# Patient Record
Sex: Female | Born: 1940
Health system: Southern US, Community
[De-identification: ages and names within clinical notes are randomized; demographics above are authoritative.]

## PROBLEM LIST (undated history)

## (undated) DIAGNOSIS — G459 Transient cerebral ischemic attack, unspecified: Secondary | ICD-10-CM

## (undated) DIAGNOSIS — Z7902 Long term (current) use of antithrombotics/antiplatelets: Secondary | ICD-10-CM

## (undated) DIAGNOSIS — Z972 Presence of dental prosthetic device (complete) (partial): Secondary | ICD-10-CM

## (undated) DIAGNOSIS — I5189 Other ill-defined heart diseases: Secondary | ICD-10-CM

## (undated) DIAGNOSIS — I6523 Occlusion and stenosis of bilateral carotid arteries: Secondary | ICD-10-CM

## (undated) DIAGNOSIS — M5136 Other intervertebral disc degeneration, lumbar region: Secondary | ICD-10-CM

## (undated) DIAGNOSIS — R32 Unspecified urinary incontinence: Secondary | ICD-10-CM

## (undated) DIAGNOSIS — L910 Hypertrophic scar: Secondary | ICD-10-CM

## (undated) DIAGNOSIS — T7840XA Allergy, unspecified, initial encounter: Secondary | ICD-10-CM

## (undated) DIAGNOSIS — R7303 Prediabetes: Secondary | ICD-10-CM

## (undated) DIAGNOSIS — I679 Cerebrovascular disease, unspecified: Secondary | ICD-10-CM

## (undated) DIAGNOSIS — E119 Type 2 diabetes mellitus without complications: Secondary | ICD-10-CM

## (undated) DIAGNOSIS — I10 Essential (primary) hypertension: Secondary | ICD-10-CM

## (undated) DIAGNOSIS — G3184 Mild cognitive impairment, so stated: Secondary | ICD-10-CM

## (undated) DIAGNOSIS — F329 Major depressive disorder, single episode, unspecified: Secondary | ICD-10-CM

## (undated) DIAGNOSIS — M5430 Sciatica, unspecified side: Secondary | ICD-10-CM

## (undated) DIAGNOSIS — M51369 Other intervertebral disc degeneration, lumbar region without mention of lumbar back pain or lower extremity pain: Secondary | ICD-10-CM

## (undated) DIAGNOSIS — R001 Bradycardia, unspecified: Secondary | ICD-10-CM

## (undated) DIAGNOSIS — I779 Disorder of arteries and arterioles, unspecified: Secondary | ICD-10-CM

## (undated) DIAGNOSIS — I2510 Atherosclerotic heart disease of native coronary artery without angina pectoris: Secondary | ICD-10-CM

## (undated) DIAGNOSIS — D179 Benign lipomatous neoplasm, unspecified: Secondary | ICD-10-CM

## (undated) DIAGNOSIS — F32A Depression, unspecified: Secondary | ICD-10-CM

## (undated) DIAGNOSIS — I700 Atherosclerosis of aorta: Secondary | ICD-10-CM

## (undated) DIAGNOSIS — I639 Cerebral infarction, unspecified: Secondary | ICD-10-CM

## (undated) DIAGNOSIS — K08109 Complete loss of teeth, unspecified cause, unspecified class: Secondary | ICD-10-CM

## (undated) DIAGNOSIS — E785 Hyperlipidemia, unspecified: Secondary | ICD-10-CM

## (undated) DIAGNOSIS — H25019 Cortical age-related cataract, unspecified eye: Secondary | ICD-10-CM

## (undated) HISTORY — DX: Cerebral infarction, unspecified: I63.9

## (undated) HISTORY — DX: Sciatica, unspecified side: M54.30

## (undated) HISTORY — PX: RECTAL POLYPECTOMY: SHX2309

## (undated) HISTORY — DX: Type 2 diabetes mellitus without complications: E11.9

## (undated) HISTORY — DX: Essential (primary) hypertension: I10

## (undated) HISTORY — DX: Hyperlipidemia, unspecified: E78.5

## (undated) HISTORY — DX: Allergy, unspecified, initial encounter: T78.40XA

## (undated) HISTORY — PX: OTHER SURGICAL HISTORY: SHX169

## (undated) HISTORY — DX: Cerebrovascular disease, unspecified: I67.9

## (undated) HISTORY — DX: Depression, unspecified: F32.A

## (undated) HISTORY — PX: COLONOSCOPY: SHX174

## (undated) HISTORY — DX: Major depressive disorder, single episode, unspecified: F32.9

---

## 1995-10-24 DIAGNOSIS — I639 Cerebral infarction, unspecified: Secondary | ICD-10-CM

## 1995-10-24 HISTORY — DX: Cerebral infarction, unspecified: I63.9

## 2004-12-02 ENCOUNTER — Ambulatory Visit: Payer: Self-pay | Admitting: General Surgery

## 2006-01-04 ENCOUNTER — Ambulatory Visit: Payer: Self-pay | Admitting: General Surgery

## 2007-01-03 ENCOUNTER — Ambulatory Visit: Payer: Self-pay | Admitting: General Surgery

## 2007-04-09 ENCOUNTER — Ambulatory Visit: Payer: Self-pay | Admitting: Gastroenterology

## 2007-08-22 ENCOUNTER — Ambulatory Visit: Payer: Self-pay | Admitting: Family Medicine

## 2007-10-24 HISTORY — PX: GALLBLADDER SURGERY: SHX652

## 2007-10-24 HISTORY — PX: CHOLECYSTECTOMY: SHX55

## 2008-01-09 ENCOUNTER — Ambulatory Visit: Payer: Self-pay | Admitting: General Surgery

## 2008-04-05 ENCOUNTER — Other Ambulatory Visit: Payer: Self-pay

## 2008-04-05 ENCOUNTER — Inpatient Hospital Stay: Payer: Self-pay | Admitting: Vascular Surgery

## 2009-03-17 ENCOUNTER — Ambulatory Visit: Payer: Self-pay | Admitting: Obstetrics and Gynecology

## 2009-03-29 ENCOUNTER — Ambulatory Visit: Payer: Self-pay | Admitting: Obstetrics and Gynecology

## 2010-03-31 ENCOUNTER — Ambulatory Visit: Payer: Self-pay | Admitting: Obstetrics and Gynecology

## 2011-06-24 LAB — HM PAP SMEAR: HM Pap smear: NORMAL

## 2011-06-24 LAB — HM DEXA SCAN: HM DEXA SCAN: NORMAL

## 2011-07-10 ENCOUNTER — Ambulatory Visit: Payer: Self-pay | Admitting: Obstetrics and Gynecology

## 2011-08-01 ENCOUNTER — Ambulatory Visit: Payer: Self-pay | Admitting: Podiatry

## 2011-08-04 ENCOUNTER — Ambulatory Visit: Payer: Self-pay | Admitting: Podiatry

## 2012-07-10 ENCOUNTER — Ambulatory Visit: Payer: Self-pay | Admitting: Obstetrics and Gynecology

## 2013-07-15 ENCOUNTER — Ambulatory Visit: Payer: Self-pay | Admitting: Obstetrics and Gynecology

## 2014-07-07 DIAGNOSIS — H2513 Age-related nuclear cataract, bilateral: Secondary | ICD-10-CM | POA: Insufficient documentation

## 2014-07-22 LAB — LIPID PANEL
Cholesterol: 168 mg/dL (ref 0–200)
HDL: 54 mg/dL (ref 35–70)
LDL Cholesterol: 99 mg/dL
Triglycerides: 74 mg/dL (ref 40–160)

## 2014-07-22 LAB — BASIC METABOLIC PANEL WITH GFR
Creatinine: 1.2 mg/dL — AB (ref <=1.1)
Glucose: 120 mg/dL

## 2014-07-29 ENCOUNTER — Ambulatory Visit: Payer: Self-pay | Admitting: Family Medicine

## 2014-07-29 LAB — HM MAMMOGRAPHY: HM Mammogram: NORMAL

## 2014-09-04 ENCOUNTER — Ambulatory Visit: Payer: Self-pay | Admitting: Gastroenterology

## 2014-10-21 DIAGNOSIS — F32A Depression, unspecified: Secondary | ICD-10-CM | POA: Insufficient documentation

## 2014-10-21 DIAGNOSIS — D128 Benign neoplasm of rectum: Secondary | ICD-10-CM | POA: Insufficient documentation

## 2014-10-21 DIAGNOSIS — F329 Major depressive disorder, single episode, unspecified: Secondary | ICD-10-CM | POA: Insufficient documentation

## 2014-11-03 ENCOUNTER — Emergency Department: Payer: Self-pay | Admitting: Internal Medicine

## 2014-11-03 DIAGNOSIS — N6002 Solitary cyst of left breast: Secondary | ICD-10-CM | POA: Diagnosis not present

## 2014-11-03 DIAGNOSIS — Z880 Allergy status to penicillin: Secondary | ICD-10-CM | POA: Diagnosis not present

## 2014-11-03 DIAGNOSIS — B359 Dermatophytosis, unspecified: Secondary | ICD-10-CM | POA: Diagnosis not present

## 2014-11-03 DIAGNOSIS — N63 Unspecified lump in breast: Secondary | ICD-10-CM | POA: Diagnosis not present

## 2014-11-03 DIAGNOSIS — I10 Essential (primary) hypertension: Secondary | ICD-10-CM | POA: Diagnosis not present

## 2014-11-03 DIAGNOSIS — Z79899 Other long term (current) drug therapy: Secondary | ICD-10-CM | POA: Diagnosis not present

## 2014-11-03 DIAGNOSIS — Z7902 Long term (current) use of antithrombotics/antiplatelets: Secondary | ICD-10-CM | POA: Diagnosis not present

## 2014-11-03 DIAGNOSIS — N644 Mastodynia: Secondary | ICD-10-CM | POA: Diagnosis not present

## 2014-11-03 LAB — CBC WITH DIFFERENTIAL/PLATELET
BASOS PCT: 0.7 %
Basophil #: 0.1 10*3/uL (ref 0.0–0.1)
EOS PCT: 1.1 %
Eosinophil #: 0.1 10*3/uL (ref 0.0–0.7)
HCT: 43.8 % (ref 35.0–47.0)
HGB: 14.3 g/dL (ref 12.0–16.0)
LYMPHS ABS: 2.3 10*3/uL (ref 1.0–3.6)
Lymphocyte %: 19 %
MCH: 31.4 pg (ref 26.0–34.0)
MCHC: 32.6 g/dL (ref 32.0–36.0)
MCV: 96 fL (ref 80–100)
MONO ABS: 0.6 x10 3/mm (ref 0.2–0.9)
Monocyte %: 5.3 %
Neutrophil #: 8.9 10*3/uL — ABNORMAL HIGH (ref 1.4–6.5)
Neutrophil %: 73.9 %
PLATELETS: 258 10*3/uL (ref 150–440)
RBC: 4.55 10*6/uL (ref 3.80–5.20)
RDW: 13.7 % (ref 11.5–14.5)
WBC: 12 10*3/uL — ABNORMAL HIGH (ref 3.6–11.0)

## 2014-11-05 DIAGNOSIS — I517 Cardiomegaly: Secondary | ICD-10-CM | POA: Diagnosis not present

## 2014-11-05 DIAGNOSIS — D369 Benign neoplasm, unspecified site: Secondary | ICD-10-CM | POA: Diagnosis not present

## 2014-11-12 DIAGNOSIS — R198 Other specified symptoms and signs involving the digestive system and abdomen: Secondary | ICD-10-CM | POA: Diagnosis not present

## 2014-11-12 DIAGNOSIS — D128 Benign neoplasm of rectum: Secondary | ICD-10-CM | POA: Diagnosis not present

## 2014-11-12 DIAGNOSIS — D369 Benign neoplasm, unspecified site: Secondary | ICD-10-CM | POA: Diagnosis not present

## 2014-11-14 DIAGNOSIS — I160 Hypertensive urgency: Secondary | ICD-10-CM | POA: Insufficient documentation

## 2014-11-15 DIAGNOSIS — R000 Tachycardia, unspecified: Secondary | ICD-10-CM | POA: Diagnosis not present

## 2014-11-15 DIAGNOSIS — R001 Bradycardia, unspecified: Secondary | ICD-10-CM | POA: Diagnosis not present

## 2014-11-16 DIAGNOSIS — I499 Cardiac arrhythmia, unspecified: Secondary | ICD-10-CM | POA: Diagnosis not present

## 2014-11-17 DIAGNOSIS — N179 Acute kidney failure, unspecified: Secondary | ICD-10-CM | POA: Diagnosis not present

## 2014-11-19 DIAGNOSIS — I10 Essential (primary) hypertension: Secondary | ICD-10-CM | POA: Diagnosis not present

## 2014-11-29 DIAGNOSIS — Z8673 Personal history of transient ischemic attack (TIA), and cerebral infarction without residual deficits: Secondary | ICD-10-CM | POA: Diagnosis not present

## 2014-11-29 DIAGNOSIS — F329 Major depressive disorder, single episode, unspecified: Secondary | ICD-10-CM | POA: Diagnosis not present

## 2014-11-29 DIAGNOSIS — Z9049 Acquired absence of other specified parts of digestive tract: Secondary | ICD-10-CM | POA: Diagnosis not present

## 2014-11-29 DIAGNOSIS — I10 Essential (primary) hypertension: Secondary | ICD-10-CM | POA: Diagnosis not present

## 2014-11-29 DIAGNOSIS — I4940 Unspecified premature depolarization: Secondary | ICD-10-CM | POA: Diagnosis not present

## 2014-11-29 DIAGNOSIS — K91840 Postprocedural hemorrhage and hematoma of a digestive system organ or structure following a digestive system procedure: Secondary | ICD-10-CM | POA: Diagnosis not present

## 2014-11-29 DIAGNOSIS — Z87891 Personal history of nicotine dependence: Secondary | ICD-10-CM | POA: Diagnosis not present

## 2014-11-29 DIAGNOSIS — E669 Obesity, unspecified: Secondary | ICD-10-CM | POA: Diagnosis not present

## 2014-11-29 DIAGNOSIS — K625 Hemorrhage of anus and rectum: Secondary | ICD-10-CM | POA: Diagnosis not present

## 2014-11-29 DIAGNOSIS — T45525A Adverse effect of antithrombotic drugs, initial encounter: Secondary | ICD-10-CM | POA: Diagnosis not present

## 2014-11-29 DIAGNOSIS — K922 Gastrointestinal hemorrhage, unspecified: Secondary | ICD-10-CM | POA: Diagnosis not present

## 2014-12-08 DIAGNOSIS — D128 Benign neoplasm of rectum: Secondary | ICD-10-CM | POA: Diagnosis not present

## 2014-12-09 DIAGNOSIS — I10 Essential (primary) hypertension: Secondary | ICD-10-CM | POA: Diagnosis not present

## 2014-12-15 ENCOUNTER — Ambulatory Visit: Payer: Self-pay | Admitting: Family Medicine

## 2014-12-15 DIAGNOSIS — N63 Unspecified lump in breast: Secondary | ICD-10-CM | POA: Diagnosis not present

## 2014-12-15 DIAGNOSIS — N644 Mastodynia: Secondary | ICD-10-CM | POA: Diagnosis not present

## 2014-12-15 DIAGNOSIS — N6002 Solitary cyst of left breast: Secondary | ICD-10-CM | POA: Diagnosis not present

## 2014-12-16 DIAGNOSIS — I10 Essential (primary) hypertension: Secondary | ICD-10-CM | POA: Diagnosis not present

## 2014-12-18 DIAGNOSIS — Z23 Encounter for immunization: Secondary | ICD-10-CM | POA: Diagnosis not present

## 2014-12-22 DIAGNOSIS — D128 Benign neoplasm of rectum: Secondary | ICD-10-CM | POA: Diagnosis not present

## 2015-02-16 DIAGNOSIS — F339 Major depressive disorder, recurrent, unspecified: Secondary | ICD-10-CM | POA: Insufficient documentation

## 2015-02-16 DIAGNOSIS — Z8679 Personal history of other diseases of the circulatory system: Secondary | ICD-10-CM | POA: Insufficient documentation

## 2015-02-16 DIAGNOSIS — F410 Panic disorder [episodic paroxysmal anxiety] without agoraphobia: Secondary | ICD-10-CM | POA: Insufficient documentation

## 2015-02-16 DIAGNOSIS — E7849 Other hyperlipidemia: Secondary | ICD-10-CM | POA: Insufficient documentation

## 2015-02-16 DIAGNOSIS — I10 Essential (primary) hypertension: Secondary | ICD-10-CM | POA: Insufficient documentation

## 2015-03-17 DIAGNOSIS — I10 Essential (primary) hypertension: Secondary | ICD-10-CM | POA: Diagnosis not present

## 2015-03-17 DIAGNOSIS — F339 Major depressive disorder, recurrent, unspecified: Secondary | ICD-10-CM | POA: Diagnosis not present

## 2015-03-17 DIAGNOSIS — I672 Cerebral atherosclerosis: Secondary | ICD-10-CM | POA: Diagnosis not present

## 2015-03-17 DIAGNOSIS — E784 Other hyperlipidemia: Secondary | ICD-10-CM | POA: Diagnosis not present

## 2015-07-23 ENCOUNTER — Ambulatory Visit (INDEPENDENT_AMBULATORY_CARE_PROVIDER_SITE_OTHER): Payer: Commercial Managed Care - HMO | Admitting: Family Medicine

## 2015-07-23 ENCOUNTER — Encounter: Payer: Self-pay | Admitting: Family Medicine

## 2015-07-23 VITALS — BP 120/70 | HR 64 | Ht 67.0 in | Wt 205.0 lb

## 2015-07-23 DIAGNOSIS — E663 Overweight: Secondary | ICD-10-CM

## 2015-07-23 DIAGNOSIS — I679 Cerebrovascular disease, unspecified: Secondary | ICD-10-CM | POA: Diagnosis not present

## 2015-07-23 DIAGNOSIS — I10 Essential (primary) hypertension: Secondary | ICD-10-CM | POA: Diagnosis not present

## 2015-07-23 DIAGNOSIS — Z23 Encounter for immunization: Secondary | ICD-10-CM | POA: Diagnosis not present

## 2015-07-23 DIAGNOSIS — E7849 Other hyperlipidemia: Secondary | ICD-10-CM

## 2015-07-23 DIAGNOSIS — F339 Major depressive disorder, recurrent, unspecified: Secondary | ICD-10-CM

## 2015-07-23 DIAGNOSIS — I6789 Other cerebrovascular disease: Secondary | ICD-10-CM | POA: Insufficient documentation

## 2015-07-23 DIAGNOSIS — E784 Other hyperlipidemia: Secondary | ICD-10-CM

## 2015-07-23 DIAGNOSIS — Z8679 Personal history of other diseases of the circulatory system: Secondary | ICD-10-CM | POA: Diagnosis not present

## 2015-07-23 MED ORDER — CLOPIDOGREL BISULFATE 75 MG PO TABS: 75.0000 mg | ORAL_TABLET | Freq: Every day | ORAL | Status: DC

## 2015-07-23 MED ORDER — FEXOFENADINE-PSEUDOEPHED ER 180-240 MG PO TB24: 1.0000 | ORAL_TABLET | Freq: Every day | ORAL | Status: DC

## 2015-07-23 MED ORDER — LOSARTAN POTASSIUM-HCTZ 100-25 MG PO TABS: 1.0000 | ORAL_TABLET | Freq: Every day | ORAL | Status: DC

## 2015-07-23 MED ORDER — METOPROLOL SUCCINATE ER 100 MG PO TB24: 100.0000 mg | ORAL_TABLET | Freq: Every day | ORAL | Status: DC

## 2015-07-23 MED ORDER — LOVASTATIN 20 MG PO TABS: 40.0000 mg | ORAL_TABLET | Freq: Every day | ORAL | Status: DC

## 2015-07-23 MED ORDER — VENLAFAXINE HCL ER 75 MG PO CP24: 75.0000 mg | ORAL_CAPSULE | Freq: Every day | ORAL | Status: DC

## 2015-07-23 NOTE — Patient Instructions (Signed)

## 2015-07-23 NOTE — Progress Notes (Signed)
Name: Karina Leonard   MRN: [id number]    DOB: 12/12/1940   Date:07/23/2015       Progress Note  Subjective  Chief Complaint  Chief Complaint  Patient presents with  . Hyperlipidemia  . Hypertension  . Depression  . Cerebrovascular Accident    takes Plavix for this    Hyperlipidemia This is a chronic problem. The current episode started more than 1 year ago. She has no history of chronic renal disease, diabetes, hypothyroidism, liver disease or obesity. Factors aggravating her hyperlipidemia include thiazides. Pertinent negatives include no chest pain, focal sensory loss, focal weakness, leg pain, myalgias or shortness of breath. Current antihyperlipidemic treatment includes statins and diet change. There are no compliance problems.  Risk factors for coronary artery disease include hypertension, post-menopausal and obesity.  Hypertension This is a chronic problem. The current episode started more than 1 year ago. The problem has been gradually worsening since onset. The problem is controlled. Associated symptoms include anxiety. Pertinent negatives include no blurred vision, chest pain, headaches, malaise/fatigue, neck pain, palpitations or shortness of breath. Risk factors for coronary artery disease include dyslipidemia. Past treatments include angiotensin blockers, beta blockers and diuretics. The current treatment provides moderate improvement. There are no compliance problems.  Hypertensive end-organ damage includes CVA. There is no history of angina, kidney disease, CAD/MI, heart failure, left ventricular hypertrophy, PVD or renovascular disease. There is no history of chronic renal disease.  Depression        This is a chronic problem.  The current episode started more than 1 year ago.   The onset quality is gradual.   The problem has been gradually improving since onset.  Associated symptoms include no decreased concentration, does not have insomnia, no decreased interest, no myalgias, no  headaches, not sad and no suicidal ideas.     The symptoms are aggravated by nothing.  Past treatments include SNRIs - Serotonin and norepinephrine reuptake inhibitors.  Compliance with treatment is good.  Past medical history includes anxiety.     Pertinent negatives include no chronic pain, no fibromyalgia, no hypothyroidism and no dementia. Cerebrovascular Accident This is a chronic problem. The current episode started more than 1 year ago. The problem has been gradually improving. Pertinent negatives include no abdominal pain, chest pain, chills, coughing, fever, headaches, myalgias, nausea, neck pain, numbness, rash or sore throat. The treatment provided mild relief.    No problem-specific assessment & plan notes found for this encounter.   Past Medical History  Diagnosis Date  . Depression   . Hyperlipidemia   . Hypertension   . Stroke   . Allergy     Past Surgical History  Procedure Laterality Date  . Gallbladder surgery  10/24/2007  . Polyp removed      rectum- benign    Family History  Problem Relation Age of Onset  . Cancer Mother   . Cancer Father     Social History   Social History  . Marital Status: Married    Spouse Name: N/A  . Number of Children: N/A  . Years of Education: N/A   Occupational History  . Not on file.   Social History Main Topics  . Smoking status: Former Research scientist (life sciences)  . Smokeless tobacco: Not on file  . Alcohol Use: 0.0 oz/week    0 Standard drinks or equivalent per week  . Drug Use: No  . Sexual Activity: No   Other Topics Concern  . Not on file   Social History Narrative  Allergies  Allergen Reactions  . Amoxicillin Hives  . Penicillins      Review of Systems  Constitutional: Negative for fever, chills, weight loss and malaise/fatigue.  HENT: Negative for ear discharge, ear pain and sore throat.   Eyes: Negative for blurred vision.  Respiratory: Negative for cough, sputum production, shortness of breath and wheezing.    Cardiovascular: Negative for chest pain, palpitations and leg swelling.  Gastrointestinal: Negative for heartburn, nausea, abdominal pain, diarrhea, constipation, blood in stool and melena.  Genitourinary: Negative for dysuria, urgency, frequency and hematuria.  Musculoskeletal: Negative for myalgias, back pain, joint pain and neck pain.  Skin: Negative for rash.  Neurological: Negative for dizziness, tingling, sensory change, focal weakness, numbness and headaches.  Endo/Heme/Allergies: Negative for environmental allergies and polydipsia. Does not bruise/bleed easily.  Psychiatric/Behavioral: Positive for depression. Negative for suicidal ideas and decreased concentration. The patient is not nervous/anxious and does not have insomnia.      Objective  Filed Vitals:   07/23/15 0946  BP: 120/70  Pulse: 64  Height: 5\' 7"  (1.702 m)  Weight: 205 lb (92.987 kg)    Physical Exam  Constitutional: She is well-developed, well-nourished, and in no distress. No distress.  HENT:  Head: Normocephalic and atraumatic.  Right Ear: External ear normal.  Left Ear: External ear normal.  Nose: Nose normal.  Mouth/Throat: Oropharynx is clear and moist.  Eyes: Conjunctivae and EOM are normal. Pupils are equal, round, and reactive to light. Right eye exhibits no discharge. Left eye exhibits no discharge.  Neck: Normal range of motion. Neck supple. No JVD present. No thyromegaly present.  Cardiovascular: Normal rate, regular rhythm, normal heart sounds and intact distal pulses.  Exam reveals no gallop and no friction rub.   No murmur heard. Pulmonary/Chest: Effort normal and breath sounds normal.  Abdominal: Soft. Bowel sounds are normal. She exhibits no mass. There is no tenderness. There is no guarding.  Musculoskeletal: Normal range of motion. She exhibits no edema.  Lymphadenopathy:    She has no cervical adenopathy.  Neurological: She is alert.  Skin: Skin is warm and dry. She is not  diaphoretic.  Psychiatric: Mood and affect normal.      Assessment & Plan  Problem List Items Addressed This Visit      Cardiovascular and Mediastinum   Essential (primary) hypertension - Primary   Relevant Medications   losartan-hydrochlorothiazide (HYZAAR) 100-25 MG tablet   lovastatin (MEVACOR) 20 MG tablet   metoprolol succinate (TOPROL-XL) 100 MG 24 hr tablet   Other Relevant Orders   Renal Function Panel   Cerebral microvascular disease   Relevant Medications   clopidogrel (PLAVIX) 75 MG tablet   losartan-hydrochlorothiazide (HYZAAR) 100-25 MG tablet   lovastatin (MEVACOR) 20 MG tablet   metoprolol succinate (TOPROL-XL) 100 MG 24 hr tablet     Other   Familial multiple lipoprotein-type hyperlipidemia   Relevant Medications   losartan-hydrochlorothiazide (HYZAAR) 100-25 MG tablet   lovastatin (MEVACOR) 20 MG tablet   metoprolol succinate (TOPROL-XL) 100 MG 24 hr tablet   Other Relevant Orders   Lipid Profile   Recurrent major depressive episodes   Relevant Medications   venlafaxine XR (EFFEXOR-XR) 75 MG 24 hr capsule   H/O: HTN (hypertension)   Relevant Medications   losartan-hydrochlorothiazide (HYZAAR) 100-25 MG tablet   metoprolol succinate (TOPROL-XL) 100 MG 24 hr tablet    Other Visit Diagnoses    Overweight        Need for influenza vaccination  Relevant Orders    Flu Vaccine QUAD 36+ mos PF IM (Fluarix & Fluzone Quad PF) (Completed)    Need for Tdap vaccination        Relevant Orders    Tdap vaccine greater than or equal to 7yo IM (Completed)         Dr. [facility]  07/23/2015

## 2015-07-24 LAB — LIPID PANEL
CHOL/HDL RATIO: 3.1 ratio (ref 0.0–4.4)
Cholesterol, Total: 167 mg/dL (ref 100–199)
HDL: 54 mg/dL (ref >39)
LDL CALC: 93 mg/dL (ref 0–99)
Triglycerides: 102 mg/dL (ref 0–149)
VLDL CHOLESTEROL CAL: 20 mg/dL (ref 5–40)

## 2015-07-24 LAB — RENAL FUNCTION PANEL
ALBUMIN: 3.7 g/dL (ref 3.5–4.8)
BUN / CREAT RATIO: 15 (ref 11–26)
BUN: 19 mg/dL (ref 8–27)
CO2: 26 mmol/L (ref 18–29)
Calcium: 8.4 mg/dL — ABNORMAL LOW (ref 8.7–10.3)
Chloride: 101 mmol/L (ref 97–108)
Creatinine, Ser: 1.24 mg/dL — ABNORMAL HIGH (ref 0.57–1.00)
GFR, EST AFRICAN AMERICAN: 50 mL/min/{1.73_m2} — AB (ref >59)
GFR, EST NON AFRICAN AMERICAN: 43 mL/min/{1.73_m2} — AB (ref >59)
Glucose: 79 mg/dL (ref 65–99)
Phosphorus: 3.3 mg/dL (ref 2.5–4.5)
Potassium: 4.3 mmol/L (ref 3.5–5.2)
Sodium: 143 mmol/L (ref 134–144)

## 2015-07-26 ENCOUNTER — Encounter: Payer: Self-pay | Admitting: Family Medicine

## 2015-07-26 DIAGNOSIS — E1121 Type 2 diabetes mellitus with diabetic nephropathy: Secondary | ICD-10-CM | POA: Insufficient documentation

## 2015-12-22 ENCOUNTER — Other Ambulatory Visit: Payer: Self-pay | Admitting: Family Medicine

## 2016-01-19 ENCOUNTER — Encounter: Payer: Self-pay | Admitting: Family Medicine

## 2016-01-19 ENCOUNTER — Ambulatory Visit (INDEPENDENT_AMBULATORY_CARE_PROVIDER_SITE_OTHER): Payer: Commercial Managed Care - HMO | Admitting: Family Medicine

## 2016-01-19 VITALS — BP 138/80 | HR 76 | Ht 67.0 in | Wt 214.0 lb

## 2016-01-19 DIAGNOSIS — E784 Other hyperlipidemia: Secondary | ICD-10-CM

## 2016-01-19 DIAGNOSIS — I679 Cerebrovascular disease, unspecified: Secondary | ICD-10-CM | POA: Diagnosis not present

## 2016-01-19 DIAGNOSIS — F339 Major depressive disorder, recurrent, unspecified: Secondary | ICD-10-CM | POA: Diagnosis not present

## 2016-01-19 DIAGNOSIS — I10 Essential (primary) hypertension: Secondary | ICD-10-CM

## 2016-01-19 DIAGNOSIS — E7849 Other hyperlipidemia: Secondary | ICD-10-CM

## 2016-01-19 DIAGNOSIS — I6789 Other cerebrovascular disease: Secondary | ICD-10-CM

## 2016-01-19 DIAGNOSIS — Z8679 Personal history of other diseases of the circulatory system: Secondary | ICD-10-CM

## 2016-01-19 MED ORDER — METOPROLOL SUCCINATE ER 100 MG PO TB24: 100.0000 mg | ORAL_TABLET | Freq: Every day | ORAL | Status: DC

## 2016-01-19 MED ORDER — VENLAFAXINE HCL ER 75 MG PO CP24: 75.0000 mg | ORAL_CAPSULE | Freq: Every day | ORAL | Status: DC

## 2016-01-19 MED ORDER — CLOPIDOGREL BISULFATE 75 MG PO TABS: 75.0000 mg | ORAL_TABLET | Freq: Every day | ORAL | Status: DC

## 2016-01-19 MED ORDER — LOVASTATIN 20 MG PO TABS: 40.0000 mg | ORAL_TABLET | Freq: Every day | ORAL | Status: DC

## 2016-01-19 MED ORDER — LOSARTAN POTASSIUM-HCTZ 100-25 MG PO TABS: 1.0000 | ORAL_TABLET | Freq: Every day | ORAL | Status: DC

## 2016-01-19 NOTE — Progress Notes (Signed)
Patient: Karina Leonard, Female    DOB: 1941/09/05, 75 y.o.   MRN: [id number] Visit Date: 01/19/2016  Today's Provider: Otilio Miu, MD   Chief Complaint  Patient presents with  . medicare annual wellness  . Depression  . Hyperlipidemia  . Hypertension   Subjective:   Initial preventative physical exam Karina Leonard is a 75 y.o. female who presents today for her Initial Preventative Physical Exam. She feels well. She reports exercising .Marland Kitchen She reports she is sleeping well.  Depression        The patient presents with no depression.  This is a chronic problem.  The current episode started more than 1 year ago.   The problem occurs intermittently.  The problem has been gradually improving since onset.  Associated symptoms include no decreased concentration, no fatigue, no helplessness, no hopelessness, does not have insomnia, not irritable, no restlessness, no decreased interest, no appetite change, no body aches, no myalgias, no headaches, no indigestion, not sad and no suicidal ideas.     The symptoms are aggravated by nothing.  Compliance with treatment is good.  Previous treatment provided mild relief.   Pertinent negatives include no hypothyroidism, no anxiety and no depression.  (hx of cva) Hyperlipidemia This is a chronic problem. The current episode started more than 1 year ago. The problem is controlled. Recent lipid tests were reviewed and are normal. She has no history of chronic renal disease, diabetes, hypothyroidism, liver disease, obesity or nephrotic syndrome. Factors aggravating her hyperlipidemia include thiazides. Pertinent negatives include no myalgias or shortness of breath. Current antihyperlipidemic treatment includes statins. The current treatment provides mild improvement of lipids. There are no compliance problems.  There are no known risk factors for coronary artery disease.  Hypertension This is a chronic problem. The current episode started more than 1 year ago. The  problem is unchanged. Pertinent negatives include no anxiety, blurred vision, headaches, palpitations or shortness of breath. Past treatments include diuretics, beta blockers and angiotensin blockers. Hypertensive end-organ damage includes CVA. There is no history of angina, kidney disease, CAD/MI, heart failure, left ventricular hypertrophy, PVD, renovascular disease or retinopathy. There is no history of chronic renal disease or a hypertension causing med.    Review of Systems  Constitutional: Negative.  Negative for fever, chills, appetite change, fatigue and unexpected weight change.  HENT: Negative for congestion, ear discharge, ear pain, rhinorrhea, sinus pressure, sneezing and sore throat.   Eyes: Negative for blurred vision, photophobia, pain, discharge, redness and itching.  Respiratory: Negative for cough, shortness of breath, wheezing and stridor.   Cardiovascular: Negative for palpitations.  Gastrointestinal: Negative for nausea, vomiting, abdominal pain, diarrhea, constipation and blood in stool.  Endocrine: Negative for cold intolerance, heat intolerance, polydipsia, polyphagia and polyuria.  Genitourinary: Negative for dysuria, urgency, frequency, hematuria, flank pain, vaginal bleeding, vaginal discharge, menstrual problem and pelvic pain.  Musculoskeletal: Negative for myalgias, back pain and arthralgias.  Skin: Negative for rash.  Allergic/Immunologic: Negative for environmental allergies and food allergies.  Neurological: Negative for dizziness, weakness, light-headedness, numbness and headaches.  Hematological: Negative for adenopathy. Does not bruise/bleed easily.  Psychiatric/Behavioral: Positive for depression. Negative for suicidal ideas, dysphoric mood and decreased concentration. The patient is not nervous/anxious and does not have insomnia.     Social History   Social History  . Marital Status: Married    Spouse Name: N/A  . Number of Children: N/A  . Years of  Education: N/A   Occupational History  . Not on file.  Social History Main Topics  . Smoking status: Former Research scientist (life sciences)  . Smokeless tobacco: Not on file  . Alcohol Use: 0.0 oz/week    0 Standard drinks or equivalent per week  . Drug Use: No  . Sexual Activity: No   Other Topics Concern  . Not on file   Social History Narrative    Patient Active Problem List   Diagnosis Date Noted  . Renal disease due to diabetes mellitus ([place]) 07/26/2015  . Cerebral microvascular disease 07/23/2015  . Familial multiple lipoprotein-type hyperlipidemia 02/16/2015  . Anxiety attack 02/16/2015  . Recurrent major depressive episodes ([place]) 02/16/2015  . Essential (primary) hypertension 02/16/2015  . H/O: HTN (hypertension) 02/16/2015    Past Surgical History  Procedure Laterality Date  . Gallbladder surgery  10/24/2007  . Polyp removed      rectum- benign    Her family history includes Cancer in her father and mother.    Previous Medications   FEXOFENADINE-PSEUDOEPHEDRINE (ALLEGRA-D 24) 180-240 MG 24 HR TABLET    Take 1 tablet by mouth daily. otc    Patient Care Team: Juline Patch, MD as PCP - General (Family Medicine)     Objective:   Vitals: BP 138/80 mmHg  Pulse 76  Ht 5\' 7"  (1.702 m)  Wt 214 lb (97.07 kg)  BMI 33.51 kg/m2  Physical Exam  Constitutional: She is oriented to person, place, and time. She appears well-developed and well-nourished. She is not irritable.  HENT:  Head: Normocephalic.  Right Ear: External ear normal.  Left Ear: External ear normal.  Mouth/Throat: Oropharynx is clear and moist.  Eyes: Conjunctivae and EOM are normal. Pupils are equal, round, and reactive to light. Lids are everted and swept, no foreign bodies found. Left eye exhibits no hordeolum. No foreign body present in the left eye. Right conjunctiva is not injected. Left conjunctiva is not injected. No scleral icterus.  Neck: Normal range of motion. Neck supple. No JVD present. No tracheal  deviation present. No thyromegaly present.  Cardiovascular: Normal rate, regular rhythm, normal heart sounds and intact distal pulses.  Exam reveals no gallop and no friction rub.   No murmur heard. Pulmonary/Chest: Effort normal and breath sounds normal. No respiratory distress. She has no wheezes. She has no rales.  Abdominal: Soft. Bowel sounds are normal. She exhibits no mass. There is no hepatosplenomegaly. There is no tenderness. There is no rebound and no guarding.  Musculoskeletal: Normal range of motion. She exhibits no edema or tenderness.  Lymphadenopathy:    She has no cervical adenopathy.  Neurological: She is alert and oriented to person, place, and time. She has normal strength. She displays normal reflexes. No cranial nerve deficit.  Skin: Skin is warm. No rash noted.  Psychiatric: She has a normal mood and affect. Her mood appears not anxious. She does not exhibit a depressed mood.  Nursing note and vitals reviewed.    No exam data present  Activities of Daily Living In your present state of health, do you have any difficulty performing the following activities: 01/19/2016 07/23/2015  Hearing? N N  Vision? N N  Difficulty concentrating or making decisions? N N  Walking or climbing stairs? N N  Dressing or bathing? N N  Doing errands, shopping? N N    Fall Risk Assessment Fall Risk  01/19/2016 07/23/2015  Falls in the past year? No No     Patient reports there are not safety devices in place in shower at home.   Depression Screen  PHQ 2/9 Scores 01/19/2016 07/23/2015  PHQ - 2 Score 0 0    Cognitive Testing - 6-CIT   Correct? Score   What year is it? yes 0 Yes = 0    No = 4  What month is it? yes 0 Yes = 0    No = 3  Remember:     [address]ia [address], [place] [place] [address], [place]     What time is it? yes 0 Yes = 0    No = 3  Count backwards from 20 to 1 yes 0 Correct = 0    1 error = 2   More than 1 error = 4  Say the months of the year in reverse. yes 0 Correct = 0    1  error = 2   More than 1 error = 4  What address did I ask you to remember? yes 0 Correct = 0  1 error = 2    2 error = 4    3 error = 6    4 error = 8    All wrong = 10       TOTAL SCORE  0/28   Interpretation:  Normal  Normal (0-7) Abnormal (8-28)     Assessment & Plan:     Initial Preventative Physical Exam  Reviewed patient's Family Medical History Reviewed and updated list of patient's medical providers Assessment of cognitive impairment was done Assessed patient's functional ability Established a written schedule for health screening [place] Completed and Reviewed  Exercise Activities and Dietary recommendations Goals    None      Immunization History  Administered Date(s) Administered  . Influenza,inj,Quad PF,36+ Mos 07/23/2015  . Influenza-Unspecified 07/21/2014  . Pneumococcal Conjugate-13 12/18/2014  . Pneumococcal Polysaccharide-23 01/20/2013  . Tdap 07/23/2015  . Zoster 10/23/2013    Health Maintenance  Topic Date Due  . HEMOGLOBIN A1C  02-12-1941  . FOOT EXAM  02/03/1953  . OPHTHALMOLOGY EXAM  02/03/1953  . COLONOSCOPY  02/03/1993  . INFLUENZA VACCINE  05/23/2016  . MAMMOGRAM  07/29/2016  . TETANUS/TDAP  07/22/2025  . DEXA SCAN  Completed  . ZOSTAVAX  Completed  . PNA vac Low Risk Adult  Completed      Discussed health benefits of physical activity, and encouraged her to engage in regular exercise appropriate for her age and condition.    ------------------------------------------------------------------------------------------------------------   Problem List Items Addressed This Visit      Cardiovascular and Mediastinum   Essential (primary) hypertension - Primary   Relevant Medications   losartan-hydrochlorothiazide (HYZAAR) 100-25 MG tablet   lovastatin (MEVACOR) 20 MG tablet   metoprolol succinate (TOPROL-XL) 100 MG 24 hr tablet   Other Relevant Orders   Renal Function Panel   Cerebral microvascular disease    Relevant Medications   clopidogrel (PLAVIX) 75 MG tablet   losartan-hydrochlorothiazide (HYZAAR) 100-25 MG tablet   lovastatin (MEVACOR) 20 MG tablet   metoprolol succinate (TOPROL-XL) 100 MG 24 hr tablet     Other   Familial multiple lipoprotein-type hyperlipidemia   Relevant Medications   losartan-hydrochlorothiazide (HYZAAR) 100-25 MG tablet   lovastatin (MEVACOR) 20 MG tablet   metoprolol succinate (TOPROL-XL) 100 MG 24 hr tablet   Other Relevant Orders   Lipid Profile   Recurrent major depressive episodes (HCC)   Relevant Medications   venlafaxine XR (EFFEXOR-XR) 75 MG 24 hr capsule   H/O: HTN (hypertension)   Relevant Medications   losartan-hydrochlorothiazide (HYZAAR) 100-25 MG tablet  metoprolol succinate (TOPROL-XL) 100 MG 24 hr tablet       Otilio Miu, MD [facility]  01/19/2016

## 2016-01-20 LAB — LIPID PANEL
CHOL/HDL RATIO: 2.8 ratio (ref 0.0–4.4)
Cholesterol, Total: 163 mg/dL (ref 100–199)
HDL: 59 mg/dL (ref >39)
LDL Calculated: 90 mg/dL (ref 0–99)
TRIGLYCERIDES: 72 mg/dL (ref 0–149)
VLDL Cholesterol Cal: 14 mg/dL (ref 5–40)

## 2016-01-20 LAB — RENAL FUNCTION PANEL
Albumin: 4 g/dL (ref 3.5–4.8)
BUN/Creatinine Ratio: 17 (ref 11–26)
BUN: 18 mg/dL (ref 8–27)
CALCIUM: 9 mg/dL (ref 8.7–10.3)
CHLORIDE: 100 mmol/L (ref 96–106)
CO2: 27 mmol/L (ref 18–29)
Creatinine, Ser: 1.05 mg/dL — ABNORMAL HIGH (ref 0.57–1.00)
GFR calc Af Amer: 61 mL/min/{1.73_m2} (ref >59)
GFR, EST NON AFRICAN AMERICAN: 53 mL/min/{1.73_m2} — AB (ref >59)
GLUCOSE: 102 mg/dL — AB (ref 65–99)
PHOSPHORUS: 3.1 mg/dL (ref 2.5–4.5)
POTASSIUM: 4.4 mmol/L (ref 3.5–5.2)
SODIUM: 143 mmol/L (ref 134–144)

## 2016-01-26 IMAGING — US ULTRASOUND LEFT BREAST
2 series · 13 of 25 positions shown · non-contrast
Comparison: 07/15/2013, 07/10/2012, additional prior studies dating
back to 12/01/2002

CLINICAL DATA: 73-year-old female with new area of discoloration on
her lateral left breast at 3-4 o'clock for approximately 5 days. The
patient reports that she takes Plavix and has had similar areas of
bruising in the past after known trauma which have since resolved.
She denies any known trauma to this area. She is referred from the
ER to evaluate for abscess.

EXAM:
ULTRASOUND OF THE LEFT BREAST

[Series 1: ultrasound left breast · 0.10mm/px · 8 of 25 slices shown (1 of 2)]
[im 1/25]
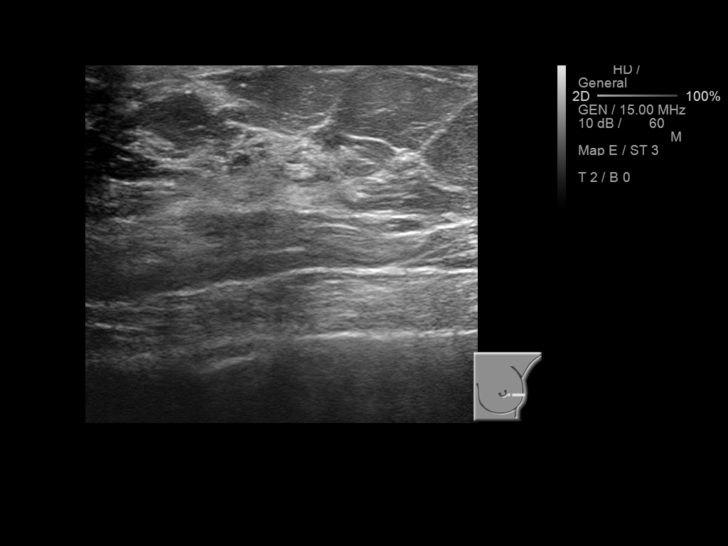
[im 4/25]
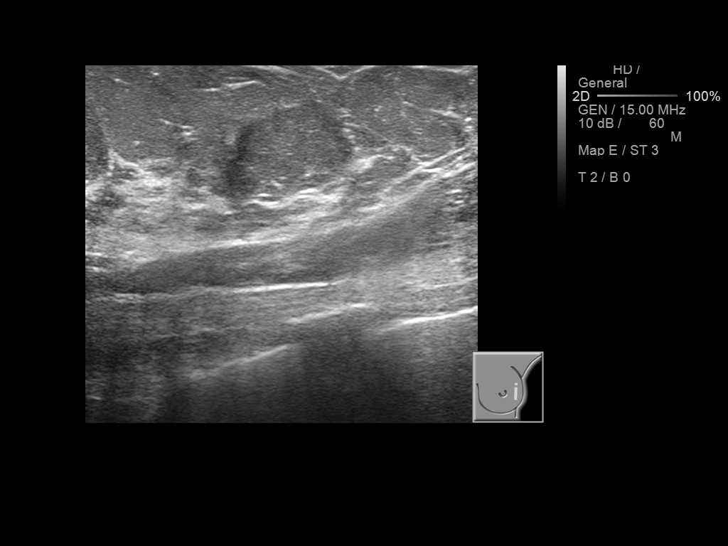
[im 7/25]
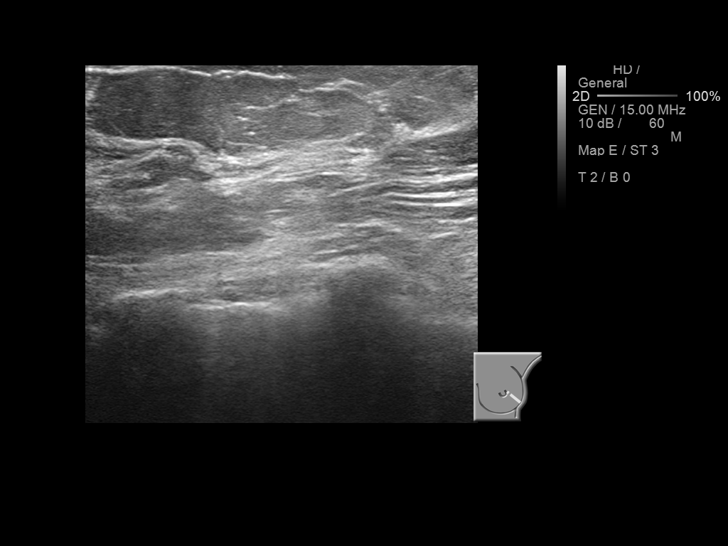
[im 11/25]
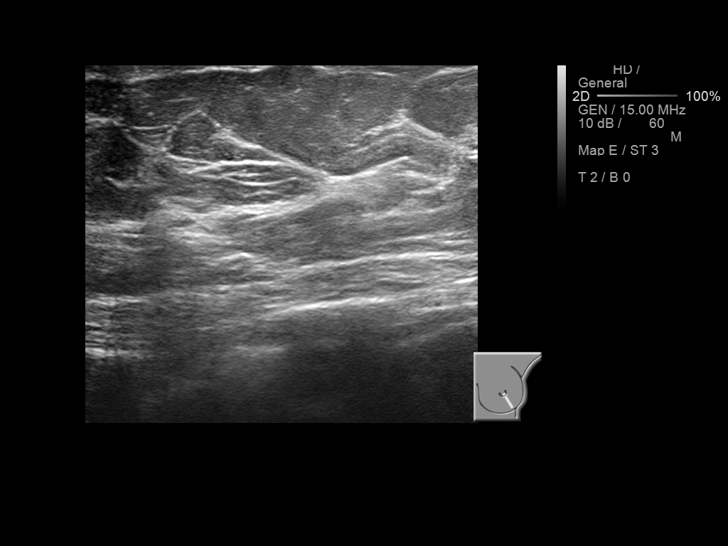
[im 14/25]
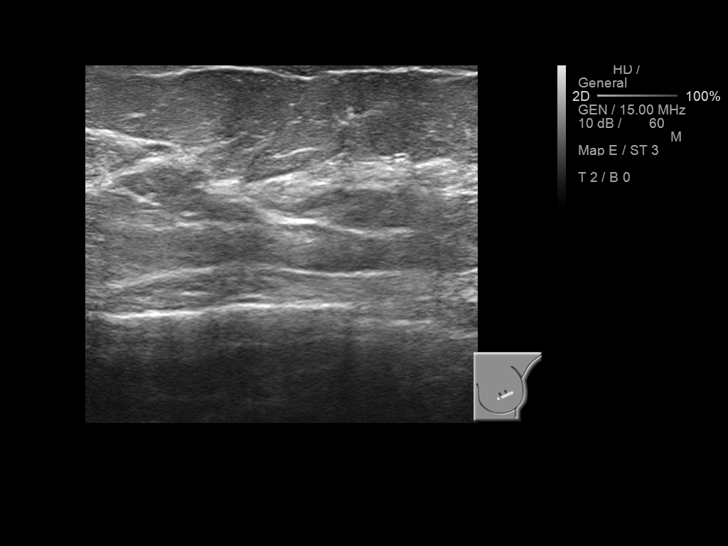
[im 18/25]
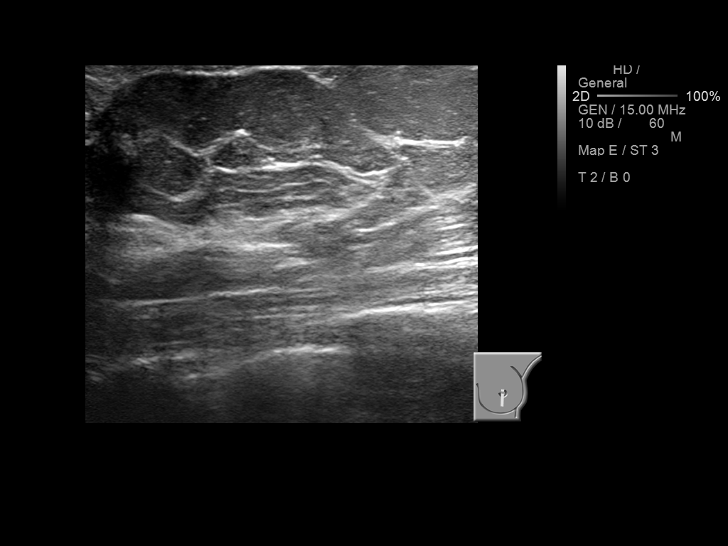
[im 21/25]
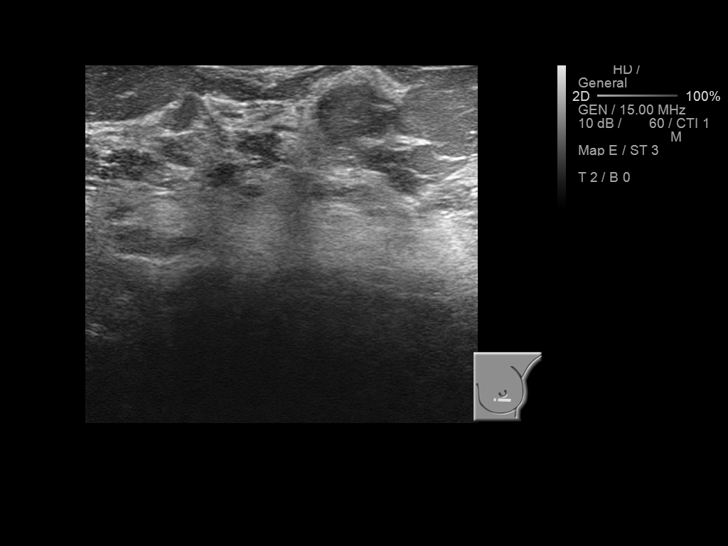
[im 25/25]
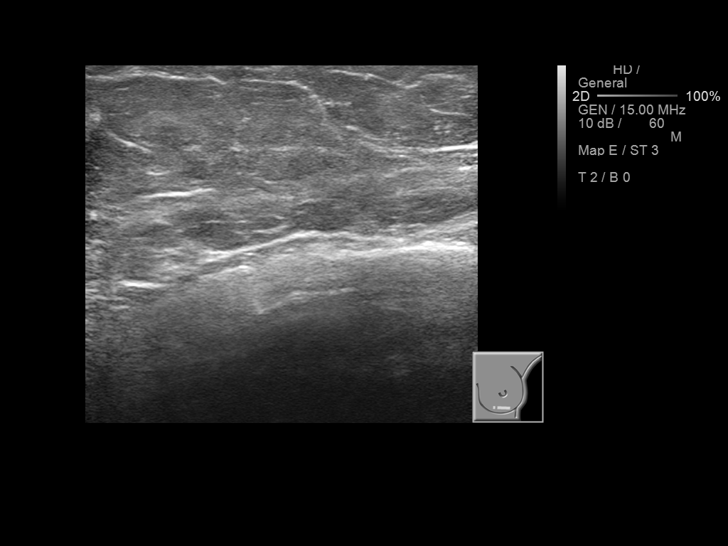

[Series 2: ultrasound left breast · 0.08mm/px · 5 of 18 slices shown (2 of 2)]
[im 2/18]
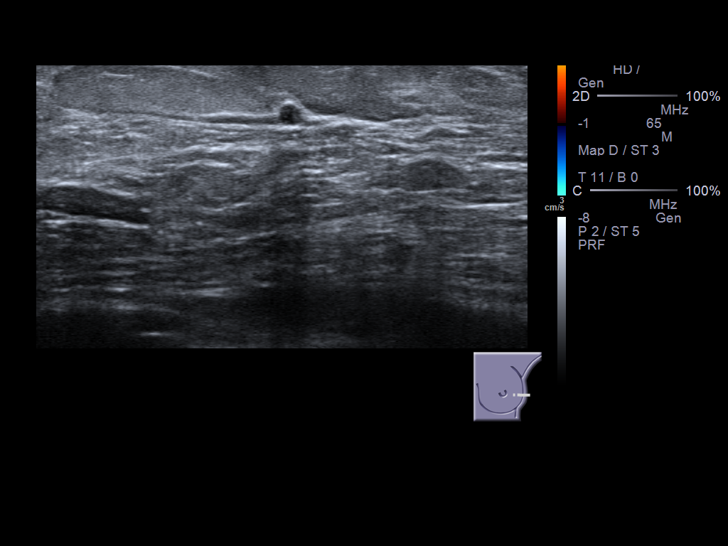
[im 6/18]
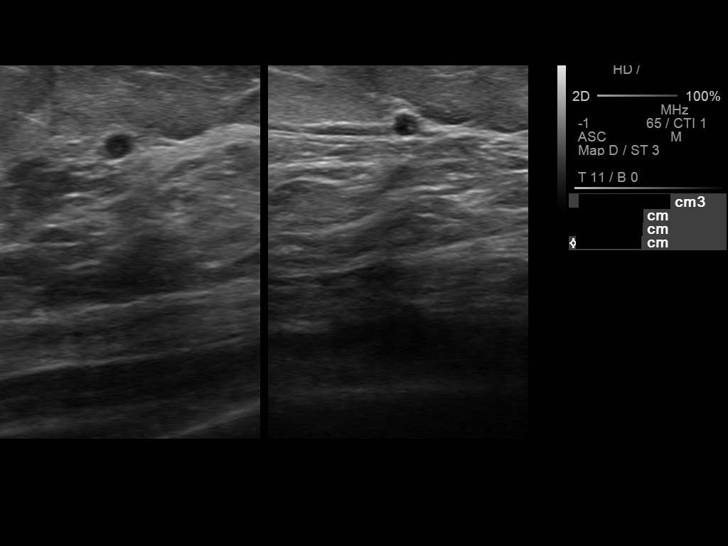
[im 10/18]
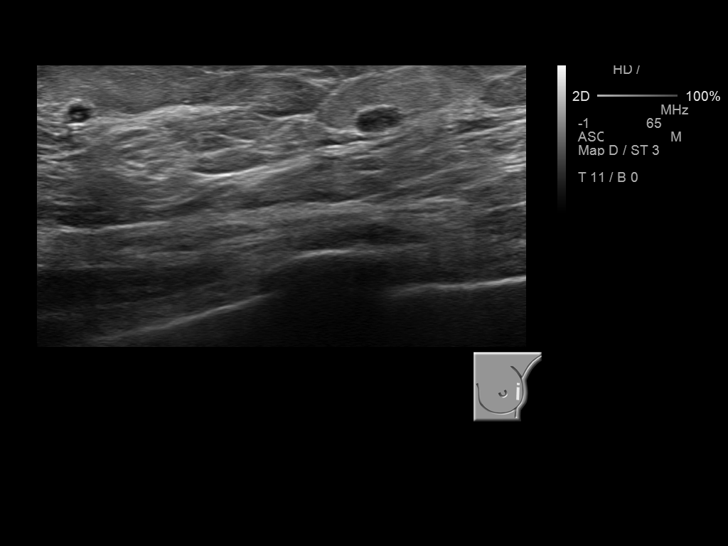
[im 14/18]
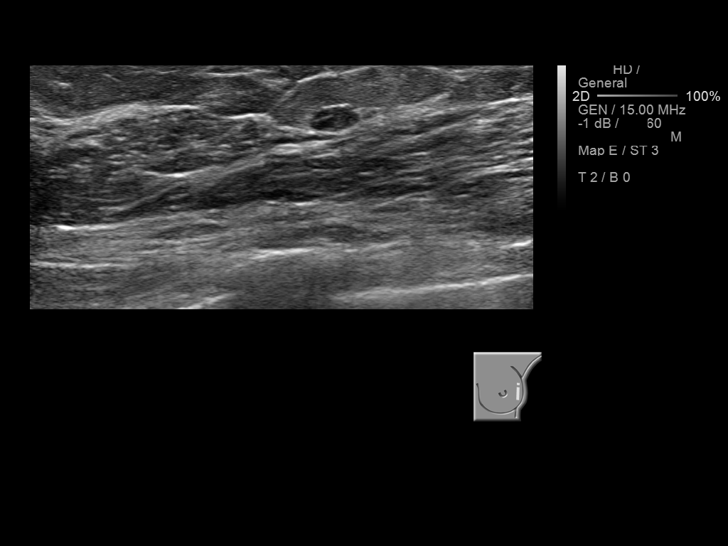
[im 18/18]
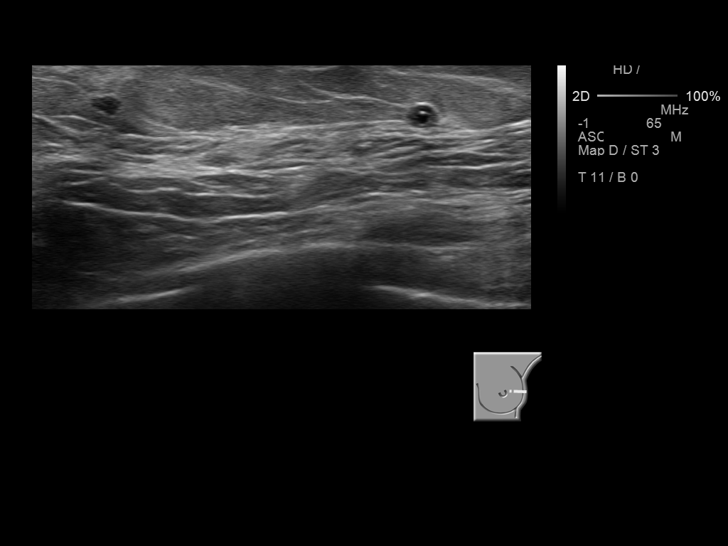

[13 of 25 positions shown; findings below may reference images not displayed]

FINDINGS: On physical exam, there is an area of discoloration on the lateral
breast from 3-4 o'clock. No discrete mass is felt in this area.

Targeted ultrasound of the area of discoloration and tenderness of
the left breast from 3-4 o'clock demonstrates 2 similar-appearing
circumscribed, hypoechoic, oval masses at approximately 3 o'clock, 7
cm from the nipple. The smaller mass measures 3 mm and the larger
mass 6 x 4 x 3 mm. No internal vascularity is identified within
either area. These areas may represent areas of fat necrosis versus
complicated cysts. No abscess or hematoma is seen.
IMPRESSION: Probable bruising of the lateral left breast related to the
patient's Plavix. A mammogram could not be performed today due to
the patient's discomfort.

RECOMMENDATION:
The patient was instructed to return in 6 weeks for left diagnostic
mammogram and follow-up ultrasound.

Findings and recommendations were discussed with Dr. Leabetswe in the
emergency room at the time of dictation.

I have discussed the findings and recommendations with the patient.
Results were also provided in writing at the conclusion of the
visit. If applicable, a reminder letter will be sent to the patient
regarding the next appointment.

BI-RADS CATEGORY  3: Probably benign finding(s) - short interval
follow-up suggested.

## 2016-02-18 ENCOUNTER — Ambulatory Visit (INDEPENDENT_AMBULATORY_CARE_PROVIDER_SITE_OTHER): Payer: Commercial Managed Care - HMO | Admitting: Family Medicine

## 2016-02-18 ENCOUNTER — Encounter: Payer: Self-pay | Admitting: Family Medicine

## 2016-02-18 VITALS — BP 138/80 | HR 64 | Ht 67.0 in | Wt 212.0 lb

## 2016-02-18 DIAGNOSIS — J40 Bronchitis, not specified as acute or chronic: Secondary | ICD-10-CM

## 2016-02-18 DIAGNOSIS — E785 Hyperlipidemia, unspecified: Secondary | ICD-10-CM | POA: Diagnosis not present

## 2016-02-18 DIAGNOSIS — J0100 Acute maxillary sinusitis, unspecified: Secondary | ICD-10-CM

## 2016-02-18 MED ORDER — LOVASTATIN 20 MG PO TABS: 40.0000 mg | ORAL_TABLET | Freq: Every day | ORAL | Status: DC

## 2016-02-18 MED ORDER — GUAIFENESIN-CODEINE 100-10 MG/5ML PO SYRP: 5.0000 mL | ORAL_SOLUTION | Freq: Three times a day (TID) | ORAL | Status: DC | PRN

## 2016-02-18 MED ORDER — AZITHROMYCIN 250 MG PO TABS: ORAL_TABLET | ORAL | Status: DC

## 2016-02-18 NOTE — Progress Notes (Signed)
Name: Karina Leonard   MRN: [id number]    DOB: 02/20/1941   Date:02/18/2016       Progress Note  Subjective  Chief Complaint  Chief Complaint  Patient presents with  . Sinusitis    wants [employer] sent in for cough and cong    Sinusitis This is a chronic problem. The current episode started 1 to 4 weeks ago. The problem is unchanged. There has been no fever. The pain is moderate. Associated symptoms include chills, congestion, coughing, diaphoresis, sinus pressure and sneezing. Pertinent negatives include no ear pain, headaches, hoarse voice, neck pain, shortness of breath, sore throat or swollen glands. Past treatments include acetaminophen. The treatment provided no relief.    No problem-specific assessment & plan notes found for this encounter.   Past Medical History  Diagnosis Date  . Depression   . Hyperlipidemia   . Hypertension   . Stroke ([place])   . Allergy     Past Surgical History  Procedure Laterality Date  . Gallbladder surgery  10/24/2007  . Polyp removed      rectum- benign    Family History  Problem Relation Age of Onset  . Cancer Mother   . Cancer Father     Social History   Social History  . Marital Status: Married    Spouse Name: N/A  . Number of Children: N/A  . Years of Education: N/A   Occupational History  . Not on file.   Social History Main Topics  . Smoking status: Former Research scientist (life sciences)  . Smokeless tobacco: Not on file  . Alcohol Use: 0.0 oz/week    0 Standard drinks or equivalent per week  . Drug Use: No  . Sexual Activity: No   Other Topics Concern  . Not on file   Social History Narrative    Allergies  Allergen Reactions  . Amoxicillin Hives  . Penicillins      Review of Systems  Constitutional: Positive for chills and diaphoresis. Negative for fever, weight loss and malaise/fatigue.  HENT: Positive for congestion, sinus pressure and sneezing. Negative for ear discharge, ear pain, hoarse voice and sore throat.   Eyes: Negative  for blurred vision.  Respiratory: Positive for cough. Negative for sputum production, shortness of breath and wheezing.   Cardiovascular: Negative for chest pain, palpitations and leg swelling.  Gastrointestinal: Negative for heartburn, nausea, abdominal pain, diarrhea, constipation, blood in stool and melena.  Genitourinary: Negative for dysuria, urgency, frequency and hematuria.  Musculoskeletal: Negative for myalgias, back pain, joint pain and neck pain.  Skin: Negative for rash.  Neurological: Negative for dizziness, tingling, sensory change, focal weakness and headaches.  Endo/Heme/Allergies: Negative for environmental allergies and polydipsia. Does not bruise/bleed easily.  Psychiatric/Behavioral: Negative for depression and suicidal ideas. The patient is not nervous/anxious and does not have insomnia.      Objective  Filed Vitals:   02/18/16 1343  BP: 138/80  Pulse: 64  Height: 5\' 7"  (1.702 m)  Weight: 212 lb (96.163 kg)    Physical Exam  Constitutional: She is well-developed, well-nourished, and in no distress. No distress.  HENT:  Head: Normocephalic and atraumatic.  Right Ear: External ear normal.  Left Ear: External ear normal.  Nose: Nose normal.  Mouth/Throat: Oropharynx is clear and moist.  Eyes: Conjunctivae and EOM are normal. Pupils are equal, round, and reactive to light. Right eye exhibits no discharge. Left eye exhibits no discharge.  Neck: Normal range of motion. Neck supple. No JVD present. No thyromegaly present.  Cardiovascular: Normal rate, regular rhythm, normal heart sounds and intact distal pulses.  Exam reveals no gallop and no friction rub.   No murmur heard. Pulmonary/Chest: Effort normal and breath sounds normal.  Abdominal: Soft. Bowel sounds are normal. She exhibits no mass. There is no tenderness. There is no guarding.  Musculoskeletal: Normal range of motion. She exhibits no edema.  Lymphadenopathy:    She has no cervical adenopathy.   Neurological: She is alert. She has normal reflexes.  Skin: Skin is warm and dry. She is not diaphoretic.  Psychiatric: Mood and affect normal.  Nursing note and vitals reviewed.     Assessment & Plan  Problem List Items Addressed This Visit    None    Visit Diagnoses    Acute maxillary sinusitis, recurrence not specified    -  Primary    Relevant Medications    azithromycin (ZITHROMAX) 250 MG tablet    guaiFENesin-codeine (ROBITUSSIN AC) 100-10 MG/5ML syrup    Bronchitis        Relevant Medications    azithromycin (ZITHROMAX) 250 MG tablet    guaiFENesin-codeine (ROBITUSSIN AC) 100-10 MG/5ML syrup    Hyperlipidemia        Relevant Medications    lovastatin (MEVACOR) 20 MG tablet         Dr. Estil Vallee [facility]  02/18/2016

## 2016-05-25 ENCOUNTER — Other Ambulatory Visit: Payer: Self-pay | Admitting: Family Medicine

## 2016-07-20 ENCOUNTER — Ambulatory Visit (INDEPENDENT_AMBULATORY_CARE_PROVIDER_SITE_OTHER): Payer: Commercial Managed Care - HMO | Admitting: Family Medicine

## 2016-07-20 ENCOUNTER — Encounter: Payer: Self-pay | Admitting: Family Medicine

## 2016-07-20 VITALS — BP 122/76 | HR 58 | Ht 67.0 in | Wt 211.0 lb

## 2016-07-20 DIAGNOSIS — I10 Essential (primary) hypertension: Secondary | ICD-10-CM

## 2016-07-20 DIAGNOSIS — I679 Cerebrovascular disease, unspecified: Secondary | ICD-10-CM

## 2016-07-20 DIAGNOSIS — F339 Major depressive disorder, recurrent, unspecified: Secondary | ICD-10-CM

## 2016-07-20 DIAGNOSIS — Z23 Encounter for immunization: Secondary | ICD-10-CM

## 2016-07-20 DIAGNOSIS — E784 Other hyperlipidemia: Secondary | ICD-10-CM

## 2016-07-20 DIAGNOSIS — Z8679 Personal history of other diseases of the circulatory system: Secondary | ICD-10-CM | POA: Diagnosis not present

## 2016-07-20 DIAGNOSIS — M25511 Pain in right shoulder: Secondary | ICD-10-CM

## 2016-07-20 DIAGNOSIS — I6789 Other cerebrovascular disease: Secondary | ICD-10-CM

## 2016-07-20 DIAGNOSIS — E7849 Other hyperlipidemia: Secondary | ICD-10-CM

## 2016-07-20 MED ORDER — CLOPIDOGREL BISULFATE 75 MG PO TABS
75.0000 mg | ORAL_TABLET | Freq: Every day | ORAL | 1 refills | Status: DC
Start: 2016-07-20 — End: 2016-07-29

## 2016-07-20 MED ORDER — LOVASTATIN 20 MG PO TABS: 40.0000 mg | ORAL_TABLET | Freq: Every day | ORAL | 1 refills | Status: DC

## 2016-07-20 MED ORDER — LOSARTAN POTASSIUM-HCTZ 100-25 MG PO TABS: 1.0000 | ORAL_TABLET | Freq: Every day | ORAL | 1 refills | Status: DC

## 2016-07-20 MED ORDER — VENLAFAXINE HCL ER 75 MG PO CP24: 75.0000 mg | ORAL_CAPSULE | Freq: Every day | ORAL | 1 refills | Status: DC

## 2016-07-20 MED ORDER — METOPROLOL SUCCINATE ER 100 MG PO TB24: 100.0000 mg | ORAL_TABLET | Freq: Every day | ORAL | 1 refills | Status: DC

## 2016-07-20 NOTE — Progress Notes (Signed)
Name: Karina Leonard   MRN: [id number]    DOB: [birth year]   Date:07/20/2016       Progress Note  Subjective  Chief Complaint  Chief Complaint  Patient presents with  . Hypertension  . Hyperlipidemia  . Depression  . Cerebrovascular Accident    taking Plavix    Hypertension  This is a chronic problem. The current episode started more than 1 year ago. The problem has been gradually improving since onset. The problem is controlled. Pertinent negatives include no anxiety, blurred vision, chest pain, headaches, malaise/fatigue, neck pain, orthopnea, palpitations, peripheral edema, PND, shortness of breath or sweats. There are no associated agents to hypertension. There are no known risk factors for coronary artery disease. Past treatments include angiotensin blockers, beta blockers and diuretics. The current treatment provides moderate improvement. There are no compliance problems.  There is no history of angina, kidney disease, CAD/MI, CVA, heart failure, left ventricular hypertrophy, PVD, renovascular disease or retinopathy. There is no history of chronic renal disease or a hypertension causing med.  Hyperlipidemia  This is a chronic problem. The current episode started more than 1 year ago. The problem is controlled. Recent lipid tests were reviewed and are normal. She has no history of chronic renal disease. Factors aggravating her hyperlipidemia include thiazides. Pertinent negatives include no chest pain, focal weakness, myalgias or shortness of breath. Current antihyperlipidemic treatment includes statins. The current treatment provides moderate improvement of lipids. There are no compliance problems.  Risk factors for coronary artery disease include obesity and hypertension.  Depression         This is a chronic problem.  The current episode started more than 1 year ago.   The onset quality is gradual.   The problem has been gradually improving since onset.  Associated symptoms include no  decreased concentration, no fatigue, no helplessness, no hopelessness, does not have insomnia, no restlessness, no decreased interest, no appetite change, no body aches, no myalgias, no headaches, no indigestion, not sad and no suicidal ideas.     Exacerbated by: stress.  Past treatments include SNRIs - Serotonin and norepinephrine reuptake inhibitors.  Compliance with treatment is good.  Previous treatment provided mild relief.   Pertinent negatives include no anxiety. Cerebrovascular Accident  This is a chronic (cerebral vascular accident) problem. The current episode started more than 1 year ago. Pertinent negatives include no abdominal pain, chest pain, chills, coughing, fatigue, fever, headaches, myalgias, nausea, neck pain, rash or sore throat. She has tried nothing for the symptoms.    No problem-specific Assessment & Plan notes found for this encounter.   Past Medical History:  Diagnosis Date  . Allergy   . Depression   . Hyperlipidemia   . Hypertension   . Sciatica   . Stroke [facility])     Past Surgical History:  Procedure Laterality Date  . GALLBLADDER SURGERY  10/24/2007  . polyp removed     rectum- benign    Family History  Problem Relation Age of Onset  . Cancer Mother   . Cancer Father     Social History   Social History  . Marital status: Married    Spouse name: N/A  . Number of children: N/A  . Years of education: N/A   Occupational History  . Not on file.   Social History Main Topics  . Smoking status: Former Research scientist (life sciences)  . Smokeless tobacco: Not on file  . Alcohol use 0.0 oz/week  . Drug use: No  . Sexual activity:  No   Other Topics Concern  . Not on file   Social History Narrative  . No narrative on file    Allergies  Allergen Reactions  . Amoxicillin Hives  . Penicillins      Review of Systems  Constitutional: Negative for appetite change, chills, fatigue, fever, malaise/fatigue and weight loss.  HENT: Negative for ear discharge, ear pain and  sore throat.   Eyes: Negative for blurred vision.  Respiratory: Negative for cough, sputum production, shortness of breath and wheezing.   Cardiovascular: Negative for chest pain, palpitations, orthopnea, leg swelling and PND.  Gastrointestinal: Negative for abdominal pain, blood in stool, constipation, diarrhea, heartburn, melena and nausea.  Genitourinary: Negative for dysuria, frequency, hematuria and urgency.  Musculoskeletal: Negative for back pain, joint pain, myalgias and neck pain.  Skin: Negative for rash.  Neurological: Negative for dizziness, tingling, sensory change, focal weakness and headaches.  Endo/Heme/Allergies: Negative for environmental allergies and polydipsia. Does not bruise/bleed easily.  Psychiatric/Behavioral: Positive for depression. Negative for decreased concentration and suicidal ideas. The patient is not nervous/anxious and does not have insomnia.      Objective  Vitals:   07/20/16 1034  BP: 122/76  Pulse: (!) 58  Weight: 211 lb (95.7 kg)  Height: 5\' 7"  (1.702 m)    Physical Exam  Constitutional: She is well-developed, well-nourished, and in no distress. No distress.  HENT:  Head: Normocephalic and atraumatic.  Right Ear: External ear normal.  Left Ear: External ear normal.  Nose: Nose normal.  Mouth/Throat: Oropharynx is clear and moist.  Eyes: Conjunctivae and EOM are normal. Pupils are equal, round, and reactive to light. Right eye exhibits no discharge. Left eye exhibits no discharge.  Neck: Normal range of motion. Neck supple. No JVD present. No thyromegaly present.  Cardiovascular: Normal rate, regular rhythm, normal heart sounds and intact distal pulses.  Exam reveals no gallop and no friction rub.   No murmur heard. Pulmonary/Chest: Effort normal and breath sounds normal. She has no wheezes. She has no rales.  Abdominal: Soft. Bowel sounds are normal. She exhibits no mass. There is no tenderness. There is no guarding.  Musculoskeletal:  Normal range of motion. She exhibits no edema.  Lymphadenopathy:    She has no cervical adenopathy.  Neurological: She is alert. She has normal reflexes.  Skin: Skin is warm and dry. She is not diaphoretic.  Psychiatric: Mood and affect normal.  Nursing note and vitals reviewed.     Assessment & Plan  Problem List Items Addressed This Visit      Cardiovascular and Mediastinum   Essential (primary) hypertension - Primary   Relevant Medications   losartan-hydrochlorothiazide (HYZAAR) 100-25 MG tablet   metoprolol succinate (TOPROL-XL) 100 MG 24 hr tablet   lovastatin (MEVACOR) 20 MG tablet   Other Relevant Orders   Renal Function Panel   Cerebral microvascular disease   Relevant Medications   clopidogrel (PLAVIX) 75 MG tablet   losartan-hydrochlorothiazide (HYZAAR) 100-25 MG tablet   metoprolol succinate (TOPROL-XL) 100 MG 24 hr tablet   lovastatin (MEVACOR) 20 MG tablet     Other   Familial multiple lipoprotein-type hyperlipidemia   Relevant Medications   losartan-hydrochlorothiazide (HYZAAR) 100-25 MG tablet   metoprolol succinate (TOPROL-XL) 100 MG 24 hr tablet   lovastatin (MEVACOR) 20 MG tablet   Other Relevant Orders   Lipid Profile   Recurrent major depressive episodes (HCC)   Relevant Medications   venlafaxine XR (EFFEXOR-XR) 75 MG 24 hr capsule   H/O: HTN (hypertension)  Relevant Medications   losartan-hydrochlorothiazide (HYZAAR) 100-25 MG tablet   metoprolol succinate (TOPROL-XL) 100 MG 24 hr tablet    Other Visit Diagnoses    Immunization due       Relevant Orders   Flu Vaccine QUAD 36+ mos PF IM (Fluarix & Fluzone Quad PF) (Completed)   Shoulder pain, acute, right       Relevant Orders   Ambulatory referral to Orthopedic Surgery     I spent 25 minutes with this patient, More than 50% of that time was spent in face to face education, counseling and care coordination.   Dr. [facility]  07/20/16

## 2016-07-21 LAB — LIPID PANEL
CHOL/HDL RATIO: 2.7 ratio (ref 0.0–4.4)
Cholesterol, Total: 154 mg/dL (ref 100–199)
HDL: 58 mg/dL (ref >39)
LDL CALC: 83 mg/dL (ref 0–99)
TRIGLYCERIDES: 66 mg/dL (ref 0–149)
VLDL Cholesterol Cal: 13 mg/dL (ref 5–40)

## 2016-07-21 LAB — RENAL FUNCTION PANEL
Albumin: 3.7 g/dL (ref 3.5–4.8)
BUN/Creatinine Ratio: 17 (ref 12–28)
BUN: 16 mg/dL (ref 8–27)
CO2: 26 mmol/L (ref 18–29)
CREATININE: 0.96 mg/dL (ref 0.57–1.00)
Calcium: 8.9 mg/dL (ref 8.7–10.3)
Chloride: 103 mmol/L (ref 96–106)
GFR, EST AFRICAN AMERICAN: 68 mL/min/{1.73_m2} (ref >59)
GFR, EST NON AFRICAN AMERICAN: 59 mL/min/{1.73_m2} — AB (ref >59)
GLUCOSE: 102 mg/dL — AB (ref 65–99)
PHOSPHORUS: 3 mg/dL (ref 2.5–4.5)
POTASSIUM: 4.2 mmol/L (ref 3.5–5.2)
SODIUM: 144 mmol/L (ref 134–144)

## 2016-07-29 ENCOUNTER — Other Ambulatory Visit: Payer: Self-pay | Admitting: Family Medicine

## 2016-07-29 DIAGNOSIS — I6789 Other cerebrovascular disease: Secondary | ICD-10-CM

## 2016-07-29 DIAGNOSIS — I679 Cerebrovascular disease, unspecified: Principal | ICD-10-CM

## 2016-08-15 DIAGNOSIS — D1721 Benign lipomatous neoplasm of skin and subcutaneous tissue of right arm: Secondary | ICD-10-CM | POA: Diagnosis not present

## 2016-08-15 DIAGNOSIS — R200 Anesthesia of skin: Secondary | ICD-10-CM | POA: Diagnosis not present

## 2016-08-15 DIAGNOSIS — R202 Paresthesia of skin: Secondary | ICD-10-CM | POA: Diagnosis not present

## 2017-01-18 ENCOUNTER — Ambulatory Visit (INDEPENDENT_AMBULATORY_CARE_PROVIDER_SITE_OTHER): Payer: Medicare HMO | Admitting: Family Medicine

## 2017-01-18 DIAGNOSIS — Z8679 Personal history of other diseases of the circulatory system: Secondary | ICD-10-CM

## 2017-01-18 DIAGNOSIS — I10 Essential (primary) hypertension: Secondary | ICD-10-CM

## 2017-01-18 DIAGNOSIS — E7849 Other hyperlipidemia: Secondary | ICD-10-CM

## 2017-01-18 DIAGNOSIS — E784 Other hyperlipidemia: Secondary | ICD-10-CM

## 2017-01-18 DIAGNOSIS — F339 Major depressive disorder, recurrent, unspecified: Secondary | ICD-10-CM

## 2017-01-18 DIAGNOSIS — I679 Cerebrovascular disease, unspecified: Secondary | ICD-10-CM | POA: Diagnosis not present

## 2017-01-18 DIAGNOSIS — I6789 Other cerebrovascular disease: Secondary | ICD-10-CM

## 2017-01-18 MED ORDER — CLOPIDOGREL BISULFATE 75 MG PO TABS: 75.0000 mg | ORAL_TABLET | Freq: Every day | ORAL | 1 refills | Status: DC

## 2017-01-18 MED ORDER — LOVASTATIN 20 MG PO TABS: 40.0000 mg | ORAL_TABLET | Freq: Every day | ORAL | 1 refills | Status: DC

## 2017-01-18 MED ORDER — METOPROLOL SUCCINATE ER 100 MG PO TB24: 100.0000 mg | ORAL_TABLET | Freq: Every day | ORAL | 1 refills | Status: DC

## 2017-01-18 MED ORDER — VENLAFAXINE HCL ER 75 MG PO CP24: 75.0000 mg | ORAL_CAPSULE | Freq: Every day | ORAL | 1 refills | Status: DC

## 2017-01-18 MED ORDER — LOSARTAN POTASSIUM-HCTZ 100-25 MG PO TABS: 1.0000 | ORAL_TABLET | Freq: Every day | ORAL | 1 refills | Status: DC

## 2017-01-18 NOTE — Progress Notes (Signed)
Name: Karina Leonard   MRN: [id number]    DOB: 10/01/1941   Date:01/18/2017       Progress Note  Subjective  Chief Complaint  Chief Complaint  Patient presents with  . Hypertension  . Hyperlipidemia  . Depression  . Cerebrovascular Accident    takes plavix for    Hypertension  This is a chronic problem. The current episode started more than 1 year ago. The problem has been gradually improving since onset. The problem is controlled. Pertinent negatives include no anxiety, blurred vision, chest pain, headaches, malaise/fatigue, neck pain, orthopnea, palpitations, peripheral edema, PND, shortness of breath or sweats. There are no associated agents to hypertension. There are no known risk factors for coronary artery disease. Past treatments include ACE inhibitors, beta blockers and diuretics. The current treatment provides mild improvement. There are no compliance problems.  There is no history of angina, kidney disease, CAD/MI, CVA, heart failure, left ventricular hypertrophy, PVD or retinopathy. There is no history of chronic renal disease or a hypertension causing med.  Hyperlipidemia  This is a chronic problem. The current episode started more than 1 year ago. The problem is controlled. Recent lipid tests were reviewed and are normal. She has no history of chronic renal disease, hypothyroidism or nephrotic syndrome. Factors aggravating her hyperlipidemia include beta blockers and thiazides. Pertinent negatives include no chest pain, focal weakness, leg pain, myalgias or shortness of breath. Current antihyperlipidemic treatment includes statins. The current treatment provides mild improvement of lipids. There are no compliance problems.  Risk factors for coronary artery disease include dyslipidemia and hypertension.  Depression         The patient presents with no depression.  This is a chronic problem.  The onset quality is gradual.   The problem has been gradually improving since onset.   Associated symptoms include no decreased concentration, no fatigue, no helplessness, no hopelessness, does not have insomnia, not irritable, no restlessness, no decreased interest, no myalgias, no headaches, no indigestion, not sad and no suicidal ideas.     The symptoms are aggravated by nothing.  Past treatments include SNRIs - Serotonin and norepinephrine reuptake inhibitors.  Compliance with treatment is good.  Previous treatment provided mild relief.   Pertinent negatives include no chronic fatigue syndrome, no chronic pain, no hypothyroidism, no anxiety, no eating disorder and no depression. Cerebrovascular Accident  This is a chronic problem. The problem occurs intermittently. The problem has been waxing and waning. Pertinent negatives include no abdominal pain, arthralgias, chest pain, chills, congestion, coughing, fatigue, fever, headaches, joint swelling, myalgias, nausea, neck pain, rash, sore throat or vertigo. Nothing aggravates the symptoms. The treatment provided mild relief.    No problem-specific Assessment & Plan notes found for this encounter.   Past Medical History:  Diagnosis Date  . Allergy   . Depression   . Hyperlipidemia   . Hypertension   . Sciatica   . Stroke [facility])     Past Surgical History:  Procedure Laterality Date  . GALLBLADDER SURGERY  10/24/2007  . polyp removed     rectum- benign    Family History  Problem Relation Age of Onset  . Cancer Mother   . Cancer Father     Social History   Social History  . Marital status: Married    Spouse name: N/A  . Number of children: N/A  . Years of education: N/A   Occupational History  . Not on file.   Social History Main Topics  . Smoking status:  Former Smoker  . Smokeless tobacco: Not on file  . Alcohol use 0.0 oz/week  . Drug use: No  . Sexual activity: No   Other Topics Concern  . Not on file   Social History Narrative  . No narrative on file    Allergies  Allergen Reactions  .  Amoxicillin Hives  . Penicillins     Outpatient Medications Prior to Visit  Medication Sig Dispense Refill  . fexofenadine-pseudoephedrine (ALLEGRA-D 24) 180-240 MG 24 hr tablet Take 1 tablet by mouth daily. otc 30 tablet 11  . clopidogrel (PLAVIX) 75 MG tablet TAKE 1 TABLET EVERY DAY 90 tablet 1  . losartan-hydrochlorothiazide (HYZAAR) 100-25 MG tablet Take 1 tablet by mouth daily. 90 tablet 1  . lovastatin (MEVACOR) 20 MG tablet Take 2 tablets (40 mg total) by mouth daily. 180 tablet 1  . metoprolol succinate (TOPROL-XL) 100 MG 24 hr tablet Take 1 tablet (100 mg total) by mouth daily. 90 tablet 1  . venlafaxine XR (EFFEXOR-XR) 75 MG 24 hr capsule Take 1 capsule (75 mg total) by mouth daily. 90 capsule 1  . azithromycin (ZITHROMAX) 250 MG tablet 2 today then 1 a day for 4 days (Patient not taking: Reported on 07/20/2016) 6 tablet 0  . guaiFENesin-codeine (ROBITUSSIN AC) 100-10 MG/5ML syrup Take 5 mLs by mouth 3 (three) times daily as needed for cough. (Patient not taking: Reported on 07/20/2016) 150 mL 0   No facility-administered medications prior to visit.     Review of Systems  Constitutional: Negative for chills, fatigue, fever, malaise/fatigue and weight loss.  HENT: Negative for congestion, ear discharge, ear pain and sore throat.   Eyes: Negative for blurred vision.  Respiratory: Negative for cough, sputum production, shortness of breath and wheezing.   Cardiovascular: Negative for chest pain, palpitations, orthopnea, leg swelling and PND.  Gastrointestinal: Negative for abdominal pain, blood in stool, constipation, diarrhea, heartburn, melena and nausea.  Genitourinary: Negative for dysuria, frequency, hematuria and urgency.  Musculoskeletal: Negative for arthralgias, back pain, joint pain, joint swelling, myalgias and neck pain.  Skin: Negative for rash.  Neurological: Negative for dizziness, vertigo, tingling, sensory change, focal weakness and headaches.  Endo/Heme/Allergies:  Negative for environmental allergies and polydipsia. Does not bruise/bleed easily.  Psychiatric/Behavioral: Positive for depression. Negative for decreased concentration and suicidal ideas. The patient is not nervous/anxious and does not have insomnia.      Objective  Vitals:   01/18/17 0910  BP: 120/70  Pulse: 76  Weight: 218 lb (98.9 kg)  Height: 5\' 7"  (1.702 m)    Physical Exam  Constitutional: She is well-developed, well-nourished, and in no distress. She is not irritable. No distress.  HENT:  Head: Normocephalic and atraumatic.  Right Ear: External ear normal.  Left Ear: External ear normal.  Nose: Nose normal.  Mouth/Throat: Oropharynx is clear and moist.  Eyes: Conjunctivae and EOM are normal. Pupils are equal, round, and reactive to light. Right eye exhibits no discharge. Left eye exhibits no discharge.  Neck: Normal range of motion. Neck supple. No JVD present. No thyromegaly present.  Cardiovascular: Normal rate, regular rhythm, normal heart sounds and intact distal pulses.  Exam reveals no gallop and no friction rub.   No murmur heard. Pulmonary/Chest: Effort normal and breath sounds normal. She has no wheezes. She has no rales.  Abdominal: Soft. Bowel sounds are normal. She exhibits no mass. There is no tenderness. There is no guarding.  Musculoskeletal: Normal range of motion. She exhibits no edema.  Lymphadenopathy:  She has no cervical adenopathy.  Neurological: She is alert. She has normal reflexes.  Skin: Skin is warm and dry. She is not diaphoretic.  Psychiatric: Mood and affect normal.  Nursing note and vitals reviewed.     Assessment & Plan  Problem List Items Addressed This Visit      Cardiovascular and Mediastinum   Essential (primary) hypertension   Relevant Medications   lovastatin (MEVACOR) 20 MG tablet   losartan-hydrochlorothiazide (HYZAAR) 100-25 MG tablet   metoprolol succinate (TOPROL-XL) 100 MG 24 hr tablet   Other Relevant Orders    Renal Function Panel   Cerebral microvascular disease   Relevant Medications   lovastatin (MEVACOR) 20 MG tablet   losartan-hydrochlorothiazide (HYZAAR) 100-25 MG tablet   clopidogrel (PLAVIX) 75 MG tablet   metoprolol succinate (TOPROL-XL) 100 MG 24 hr tablet     Other   Familial multiple lipoprotein-type hyperlipidemia   Relevant Medications   lovastatin (MEVACOR) 20 MG tablet   losartan-hydrochlorothiazide (HYZAAR) 100-25 MG tablet   metoprolol succinate (TOPROL-XL) 100 MG 24 hr tablet   Other Relevant Orders   Lipid Profile   Recurrent major depressive episodes (HCC)   Relevant Medications   venlafaxine XR (EFFEXOR-XR) 75 MG 24 hr capsule   H/O: HTN (hypertension)   Relevant Medications   losartan-hydrochlorothiazide (HYZAAR) 100-25 MG tablet   metoprolol succinate (TOPROL-XL) 100 MG 24 hr tablet   Other Relevant Orders   Renal Function Panel      Meds ordered this encounter  Medications  . venlafaxine XR (EFFEXOR-XR) 75 MG 24 hr capsule    Sig: Take 1 capsule (75 mg total) by mouth daily.    Dispense:  90 capsule    Refill:  1  . lovastatin (MEVACOR) 20 MG tablet    Sig: Take 2 tablets (40 mg total) by mouth daily.    Dispense:  180 tablet    Refill:  1    Please put note on bottle- needs to be seen in March  . losartan-hydrochlorothiazide (HYZAAR) 100-25 MG tablet    Sig: Take 1 tablet by mouth daily.    Dispense:  90 tablet    Refill:  1  . clopidogrel (PLAVIX) 75 MG tablet    Sig: Take 1 tablet (75 mg total) by mouth daily.    Dispense:  90 tablet    Refill:  1  . metoprolol succinate (TOPROL-XL) 100 MG 24 hr tablet    Sig: Take 1 tablet (100 mg total) by mouth daily.    Dispense:  90 tablet    Refill:  1      Dr. Otilio Miu [facility]  01/18/17

## 2017-01-19 LAB — RENAL FUNCTION PANEL
ALBUMIN: 4 g/dL (ref 3.5–4.8)
BUN/Creatinine Ratio: 12 (ref 12–28)
BUN: 12 mg/dL (ref 8–27)
CHLORIDE: 103 mmol/L (ref 96–106)
CO2: 27 mmol/L (ref 18–29)
CREATININE: 1 mg/dL (ref 0.57–1.00)
Calcium: 8.8 mg/dL (ref 8.7–10.3)
GFR calc Af Amer: 65 mL/min/{1.73_m2} (ref >59)
GFR calc non Af Amer: 56 mL/min/{1.73_m2} — ABNORMAL LOW (ref >59)
Glucose: 126 mg/dL — ABNORMAL HIGH (ref 65–99)
Phosphorus: 2.8 mg/dL (ref 2.5–4.5)
Potassium: 4.4 mmol/L (ref 3.5–5.2)
Sodium: 144 mmol/L (ref 134–144)

## 2017-01-19 LAB — LIPID PANEL
Chol/HDL Ratio: 3 ratio units (ref 0.0–4.4)
Cholesterol, Total: 166 mg/dL (ref 100–199)
HDL: 55 mg/dL (ref >39)
LDL CALC: 97 mg/dL (ref 0–99)
Triglycerides: 72 mg/dL (ref 0–149)
VLDL CHOLESTEROL CAL: 14 mg/dL (ref 5–40)

## 2017-07-18 ENCOUNTER — Encounter: Payer: Self-pay | Admitting: Family Medicine

## 2017-07-18 ENCOUNTER — Ambulatory Visit (INDEPENDENT_AMBULATORY_CARE_PROVIDER_SITE_OTHER): Payer: Medicare HMO | Admitting: Family Medicine

## 2017-07-18 VITALS — BP 130/80 | HR 64 | Ht 67.0 in | Wt 218.0 lb

## 2017-07-18 DIAGNOSIS — R69 Illness, unspecified: Secondary | ICD-10-CM

## 2017-07-18 DIAGNOSIS — E7849 Other hyperlipidemia: Secondary | ICD-10-CM

## 2017-07-18 DIAGNOSIS — E784 Other hyperlipidemia: Secondary | ICD-10-CM

## 2017-07-18 DIAGNOSIS — Z8679 Personal history of other diseases of the circulatory system: Secondary | ICD-10-CM

## 2017-07-18 DIAGNOSIS — F339 Major depressive disorder, recurrent, unspecified: Secondary | ICD-10-CM | POA: Diagnosis not present

## 2017-07-18 DIAGNOSIS — I6789 Other cerebrovascular disease: Secondary | ICD-10-CM

## 2017-07-18 DIAGNOSIS — I679 Cerebrovascular disease, unspecified: Secondary | ICD-10-CM

## 2017-07-18 DIAGNOSIS — Z23 Encounter for immunization: Secondary | ICD-10-CM | POA: Diagnosis not present

## 2017-07-18 DIAGNOSIS — I10 Essential (primary) hypertension: Secondary | ICD-10-CM | POA: Diagnosis not present

## 2017-07-18 MED ORDER — CLOPIDOGREL BISULFATE 75 MG PO TABS: 75.0000 mg | ORAL_TABLET | Freq: Every day | ORAL | 1 refills | Status: DC

## 2017-07-18 MED ORDER — METOPROLOL SUCCINATE ER 100 MG PO TB24: 100.0000 mg | ORAL_TABLET | Freq: Every day | ORAL | 1 refills | Status: DC

## 2017-07-18 MED ORDER — VENLAFAXINE HCL ER 75 MG PO CP24: 75.0000 mg | ORAL_CAPSULE | Freq: Every day | ORAL | 1 refills | Status: DC

## 2017-07-18 MED ORDER — LOSARTAN POTASSIUM-HCTZ 100-25 MG PO TABS: 1.0000 | ORAL_TABLET | Freq: Every day | ORAL | 1 refills | Status: DC

## 2017-07-18 MED ORDER — LOVASTATIN 20 MG PO TABS: 40.0000 mg | ORAL_TABLET | Freq: Every day | ORAL | 1 refills | Status: DC

## 2017-07-18 NOTE — Progress Notes (Signed)
Name: Karina Leonard   MRN: [id number]    DOB: [birth year]   Date:07/18/2017       Progress Note  Subjective  Chief Complaint  Chief Complaint  Patient presents with  . Hypertension  . Depression  . Hyperlipidemia  . Cerebrovascular Accident    takes plavix    Hypertension  This is a chronic problem. The current episode started more than 1 year ago. The problem has been gradually improving since onset. The problem is controlled. Pertinent negatives include no anxiety, blurred vision, chest pain, headaches, malaise/fatigue, neck pain, orthopnea, palpitations, peripheral edema, PND, shortness of breath or sweats. There are no associated agents to hypertension. Risk factors for coronary artery disease include diabetes mellitus. Past treatments include ACE inhibitors, beta blockers and diuretics. The current treatment provides moderate improvement. There are no compliance problems.  There is no history of angina, kidney disease, CAD/MI, CVA, heart failure, left ventricular hypertrophy, PVD or retinopathy. There is no history of chronic renal disease, a hypertension causing med or renovascular disease.  Depression         The patient presents with no depression.  This is a chronic problem.  The current episode started more than 1 year ago.   The onset quality is gradual.   The problem occurs intermittently.The problem is unchanged.  Associated symptoms include no decreased concentration, no fatigue, no helplessness, no hopelessness, does not have insomnia, not irritable, no restlessness, no decreased interest, no appetite change, no body aches, no myalgias, no headaches, no indigestion, not sad and no suicidal ideas.     The symptoms are aggravated by nothing.  Past treatments include SSRIs - Selective serotonin reuptake inhibitors.  Compliance with treatment is good.  Previous treatment provided moderate relief.   Pertinent negatives include no anxiety and no depression. Hyperlipidemia  This is a  chronic problem. The current episode started more than 1 year ago. The problem is controlled. Recent lipid tests were reviewed and are normal. She has no history of chronic renal disease. There are no known factors aggravating her hyperlipidemia. Pertinent negatives include no chest pain, focal sensory loss, focal weakness, leg pain, myalgias or shortness of breath. Current antihyperlipidemic treatment includes statins. The current treatment provides moderate improvement of lipids. There are no compliance problems.  Risk factors for coronary artery disease include hypertension.  Cerebrovascular Accident  This is a chronic problem. The current episode started more than 1 year ago. The problem occurs rarely. The problem has been waxing and waning. Pertinent negatives include no abdominal pain, anorexia, arthralgias, change in bowel habit, chest pain, chills, congestion, coughing, diaphoresis, fatigue, fever, headaches, joint swelling, myalgias, nausea, neck pain, numbness, rash, sore throat, swollen glands, urinary symptoms, vertigo, visual change, vomiting or weakness. Nothing aggravates the symptoms. The treatment provided mild relief.    No problem-specific Assessment & Plan notes found for this encounter.   Past Medical History:  Diagnosis Date  . Allergy   . Depression   . Hyperlipidemia   . Hypertension   . Sciatica   . Stroke [facility])     Past Surgical History:  Procedure Laterality Date  . GALLBLADDER SURGERY  10/24/2007  . polyp removed     rectum- benign    Family History  Problem Relation Age of Onset  . Cancer Mother   . Cancer Father     Social History   Social History  . Marital status: Married    Spouse name: N/A  . Number of children: N/A  .  Years of education: N/A   Occupational History  . Not on file.   Social History Main Topics  . Smoking status: Former Research scientist (life sciences)  . Smokeless tobacco: Never Used  . Alcohol use 0.0 oz/week  . Drug use: No  . Sexual activity: No    Other Topics Concern  . Not on file   Social History Narrative  . No narrative on file    Allergies  Allergen Reactions  . Amoxicillin Hives  . Penicillins     Outpatient Medications Prior to Visit  Medication Sig Dispense Refill  . fexofenadine-pseudoephedrine (ALLEGRA-D 24) 180-240 MG 24 hr tablet Take 1 tablet by mouth daily. otc 30 tablet 11  . clopidogrel (PLAVIX) 75 MG tablet Take 1 tablet (75 mg total) by mouth daily. 90 tablet 1  . losartan-hydrochlorothiazide (HYZAAR) 100-25 MG tablet Take 1 tablet by mouth daily. 90 tablet 1  . lovastatin (MEVACOR) 20 MG tablet Take 2 tablets (40 mg total) by mouth daily. 180 tablet 1  . metoprolol succinate (TOPROL-XL) 100 MG 24 hr tablet Take 1 tablet (100 mg total) by mouth daily. 90 tablet 1  . venlafaxine XR (EFFEXOR-XR) 75 MG 24 hr capsule Take 1 capsule (75 mg total) by mouth daily. 90 capsule 1   No facility-administered medications prior to visit.     Review of Systems  Constitutional: Negative for appetite change, chills, diaphoresis, fatigue, fever, malaise/fatigue and weight loss.  HENT: Negative for congestion, ear discharge, ear pain and sore throat.   Eyes: Negative for blurred vision.  Respiratory: Negative for cough, sputum production, shortness of breath and wheezing.   Cardiovascular: Negative for chest pain, palpitations, orthopnea, leg swelling and PND.  Gastrointestinal: Negative for abdominal pain, anorexia, blood in stool, change in bowel habit, constipation, diarrhea, heartburn, melena, nausea and vomiting.  Genitourinary: Negative for dysuria, frequency, hematuria and urgency.  Musculoskeletal: Negative for arthralgias, back pain, joint pain, joint swelling, myalgias and neck pain.  Skin: Negative for rash.  Neurological: Negative for dizziness, vertigo, tingling, sensory change, focal weakness, weakness, numbness and headaches.  Endo/Heme/Allergies: Negative for environmental allergies and polydipsia. Does  not bruise/bleed easily.  Psychiatric/Behavioral: Positive for depression. Negative for decreased concentration and suicidal ideas. The patient is not nervous/anxious and does not have insomnia.      Objective  Vitals:   07/18/17 1050  BP: 130/80  Pulse: 64  Weight: 218 lb (98.9 kg)  Height: 5\' 7"  (1.702 m)    Physical Exam  Constitutional: She is well-developed, well-nourished, and in no distress. She is not irritable. No distress.  HENT:  Head: Normocephalic and atraumatic.  Right Ear: External ear normal.  Left Ear: External ear normal.  Nose: Nose normal.  Mouth/Throat: Oropharynx is clear and moist.  Eyes: Pupils are equal, round, and reactive to light. Conjunctivae and EOM are normal. Right eye exhibits no discharge. Left eye exhibits no discharge.  Neck: Normal range of motion. Neck supple. No JVD present. No thyromegaly present.  Cardiovascular: Normal rate, regular rhythm, normal heart sounds and intact distal pulses.  Exam reveals no gallop and no friction rub.   No murmur heard. Pulmonary/Chest: Effort normal and breath sounds normal. She has no wheezes. She has no rales.  Abdominal: Soft. Bowel sounds are normal. She exhibits no distension and no mass. There is no tenderness. There is no rebound and no guarding.  Musculoskeletal: Normal range of motion. She exhibits no edema.  Lymphadenopathy:    She has no cervical adenopathy.  Neurological: She is alert.  Skin: Skin is warm and dry. No rash noted. She is not diaphoretic.  Psychiatric: Mood and affect normal.  Nursing note and vitals reviewed.     Assessment & Plan  Problem List Items Addressed This Visit      Cardiovascular and Mediastinum   Essential (primary) hypertension - Primary   Relevant Medications   losartan-hydrochlorothiazide (HYZAAR) 100-25 MG tablet   lovastatin (MEVACOR) 20 MG tablet   metoprolol succinate (TOPROL-XL) 100 MG 24 hr tablet   Other Relevant Orders   Renal Function Panel    Cerebral microvascular disease   Relevant Medications   clopidogrel (PLAVIX) 75 MG tablet   losartan-hydrochlorothiazide (HYZAAR) 100-25 MG tablet   lovastatin (MEVACOR) 20 MG tablet   metoprolol succinate (TOPROL-XL) 100 MG 24 hr tablet     Other   Familial multiple lipoprotein-type hyperlipidemia   Relevant Medications   losartan-hydrochlorothiazide (HYZAAR) 100-25 MG tablet   lovastatin (MEVACOR) 20 MG tablet   metoprolol succinate (TOPROL-XL) 100 MG 24 hr tablet   Other Relevant Orders   Lipid Profile   Recurrent major depressive episodes (HCC)   Relevant Medications   venlafaxine XR (EFFEXOR-XR) 75 MG 24 hr capsule   H/O: HTN (hypertension)   Relevant Medications   losartan-hydrochlorothiazide (HYZAAR) 100-25 MG tablet   metoprolol succinate (TOPROL-XL) 100 MG 24 hr tablet    Other Visit Diagnoses    Taking medication for chronic disease       Relevant Orders   CBC w/Diff/Platelet   Hepatic function panel   Influenza vaccine needed       Relevant Orders   Flu vaccine HIGH DOSE PF (Fluzone High dose) (Completed)      Meds ordered this encounter  Medications  . clopidogrel (PLAVIX) 75 MG tablet    Sig: Take 1 tablet (75 mg total) by mouth daily.    Dispense:  90 tablet    Refill:  1  . losartan-hydrochlorothiazide (HYZAAR) 100-25 MG tablet    Sig: Take 1 tablet by mouth daily.    Dispense:  90 tablet    Refill:  1  . lovastatin (MEVACOR) 20 MG tablet    Sig: Take 2 tablets (40 mg total) by mouth daily.    Dispense:  180 tablet    Refill:  1    Please put note on bottle- needs to be seen in March  . metoprolol succinate (TOPROL-XL) 100 MG 24 hr tablet    Sig: Take 1 tablet (100 mg total) by mouth daily.    Dispense:  90 tablet    Refill:  1  . venlafaxine XR (EFFEXOR-XR) 75 MG 24 hr capsule    Sig: Take 1 capsule (75 mg total) by mouth daily.    Dispense:  90 capsule    Refill:  1      Dr. Otilio Miu [facility] [place]  07/18/17

## 2017-07-19 ENCOUNTER — Ambulatory Visit: Payer: Medicare HMO | Admitting: Family Medicine

## 2017-07-19 LAB — LIPID PANEL
CHOL/HDL RATIO: 3.4 ratio (ref 0.0–4.4)
Cholesterol, Total: 171 mg/dL (ref 100–199)
HDL: 50 mg/dL (ref >39)
LDL Calculated: 104 mg/dL — ABNORMAL HIGH (ref 0–99)
Triglycerides: 84 mg/dL (ref 0–149)
VLDL Cholesterol Cal: 17 mg/dL (ref 5–40)

## 2017-07-19 LAB — RENAL FUNCTION PANEL
Albumin: 4 g/dL (ref 3.5–4.8)
BUN / CREAT RATIO: 17 (ref 12–28)
BUN: 18 mg/dL (ref 8–27)
CO2: 26 mmol/L (ref 20–29)
Calcium: 9 mg/dL (ref 8.7–10.3)
Chloride: 102 mmol/L (ref 96–106)
Creatinine, Ser: 1.08 mg/dL — ABNORMAL HIGH (ref 0.57–1.00)
GFR calc Af Amer: 58 mL/min/{1.73_m2} — ABNORMAL LOW (ref >59)
GFR, EST NON AFRICAN AMERICAN: 51 mL/min/{1.73_m2} — AB (ref >59)
GLUCOSE: 110 mg/dL — AB (ref 65–99)
PHOSPHORUS: 3.5 mg/dL (ref 2.5–4.5)
POTASSIUM: 4.6 mmol/L (ref 3.5–5.2)
SODIUM: 142 mmol/L (ref 134–144)

## 2017-07-19 LAB — HEPATIC FUNCTION PANEL
ALT: 8 IU/L (ref 0–32)
AST: 21 IU/L (ref 0–40)
Alkaline Phosphatase: 108 IU/L (ref 39–117)
Bilirubin Total: 0.5 mg/dL (ref 0.0–1.2)
Bilirubin, Direct: 0.15 mg/dL (ref 0.00–0.40)
TOTAL PROTEIN: 6.7 g/dL (ref 6.0–8.5)

## 2017-07-19 LAB — CBC WITH DIFFERENTIAL/PLATELET
BASOS: 0 %
Basophils Absolute: 0 10*3/uL (ref 0.0–0.2)
EOS (ABSOLUTE): 0.2 10*3/uL (ref 0.0–0.4)
EOS: 2 %
HEMATOCRIT: 42.5 % (ref 34.0–46.6)
Hemoglobin: 13.7 g/dL (ref 11.1–15.9)
IMMATURE GRANULOCYTES: 0 %
Immature Grans (Abs): 0 10*3/uL (ref 0.0–0.1)
LYMPHS ABS: 2.3 10*3/uL (ref 0.7–3.1)
Lymphs: 21 %
MCH: 31.4 pg (ref 26.6–33.0)
MCHC: 32.2 g/dL (ref 31.5–35.7)
MCV: 97 fL (ref 79–97)
MONOS ABS: 0.5 10*3/uL (ref 0.1–0.9)
Monocytes: 5 %
NEUTROS ABS: 8.2 10*3/uL — AB (ref 1.4–7.0)
NEUTROS PCT: 72 %
PLATELETS: 288 10*3/uL (ref 150–379)
RBC: 4.37 x10E6/uL (ref 3.77–5.28)
RDW: 13.6 % (ref 12.3–15.4)
WBC: 11.3 10*3/uL — ABNORMAL HIGH (ref 3.4–10.8)

## 2017-08-06 DIAGNOSIS — M10071 Idiopathic gout, right ankle and foot: Secondary | ICD-10-CM | POA: Diagnosis not present

## 2017-10-25 ENCOUNTER — Other Ambulatory Visit: Payer: Self-pay | Admitting: Family Medicine

## 2017-10-25 DIAGNOSIS — Z8679 Personal history of other diseases of the circulatory system: Secondary | ICD-10-CM

## 2017-10-25 DIAGNOSIS — F339 Major depressive disorder, recurrent, unspecified: Secondary | ICD-10-CM

## 2017-10-25 DIAGNOSIS — I6789 Other cerebrovascular disease: Secondary | ICD-10-CM

## 2017-10-25 DIAGNOSIS — E7849 Other hyperlipidemia: Secondary | ICD-10-CM

## 2017-10-25 DIAGNOSIS — I10 Essential (primary) hypertension: Secondary | ICD-10-CM

## 2017-10-25 DIAGNOSIS — I679 Cerebrovascular disease, unspecified: Secondary | ICD-10-CM

## 2017-12-12 ENCOUNTER — Telehealth: Payer: Self-pay

## 2017-12-12 NOTE — Telephone Encounter (Signed)
Called pt to sched AWV w/ [facility]. LVM requesting returned call.  

## 2017-12-13 ENCOUNTER — Telehealth: Payer: Self-pay

## 2017-12-13 NOTE — Telephone Encounter (Signed)
Called pt to sched AWV w/ [facility]. LVM requesting returned call.  

## 2017-12-26 ENCOUNTER — Telehealth: Payer: Self-pay | Admitting: Family Medicine

## 2017-12-26 NOTE — Telephone Encounter (Signed)
Called to schedule AWV with Nurse Health Advisor. °Kathryn Brown °[phone]  °Skype [email or web address]  ° °

## 2018-01-24 ENCOUNTER — Ambulatory Visit (INDEPENDENT_AMBULATORY_CARE_PROVIDER_SITE_OTHER): Payer: Medicare HMO

## 2018-01-24 VITALS — BP 132/72 | HR 78 | Temp 97.7°F | Ht 67.0 in | Wt 219.2 lb

## 2018-01-24 DIAGNOSIS — Z0000 Encounter for general adult medical examination without abnormal findings: Secondary | ICD-10-CM

## 2018-01-24 NOTE — Progress Notes (Signed)
Subjective:   Karina Leonard is a 77 y.o. female who presents for Medicare Annual (Subsequent) preventive examination.  Review of Systems:  N/A Cardiac Risk Factors include: advanced age (>68men, >1 women);diabetes mellitus;dyslipidemia;hypertension;obesity (BMI >30kg/m2)     Objective:     Vitals: BP 132/72 (BP Location: Left Arm, Patient Position: Sitting, Cuff Size: Large)   Pulse 78   Temp 97.7 F (36.5 C) (Oral)   Ht 5\' 7"  (1.702 m)   Wt 219 lb 3.2 oz (99.4 kg)   SpO2 97%   BMI 34.33 kg/m   Body mass index is 34.33 kg/m.  Advanced Directives 01/24/2018 07/23/2015  Does Patient Have a Medical Advance Directive? No No  Would patient like information on creating a medical advance directive? Yes (MAU/Ambulatory/Procedural Areas - Information given) No - patient declined information    Tobacco Social History   Tobacco Use  Smoking Status Former Smoker  . Packs/day: 1.00  . Years: 20.00  . Pack years: 20.00  . Types: Cigarettes  . Last attempt to quit: 1967  . Years since quitting: 52.2  Smokeless Tobacco Never Used     Counseling given: No   Clinical Intake:  Pre-visit preparation completed: Yes  Pain : No/denies pain   BMI - recorded: 34.33 Nutritional Status: BMI > 30  Obese Nutrition Risk Assessment: Has the patient had any N/V/D within the last 2 months?  No Does the patient have any non-healing wounds?  No Has the patient had any unintentional weight loss or weight gain?  No  Is the patient diabetic?  Yes If diabetic, was a CBG obtained today?  No Did the patient bring in their glucometer from home?  No Comments: Pt does not monitor CBG's at home. Denies it is due to financial strains. Education provided re: the importance of blood glucose monitoring.  Diabetic Exams: Diabetic Eye Exam: Overdue for diabetic eye exam. Pt has been advised about the importance in completing this exam. Advised to schedule an appt with Dr. Rolanda Jay to complete this exam.  Once completed, reminded pt to provide the office with a copy of his/her diabetic eye exam. Verbalized acceptance and understanding.  Diabetic Foot Exam: Overdue for diabetic foot exam. Pt has been advised about the importance in completing this exam. Advised to schedule an appt with Dr. Ronnald Ramp to complete this exam. Once completed, reminded pt to provide the office with a copy of his/her diabetic foot exam. Verbalized acceptance and understanding.  How often do you need to have someone help you when you read instructions, pamphlets, or other written materials from your doctor or pharmacy?: 1 - Never  Interpreter Needed?: No  Information entered by :: Idell Pickles, LPN  Past Medical History:  Diagnosis Date  . Allergy   . Cerebrovascular disease   . Depression   . Diabetes mellitus without complication ([place])   . Hyperlipidemia   . Hypertension   . Sciatica   . Stroke [facility])    Past Surgical History:  Procedure Laterality Date  . GALLBLADDER SURGERY  10/24/2007  . polyp removed     rectum- benign   Family History  Problem Relation Age of Onset  . Cancer Mother        uterine or ovarian. Unable to remember  . Cancer Father        lung  . Heart attack Sister    Social History   Socioeconomic History  . Marital status: Married    Spouse name: Not on file  . Number  of children: 3  . Years of education: some college  . Highest education level: 12th grade  Occupational History  . Occupation: Retired  Scientific laboratory technician  . Financial resource strain: Not hard at all  . Food insecurity:    Worry: Never true    Inability: Never true  . Transportation needs:    Medical: No    Non-medical: No  Tobacco Use  . Smoking status: Former Smoker    Packs/day: 1.00    Years: 20.00    Pack years: 20.00    Types: Cigarettes    Last attempt to quit: 1967    Years since quitting: 52.2  . Smokeless tobacco: Never Used  Substance and Sexual Activity  . Alcohol use: Yes    Alcohol/week: 0.0 oz      Comment: special occasions  . Drug use: No  . Sexual activity: Never  Lifestyle  . Physical activity:    Days per week: 3 days    Minutes per session: 60 min  . Stress: Not at all  Relationships  . Social connections:    Talks on phone: Patient refused    Gets together: Patient refused    Attends religious service: Patient refused    Active member of club or organization: Patient refused    Attends meetings of clubs or organizations: Patient refused    Relationship status: Married  Other Topics Concern  . Not on file  Social History Narrative  . Not on file    Outpatient Encounter Medications as of 01/24/2018  Medication Sig  . clopidogrel (PLAVIX) 75 MG tablet TAKE 1 TABLET (75 MG TOTAL) BY MOUTH DAILY.  . fexofenadine-pseudoephedrine (ALLEGRA-D 24) 180-240 MG 24 hr tablet Take 1 tablet by mouth daily. otc  . losartan-hydrochlorothiazide (HYZAAR) 100-25 MG tablet Take 1 tablet by mouth daily.  Marland Kitchen lovastatin (MEVACOR) 20 MG tablet TAKE 2 TABLETS (40 MG TOTAL) BY MOUTH DAILY. NEED TO BE SEEN IN MARCH  . metoprolol succinate (TOPROL-XL) 100 MG 24 hr tablet TAKE 1 TABLET (100 MG TOTAL) BY MOUTH DAILY.  Marland Kitchen venlafaxine XR (EFFEXOR-XR) 75 MG 24 hr capsule TAKE 1 CAPSULE (75 MG TOTAL) BY MOUTH DAILY.   No facility-administered encounter medications on file as of 01/24/2018.     Activities of Daily Living In your present state of health, do you have any difficulty performing the following activities: 01/24/2018  Hearing? N  Comment denies hearing aids  Vision? Y  Comment cataracts; wears eyeglasses  Difficulty concentrating or making decisions? N  Walking or climbing stairs? Y  Comment back pain  Dressing or bathing? N  Doing errands, shopping? N  Preparing Food and eating ? N  Comment full set upper and lower dentures  Using the Toilet? N  In the past six months, have you accidently leaked urine? Y  Comment stress incontinence  Do you have problems with loss of bowel control? N   Managing your Medications? N  Managing your Finances? N  Housekeeping or managing your Housekeeping? N  Some recent data might be hidden    Patient Care Team: Juline Patch, MD as PCP - General (Family Medicine)    Assessment:   This is a routine wellness examination for Eylin.  Exercise Activities and Dietary recommendations Current Exercise Habits: Home exercise routine, Type of exercise: treadmill, Time (Minutes): 60, Frequency (Times/Week): 3, Weekly Exercise (Minutes/Week): 180, Intensity: Mild, Exercise limited by: None identified  Goals    . DIET - INCREASE WATER INTAKE  Recommend to drink at least 6-8 8oz glasses of water per day.       Fall Risk Fall Risk  01/24/2018 07/18/2017 01/19/2016 07/23/2015  Falls in the past year? Yes Yes No No  Comment states she slipped going down dtrs stairs - - -  Number falls in past yr: 1 1 - -  Injury with Fall? No No - -  Risk for fall due to : Impaired vision - - -  Follow up Falls evaluation completed;Education provided;Falls prevention discussed Falls evaluation completed - -   Is the home free of loose throw rugs in walkways, pet beds, electrical cords, etc? Yes Adequate lighting to reduce risk of falls?  Yes In addition, does the patient have any of the following: Stairs in or around the home WITH handrails? No Grab bars in the bathroom? Yes  Shower chair or a place to sit while bathing? Yes Use of a cane, walker or w/c? No Use of an elevated toilet seat or a handicapped toilet? Yes  Timed Get Up and Go Performed: Yes. Pt ambulated 10 feet within 15 sec. Gait slow, steady and without the use of an assistive device. No intervention required at this time. Fall risk prevention has been discussed.  Pt declined my offer to send Community Resource Referral to Care Guide for a shower chair or an elevated toilet seat.  Depression Screen PHQ 2/9 Scores 01/24/2018 01/24/2018 07/18/2017 07/18/2017  PHQ - 2 Score 0 0 0 0  PHQ- 9 Score  0 - 0 -     Cognitive Function     6CIT Screen 01/24/2018  What Year? 0 points  What month? 0 points  What time? 0 points  Count back from 20 0 points  Months in reverse 2 points  Repeat phrase 2 points  Total Score 4    Immunization History  Administered Date(s) Administered  . Influenza Split 08/25/2010  . Influenza, High Dose Seasonal PF 07/18/2017  . Influenza,inj,Quad PF,6+ Mos 07/04/2013, 07/21/2014, 07/23/2015, 07/20/2016  . Influenza-Unspecified 07/21/2014  . Pneumococcal Conjugate-13 12/18/2014  . Pneumococcal Polysaccharide-23 01/20/2013  . Tdap 07/23/2015  . Zoster 10/23/2013    Qualifies for Shingles Vaccine? Yes. Zostavax completed 10/23/13. Due for Shingrix vaccine. Education has been provided regarding the importance of this vaccine. Pt has been advised to call her insurance company to determine her out of pocket expense. Advised she may also receive this vaccine at her local pharmacy or Health Dept. Verbalized acceptance and understanding.  Screening Tests Health Maintenance  Topic Date Due  . HEMOGLOBIN A1C  August 05, 1941  . FOOT EXAM  02/03/1953  . OPHTHALMOLOGY EXAM  02/03/1953  . INFLUENZA VACCINE  05/23/2018  . COLONOSCOPY  09/04/2024  . TETANUS/TDAP  07/22/2025  . DEXA SCAN  Completed  . PNA vac Low Risk Adult  Completed  . MAMMOGRAM  Discontinued    Cancer Screenings: Lung: Low Dose CT Chest recommended if Age 65-80 years, 30 pack-year currently smoking OR have quit w/in 15years. Patient does not qualify. Breast:  Up to date on Mammogram? No. Completed 07/29/14. Repeat every year.Pt states she no loner wishes to undergo breast screenings. Education has been provided regarding the importance of this screening but still declined.  Up to date of Bone Density/Dexa? Yes. Completed 06/24/11. Osteoporotic screening no longer required. Colorectal: Completed colonoscopy 09/04/14. Repeat every 10 years.  Additional Screenings: Hepatitis C Screening: Does not  qualify    Plan:  I have personally reviewed and addressed the Medicare Annual Wellness questionnaire  and have noted the following in the patient's chart:  A. Medical and social history B. Use of alcohol, tobacco or illicit drugs  C. Current medications and supplements D. Functional ability and status E.  Nutritional status F.  Physical activity G. Advance directives H. List of other physicians I.  Hospitalizations, surgeries, and ER visits in previous 12 months J.  [place] such as hearing and vision if needed, cognitive and depression L. Referrals and appointments  In addition, I have reviewed and discussed with patient certain preventive protocols, quality metrics, and best practice recommendations. A written personalized care plan for preventive services as well as general preventive health recommendations were provided to patient.  Signed,  Aleatha Borer, LPN Nurse Health Advisor  MD Recommendations: Zostavax completed 10/23/13. Due for Shingrix vaccine. Education has been provided regarding the importance of this vaccine. Pt has been advised to call her insurance company to determine her out of pocket expense. Advised she may also receive this vaccine at her local pharmacy or Health Dept. Verbalized acceptance and understanding.  Diabetic Eye Exam: Overdue for diabetic eye exam. Pt has been advised about the importance in completing this exam. Advised to schedule an appt with Dr. Rolanda Jay to complete this exam. Once completed, reminded pt to provide the office with a copy of his/her diabetic eye exam. Verbalized acceptance and understanding.  Diabetic Foot Exam: Overdue for diabetic foot exam. Pt has been advised about the importance in completing this exam. Advised to schedule an appt with Dr. Ronnald Ramp to complete this exam. Once completed, reminded pt to provide the office with a copy of his/her diabetic foot exam. Verbalized acceptance and understanding.  Breast Screening:  Completed 07/29/14. Repeat every year.Pt states she no loner wishes to undergo breast screenings. Education has been provided regarding the importance of this screening but still declined.

## 2018-01-24 NOTE — Patient Instructions (Signed)
[name] , Thank you for taking time to come for your Medicare Wellness Visit. I appreciate your ongoing commitment to your health goals. Please review the following plan we discussed and let me know if I can assist you in the future.   Screening recommendations/referrals: Colorectal Screening: Completed colonoscopy 09/04/14. Repeat every 10 years Mammogram: Completed 07/29/14. Repeat every year.You have requested not to continue breast screenings in the future   Bone Density: Completed 06/24/11. Osteoporotic screening no longer required.  Vision and Dental Exams: Recommended annual ophthalmology exams for early detection of glaucoma and other disorders of the eye Recommended annual dental exams for proper oral hygiene  Diabetic Exams: Recommended annual diabetic eye exams for early detection of retinopathy Recommended annual diabetic foot exams for early detection of peripheral neuropathy.  Diabetic Eye Exam: Please schedule an appointment with your ophthalmologist for completion Diabetic Foot Exam: Please schedule an appointment with Dr. Ronnald Ramp for completion  Vaccinations: Influenza vaccine: Up to date Pneumococcal vaccine: Completed series Tdap vaccine: Up to date Shingles vaccine: Please call your insurance company to determine your out of pocket expense for the Shingrix vaccine. You may also receive this vaccine at your local pharmacy or Health Dept.  Advanced directives: Advance directive discussed with you today. I have provided a copy for you to complete at home and have notarized. Once this is complete please bring a copy in to our office so we can scan it into your chart.  Conditions/risks identified: Recommend to drink at least 6-8 8oz glasses of water per day.  Next appointment: Please schedule your Annual Wellness Visit with your Nurse Health Advisor in one year.  Preventive Care 85 Years and Older, Female Preventive care refers to lifestyle choices and visits with your  health care provider that can promote health and wellness. What does preventive care include?  A yearly physical exam. This is also called an annual well check.  Dental exams once or twice a year.  Routine eye exams. Ask your health care provider how often you should have your eyes checked.  Personal lifestyle choices, including:  Daily care of your teeth and gums.  Regular physical activity.  Eating a healthy diet.  Avoiding tobacco and drug use.  Limiting alcohol use.  Practicing safe sex.  Taking low-dose aspirin every day.  Taking vitamin and mineral supplements as recommended by your health care provider. What happens during an annual well check? The services and screenings done by your health care provider during your annual well check will depend on your age, overall health, lifestyle risk factors, and family history of disease. Counseling  Your health care provider may ask you questions about your:  Alcohol use.  Tobacco use.  Drug use.  Emotional well-being.  Home and relationship well-being.  Sexual activity.  Eating habits.  History of falls.  Memory and ability to understand (cognition).  Work and work Statistician.  Reproductive health. Screening  You may have the following tests or measurements:  Height, weight, and BMI.  Blood pressure.  Lipid and cholesterol levels. These may be checked every 5 years, or more frequently if you are over 70 years old.  Skin check.  Lung cancer screening. You may have this screening every year starting at age 50 if you have a 30-pack-year history of smoking and currently smoke or have quit within the past 15 years.  Fecal occult blood test (FOBT) of the stool. You may have this test every year starting at age 26.  Flexible sigmoidoscopy or  colonoscopy. You may have a sigmoidoscopy every 5 years or a colonoscopy every 10 years starting at age 45.  Hepatitis C blood test.  Hepatitis B blood  test.  Sexually transmitted disease (STD) testing.  Diabetes screening. This is done by checking your blood sugar (glucose) after you have not eaten for a while (fasting). You may have this done every 1-3 years.  Bone density scan. This is done to screen for osteoporosis. You may have this done starting at age 77.  Mammogram. This may be done every 1-2 years. Talk to your health care provider about how often you should have regular mammograms. Talk with your health care provider about your test results, treatment options, and if necessary, the need for more tests. Vaccines  Your health care provider may recommend certain vaccines, such as:  Influenza vaccine. This is recommended every year.  Tetanus, diphtheria, and acellular pertussis (Tdap, Td) vaccine. You may need a Td booster every 10 years.  Zoster vaccine. You may need this after age 74.  Pneumococcal 13-valent conjugate (PCV13) vaccine. One dose is recommended after age 29.  Pneumococcal polysaccharide (PPSV23) vaccine. One dose is recommended after age 43. Talk to your health care provider about which screenings and vaccines you need and how often you need them. This information is not intended to replace advice given to you by your health care provider. Make sure you discuss any questions you have with your health care provider. Document Released: 11/05/2015 Document Revised: 06/28/2016 Document Reviewed: 08/10/2015 Elsevier Interactive Patient Education  2017 [place] Prevention in the Home Falls can cause injuries. They can happen to people of all ages. There are many things you can do to make your home safe and to help prevent falls. What can I do on the outside of my home?  Regularly fix the edges of walkways and driveways and fix any cracks.  Remove anything that might make you trip as you walk through a door, such as a raised step or threshold.  Trim any bushes or trees on the path to your home.  Use  bright outdoor lighting.  Clear any walking paths of anything that might make someone trip, such as rocks or tools.  Regularly check to see if handrails are loose or broken. Make sure that both sides of any steps have handrails.  Any raised decks and porches should have guardrails on the edges.  Have any leaves, snow, or ice cleared regularly.  Use sand or salt on walking paths during winter.  Clean up any spills in your garage right away. This includes oil or grease spills. What can I do in the bathroom?  Use night lights.  Install grab bars by the toilet and in the tub and shower. Do not use towel bars as grab bars.  Use non-skid mats or decals in the tub or shower.  If you need to sit down in the shower, use a plastic, non-slip stool.  Keep the floor dry. Clean up any water that spills on the floor as soon as it happens.  Remove soap buildup in the tub or shower regularly.  Attach bath mats securely with double-sided non-slip rug tape.  Do not have throw rugs and other things on the floor that can make you trip. What can I do in the bedroom?  Use night lights.  Make sure that you have a light by your bed that is easy to reach.  Do not use any sheets or blankets that are too  big for your bed. They should not hang down onto the floor.  Have a firm chair that has side arms. You can use this for support while you get dressed.  Do not have throw rugs and other things on the floor that can make you trip. What can I do in the kitchen?  Clean up any spills right away.  Avoid walking on wet floors.  Keep items that you use a lot in easy-to-reach places.  If you need to reach something above you, use a strong step stool that has a grab bar.  Keep electrical cords out of the way.  Do not use floor polish or wax that makes floors slippery. If you must use wax, use non-skid floor wax.  Do not have throw rugs and other things on the floor that can make you trip. What can  I do with my stairs?  Do not leave any items on the stairs.  Make sure that there are handrails on both sides of the stairs and use them. Fix handrails that are broken or loose. Make sure that handrails are as long as the stairways.  Check any carpeting to make sure that it is firmly attached to the stairs. Fix any carpet that is loose or worn.  Avoid having throw rugs at the top or bottom of the stairs. If you do have throw rugs, attach them to the floor with carpet tape.  Make sure that you have a light switch at the top of the stairs and the bottom of the stairs. If you do not have them, ask someone to add them for you. What else can I do to help prevent falls?  Wear shoes that:  Do not have high heels.  Have rubber bottoms.  Are comfortable and fit you well.  Are closed at the toe. Do not wear sandals.  If you use a stepladder:  Make sure that it is fully opened. Do not climb a closed stepladder.  Make sure that both sides of the stepladder are locked into place.  Ask someone to hold it for you, if possible.  Clearly mark and make sure that you can see:  Any grab bars or handrails.  First and last steps.  Where the edge of each step is.  Use tools that help you move around (mobility aids) if they are needed. These include:  Canes.  Walkers.  Scooters.  Crutches.  Turn on the lights when you go into a dark area. Replace any light bulbs as soon as they burn out.  Set up your furniture so you have a clear path. Avoid moving your furniture around.  If any of your floors are uneven, fix them.  If there are any pets around you, be aware of where they are.  Review your medicines with your doctor. Some medicines can make you feel dizzy. This can increase your chance of falling. Ask your doctor what other things that you can do to help prevent falls. This information is not intended to replace advice given to you by your health care provider. Make sure you  discuss any questions you have with your health care provider. Document Released: 08/05/2009 Document Revised: 03/16/2016 Document Reviewed: 11/13/2014 Elsevier Interactive Patient Education  2017 [employer].

## 2018-01-29 ENCOUNTER — Ambulatory Visit: Payer: Medicare HMO | Admitting: Family Medicine

## 2018-01-30 ENCOUNTER — Encounter: Payer: Self-pay | Admitting: Family Medicine

## 2018-01-30 ENCOUNTER — Ambulatory Visit (INDEPENDENT_AMBULATORY_CARE_PROVIDER_SITE_OTHER): Payer: Medicare HMO | Admitting: Family Medicine

## 2018-01-30 VITALS — BP 130/80 | HR 76 | Ht 67.0 in | Wt 221.0 lb

## 2018-01-30 DIAGNOSIS — R69 Illness, unspecified: Secondary | ICD-10-CM

## 2018-01-30 DIAGNOSIS — I679 Cerebrovascular disease, unspecified: Secondary | ICD-10-CM | POA: Diagnosis not present

## 2018-01-30 DIAGNOSIS — Z8679 Personal history of other diseases of the circulatory system: Secondary | ICD-10-CM | POA: Diagnosis not present

## 2018-01-30 DIAGNOSIS — I6789 Other cerebrovascular disease: Secondary | ICD-10-CM

## 2018-01-30 DIAGNOSIS — F339 Major depressive disorder, recurrent, unspecified: Secondary | ICD-10-CM

## 2018-01-30 DIAGNOSIS — E7849 Other hyperlipidemia: Secondary | ICD-10-CM | POA: Diagnosis not present

## 2018-01-30 DIAGNOSIS — I10 Essential (primary) hypertension: Secondary | ICD-10-CM | POA: Diagnosis not present

## 2018-01-30 MED ORDER — METOPROLOL SUCCINATE ER 100 MG PO TB24: 100.0000 mg | ORAL_TABLET | Freq: Every day | ORAL | 1 refills | Status: DC

## 2018-01-30 MED ORDER — LOVASTATIN 20 MG PO TABS: ORAL_TABLET | ORAL | 1 refills | Status: DC

## 2018-01-30 MED ORDER — VENLAFAXINE HCL ER 75 MG PO CP24: 75.0000 mg | ORAL_CAPSULE | Freq: Every day | ORAL | 1 refills | Status: DC

## 2018-01-30 MED ORDER — CLOPIDOGREL BISULFATE 75 MG PO TABS: 75.0000 mg | ORAL_TABLET | Freq: Every day | ORAL | 1 refills | Status: DC

## 2018-01-30 MED ORDER — LOSARTAN POTASSIUM-HCTZ 100-25 MG PO TABS: 1.0000 | ORAL_TABLET | Freq: Every day | ORAL | 1 refills | Status: DC

## 2018-01-30 NOTE — Progress Notes (Signed)
Name: Karina Leonard   MRN: [id number]    DOB: [birth year]   Date:01/30/2018       Progress Note  Subjective  Chief Complaint  Chief Complaint  Patient presents with  . Hypertension  . Hyperlipidemia  . Depression  . Coronary Artery Disease    takes plavix    Hypertension  This is a chronic problem. The current episode started more than 1 year ago. The problem is unchanged. The problem is controlled. Pertinent negatives include no anxiety, blurred vision, headaches, malaise/fatigue, neck pain, orthopnea, peripheral edema, PND or sweats. There are no associated agents to hypertension. Risk factors for coronary artery disease include dyslipidemia, diabetes mellitus, obesity and post-menopausal state. Past treatments include angiotensin blockers, diuretics and beta blockers. The current treatment provides moderate improvement. There are no compliance problems.  There is no history of angina, kidney disease, CAD/MI, CVA, heart failure, left ventricular hypertrophy, PVD or retinopathy. There is no history of chronic renal disease, a hypertension causing med or renovascular disease.  Hyperlipidemia  This is a chronic problem. The current episode started more than 1 year ago. The problem is controlled. Recent lipid tests were reviewed and are normal. She has no history of chronic renal disease, diabetes, hypothyroidism, liver disease or nephrotic syndrome. There are no known factors aggravating her hyperlipidemia. Pertinent negatives include no focal sensory loss, focal weakness, leg pain or myalgias. Current antihyperlipidemic treatment includes statins. Risk factors for coronary artery disease include diabetes mellitus, dyslipidemia and hypertension.  Depression         The patient presents with no depression.  This is a chronic problem.  The current episode started more than 1 year ago.   The problem has been waxing and waning since onset.  Associated symptoms include no decreased concentration, no  fatigue, no helplessness, no hopelessness, does not have insomnia, not irritable, no restlessness, no decreased interest, no appetite change, no body aches, no myalgias, no headaches, no indigestion, not sad and no suicidal ideas.     The symptoms are aggravated by nothing.  Past treatments include SNRIs - Serotonin and norepinephrine reuptake inhibitors.  Compliance with treatment is poor.  Previous treatment provided moderate relief.   Pertinent negatives include no hypothyroidism, no anxiety, no eating disorder, no depression and no head trauma. Neurologic Problem  The patient's pertinent negatives include no altered mental status, clumsiness, focal sensory loss, focal weakness, loss of balance, memory loss, near-syncope, slurred speech, syncope, visual change or weakness. This is a chronic problem. The current episode started more than 1 year ago. The neurological problem developed insidiously. The problem has been gradually improving since onset. There was no focality noted. Pertinent negatives include no fatigue, headaches or neck pain. Treatments tried: plavix. The treatment provided mild relief. There is no history of a bleeding disorder, a clotting disorder, a CVA, dementia, head trauma, liver disease, mood changes or seizures.    No problem-specific Assessment & Plan notes found for this encounter.   Past Medical History:  Diagnosis Date  . Allergy   . Cerebrovascular disease   . Depression   . Diabetes mellitus without complication ([place])   . Hyperlipidemia   . Hypertension   . Sciatica   . Stroke [facility])     Past Surgical History:  Procedure Laterality Date  . GALLBLADDER SURGERY  10/24/2007  . polyp removed     rectum- benign    Family History  Problem Relation Age of Onset  . Cancer Mother  uterine or ovarian. Unable to remember  . Cancer Father        lung  . Heart attack Sister     Social History   Socioeconomic History  . Marital status: Married    Spouse  name: Not on file  . Number of children: 3  . Years of education: some college  . Highest education level: 12th grade  Occupational History  . Occupation: Retired  Scientific laboratory technician  . Financial resource strain: Not hard at all  . Food insecurity:    Worry: Never true    Inability: Never true  . Transportation needs:    Medical: No    Non-medical: No  Tobacco Use  . Smoking status: Former Smoker    Packs/day: 1.00    Years: 20.00    Pack years: 20.00    Types: Cigarettes    Last attempt to quit: 1967    Years since quitting: 52.3  . Smokeless tobacco: Never Used  Substance and Sexual Activity  . Alcohol use: Yes    Alcohol/week: 0.0 oz    Comment: special occasions  . Drug use: No  . Sexual activity: Never  Lifestyle  . Physical activity:    Days per week: 3 days    Minutes per session: 60 min  . Stress: Not at all  Relationships  . Social connections:    Talks on phone: Patient refused    Gets together: Patient refused    Attends religious service: Patient refused    Active member of club or organization: Patient refused    Attends meetings of clubs or organizations: Patient refused    Relationship status: Married  . Intimate partner violence:    Fear of current or ex partner: No    Emotionally abused: No    Physically abused: No    Forced sexual activity: No  Other Topics Concern  . Not on file  Social History Narrative  . Not on file    Allergies  Allergen Reactions  . Amoxicillin Hives  . Penicillins     Outpatient Medications Prior to Visit  Medication Sig Dispense Refill  . fexofenadine (ALLEGRA) 180 MG tablet Take 180 mg by mouth daily. otc    . clopidogrel (PLAVIX) 75 MG tablet TAKE 1 TABLET (75 MG TOTAL) BY MOUTH DAILY. 90 tablet 0  . losartan-hydrochlorothiazide (HYZAAR) 100-25 MG tablet Take 1 tablet by mouth daily. 90 tablet 1  . lovastatin (MEVACOR) 20 MG tablet TAKE 2 TABLETS (40 MG TOTAL) BY MOUTH DAILY. NEED TO BE SEEN IN MARCH 180 tablet 0   . metoprolol succinate (TOPROL-XL) 100 MG 24 hr tablet TAKE 1 TABLET (100 MG TOTAL) BY MOUTH DAILY. 90 tablet 0  . venlafaxine XR (EFFEXOR-XR) 75 MG 24 hr capsule TAKE 1 CAPSULE (75 MG TOTAL) BY MOUTH DAILY. 90 capsule 0  . fexofenadine-pseudoephedrine (ALLEGRA-D 24) 180-240 MG 24 hr tablet Take 1 tablet by mouth daily. otc 30 tablet 11   No facility-administered medications prior to visit.     Review of Systems  Constitutional: Negative for appetite change, fatigue and malaise/fatigue.  Eyes: Negative for blurred vision.  Cardiovascular: Negative for orthopnea, PND and near-syncope.  Musculoskeletal: Negative for myalgias and neck pain.  Neurological: Negative for focal weakness, syncope, weakness, headaches and loss of balance.  Psychiatric/Behavioral: Positive for depression. Negative for decreased concentration, memory loss and suicidal ideas. The patient does not have insomnia.      Objective  Vitals:   01/30/18 1118  BP: 130/80  Pulse: 76  Weight: 221 lb (100.2 kg)  Height: 5\' 7"  (1.702 m)    Physical Exam  Constitutional: She is not irritable. No distress.  HENT:  Head: Normocephalic and atraumatic.  Right Ear: External ear normal.  Left Ear: External ear normal.  Nose: Nose normal.  Mouth/Throat: Oropharynx is clear and moist.  Eyes: Pupils are equal, round, and reactive to light. Conjunctivae and EOM are normal. Right eye exhibits no discharge. Left eye exhibits no discharge.  Neck: Normal range of motion. Neck supple. No JVD present. No thyromegaly present.  Cardiovascular: Normal rate, regular rhythm, normal heart sounds and intact distal pulses. Exam reveals no gallop and no friction rub.  No murmur heard. Pulmonary/Chest: Effort normal and breath sounds normal.  Abdominal: Soft. Bowel sounds are normal. She exhibits no mass. There is no tenderness. There is no guarding.  Musculoskeletal: Normal range of motion. She exhibits no edema.  Lymphadenopathy:    She  has no cervical adenopathy.  Neurological: She is alert. She has normal reflexes.  Skin: Skin is warm and dry. She is not diaphoretic.      Assessment & Plan  Problem List Items Addressed This Visit      Cardiovascular and Mediastinum   Essential (primary) hypertension - Primary   Relevant Medications   losartan-hydrochlorothiazide (HYZAAR) 100-25 MG tablet   lovastatin (MEVACOR) 20 MG tablet   metoprolol succinate (TOPROL-XL) 100 MG 24 hr tablet   Other Relevant Orders   Renal Function Panel   Cerebral microvascular disease   Relevant Medications   clopidogrel (PLAVIX) 75 MG tablet   losartan-hydrochlorothiazide (HYZAAR) 100-25 MG tablet   lovastatin (MEVACOR) 20 MG tablet   metoprolol succinate (TOPROL-XL) 100 MG 24 hr tablet     Other   Familial multiple lipoprotein-type hyperlipidemia   Relevant Medications   losartan-hydrochlorothiazide (HYZAAR) 100-25 MG tablet   lovastatin (MEVACOR) 20 MG tablet   metoprolol succinate (TOPROL-XL) 100 MG 24 hr tablet   Other Relevant Orders   Lipid Panel With LDL/HDL Ratio   Recurrent major depressive episodes (HCC)   Relevant Medications   venlafaxine XR (EFFEXOR-XR) 75 MG 24 hr capsule   H/O: HTN (hypertension)   Relevant Medications   losartan-hydrochlorothiazide (HYZAAR) 100-25 MG tablet   metoprolol succinate (TOPROL-XL) 100 MG 24 hr tablet   Other Relevant Orders   Renal Function Panel    Other Visit Diagnoses    Taking medication for chronic disease       Relevant Orders   Hepatic function panel      Meds ordered this encounter  Medications  . clopidogrel (PLAVIX) 75 MG tablet    Sig: Take 1 tablet (75 mg total) by mouth daily.    Dispense:  90 tablet    Refill:  1  . losartan-hydrochlorothiazide (HYZAAR) 100-25 MG tablet    Sig: Take 1 tablet by mouth daily.    Dispense:  90 tablet    Refill:  1  . lovastatin (MEVACOR) 20 MG tablet    Sig: TAKE 2 TABLETS (40 MG TOTAL) BY MOUTH DAILY.    Dispense:  180  tablet    Refill:  1  . metoprolol succinate (TOPROL-XL) 100 MG 24 hr tablet    Sig: Take 1 tablet (100 mg total) by mouth daily.    Dispense:  90 tablet    Refill:  1  . venlafaxine XR (EFFEXOR-XR) 75 MG 24 hr capsule    Sig: Take 1 capsule (75 mg total) by mouth daily.  Dispense:  90 capsule    Refill:  1      Dr. Otilio Miu [facility] [place] [facility]  01/30/18

## 2018-01-31 LAB — LIPID PANEL WITH LDL/HDL RATIO
Cholesterol, Total: 162 mg/dL (ref 100–199)
HDL: 56 mg/dL (ref >39)
LDL Calculated: 93 mg/dL (ref 0–99)
LDl/HDL Ratio: 1.7 ratio (ref 0.0–3.2)
TRIGLYCERIDES: 64 mg/dL (ref 0–149)
VLDL CHOLESTEROL CAL: 13 mg/dL (ref 5–40)

## 2018-01-31 LAB — RENAL FUNCTION PANEL
ALBUMIN: 4 g/dL (ref 3.5–4.8)
BUN/Creatinine Ratio: 14 (ref 12–28)
BUN: 16 mg/dL (ref 8–27)
CHLORIDE: 101 mmol/L (ref 96–106)
CO2: 27 mmol/L (ref 20–29)
Calcium: 9 mg/dL (ref 8.7–10.3)
Creatinine, Ser: 1.18 mg/dL — ABNORMAL HIGH (ref 0.57–1.00)
GFR calc non Af Amer: 46 mL/min/{1.73_m2} — ABNORMAL LOW (ref >59)
GFR, EST AFRICAN AMERICAN: 53 mL/min/{1.73_m2} — AB (ref >59)
Glucose: 100 mg/dL — ABNORMAL HIGH (ref 65–99)
PHOSPHORUS: 3.1 mg/dL (ref 2.5–4.5)
Potassium: 4.2 mmol/L (ref 3.5–5.2)
Sodium: 142 mmol/L (ref 134–144)

## 2018-01-31 LAB — HEPATIC FUNCTION PANEL
ALK PHOS: 105 IU/L (ref 39–117)
ALT: 7 IU/L (ref 0–32)
AST: 16 IU/L (ref 0–40)
Bilirubin Total: 0.4 mg/dL (ref 0.0–1.2)
Bilirubin, Direct: 0.11 mg/dL (ref 0.00–0.40)
TOTAL PROTEIN: 6.5 g/dL (ref 6.0–8.5)

## 2018-07-22 ENCOUNTER — Encounter: Payer: Self-pay | Admitting: Family Medicine

## 2018-07-22 ENCOUNTER — Ambulatory Visit (INDEPENDENT_AMBULATORY_CARE_PROVIDER_SITE_OTHER): Payer: Medicare HMO | Admitting: Family Medicine

## 2018-07-22 VITALS — BP 138/70 | HR 64 | Ht 67.0 in | Wt 221.0 lb

## 2018-07-22 DIAGNOSIS — I679 Cerebrovascular disease, unspecified: Secondary | ICD-10-CM

## 2018-07-22 DIAGNOSIS — Z8679 Personal history of other diseases of the circulatory system: Secondary | ICD-10-CM

## 2018-07-22 DIAGNOSIS — I10 Essential (primary) hypertension: Secondary | ICD-10-CM | POA: Diagnosis not present

## 2018-07-22 DIAGNOSIS — J301 Allergic rhinitis due to pollen: Secondary | ICD-10-CM | POA: Diagnosis not present

## 2018-07-22 DIAGNOSIS — Z23 Encounter for immunization: Secondary | ICD-10-CM | POA: Diagnosis not present

## 2018-07-22 DIAGNOSIS — I6789 Other cerebrovascular disease: Secondary | ICD-10-CM

## 2018-07-22 DIAGNOSIS — F339 Major depressive disorder, recurrent, unspecified: Secondary | ICD-10-CM | POA: Diagnosis not present

## 2018-07-22 DIAGNOSIS — E7849 Other hyperlipidemia: Secondary | ICD-10-CM | POA: Diagnosis not present

## 2018-07-22 MED ORDER — METOPROLOL SUCCINATE ER 100 MG PO TB24: 100.0000 mg | ORAL_TABLET | Freq: Every day | ORAL | 1 refills | Status: DC

## 2018-07-22 MED ORDER — FEXOFENADINE HCL 180 MG PO TABS: 180.0000 mg | ORAL_TABLET | Freq: Every day | ORAL | 1 refills | Status: AC

## 2018-07-22 MED ORDER — CLOPIDOGREL BISULFATE 75 MG PO TABS: 75.0000 mg | ORAL_TABLET | Freq: Every day | ORAL | 1 refills | Status: DC

## 2018-07-22 MED ORDER — VENLAFAXINE HCL ER 75 MG PO CP24: 75.0000 mg | ORAL_CAPSULE | Freq: Every day | ORAL | 1 refills | Status: DC

## 2018-07-22 MED ORDER — LOSARTAN POTASSIUM-HCTZ 100-25 MG PO TABS: 1.0000 | ORAL_TABLET | Freq: Every day | ORAL | 1 refills | Status: DC

## 2018-07-22 MED ORDER — LOVASTATIN 20 MG PO TABS: ORAL_TABLET | ORAL | 1 refills | Status: DC

## 2018-07-22 NOTE — Progress Notes (Signed)
Date:  07/22/2018   Name:  Karina Leonard   DOB:  Mar 07, 1941   MRN:  [id number]   Chief Complaint: Hypertension; Hyperlipidemia; Depression (PHQ-0); Cerebrovascular Accident (takes plavix); and Flu Vaccine Hypertension  This is a chronic problem. The current episode started more than 1 year ago. The problem has been gradually improving since onset. The problem is controlled. Pertinent negatives include no anxiety, blurred vision, chest pain, headaches, malaise/fatigue, neck pain, orthopnea, palpitations, peripheral edema, PND, shortness of breath or sweats. There are no associated agents to hypertension. Risk factors for coronary artery disease include dyslipidemia, diabetes mellitus, female gender and obesity. Past treatments include beta blockers, angiotensin blockers and diuretics. The current treatment provides moderate improvement. There are no compliance problems.  There is no history of angina, kidney disease, CAD/MI, CVA, heart failure, left ventricular hypertrophy, PVD or retinopathy. There is no history of chronic renal disease, a hypertension causing med or renovascular disease.  Hyperlipidemia  This is a chronic problem. The current episode started more than 1 year ago. The problem is controlled. Recent lipid tests were reviewed and are normal. She has no history of chronic renal disease, diabetes, hypothyroidism, liver disease, obesity or nephrotic syndrome. Factors aggravating her hyperlipidemia include thiazides. Pertinent negatives include no chest pain, focal sensory loss, focal weakness, leg pain, myalgias or shortness of breath. Current antihyperlipidemic treatment includes statins. The current treatment provides moderate improvement of lipids. There are no compliance problems.  Risk factors for coronary artery disease include diabetes mellitus, dyslipidemia, hypertension and obesity.  Depression       The patient presents with depression.  This is a chronic problem.  The current  episode started more than 1 year ago.   The onset quality is gradual.   The problem occurs intermittently.  The problem has been waxing and waning since onset.  Associated symptoms include no decreased concentration, no fatigue, no helplessness, no hopelessness, does not have insomnia, not irritable, no restlessness, no decreased interest, no appetite change, no body aches, no myalgias, no headaches, no indigestion, not sad and no suicidal ideas.     The symptoms are aggravated by nothing.  Past treatments include SSRIs - Selective serotonin reuptake inhibitors.  Compliance with treatment is good.  Previous treatment provided mild relief.  Past medical history includes depression.     Pertinent negatives include no chronic pain, no hypothyroidism and no anxiety. Cerebrovascular Accident  This is a chronic problem. The current episode started more than 1 year ago. The problem occurs daily. The problem has been gradually improving. Pertinent negatives include no abdominal pain, anorexia, arthralgias, chest pain, chills, congestion, coughing, fatigue, fever, headaches, myalgias, nausea, neck pain, numbness, rash, sore throat, vertigo, vomiting or weakness. Treatments tried: asa. The treatment provided moderate relief.     Review of Systems  Constitutional: Negative.  Negative for appetite change, chills, fatigue, fever, malaise/fatigue and unexpected weight change.  HENT: Negative for congestion, ear discharge, ear pain, rhinorrhea, sinus pressure, sneezing and sore throat.   Eyes: Negative for blurred vision, photophobia, pain, discharge, redness and itching.  Respiratory: Negative for cough, shortness of breath, wheezing and stridor.   Cardiovascular: Negative for chest pain, palpitations, orthopnea and PND.  Gastrointestinal: Negative for abdominal pain, anorexia, blood in stool, constipation, diarrhea, nausea and vomiting.  Endocrine: Negative for cold intolerance, heat intolerance, polydipsia,  polyphagia and polyuria.  Genitourinary: Negative for dysuria, flank pain, frequency, hematuria, menstrual problem, pelvic pain, urgency, vaginal bleeding and vaginal discharge.  Musculoskeletal: Negative for  arthralgias, back pain, myalgias and neck pain.  Skin: Negative for rash.  Allergic/Immunologic: Negative for environmental allergies and food allergies.  Neurological: Negative for dizziness, vertigo, focal weakness, weakness, light-headedness, numbness and headaches.  Hematological: Negative for adenopathy. Does not bruise/bleed easily.  Psychiatric/Behavioral: Positive for depression. Negative for decreased concentration, dysphoric mood and suicidal ideas. The patient is not nervous/anxious and does not have insomnia.     Patient Active Problem List   Diagnosis Date Noted  . Renal disease due to diabetes mellitus ([place]) 07/26/2015  . Cerebral microvascular disease 07/23/2015  . Familial multiple lipoprotein-type hyperlipidemia 02/16/2015  . Anxiety attack 02/16/2015  . Recurrent major depressive episodes ([place]) 02/16/2015  . Essential (primary) hypertension 02/16/2015  . H/O: HTN (hypertension) 02/16/2015    Allergies  Allergen Reactions  . Amoxicillin Hives  . Penicillins     Past Surgical History:  Procedure Laterality Date  . GALLBLADDER SURGERY  10/24/2007  . polyp removed     rectum- benign    Social History   Tobacco Use  . Smoking status: Former Smoker    Packs/day: 1.00    Years: 20.00    Pack years: 20.00    Types: Cigarettes    Last attempt to quit: 1967    Years since quitting: 52.7  . Smokeless tobacco: Never Used  Substance Use Topics  . Alcohol use: Yes    Alcohol/week: 0.0 standard drinks    Comment: special occasions  . Drug use: No     Medication list has been reviewed and updated.  Current Meds  Medication Sig  . clopidogrel (PLAVIX) 75 MG tablet Take 1 tablet (75 mg total) by mouth daily.  . fexofenadine (ALLEGRA) 180 MG tablet Take 1  tablet (180 mg total) by mouth daily. otc  . losartan-hydrochlorothiazide (HYZAAR) 100-25 MG tablet Take 1 tablet by mouth daily.  Marland Kitchen lovastatin (MEVACOR) 20 MG tablet TAKE 2 TABLETS (40 MG TOTAL) BY MOUTH DAILY.  . metoprolol succinate (TOPROL-XL) 100 MG 24 hr tablet Take 1 tablet (100 mg total) by mouth daily.  Marland Kitchen venlafaxine XR (EFFEXOR-XR) 75 MG 24 hr capsule Take 1 capsule (75 mg total) by mouth daily.  . [DISCONTINUED] clopidogrel (PLAVIX) 75 MG tablet Take 1 tablet (75 mg total) by mouth daily.  . [DISCONTINUED] fexofenadine (ALLEGRA) 180 MG tablet Take 180 mg by mouth daily. otc  . [DISCONTINUED] losartan-hydrochlorothiazide (HYZAAR) 100-25 MG tablet Take 1 tablet by mouth daily.  . [DISCONTINUED] lovastatin (MEVACOR) 20 MG tablet TAKE 2 TABLETS (40 MG TOTAL) BY MOUTH DAILY.  . [DISCONTINUED] metoprolol succinate (TOPROL-XL) 100 MG 24 hr tablet Take 1 tablet (100 mg total) by mouth daily.  . [DISCONTINUED] venlafaxine XR (EFFEXOR-XR) 75 MG 24 hr capsule Take 1 capsule (75 mg total) by mouth daily.    PHQ 2/9 Scores 07/22/2018 01/24/2018 01/24/2018 07/18/2017  PHQ - 2 Score 0 0 0 0  PHQ- 9 Score - 0 - 0    Physical Exam  Constitutional: She is oriented to person, place, and time. She appears well-developed and well-nourished. She is not irritable.  HENT:  Head: Normocephalic.  Right Ear: External ear normal.  Left Ear: External ear normal.  Mouth/Throat: Oropharynx is clear and moist.  Eyes: Pupils are equal, round, and reactive to light. Conjunctivae and EOM are normal. Lids are everted and swept, no foreign bodies found. Left eye exhibits no hordeolum. No foreign body present in the left eye. Right conjunctiva is not injected. Left conjunctiva is not injected. No scleral icterus.  Neck: Normal range of motion. Neck supple. No JVD present. No tracheal deviation present. No thyromegaly present.  Cardiovascular: Normal rate, regular rhythm, S1 normal, S2 normal, normal heart sounds and  intact distal pulses. Exam reveals no gallop, no S3, no S4, no distant heart sounds and no friction rub.  No murmur heard.  No systolic murmur is present.  No diastolic murmur is present. Pulmonary/Chest: Effort normal and breath sounds normal. No respiratory distress. She has no wheezes. She has no rales.  Abdominal: Soft. Bowel sounds are normal. She exhibits no mass. There is no hepatosplenomegaly. There is no tenderness. There is no rebound and no guarding.  Musculoskeletal: Normal range of motion. She exhibits no edema or tenderness.  Lymphadenopathy:    She has no cervical adenopathy.  Neurological: She is alert and oriented to person, place, and time. She has normal strength and normal reflexes. She is not disoriented. She displays normal reflexes. No cranial nerve deficit or sensory deficit.  Skin: Skin is warm. No rash noted.  Psychiatric: She has a normal mood and affect. Her mood appears not anxious. She does not exhibit a depressed mood.  Nursing note and vitals reviewed.   BP 138/70   Pulse 64   Ht 5\' 7"  (1.702 m)   Wt 221 lb (100.2 kg)   BMI 34.61 kg/m   Assessment and Plan: 1. Cerebral microvascular disease Stable on meds- continue Plavix as directed - clopidogrel (PLAVIX) 75 MG tablet; Take 1 tablet (75 mg total) by mouth daily.  Dispense: 90 tablet; Refill: 1  2. Essential (primary) hypertension Stable on meds- refill metoprolol and losartan-HCTZ/ draw renal panel - metoprolol succinate (TOPROL-XL) 100 MG 24 hr tablet; Take 1 tablet (100 mg total) by mouth daily.  Dispense: 90 tablet; Refill: 1 - losartan-hydrochlorothiazide (HYZAAR) 100-25 MG tablet; Take 1 tablet by mouth daily.  Dispense: 90 tablet; Refill: 1 - Renal Function Panel  3. H/O: HTN (hypertension) Stable on meds- refill metoprolol and losartan-HCTZ/ draw renal panel - metoprolol succinate (TOPROL-XL) 100 MG 24 hr tablet; Take 1 tablet (100 mg total) by mouth daily.  Dispense: 90 tablet; Refill: 1 -  losartan-hydrochlorothiazide (HYZAAR) 100-25 MG tablet; Take 1 tablet by mouth daily.  Dispense: 90 tablet; Refill: 1  4. Familial multiple lipoprotein-type hyperlipidemia Stable on med- refill lovastatin/ draw lipids - lovastatin (MEVACOR) 20 MG tablet; TAKE 2 TABLETS (40 MG TOTAL) BY MOUTH DAILY.  Dispense: 180 tablet; Refill: 1 - Lipid panel  5. Recurrent major depressive episodes (HCC) Stable on meds- refill venlafaxine - venlafaxine XR (EFFEXOR-XR) 75 MG 24 hr capsule; Take 1 capsule (75 mg total) by mouth daily.  Dispense: 90 capsule; Refill: 1  6. Seasonal allergic rhinitis due to pollen Stable on med- continue otc fexofenadine - fexofenadine (ALLEGRA) 180 MG tablet; Take 1 tablet (180 mg total) by mouth daily. otc  Dispense: 90 tablet; Refill: 1  7. Flu vaccine need administered - Flu vaccine HIGH DOSE PF (Fluzone High dose)   Dr. Macon [facility]  07/22/2018

## 2018-07-23 LAB — LIPID PANEL
CHOL/HDL RATIO: 3 ratio (ref 0.0–4.4)
Cholesterol, Total: 169 mg/dL (ref 100–199)
HDL: 57 mg/dL (ref >39)
LDL CALC: 96 mg/dL (ref 0–99)
Triglycerides: 80 mg/dL (ref 0–149)
VLDL CHOLESTEROL CAL: 16 mg/dL (ref 5–40)

## 2018-07-23 LAB — RENAL FUNCTION PANEL
Albumin: 4 g/dL (ref 3.5–4.8)
BUN / CREAT RATIO: 13 (ref 12–28)
BUN: 16 mg/dL (ref 8–27)
CHLORIDE: 99 mmol/L (ref 96–106)
CO2: 25 mmol/L (ref 20–29)
Calcium: 8.9 mg/dL (ref 8.7–10.3)
Creatinine, Ser: 1.22 mg/dL — ABNORMAL HIGH (ref 0.57–1.00)
GFR, EST AFRICAN AMERICAN: 50 mL/min/{1.73_m2} — AB (ref >59)
GFR, EST NON AFRICAN AMERICAN: 43 mL/min/{1.73_m2} — AB (ref >59)
Glucose: 98 mg/dL (ref 65–99)
Phosphorus: 3.2 mg/dL (ref 2.5–4.5)
Potassium: 4.2 mmol/L (ref 3.5–5.2)
Sodium: 144 mmol/L (ref 134–144)

## 2018-10-20 ENCOUNTER — Ambulatory Visit
Admission: EM | Admit: 2018-10-20 | Discharge: 2018-10-20 | Disposition: A | Discharge status: Home / Self Care | Payer: Medicare HMO | Attending: Student | Admitting: Student

## 2018-10-20 ENCOUNTER — Other Ambulatory Visit: Payer: Self-pay

## 2018-10-20 ENCOUNTER — Encounter: Payer: Self-pay | Admitting: Emergency Medicine

## 2018-10-20 DIAGNOSIS — B9689 Other specified bacterial agents as the cause of diseases classified elsewhere: Secondary | ICD-10-CM | POA: Insufficient documentation

## 2018-10-20 DIAGNOSIS — J069 Acute upper respiratory infection, unspecified: Secondary | ICD-10-CM | POA: Insufficient documentation

## 2018-10-20 DIAGNOSIS — J329 Chronic sinusitis, unspecified: Secondary | ICD-10-CM | POA: Insufficient documentation

## 2018-10-20 MED ORDER — AZITHROMYCIN 250 MG PO TABS: ORAL_TABLET | ORAL | 0 refills | Status: DC

## 2018-10-20 MED ORDER — BENZONATATE 100 MG PO CAPS: 100.0000 mg | ORAL_CAPSULE | Freq: Three times a day (TID) | ORAL | 0 refills | Status: DC

## 2018-10-20 MED ORDER — HYDROCODONE-HOMATROPINE 5-1.5 MG/5ML PO SYRP: 5.0000 mL | ORAL_SOLUTION | Freq: Every evening | ORAL | 0 refills | Status: DC | PRN

## 2018-10-20 NOTE — ED Triage Notes (Signed)
Patient c/o cough and nasal congestion that started 5 days ago. She states she has been taking Allegra for her symptoms. Patient started using saline nasal washes without any improvement. Denies fever.

## 2018-10-20 NOTE — Discharge Instructions (Signed)
-  Take medications as prescribed. -Follow-up with [facility] if symptoms worsen or fail to improve.

## 2018-10-21 NOTE — ED Provider Notes (Signed)
[facility]    CSN: [id number] Arrival date & time: 10/20/18  1412     History   Chief Complaint Chief Complaint  Patient presents with  . Cough  . Nasal Congestion    HPI Karina Leonard is a 77 y.o. female who presents today for a 6-day history of facial pressure, cough, postnasal drift and reported fevers at home.  She denies any recent travels.  She states that she has ran a temperature up to 100 degrees, she has been taking Allegra-D in addition to other allergy medications without relief.  She does feel that she is having drainage running into the back of her throat, she does report a dry cough, no production.  Denies any blood in the sputum, denies any night sweats.  She states that the facial pressure that she was previously experiencing has decreased, reports a mild sore throat as well.  HPI  Past Medical History:  Diagnosis Date  . Allergy   . Cerebrovascular disease   . Depression   . Diabetes mellitus without complication ([place])   . Hyperlipidemia   . Hypertension   . Sciatica   . Stroke [facility])     Patient Active Problem List   Diagnosis Date Noted  . Renal disease due to diabetes mellitus ([place]) 07/26/2015  . Cerebral microvascular disease 07/23/2015  . Familial multiple lipoprotein-type hyperlipidemia 02/16/2015  . Anxiety attack 02/16/2015  . Recurrent major depressive episodes ([place]) 02/16/2015  . Essential (primary) hypertension 02/16/2015  . H/O: HTN (hypertension) 02/16/2015    Past Surgical History:  Procedure Laterality Date  . GALLBLADDER SURGERY  10/24/2007  . polyp removed     rectum- benign    OB History   No obstetric history on file.      Home Medications    Prior to Admission medications   Medication Sig Start Date End Date Taking? Authorizing Provider  clopidogrel (PLAVIX) 75 MG tablet Take 1 tablet (75 mg total) by mouth daily. 07/22/18  Yes Juline Patch, MD  fexofenadine (ALLEGRA) 180 MG tablet Take 1 tablet (180  mg total) by mouth daily. otc 07/22/18  Yes Juline Patch, MD  losartan-hydrochlorothiazide (HYZAAR) 100-25 MG tablet Take 1 tablet by mouth daily. 07/22/18  Yes Juline Patch, MD  lovastatin (MEVACOR) 20 MG tablet TAKE 2 TABLETS (40 MG TOTAL) BY MOUTH DAILY. 07/22/18  Yes Juline Patch, MD  metoprolol succinate (TOPROL-XL) 100 MG 24 hr tablet Take 1 tablet (100 mg total) by mouth daily. 07/22/18  Yes Juline Patch, MD  venlafaxine XR (EFFEXOR-XR) 75 MG 24 hr capsule Take 1 capsule (75 mg total) by mouth daily. 07/22/18  Yes Juline Patch, MD  azithromycin (ZITHROMAX Z-PAK) 250 MG tablet Take per package instructions. 10/20/18   Lattie Corns, PA-C  benzonatate (TESSALON) 100 MG capsule Take 1 capsule (100 mg total) by mouth every 8 (eight) hours. 10/20/18   Lattie Corns, PA-C  HYDROcodone-homatropine [facility]) 5-1.5 MG/5ML syrup Take 5 mLs by mouth at bedtime as needed for cough. 10/20/18   Lattie Corns, PA-C    Family History Family History  Problem Relation Age of Onset  . Cancer Mother        uterine or ovarian. Unable to remember  . Cancer Father        lung  . Heart attack Sister     Social History Social History   Tobacco Use  . Smoking status: Former Smoker    Packs/day: 1.00  Years: 20.00    Pack years: 20.00    Types: Cigarettes    Last attempt to quit: 1967    Years since quitting: 53.0  . Smokeless tobacco: Never Used  Substance Use Topics  . Alcohol use: Yes    Alcohol/week: 0.0 standard drinks    Comment: special occasions  . Drug use: No     Allergies   Amoxicillin and Penicillins   Review of Systems Review of Systems  HENT: Positive for congestion, postnasal drip, rhinorrhea, sinus pain and sore throat.   Respiratory: Positive for cough.   Cardiovascular: Negative for chest pain.  All other systems reviewed and are negative.  Physical Exam Triage Vital Signs ED Triage Vitals  Enc Vitals Group     BP 10/20/18 1510 (!)  160/70     Pulse Rate 10/20/18 1506 62     Resp 10/20/18 1506 18     Temp 10/20/18 1506 98.4 F (36.9 C)     Temp Source 10/20/18 1506 Oral     SpO2 10/20/18 1506 98 %     Weight 10/20/18 1508 210 lb (95.3 kg)     Height 10/20/18 1508 5\' 7"  (1.702 m)     Head Circumference --      Peak Flow --      Pain Score 10/20/18 1508 0     Pain Loc --      Pain Edu? --      Excl. in [place]? --    No data found.  Updated Vital Signs BP (!) 160/70 (BP Location: Right Arm)   Pulse 62   Temp 98.4 F (36.9 C) (Oral)   Resp 18   Ht 5\' 7"  (1.702 m)   Wt 210 lb (95.3 kg)   SpO2 98%   BMI 32.89 kg/m   Visual Acuity Right Eye Distance:   Left Eye Distance:   Bilateral Distance:    Right Eye Near:   Left Eye Near:    Bilateral Near:     Physical Exam Constitutional:      General: She is not in acute distress.    Appearance: She is not diaphoretic.  HENT:     Head: Normocephalic.     Right Ear: Ear canal normal.     Left Ear: Ear canal normal.     Ears:     Comments: Mild bulging of bilateral tympanic membranes without evidence for infection.    Nose:     Comments: Bilateral nasal canal erythema.    Mouth/Throat:     Comments: Mild erythema to the posterior oropharynx without exudate or purulent drainage. Eyes:     Extraocular Movements: Extraocular movements intact.     Pupils: Pupils are equal, round, and reactive to light.  Cardiovascular:     Rate and Rhythm: Normal rate. Rhythm irregular.     Heart sounds: No murmur. No friction rub. No gallop.   Pulmonary:     Effort: Pulmonary effort is normal.     Breath sounds: Wheezing present. No rhonchi or rales.  Lymphadenopathy:     Cervical: No cervical adenopathy.  Neurological:     Mental Status: She is alert.    UC Treatments / Results  Labs (all labs ordered are listed, but only abnormal results are displayed) Labs Reviewed - No data to display  EKG None  Radiology No results found.  Procedures Procedures  (including critical care time)  Medications Ordered in UC Medications - No data to display  Initial Impression / Assessment  and Plan / UC Course  I have reviewed the triage vital signs and the nursing notes.  Pertinent labs & imaging results that were available during my care of the patient were reviewed by me and considered in my medical decision making (see chart for details).     1.  Treatment options were discussed today with the patient. 2.  The patient symptoms are approaching going on for over a week without relief with over-the-counter medications. 3.  The patient was prescribed a Z-Pak, Tessalon Perles for cough in addition to Hycodan cough syrup for nighttime symptoms. 4.  The patient will follow-up if she does not notice any improvement of her symptoms over the next several weeks. Final Clinical Impressions(s) / UC Diagnoses   Final diagnoses:  Upper respiratory tract infection, unspecified type  Bacterial sinusitis     Discharge Instructions     -Take medications as prescribed. -Follow-up with [facility] if symptoms worsen or fail to improve.   ED Prescriptions    Medication Sig Dispense Auth. Provider   azithromycin (ZITHROMAX Z-PAK) 250 MG tablet Take per package instructions. 6 each Lattie Corns, PA-C   benzonatate (TESSALON) 100 MG capsule Take 1 capsule (100 mg total) by mouth every 8 (eight) hours. 21 capsule Damaris Hippo Lance, PA-C   HYDROcodone-homatropine [facility]) 5-1.5 MG/5ML syrup Take 5 mLs by mouth at bedtime as needed for cough. 120 mL Lattie Corns, PA-C     Controlled Substance Prescriptions [place] Controlled Substance Registry consulted? Not Applicable   Lattie Corns, PA-C 10/21/18 1004

## 2018-12-09 ENCOUNTER — Other Ambulatory Visit: Payer: Self-pay

## 2018-12-09 DIAGNOSIS — F339 Major depressive disorder, recurrent, unspecified: Secondary | ICD-10-CM

## 2018-12-09 MED ORDER — VENLAFAXINE HCL ER 75 MG PO CP24: 75.0000 mg | ORAL_CAPSULE | Freq: Every day | ORAL | 0 refills | Status: DC

## 2019-01-27 ENCOUNTER — Ambulatory Visit: Payer: Medicare HMO

## 2019-03-18 ENCOUNTER — Ambulatory Visit (INDEPENDENT_AMBULATORY_CARE_PROVIDER_SITE_OTHER): Payer: Medicare HMO | Admitting: Family Medicine

## 2019-03-18 ENCOUNTER — Encounter: Payer: Self-pay | Admitting: Family Medicine

## 2019-03-18 ENCOUNTER — Other Ambulatory Visit: Payer: Self-pay

## 2019-03-18 VITALS — BP 123/81 | HR 63 | Resp 16 | Ht 67.0 in | Wt 213.0 lb

## 2019-03-18 DIAGNOSIS — R69 Illness, unspecified: Secondary | ICD-10-CM | POA: Diagnosis not present

## 2019-03-18 DIAGNOSIS — I10 Essential (primary) hypertension: Secondary | ICD-10-CM | POA: Diagnosis not present

## 2019-03-18 DIAGNOSIS — I679 Cerebrovascular disease, unspecified: Secondary | ICD-10-CM

## 2019-03-18 DIAGNOSIS — E7849 Other hyperlipidemia: Secondary | ICD-10-CM | POA: Diagnosis not present

## 2019-03-18 DIAGNOSIS — I2510 Atherosclerotic heart disease of native coronary artery without angina pectoris: Secondary | ICD-10-CM | POA: Insufficient documentation

## 2019-03-18 DIAGNOSIS — F339 Major depressive disorder, recurrent, unspecified: Secondary | ICD-10-CM

## 2019-03-18 DIAGNOSIS — H25019 Cortical age-related cataract, unspecified eye: Secondary | ICD-10-CM | POA: Insufficient documentation

## 2019-03-18 DIAGNOSIS — I6789 Other cerebrovascular disease: Secondary | ICD-10-CM

## 2019-03-18 DIAGNOSIS — Z8639 Personal history of other endocrine, nutritional and metabolic disease: Secondary | ICD-10-CM | POA: Insufficient documentation

## 2019-03-18 DIAGNOSIS — Z23 Encounter for immunization: Secondary | ICD-10-CM | POA: Insufficient documentation

## 2019-03-18 DIAGNOSIS — E661 Drug-induced obesity: Secondary | ICD-10-CM | POA: Insufficient documentation

## 2019-03-18 MED ORDER — VENLAFAXINE HCL ER 75 MG PO CP24: 75.0000 mg | ORAL_CAPSULE | Freq: Every day | ORAL | 1 refills | Status: DC

## 2019-03-18 MED ORDER — METOPROLOL SUCCINATE ER 100 MG PO TB24: 100.0000 mg | ORAL_TABLET | Freq: Every day | ORAL | 1 refills | Status: DC

## 2019-03-18 MED ORDER — LOVASTATIN 20 MG PO TABS: ORAL_TABLET | ORAL | 1 refills | Status: DC

## 2019-03-18 MED ORDER — CLOPIDOGREL BISULFATE 75 MG PO TABS: 75.0000 mg | ORAL_TABLET | Freq: Every day | ORAL | 1 refills | Status: DC

## 2019-03-18 MED ORDER — LOSARTAN POTASSIUM-HCTZ 100-25 MG PO TABS: 1.0000 | ORAL_TABLET | Freq: Every day | ORAL | 1 refills | Status: DC

## 2019-03-18 NOTE — Progress Notes (Signed)
Date:  03/18/2019   Name:  Karina Leonard   DOB:  [birth year]   MRN:  [id number]   Chief Complaint: Hypertension; Hyperlipidemia; and Coagulation Disorder (been out of meds x 1 week. )  Hypertension  This is a chronic problem. The current episode started more than 1 year ago. The problem is unchanged. The problem is controlled. Pertinent negatives include no anxiety, blurred vision, chest pain, headaches, malaise/fatigue, neck pain, orthopnea, palpitations, peripheral edema, PND or shortness of breath. There are no associated agents to hypertension. Risk factors for coronary artery disease include dyslipidemia. Past treatments include beta blockers, angiotensin blockers and diuretics. The current treatment provides moderate improvement. There are no compliance problems.  There is no history of angina, kidney disease, CAD/MI, CVA, heart failure, left ventricular hypertrophy, PVD or retinopathy. There is no history of chronic renal disease, a hypertension causing med or renovascular disease.  Hyperlipidemia  This is a chronic problem. The current episode started more than 1 year ago. The problem is controlled. Recent lipid tests were reviewed and are normal. She has no history of chronic renal disease, diabetes, hypothyroidism, liver disease, obesity or nephrotic syndrome. Pertinent negatives include no chest pain, focal sensory loss, focal weakness, leg pain, myalgias or shortness of breath. Current antihyperlipidemic treatment includes statins. The current treatment provides moderate improvement of lipids. There are no compliance problems.  Risk factors for coronary artery disease include dyslipidemia and hypertension.  Depression       (For cerebral vascular disease)  This is a chronic problem.  The current episode started more than 1 year ago.   The onset quality is gradual.   The problem occurs intermittently.  The problem has been waxing and waning since onset.  Associated symptoms include no  decreased concentration, no fatigue, no helplessness, no hopelessness, does not have insomnia, not irritable, no restlessness, no decreased interest, no appetite change, no body aches, no myalgias, no headaches, no indigestion, not sad and no suicidal ideas.  Past treatments include SSRIs - Selective serotonin reuptake inhibitors.  Compliance with treatment is good.  Previous treatment provided moderate relief.   Pertinent negatives include no hypothyroidism, no brain trauma, no dementia and no anxiety. Neurologic Problem  The patient's pertinent negatives include no altered mental status, clumsiness, focal sensory loss, focal weakness, loss of balance, memory loss, near-syncope, slurred speech, syncope, visual change or weakness. Primary symptoms comment: for cerebral vascular disease. This is a chronic problem. The current episode started more than 1 year ago. The problem is unchanged. Pertinent negatives include no abdominal pain, back pain, chest pain, dizziness, fatigue, fever, headaches, nausea, neck pain, palpitations or shortness of breath. There is no history of liver disease.    Review of Systems  Constitutional: Negative for appetite change, chills, fatigue, fever and malaise/fatigue.  HENT: Negative for drooling, ear discharge, ear pain and sore throat.   Eyes: Negative for blurred vision.  Respiratory: Negative for cough, shortness of breath and wheezing.   Cardiovascular: Negative for chest pain, palpitations, orthopnea, leg swelling, PND and near-syncope.  Gastrointestinal: Negative for abdominal pain, blood in stool, constipation, diarrhea and nausea.  Endocrine: Negative for polydipsia.  Genitourinary: Negative for dysuria, frequency, hematuria and urgency.  Musculoskeletal: Negative for back pain, myalgias and neck pain.  Skin: Negative for rash.  Allergic/Immunologic: Negative for environmental allergies.  Neurological: Negative for dizziness, focal weakness, syncope, weakness,  headaches and loss of balance.  Hematological: Does not bruise/bleed easily.  Psychiatric/Behavioral: Positive for depression. Negative for decreased  concentration, memory loss and suicidal ideas. The patient is not nervous/anxious and does not have insomnia.     Patient Active Problem List   Diagnosis Date Noted  . Need for vaccination 03/18/2019  . H/O hypercholesterolemia 03/18/2019  . Drug-induced obesity 03/18/2019  . Coronary artery disease 03/18/2019  . Cataract cortical, senile 03/18/2019  . Numbness and tingling of right upper extremity 08/15/2016  . Lipoma of right upper extremity 08/15/2016  . Renal disease due to diabetes mellitus ([place]) 07/26/2015  . Cerebral microvascular disease 07/23/2015  . Familial multiple lipoprotein-type hyperlipidemia 02/16/2015  . Anxiety attack 02/16/2015  . Recurrent major depressive episodes ([place]) 02/16/2015  . Essential (primary) hypertension 02/16/2015  . H/O: HTN (hypertension) 02/16/2015  . Asymptomatic hypertensive urgency 11/14/2014  . Tubulovillous adenoma of rectum 10/21/2014  . Depression 10/21/2014  . Nuclear sclerosis of both eyes 07/07/2014    Allergies  Allergen Reactions  . Amoxicillin Hives  . Penicillins     Past Surgical History:  Procedure Laterality Date  . GALLBLADDER SURGERY  10/24/2007  . polyp removed     rectum- benign    Social History   Tobacco Use  . Smoking status: Former Smoker    Packs/day: 1.00    Years: 20.00    Pack years: 20.00    Types: Cigarettes    Last attempt to quit: 1967    Years since quitting: 53.4  . Smokeless tobacco: Never Used  Substance Use Topics  . Alcohol use: Yes    Alcohol/week: 0.0 standard drinks    Comment: special occasions  . Drug use: No     Medication list has been reviewed and updated.  Current Meds  Medication Sig  . clopidogrel (PLAVIX) 75 MG tablet Take 1 tablet (75 mg total) by mouth daily.  . fexofenadine (ALLEGRA) 180 MG tablet Take 1 tablet (180  mg total) by mouth daily. otc  . losartan-hydrochlorothiazide (HYZAAR) 100-25 MG tablet Take 1 tablet by mouth daily.  Marland Kitchen lovastatin (MEVACOR) 20 MG tablet TAKE 2 TABLETS (40 MG TOTAL) BY MOUTH DAILY.  . metoprolol succinate (TOPROL-XL) 100 MG 24 hr tablet Take 1 tablet (100 mg total) by mouth daily.  Marland Kitchen venlafaxine XR (EFFEXOR-XR) 75 MG 24 hr capsule Take 1 capsule (75 mg total) by mouth daily.    PHQ 2/9 Scores 03/18/2019 07/22/2018 01/24/2018 01/24/2018  PHQ - 2 Score 0 0 0 0  PHQ- 9 Score 0 - 0 -    BP Readings from Last 3 Encounters:  03/18/19 123/81  10/20/18 (!) 160/70  07/22/18 138/70    Physical Exam Constitutional:      General: She is not irritable.    Wt Readings from Last 3 Encounters:  03/18/19 213 lb (96.6 kg)  10/20/18 210 lb (95.3 kg)  07/22/18 221 lb (100.2 kg)    BP 123/81   Pulse 63   Resp 16   Ht 5\' 7"  (1.702 m)   Wt 213 lb (96.6 kg)   SpO2 98%   BMI 33.36 kg/m   Assessment and Plan:  1. Essential (primary) hypertension Chronic.  Controlled.  Continue metoprolol 100 mg once a day and losartan hydrochlorothiazide 1 tablet a day.  Will check renal function panel. - metoprolol succinate (TOPROL-XL) 100 MG 24 hr tablet; Take 1 tablet (100 mg total) by mouth daily.  Dispense: 90 tablet; Refill: 1 - losartan-hydrochlorothiazide (HYZAAR) 100-25 MG tablet; Take 1 tablet by mouth daily.  Dispense: 90 tablet; Refill: 1  2. Cerebral microvascular disease Patient has a  history of peripheral vascular disease in the past.  Will continue Plavix 75 mg once a day will check a CBC to evaluate for any platelet abnormality. - clopidogrel (PLAVIX) 75 MG tablet; Take 1 tablet (75 mg total) by mouth daily.  Dispense: 90 tablet; Refill: 1 - Renal Function Panel  3. Familial multiple lipoprotein-type hyperlipidemia Chronic.  Controlled.  Continue lovastatin 20 mg 2 tablets once a day.  Check lipid panel. - lovastatin (MEVACOR) 20 MG tablet; TAKE 2 TABLETS (40 MG TOTAL) BY  MOUTH DAILY.  Dispense: 180 tablet; Refill: 1 - Lipid panel  4. Recurrent major depressive episodes (HCC) Chronic.  Controlled.  Current PHQ 9 is 0 we will continue venlafaxine 75 mg once a day. - venlafaxine XR (EFFEXOR-XR) 75 MG 24 hr capsule; Take 1 capsule (75 mg total) by mouth daily.  Dispense: 90 capsule; Refill: 1  5. Taking medication for chronic disease Patient is on Plavix and will check CBC to evaluate for thrombocytopenia. - CBC with Differential/Platelet

## 2019-03-19 LAB — RENAL FUNCTION PANEL
Albumin: 3.6 g/dL — ABNORMAL LOW (ref 3.7–4.7)
BUN/Creatinine Ratio: 12 (ref 12–28)
BUN: 12 mg/dL (ref 8–27)
CO2: 26 mmol/L (ref 20–29)
Calcium: 8.3 mg/dL — ABNORMAL LOW (ref 8.7–10.3)
Chloride: 102 mmol/L (ref 96–106)
Creatinine, Ser: 0.98 mg/dL (ref 0.57–1.00)
GFR calc Af Amer: 65 mL/min/{1.73_m2} (ref >59)
GFR calc non Af Amer: 56 mL/min/{1.73_m2} — ABNORMAL LOW (ref >59)
Glucose: 107 mg/dL — ABNORMAL HIGH (ref 65–99)
Phosphorus: 3.4 mg/dL (ref 3.0–4.3)
Potassium: 3.9 mmol/L (ref 3.5–5.2)
Sodium: 143 mmol/L (ref 134–144)

## 2019-03-19 LAB — CBC WITH DIFFERENTIAL/PLATELET
Basophils Absolute: 0.1 10*3/uL (ref 0.0–0.2)
Basos: 1 %
EOS (ABSOLUTE): 0.3 10*3/uL (ref 0.0–0.4)
Eos: 3 %
Hematocrit: 40 % (ref 34.0–46.6)
Hemoglobin: 13.1 g/dL (ref 11.1–15.9)
Immature Grans (Abs): 0 10*3/uL (ref 0.0–0.1)
Immature Granulocytes: 0 %
Lymphocytes Absolute: 1.8 10*3/uL (ref 0.7–3.1)
Lymphs: 17 %
MCH: 30 pg (ref 26.6–33.0)
MCHC: 32.8 g/dL (ref 31.5–35.7)
MCV: 92 fL (ref 79–97)
Monocytes Absolute: 0.5 10*3/uL (ref 0.1–0.9)
Monocytes: 5 %
Neutrophils Absolute: 7.7 10*3/uL — ABNORMAL HIGH (ref 1.4–7.0)
Neutrophils: 74 %
Platelets: 238 10*3/uL (ref 150–450)
RBC: 4.36 x10E6/uL (ref 3.77–5.28)
RDW: 12.4 % (ref 11.7–15.4)
WBC: 10.4 10*3/uL (ref 3.4–10.8)

## 2019-03-19 LAB — LIPID PANEL
Chol/HDL Ratio: 3 ratio (ref 0.0–4.4)
Cholesterol, Total: 157 mg/dL (ref 100–199)
HDL: 53 mg/dL (ref >39)
LDL Calculated: 91 mg/dL (ref 0–99)
Triglycerides: 63 mg/dL (ref 0–149)
VLDL Cholesterol Cal: 13 mg/dL (ref 5–40)

## 2019-04-14 ENCOUNTER — Ambulatory Visit: Payer: Medicare HMO

## 2019-06-27 ENCOUNTER — Other Ambulatory Visit: Payer: Self-pay

## 2019-06-27 DIAGNOSIS — I6789 Other cerebrovascular disease: Secondary | ICD-10-CM

## 2019-06-27 DIAGNOSIS — F339 Major depressive disorder, recurrent, unspecified: Secondary | ICD-10-CM

## 2019-06-27 MED ORDER — CLOPIDOGREL BISULFATE 75 MG PO TABS: 75.0000 mg | ORAL_TABLET | Freq: Every day | ORAL | 0 refills | Status: DC

## 2019-06-27 MED ORDER — VENLAFAXINE HCL ER 75 MG PO CP24: 75.0000 mg | ORAL_CAPSULE | Freq: Every day | ORAL | 0 refills | Status: DC

## 2019-07-23 ENCOUNTER — Other Ambulatory Visit: Payer: Self-pay

## 2019-07-23 ENCOUNTER — Encounter: Payer: Self-pay | Admitting: Family Medicine

## 2019-07-23 ENCOUNTER — Ambulatory Visit (INDEPENDENT_AMBULATORY_CARE_PROVIDER_SITE_OTHER): Payer: Medicare HMO | Admitting: Family Medicine

## 2019-07-23 VITALS — BP 130/78 | HR 98 | Ht 67.0 in | Wt 208.0 lb

## 2019-07-23 DIAGNOSIS — F339 Major depressive disorder, recurrent, unspecified: Secondary | ICD-10-CM

## 2019-07-23 DIAGNOSIS — I10 Essential (primary) hypertension: Secondary | ICD-10-CM

## 2019-07-23 DIAGNOSIS — E7849 Other hyperlipidemia: Secondary | ICD-10-CM | POA: Diagnosis not present

## 2019-07-23 DIAGNOSIS — R69 Illness, unspecified: Secondary | ICD-10-CM

## 2019-07-23 DIAGNOSIS — Z23 Encounter for immunization: Secondary | ICD-10-CM | POA: Diagnosis not present

## 2019-07-23 DIAGNOSIS — N3090 Cystitis, unspecified without hematuria: Secondary | ICD-10-CM | POA: Diagnosis not present

## 2019-07-23 DIAGNOSIS — I6789 Other cerebrovascular disease: Secondary | ICD-10-CM

## 2019-07-23 DIAGNOSIS — I679 Cerebrovascular disease, unspecified: Secondary | ICD-10-CM | POA: Diagnosis not present

## 2019-07-23 LAB — POCT URINALYSIS DIPSTICK
Bilirubin, UA: NEGATIVE
Glucose, UA: NEGATIVE
Ketones, UA: NEGATIVE
Nitrite, UA: POSITIVE
Protein, UA: POSITIVE — AB
Spec Grav, UA: 1.025 (ref 1.010–1.025)
Urobilinogen, UA: 0.2 E.U./dL
pH, UA: 6.5 (ref 5.0–8.0)

## 2019-07-23 MED ORDER — LOSARTAN POTASSIUM-HCTZ 100-25 MG PO TABS: 1.0000 | ORAL_TABLET | Freq: Every day | ORAL | 1 refills | Status: DC

## 2019-07-23 MED ORDER — METOPROLOL SUCCINATE ER 100 MG PO TB24: 100.0000 mg | ORAL_TABLET | Freq: Every day | ORAL | 1 refills | Status: DC

## 2019-07-23 MED ORDER — LOVASTATIN 20 MG PO TABS: ORAL_TABLET | ORAL | 1 refills | Status: DC

## 2019-07-23 MED ORDER — VENLAFAXINE HCL ER 75 MG PO CP24: 75.0000 mg | ORAL_CAPSULE | Freq: Every day | ORAL | 1 refills | Status: DC

## 2019-07-23 MED ORDER — CLOPIDOGREL BISULFATE 75 MG PO TABS: 75.0000 mg | ORAL_TABLET | Freq: Every day | ORAL | 1 refills | Status: DC

## 2019-07-23 MED ORDER — NITROFURANTOIN MONOHYD MACRO 100 MG PO CAPS: 100.0000 mg | ORAL_CAPSULE | Freq: Two times a day (BID) | ORAL | 0 refills | Status: DC

## 2019-07-23 NOTE — Progress Notes (Signed)
Date:  07/23/2019   Name:  Karina Leonard   DOB:  05-Jun-1941   MRN:  [id number]   Chief Complaint: Hypertension, Hyperlipidemia, cerebrovascular disease (takes plavix), influenza vacc need, and Urinary Tract Infection (has an odor and stings at end of stream)  Hypertension This is a chronic problem. The current episode started more than 1 year ago. The problem has been gradually worsening since onset. The problem is controlled. Pertinent negatives include no anxiety, blurred vision, chest pain, headaches, malaise/fatigue, neck pain, orthopnea, palpitations, peripheral edema, PND, shortness of breath or sweats. There are no associated agents to hypertension. There are no known risk factors for coronary artery disease. Past treatments include diuretics, beta blockers and angiotensin blockers. The current treatment provides moderate improvement. There are no compliance problems.  There is no history of angina, kidney disease, CAD/MI, CVA, heart failure, left ventricular hypertrophy, PVD or retinopathy. There is no history of chronic renal disease, a hypertension causing med or renovascular disease.  Hyperlipidemia This is a chronic problem. The problem is controlled. Recent lipid tests were reviewed and are normal. Exacerbating diseases include obesity. She has no history of chronic renal disease. Factors aggravating her hyperlipidemia include thiazides. Pertinent negatives include no chest pain, myalgias or shortness of breath. The current treatment provides moderate improvement of lipids. There are no compliance problems.   Urinary Tract Infection  This is a new problem. The current episode started in the past 7 days. The problem occurs intermittently. The problem has been gradually worsening. The quality of the pain is described as burning. The pain is mild. There has been no fever. Associated symptoms include frequency. Pertinent negatives include no chills, discharge, flank pain, hematuria,  hesitancy, nausea, sweats, urgency or vomiting. The treatment provided moderate relief.  Diabetes She presents for her follow-up diabetic visit. She has type 2 diabetes mellitus. Her disease course has been stable. Pertinent negatives for hypoglycemia include no dizziness, headaches, nervousness/anxiousness or sweats. Pertinent negatives for diabetes include no blurred vision, no chest pain, no fatigue, no foot paresthesias, no foot ulcerations, no polydipsia, no polyphagia, no polyuria, no visual change, no weakness and no weight loss. There are no hypoglycemic complications. Symptoms are stable. Pertinent negatives for diabetic complications include no CVA, PVD or retinopathy. Risk factors for coronary artery disease include dyslipidemia (prediabetes). Current diabetic treatment includes diet.    Review of Systems  Constitutional: Negative for chills, fatigue, fever, malaise/fatigue and weight loss.  HENT: Negative for drooling, ear discharge, ear pain and sore throat.   Eyes: Negative for blurred vision.  Respiratory: Negative for cough, shortness of breath and wheezing.   Cardiovascular: Negative for chest pain, palpitations, orthopnea, leg swelling and PND.  Gastrointestinal: Negative for abdominal pain, blood in stool, constipation, diarrhea, nausea and vomiting.  Endocrine: Negative for polydipsia, polyphagia and polyuria.  Genitourinary: Positive for frequency. Negative for dysuria, flank pain, hematuria, hesitancy and urgency.  Musculoskeletal: Negative for back pain, myalgias and neck pain.  Skin: Negative for rash.  Allergic/Immunologic: Negative for environmental allergies.  Neurological: Negative for dizziness, weakness and headaches.  Hematological: Does not bruise/bleed easily.  Psychiatric/Behavioral: Negative for suicidal ideas. The patient is not nervous/anxious.     Patient Active Problem List   Diagnosis Date Noted  . Need for vaccination 03/18/2019  . H/O  hypercholesterolemia 03/18/2019  . Drug-induced obesity 03/18/2019  . Coronary artery disease 03/18/2019  . Cataract cortical, senile 03/18/2019  . Taking medication for chronic disease 03/18/2019  . Numbness and tingling of  right upper extremity 08/15/2016  . Lipoma of right upper extremity 08/15/2016  . Cerebral microvascular disease 07/23/2015  . Familial multiple lipoprotein-type hyperlipidemia 02/16/2015  . Anxiety attack 02/16/2015  . Recurrent major depressive episodes ([place]) 02/16/2015  . Essential (primary) hypertension 02/16/2015  . H/O: HTN (hypertension) 02/16/2015  . Asymptomatic hypertensive urgency 11/14/2014  . Tubulovillous adenoma of rectum 10/21/2014  . Depression 10/21/2014  . Nuclear sclerosis of both eyes 07/07/2014    Allergies  Allergen Reactions  . Amoxicillin Hives  . Penicillins     Past Surgical History:  Procedure Laterality Date  . GALLBLADDER SURGERY  10/24/2007  . polyp removed     rectum- benign    Social History   Tobacco Use  . Smoking status: Former Smoker    Packs/day: 1.00    Years: 20.00    Pack years: 20.00    Types: Cigarettes    Quit date: 4    Years since quitting: 53.7  . Smokeless tobacco: Never Used  Substance Use Topics  . Alcohol use: Yes    Alcohol/week: 0.0 standard drinks    Comment: special occasions  . Drug use: No     Medication list has been reviewed and updated.  Current Meds  Medication Sig  . clopidogrel (PLAVIX) 75 MG tablet Take 1 tablet (75 mg total) by mouth daily.  . fexofenadine (ALLEGRA) 180 MG tablet Take 1 tablet (180 mg total) by mouth daily. otc  . losartan-hydrochlorothiazide (HYZAAR) 100-25 MG tablet Take 1 tablet by mouth daily.  Marland Kitchen lovastatin (MEVACOR) 20 MG tablet TAKE 2 TABLETS (40 MG TOTAL) BY MOUTH DAILY.  . metoprolol succinate (TOPROL-XL) 100 MG 24 hr tablet Take 1 tablet (100 mg total) by mouth daily.  Marland Kitchen venlafaxine XR (EFFEXOR-XR) 75 MG 24 hr capsule Take 1 capsule (75 mg total)  by mouth daily.    PHQ 2/9 Scores 03/18/2019 07/22/2018 01/24/2018 01/24/2018  PHQ - 2 Score 0 0 0 0  PHQ- 9 Score 0 - 0 -    BP Readings from Last 3 Encounters:  07/23/19 130/78  03/18/19 123/81  10/20/18 (!) 160/70    Physical Exam Vitals signs and nursing note reviewed.  Constitutional:      Appearance: She is well-developed.  HENT:     Head: Normocephalic.     Right Ear: Tympanic membrane, ear canal and external ear normal.     Left Ear: Tympanic membrane, ear canal and external ear normal.     Nose: Nose normal.     Mouth/Throat:     Mouth: Mucous membranes are moist.  Eyes:     General: Lids are everted, no foreign bodies appreciated. No scleral icterus.       Left eye: No foreign body or hordeolum.     Conjunctiva/sclera: Conjunctivae normal.     Right eye: Right conjunctiva is not injected.     Left eye: Left conjunctiva is not injected.     Pupils: Pupils are equal, round, and reactive to light.  Neck:     Musculoskeletal: Normal range of motion and neck supple.     Thyroid: No thyromegaly.     Vascular: No JVD.     Trachea: No tracheal deviation.  Cardiovascular:     Rate and Rhythm: Normal rate and regular rhythm.     Heart sounds: Normal heart sounds. No murmur. No friction rub. No gallop.   Pulmonary:     Effort: Pulmonary effort is normal. No respiratory distress.     Breath sounds:  Normal breath sounds. No wheezing or rales.  Abdominal:     General: Bowel sounds are normal.     Palpations: Abdomen is soft. There is no mass.     Tenderness: There is no abdominal tenderness. There is no right CVA tenderness, left CVA tenderness, guarding or rebound.  Musculoskeletal: Normal range of motion.        General: No tenderness.  Lymphadenopathy:     Cervical: No cervical adenopathy.  Skin:    General: Skin is warm.     Findings: No rash.  Neurological:     Mental Status: She is alert and oriented to person, place, and time.     Cranial Nerves: No cranial nerve  deficit.     Deep Tendon Reflexes: Reflexes normal.  Psychiatric:        Mood and Affect: Mood is not anxious or depressed.     Wt Readings from Last 3 Encounters:  07/23/19 208 lb (94.3 kg)  03/18/19 213 lb (96.6 kg)  10/20/18 210 lb (95.3 kg)    BP 130/78   Pulse 98   Ht 5\' 7"  (1.702 m)   Wt 208 lb (94.3 kg)   BMI 32.58 kg/m   Assessment and Plan:  1. Cerebral microvascular disease She has a history of previous CVA.  Patient has been stable with no subjective objective concerns noted during history or physical exam.  Will continue Plavix 75 mg once a day and antiplatelet therapy. - clopidogrel (PLAVIX) 75 MG tablet; Take 1 tablet (75 mg total) by mouth daily.  Dispense: 90 tablet; Refill: 1  2. Essential (primary) hypertension Chronic.  Controlled.  Continue losartan hydrochlorothiazide 100-25 mg once a day and metoprolol XL 100 mg once a day.  Will check renal function panel. - losartan-hydrochlorothiazide (HYZAAR) 100-25 MG tablet; Take 1 tablet by mouth daily.  Dispense: 90 tablet; Refill: 1 - metoprolol succinate (TOPROL-XL) 100 MG 24 hr tablet; Take 1 tablet (100 mg total) by mouth daily.  Dispense: 90 tablet; Refill: 1 - Renal Function Panel  3. Familial multiple lipoprotein-type hyperlipidemia Chronic.  Controlled.  Continue lovastatin 20 mg patient takes 2 tablets once a day. - lovastatin (MEVACOR) 20 MG tablet; TAKE 2 TABLETS (40 MG TOTAL) BY MOUTH DAILY.  Dispense: 180 tablet; Refill: 1  4. Recurrent major depressive episodes (HCC) Chronic.  Controlled.  Continue venlafaxine 75 mg once a day.  PHQ was noted to be 0 as was the gad score was noted to be 0. - venlafaxine XR (EFFEXOR-XR) 75 MG 24 hr capsule; Take 1 capsule (75 mg total) by mouth daily.  Dispense: 90 capsule; Refill: 1  5. Taking medication for chronic disease Patient is on a statin for control and will check hepatic panel to rule out any hepatotoxicity. - Hepatic function panel  6. Cystitis This  is new onset.  Will initiate nitrofurantoin 100 mg 1 tablet twice a day for 3 days.  Urinary tract infection was confirmed with point-of-care urinalysis. - nitrofurantoin, macrocrystal-monohydrate, (MACROBID) 100 MG capsule; Take 1 capsule (100 mg total) by mouth 2 (two) times daily.  Dispense: 6 capsule; Refill: 0 - POCT urinalysis dipstick  7. Influenza vaccine needed Discussed and administered. - Flu Vaccine QUAD High Dose(Fluad)

## 2019-07-28 DIAGNOSIS — I10 Essential (primary) hypertension: Secondary | ICD-10-CM | POA: Diagnosis not present

## 2019-07-28 DIAGNOSIS — R69 Illness, unspecified: Secondary | ICD-10-CM | POA: Diagnosis not present

## 2019-07-29 LAB — RENAL FUNCTION PANEL
Albumin: 3.7 g/dL (ref 3.7–4.7)
BUN/Creatinine Ratio: 12 (ref 12–28)
BUN: 13 mg/dL (ref 8–27)
CO2: 27 mmol/L (ref 20–29)
Calcium: 9.1 mg/dL (ref 8.7–10.3)
Chloride: 106 mmol/L (ref 96–106)
Creatinine, Ser: 1.05 mg/dL — ABNORMAL HIGH (ref 0.57–1.00)
GFR calc Af Amer: 60 mL/min/{1.73_m2} (ref >59)
GFR calc non Af Amer: 52 mL/min/{1.73_m2} — ABNORMAL LOW (ref >59)
Glucose: 93 mg/dL (ref 65–99)
Phosphorus: 3.4 mg/dL (ref 3.0–4.3)
Potassium: 4.2 mmol/L (ref 3.5–5.2)
Sodium: 144 mmol/L (ref 134–144)

## 2019-07-29 LAB — HEPATIC FUNCTION PANEL
ALT: 6 IU/L (ref 0–32)
AST: 18 IU/L (ref 0–40)
Alkaline Phosphatase: 100 IU/L (ref 39–117)
Bilirubin Total: 0.4 mg/dL (ref 0.0–1.2)
Bilirubin, Direct: 0.12 mg/dL (ref 0.00–0.40)
Total Protein: 6.2 g/dL (ref 6.0–8.5)

## 2020-01-20 ENCOUNTER — Ambulatory Visit: Payer: Medicare HMO | Admitting: Family Medicine

## 2020-01-21 ENCOUNTER — Ambulatory Visit (INDEPENDENT_AMBULATORY_CARE_PROVIDER_SITE_OTHER): Payer: Medicare HMO | Admitting: Family Medicine

## 2020-01-21 ENCOUNTER — Other Ambulatory Visit: Payer: Self-pay

## 2020-01-21 ENCOUNTER — Encounter: Payer: Self-pay | Admitting: Family Medicine

## 2020-01-21 VITALS — BP 130/80 | HR 60 | Ht 67.0 in | Wt 200.0 lb

## 2020-01-21 DIAGNOSIS — R300 Dysuria: Secondary | ICD-10-CM | POA: Diagnosis not present

## 2020-01-21 DIAGNOSIS — F339 Major depressive disorder, recurrent, unspecified: Secondary | ICD-10-CM | POA: Diagnosis not present

## 2020-01-21 DIAGNOSIS — I6789 Other cerebrovascular disease: Secondary | ICD-10-CM

## 2020-01-21 DIAGNOSIS — I10 Essential (primary) hypertension: Secondary | ICD-10-CM

## 2020-01-21 DIAGNOSIS — E7849 Other hyperlipidemia: Secondary | ICD-10-CM

## 2020-01-21 DIAGNOSIS — R69 Illness, unspecified: Secondary | ICD-10-CM | POA: Diagnosis not present

## 2020-01-21 DIAGNOSIS — R269 Unspecified abnormalities of gait and mobility: Secondary | ICD-10-CM

## 2020-01-21 DIAGNOSIS — R159 Full incontinence of feces: Secondary | ICD-10-CM

## 2020-01-21 LAB — POCT URINALYSIS DIPSTICK
Bilirubin, UA: NEGATIVE
Glucose, UA: NEGATIVE
Ketones, UA: NEGATIVE
Nitrite, UA: NEGATIVE
Protein, UA: NEGATIVE
Spec Grav, UA: 1.02 (ref 1.010–1.025)
Urobilinogen, UA: 0.2 E.U./dL
pH, UA: 6 (ref 5.0–8.0)

## 2020-01-21 MED ORDER — LOSARTAN POTASSIUM-HCTZ 100-25 MG PO TABS: 1.0000 | ORAL_TABLET | Freq: Every day | ORAL | 1 refills | Status: DC

## 2020-01-21 MED ORDER — CIPROFLOXACIN HCL 250 MG PO TABS: 250.0000 mg | ORAL_TABLET | Freq: Every day | ORAL | 0 refills | Status: DC

## 2020-01-21 MED ORDER — METOPROLOL SUCCINATE ER 100 MG PO TB24: 100.0000 mg | ORAL_TABLET | Freq: Every day | ORAL | 1 refills | Status: DC

## 2020-01-21 MED ORDER — LOVASTATIN 20 MG PO TABS: ORAL_TABLET | ORAL | 1 refills | Status: DC

## 2020-01-21 MED ORDER — VENLAFAXINE HCL ER 75 MG PO CP24: 75.0000 mg | ORAL_CAPSULE | Freq: Every day | ORAL | 1 refills | Status: DC

## 2020-01-21 MED ORDER — CLOPIDOGREL BISULFATE 75 MG PO TABS: 75.0000 mg | ORAL_TABLET | Freq: Every day | ORAL | 1 refills | Status: DC

## 2020-01-21 NOTE — Progress Notes (Signed)
Date:  01/21/2020   Name:  Karina Leonard   DOB:  1941/01/19   MRN:  [id number]   Chief Complaint: Hypertension, Hyperlipidemia, Depression, and Cerebrovascular Accident (takes plavix)  Hypertension This is a chronic problem. The current episode started more than 1 year ago. The problem has been gradually improving since onset. The problem is controlled. Associated symptoms include blurred vision and shortness of breath. Pertinent negatives include no anxiety, chest pain, headaches, malaise/fatigue, neck pain, orthopnea, palpitations, peripheral edema, PND or sweats. Past treatments include angiotensin blockers, diuretics and beta blockers. The current treatment provides moderate improvement. There are no compliance problems.  There is no history of angina, kidney disease, CAD/MI, CVA, heart failure, left ventricular hypertrophy, PVD or retinopathy. There is no history of chronic renal disease, a hypertension causing med or renovascular disease.  Hyperlipidemia This is a chronic problem. The current episode started more than 1 year ago. The problem is controlled. Recent lipid tests were reviewed and are normal. Exacerbating diseases include obesity. She has no history of chronic renal disease, diabetes, hypothyroidism, liver disease or nephrotic syndrome. Factors aggravating her hyperlipidemia include beta blockers. Associated symptoms include shortness of breath. Pertinent negatives include no chest pain, focal sensory loss, focal weakness or myalgias. Current antihyperlipidemic treatment includes statins.  Depression      (Shuffle gate/microvascular disease)  This is a chronic problem.  The current episode started more than 1 year ago.   The problem occurs intermittently.The problem is unchanged (relatively stable).  Associated symptoms include decreased concentration.  Associated symptoms include no fatigue, no helplessness, no hopelessness, does not have insomnia, not irritable, no restlessness,  no decreased interest, no appetite change, no body aches, no myalgias, no headaches, no indigestion, not sad and no suicidal ideas.  Past treatments include SSRIs - Selective serotonin reuptake inhibitors.   Pertinent negatives include no hypothyroidism and no anxiety. Neurologic Problem The patient's pertinent negatives include no altered mental status, clumsiness, focal sensory loss, focal weakness, loss of balance, memory loss, near-syncope, slurred speech, syncope, visual change or weakness. Primary symptoms comment: shuffle gate/microvascular disease. This is a chronic problem. The current episode started more than 1 year ago. The neurological problem developed gradually. The problem is unchanged. There was no focality noted. Associated symptoms include back pain, bladder incontinence and shortness of breath. Pertinent negatives include no abdominal pain, auditory change, aura, bowel incontinence, chest pain, confusion, diaphoresis, dizziness, fatigue, fever, headaches, nausea, neck pain, palpitations, vertigo or vomiting. Treatments tried: plavix. There is no history of liver disease.  GI Problem The primary symptoms include diarrhea. Primary symptoms do not include fever, fatigue, abdominal pain, nausea, vomiting, melena, dysuria, myalgias or rash. Primary symptoms comment: shuffle gate/microvascular disease.  The illness is also significant for back pain. The illness does not include chills or constipation. Associated medical issues do not include liver disease. Associated medical issues comments: fecal incontinence.  Dysuria  This is a new problem. The current episode started 1 to 4 weeks ago. The problem occurs intermittently. The problem has been unchanged. The quality of the pain is described as burning. There has been no fever. Associated symptoms include frequency and urgency. Pertinent negatives include no chills, discharge, flank pain, hematuria, hesitancy, nausea, sweats or vomiting.  Associated symptoms comments: odor. She has tried nothing for the symptoms.    Lab Results  Component Value Date   CREATININE 1.05 (H) 07/28/2019   BUN 13 07/28/2019   NA 144 07/28/2019   K 4.2 07/28/2019  CL 106 07/28/2019   CO2 27 07/28/2019   Lab Results  Component Value Date   CHOL 157 03/18/2019   HDL 53 03/18/2019   LDLCALC 91 03/18/2019   TRIG 63 03/18/2019   CHOLHDL 3.0 03/18/2019   No results found for: TSH No results found for: HGBA1C Lab Results  Component Value Date   WBC 10.4 03/18/2019   HGB 13.1 03/18/2019   HCT 40.0 03/18/2019   MCV 92 03/18/2019   PLT 238 03/18/2019   Lab Results  Component Value Date   ALT 6 07/28/2019   AST 18 07/28/2019   ALKPHOS 100 07/28/2019   BILITOT 0.4 07/28/2019     Review of Systems  Constitutional: Negative for appetite change, chills, diaphoresis, fatigue, fever and malaise/fatigue.  HENT: Negative for drooling, ear discharge, ear pain and sore throat.   Eyes: Positive for blurred vision.  Respiratory: Positive for shortness of breath. Negative for cough and wheezing.   Cardiovascular: Negative for chest pain, palpitations, orthopnea, leg swelling, PND and near-syncope.  Gastrointestinal: Positive for diarrhea. Negative for abdominal pain, blood in stool, bowel incontinence, constipation, melena, nausea and vomiting.  Endocrine: Negative for polydipsia.  Genitourinary: Positive for bladder incontinence, frequency and urgency. Negative for dysuria, flank pain, hematuria and hesitancy.  Musculoskeletal: Positive for back pain and gait problem. Negative for myalgias and neck pain.  Skin: Negative for rash.  Allergic/Immunologic: Negative for environmental allergies.  Neurological: Negative for dizziness, vertigo, focal weakness, syncope, weakness, headaches and loss of balance.  Hematological: Does not bruise/bleed easily.  Psychiatric/Behavioral: Positive for decreased concentration and depression. Negative for  confusion, memory loss and suicidal ideas. The patient is not nervous/anxious and does not have insomnia.     Patient Active Problem List   Diagnosis Date Noted  . Need for vaccination 03/18/2019  . H/O hypercholesterolemia 03/18/2019  . Drug-induced obesity 03/18/2019  . Coronary artery disease 03/18/2019  . Cataract cortical, senile 03/18/2019  . Taking medication for chronic disease 03/18/2019  . Numbness and tingling of right upper extremity 08/15/2016  . Lipoma of right upper extremity 08/15/2016  . Cerebral microvascular disease 07/23/2015  . Familial multiple lipoprotein-type hyperlipidemia 02/16/2015  . Anxiety attack 02/16/2015  . Recurrent major depressive episodes ([place]) 02/16/2015  . Essential (primary) hypertension 02/16/2015  . H/O: HTN (hypertension) 02/16/2015  . Asymptomatic hypertensive urgency 11/14/2014  . Tubulovillous adenoma of rectum 10/21/2014  . Depression 10/21/2014  . Nuclear sclerosis of both eyes 07/07/2014    Allergies  Allergen Reactions  . Amoxicillin Hives  . Penicillins     Past Surgical History:  Procedure Laterality Date  . GALLBLADDER SURGERY  10/24/2007  . polyp removed     rectum- benign    Social History   Tobacco Use  . Smoking status: Former Smoker    Packs/day: 1.00    Years: 20.00    Pack years: 20.00    Types: Cigarettes    Quit date: 41    Years since quitting: 54.2  . Smokeless tobacco: Never Used  Substance Use Topics  . Alcohol use: Yes    Alcohol/week: 0.0 standard drinks    Comment: special occasions  . Drug use: No     Medication list has been reviewed and updated.  Current Meds  Medication Sig  . clopidogrel (PLAVIX) 75 MG tablet Take 1 tablet (75 mg total) by mouth daily.  . fexofenadine (ALLEGRA) 180 MG tablet Take 1 tablet (180 mg total) by mouth daily. otc  . losartan-hydrochlorothiazide (HYZAAR) 100-25 MG  tablet Take 1 tablet by mouth daily.  Marland Kitchen lovastatin (MEVACOR) 20 MG tablet TAKE 2 TABLETS (40  MG TOTAL) BY MOUTH DAILY.  . metoprolol succinate (TOPROL-XL) 100 MG 24 hr tablet Take 1 tablet (100 mg total) by mouth daily.  Marland Kitchen venlafaxine XR (EFFEXOR-XR) 75 MG 24 hr capsule Take 1 capsule (75 mg total) by mouth daily.    PHQ 2/9 Scores 01/21/2020 03/18/2019 07/22/2018 01/24/2018  PHQ - 2 Score 0 0 0 0  PHQ- 9 Score 1 0 - 0    BP Readings from Last 3 Encounters:  01/21/20 130/80  07/23/19 130/78  03/18/19 123/81    Physical Exam Constitutional:      General: She is not irritable.    Wt Readings from Last 3 Encounters:  01/21/20 200 lb (90.7 kg)  07/23/19 208 lb (94.3 kg)  03/18/19 213 lb (96.6 kg)    BP 130/80   Pulse 60   Ht 5\' 7"  (1.702 m)   Wt 200 lb (90.7 kg)   BMI 31.32 kg/m   Assessment and Plan:  1. Essential (primary) hypertension Chronic.  Controlled.  Stable.  Continue losartan hydrochlorothiazide 100-25 mg once a day as well as metoprolol XL 100 mg once a day.  Will check renal function panel to evaluate for GFR concerns. - losartan-hydrochlorothiazide (HYZAAR) 100-25 MG tablet; Take 1 tablet by mouth daily.  Dispense: 90 tablet; Refill: 1 - metoprolol succinate (TOPROL-XL) 100 MG 24 hr tablet; Take 1 tablet (100 mg total) by mouth daily.  Dispense: 90 tablet; Refill: 1 - Renal Function Panel  2. Cerebral microvascular disease Chronic.  Controlled.  Stable.  Patient has a remote history of a cerebrovascular accident with no significant residual concern.  Patient will continue Plavix 75 mg once a day.  And because of new onset of gait disturbance and mild cognitive decline patient will be referred to neurology for evaluation. - clopidogrel (PLAVIX) 75 MG tablet; Take 1 tablet (75 mg total) by mouth daily.  Dispense: 90 tablet; Refill: 1 - Ambulatory referral to Neurology  3. Familial multiple lipoprotein-type hyperlipidemia Chronic.  Controlled.  Stable.  Continue lovastatin 20 mg 2 tablets daily.  Will check lipid panel for evaluation of lipid management. -  lovastatin (MEVACOR) 20 MG tablet; TAKE 2 TABLETS (40 MG TOTAL) BY MOUTH DAILY.  Dispense: 180 tablet; Refill: 1 - Lipid Panel With LDL/HDL Ratio  4. Recurrent major depressive episodes (HCC) Chronic.  Controlled.  Stable.  PHQ is one with a gad score of two.  Continuance of venlafaxine XR 75 mg once a day. - venlafaxine XR (EFFEXOR-XR) 75 MG 24 hr capsule; Take 1 capsule (75 mg total) by mouth daily.  Dispense: 90 capsule; Refill: 1  5. Incontinence of feces, unspecified fecal incontinence type Patient is noted new onset of fecal incontinence with occasional diarrhea.  Patient does have formed bowel movements.  There is been no falls or recent trauma.  We will honor patient's request to see Dr. Verl Blalock who is seen her in the past for evaluation. - Ambulatory referral to Gastroenterology  6. Abnormal gait Patient has noted to have abnormal gait with more of a shuffling.  This could put her at risk for falls and we will refer to neurology for evaluation of possible early parkinsonism or other neurocognitive concern. - Ambulatory referral to Neurology  7. Dysuria New onset persistent.  Patient has had dysuria with frequency and urgency and urine odor for several days.  Check of urinalysis dipstick notes leukocytes consistent with  cystitis.  We will initiate Cipro 250 mg once a day. - POCT urinalysis dipstick - ciprofloxacin (CIPRO) 250 MG tablet; Take 1 tablet (250 mg total) by mouth daily with breakfast.  Dispense: 3 tablet; Refill: 0  8. Taking medication for chronic disease Patient is on Plavix and we will check CBC to evaluate for thrombocytopenia. - CBC w/Diff/Platelet

## 2020-01-22 DIAGNOSIS — E7849 Other hyperlipidemia: Secondary | ICD-10-CM | POA: Diagnosis not present

## 2020-01-22 DIAGNOSIS — I10 Essential (primary) hypertension: Secondary | ICD-10-CM | POA: Diagnosis not present

## 2020-01-22 DIAGNOSIS — R69 Illness, unspecified: Secondary | ICD-10-CM | POA: Diagnosis not present

## 2020-01-23 LAB — CBC WITH DIFFERENTIAL/PLATELET
Basophils Absolute: 0.1 10*3/uL (ref 0.0–0.2)
Basos: 1 %
EOS (ABSOLUTE): 0.2 10*3/uL (ref 0.0–0.4)
Eos: 2 %
Hematocrit: 41.1 % (ref 34.0–46.6)
Hemoglobin: 14.2 g/dL (ref 11.1–15.9)
Immature Grans (Abs): 0 10*3/uL (ref 0.0–0.1)
Immature Granulocytes: 0 %
Lymphocytes Absolute: 1.8 10*3/uL (ref 0.7–3.1)
Lymphs: 17 %
MCH: 31.8 pg (ref 26.6–33.0)
MCHC: 34.5 g/dL (ref 31.5–35.7)
MCV: 92 fL (ref 79–97)
Monocytes Absolute: 0.5 10*3/uL (ref 0.1–0.9)
Monocytes: 5 %
Neutrophils Absolute: 8.3 10*3/uL — ABNORMAL HIGH (ref 1.4–7.0)
Neutrophils: 75 %
Platelets: 243 10*3/uL (ref 150–450)
RBC: 4.47 x10E6/uL (ref 3.77–5.28)
RDW: 12.5 % (ref 11.7–15.4)
WBC: 10.8 10*3/uL (ref 3.4–10.8)

## 2020-01-23 LAB — LIPID PANEL WITH LDL/HDL RATIO
Cholesterol, Total: 172 mg/dL (ref 100–199)
HDL: 60 mg/dL (ref >39)
LDL Chol Calc (NIH): 101 mg/dL — ABNORMAL HIGH (ref 0–99)
LDL/HDL Ratio: 1.7 ratio (ref 0.0–3.2)
Triglycerides: 56 mg/dL (ref 0–149)
VLDL Cholesterol Cal: 11 mg/dL (ref 5–40)

## 2020-01-23 LAB — RENAL FUNCTION PANEL
Albumin: 3.9 g/dL (ref 3.7–4.7)
BUN/Creatinine Ratio: 16 (ref 12–28)
BUN: 17 mg/dL (ref 8–27)
CO2: 26 mmol/L (ref 20–29)
Calcium: 8.9 mg/dL (ref 8.7–10.3)
Chloride: 103 mmol/L (ref 96–106)
Creatinine, Ser: 1.05 mg/dL — ABNORMAL HIGH (ref 0.57–1.00)
GFR calc Af Amer: 60 mL/min/{1.73_m2} (ref >59)
GFR calc non Af Amer: 52 mL/min/{1.73_m2} — ABNORMAL LOW (ref >59)
Glucose: 112 mg/dL — ABNORMAL HIGH (ref 65–99)
Phosphorus: 3.6 mg/dL (ref 3.0–4.3)
Potassium: 4.1 mmol/L (ref 3.5–5.2)
Sodium: 144 mmol/L (ref 134–144)

## 2020-02-04 ENCOUNTER — Ambulatory Visit (INDEPENDENT_AMBULATORY_CARE_PROVIDER_SITE_OTHER): Payer: Medicare HMO

## 2020-02-04 ENCOUNTER — Ambulatory Visit: Payer: Medicare HMO

## 2020-02-04 DIAGNOSIS — Z0000 Encounter for general adult medical examination without abnormal findings: Secondary | ICD-10-CM | POA: Diagnosis not present

## 2020-02-04 NOTE — Progress Notes (Signed)
Subjective:   Karina Leonard is a 79 y.o. female who presents for an Initial Medicare Annual Wellness Visit.  Virtual Visit via Telephone Note  I connected with Karina Leonard on 02/04/20 at  3:20 PM EDT by telephone and verified that I am speaking with the correct person using two identifiers.  Medicare Annual Wellness visit completed telephonically due to Covid-19 pandemic.   Location: Patient: home Provider: office   I discussed the limitations, risks, security and privacy concerns of performing an evaluation and management service by telephone and the availability of in person appointments. The patient expressed understanding and agreed to proceed.  Some vital signs may be absent or patient reported.   Clemetine Marker, LPN    Review of Systems      Cardiac Risk Factors include: advanced age (>50men, >41 women);hypertension;dyslipidemia     Objective:    There were no vitals filed for this visit. There is no height or weight on file to calculate BMI.  Advanced Directives 02/04/2020 10/20/2018 01/24/2018 07/23/2015  Does Patient Have a Medical Advance Directive? No No No No  Would patient like information on creating a medical advance directive? No - Patient declined - Yes (MAU/Ambulatory/Procedural Areas - Information given) No - patient declined information    Current Medications (verified) Outpatient Encounter Medications as of 02/04/2020  Medication Sig  . clopidogrel (PLAVIX) 75 MG tablet Take 1 tablet (75 mg total) by mouth daily.  . fexofenadine (ALLEGRA) 180 MG tablet Take 1 tablet (180 mg total) by mouth daily. otc  . losartan-hydrochlorothiazide (HYZAAR) 100-25 MG tablet Take 1 tablet by mouth daily.  Marland Kitchen lovastatin (MEVACOR) 20 MG tablet TAKE 2 TABLETS (40 MG TOTAL) BY MOUTH DAILY.  . metoprolol succinate (TOPROL-XL) 100 MG 24 hr tablet Take 1 tablet (100 mg total) by mouth daily.  Marland Kitchen venlafaxine XR (EFFEXOR-XR) 75 MG 24 hr capsule Take 1 capsule (75 mg total) by  mouth daily.  . [DISCONTINUED] ciprofloxacin (CIPRO) 250 MG tablet Take 1 tablet (250 mg total) by mouth daily with breakfast.   No facility-administered encounter medications on file as of 02/04/2020.    Allergies (verified) Amoxicillin and Penicillins   History: Past Medical History:  Diagnosis Date  . Allergy   . Cerebrovascular disease   . Depression   . Diabetes mellitus without complication ([place])   . Hyperlipidemia   . Hypertension   . Sciatica   . Stroke [facility])    Past Surgical History:  Procedure Laterality Date  . GALLBLADDER SURGERY  10/24/2007  . polyp removed     rectum- benign   Family History  Problem Relation Age of Onset  . Cancer Mother        uterine or ovarian. Unable to remember  . Cancer Father        lung  . Heart attack Sister    Social History   Socioeconomic History  . Marital status: Married    Spouse name: Not on file  . Number of children: 3  . Years of education: some college  . Highest education level: 12th grade  Occupational History  . Occupation: Retired  Tobacco Use  . Smoking status: Former Smoker    Packs/day: 1.00    Years: 20.00    Pack years: 20.00    Types: Cigarettes    Quit date: 75    Years since quitting: 54.3  . Smokeless tobacco: Never Used  Substance and Sexual Activity  . Alcohol use: Yes    Alcohol/week: 0.0  standard drinks    Comment: special occasions  . Drug use: No  . Sexual activity: Never  Other Topics Concern  . Not on file  Social History Narrative  . Not on file   Social Determinants of Health   Financial Resource Strain: Low Risk   . Difficulty of Paying Living Expenses: Not hard at all  Food Insecurity: No Food Insecurity  . Worried About Charity fundraiser in the Last Year: Never true  . Ran Out of Food in the Last Year: Never true  Transportation Needs: No Transportation Needs  . Lack of Transportation (Medical): No  . Lack of Transportation (Non-Medical): No  Physical Activity:  Sufficiently Active  . Days of Exercise per Week: 3 days  . Minutes of Exercise per Session: 60 min  Stress: No Stress Concern Present  . Feeling of Stress : Only a little  Social Connections: Unknown  . Frequency of Communication with Friends and Family: Patient refused  . Frequency of Social Gatherings with Friends and Family: Patient refused  . Attends Religious Services: Patient refused  . Active Member of Clubs or Organizations: Patient refused  . Attends Archivist Meetings: Patient refused  . Marital Status: Married    Tobacco Counseling Counseling given: Not Answered   Clinical Intake:  Pre-visit preparation completed: Yes  Pain : No/denies pain     Nutritional Risks: None Diabetes: No  How often do you need to have someone help you when you read instructions, pamphlets, or other written materials from your doctor or pharmacy?: 1 - Never  Interpreter Needed?: No  Information entered by :: Clemetine Marker LPN   Activities of Daily Living In your present state of health, do you have any difficulty performing the following activities: 02/04/2020  Hearing? N  Comment declines hearing aids  Vision? N  Difficulty concentrating or making decisions? N  Walking or climbing stairs? N  Dressing or bathing? N  Doing errands, shopping? N  Preparing Food and eating ? N  Using the Toilet? N  In the past six months, have you accidently leaked urine? Y  Do you have problems with loss of bowel control? Y  Managing your Medications? N  Managing your Finances? N  Housekeeping or managing your Housekeeping? N  Some recent data might be hidden     Immunizations and Health Maintenance Immunization History  Administered Date(s) Administered  . Fluad Quad(high Dose 65+) 07/23/2019  . Influenza Split 08/25/2010  . Influenza, High Dose Seasonal PF 07/18/2017, 07/22/2018  . Influenza,inj,Quad PF,6+ Mos 07/04/2013, 07/21/2014, 07/23/2015, 07/20/2016  .  Influenza-Unspecified 07/21/2014  . PFIZER SARS-COV-2 Vaccination 11/13/2019, 12/04/2019  . Pneumococcal Conjugate-13 12/18/2014  . Pneumococcal Polysaccharide-23 01/20/2013  . Tdap 07/23/2015  . Zoster 10/23/2013   Health Maintenance Due  Topic Date Due  . HEMOGLOBIN A1C  Never done  . FOOT EXAM  Never done  . OPHTHALMOLOGY EXAM  Never done    Patient Care Team: Juline Patch, MD as PCP - General (Family Medicine)  Indicate any recent Medical Services you may have received from other than [facility] providers in the past year (date may be approximate).     Assessment:   This is a routine wellness examination for Karina Leonard.  Hearing/Vision screen  Hearing Screening   125Hz  250Hz  500Hz  1000Hz  2000Hz  3000Hz  4000Hz  6000Hz  8000Hz   Right ear:           Left ear:           Comments: Pt denies  hearing difficulty  Vision Screening Comments: Annual vision screenings with Dr. Dustin Flock in [place]  Dietary issues and exercise activities discussed: Current Exercise Habits: Home exercise routine, Type of exercise: walking, Time (Minutes): 60, Frequency (Times/Week): 3, Weekly Exercise (Minutes/Week): 180, Intensity: Mild, Exercise limited by: None identified  Goals    . DIET - INCREASE WATER INTAKE     Recommend to drink at least 6-8 8oz glasses of water per day.    . Weight (lb) < 200 lb (90.7 kg) (pt-stated)     Pt would like to lose at least 15 lbs over the next year.       Depression Screen PHQ 2/9 Scores 02/04/2020 01/21/2020 03/18/2019 07/22/2018 01/24/2018 01/24/2018 07/18/2017  PHQ - 2 Score 0 0 0 0 0 0 0  PHQ- 9 Score - 1 0 - 0 - 0    Fall Risk Fall Risk  02/04/2020 01/21/2020 03/18/2019 07/22/2018 01/24/2018  Falls in the past year? 0 0 1 No Yes  Comment - - - - states she slipped going down dtrs stairs  Number falls in past yr: 0 - 1 - 1  Injury with Fall? 0 - 1 - No  Risk for fall due to : No Fall Risks - Other (Comment) - Impaired vision  Risk for fall due to: Comment - - shoe  caused fall a few weeks ago - -  Follow up Falls prevention discussed Falls evaluation completed - - Falls evaluation completed;Education provided;Falls prevention discussed   FALL RISK PREVENTION PERTAINING TO THE HOME:  Any stairs in or around the home? No  If so, do they handrails? No   Home free of loose throw rugs in walkways, pet beds, electrical cords, etc? Yes  Adequate lighting in your home to reduce risk of falls? Yes   ASSISTIVE DEVICES UTILIZED TO PREVENT FALLS:  Life alert? No  Use of a cane, walker or w/c? No  Grab bars in the bathroom? Yes  Shower chair or bench in shower? Yes  Elevated toilet seat or a handicapped toilet? Yes   DME ORDERS:  DME order needed?  No   TIMED UP AND GO:  Was the test performed? No . Telephonic visit.   Education: Fall risk prevention has been discussed.  Intervention(s) required? No    Cognitive Function: Pt declined 6CIT for 2021 AWV.      6CIT Screen 01/24/2018  What Year? 0 points  What month? 0 points  What time? 0 points  Count back from 20 0 points  Months in reverse 2 points  Repeat phrase 2 points  Total Score 4    Screening Tests Health Maintenance  Topic Date Due  . HEMOGLOBIN A1C  Never done  . FOOT EXAM  Never done  . OPHTHALMOLOGY EXAM  Never done  . INFLUENZA VACCINE  05/23/2020  . TETANUS/TDAP  07/22/2025  . DEXA SCAN  Completed  . PNA vac Low Risk Adult  Completed  . MAMMOGRAM  Discontinued    Qualifies for Shingles Vaccine? Yes  Zostavax completed 2015. Due for Shingrix. Education has been provided regarding the importance of this vaccine. Pt has been advised to call insurance company to determine out of pocket expense. Advised may also receive vaccine at local pharmacy or Health Dept. Verbalized acceptance and understanding.  Tdap: Up to date  Flu Vaccine: Up to date  Pneumococcal Vaccine: Up to date    Cancer Screenings:  Colorectal Screening: Completed 2015. Repeat every 10 years;    Mammogram:  No longer required.   Bone Density: Completed 2012. Results reflect NORMAL. Pt requests to discontinue this screening.  Lung Cancer Screening: (Low Dose CT Chest recommended if Age 50-80 years, 30 pack-year currently smoking OR have quit w/in 15years.) does not qualify.    Additional Screening:  Hepatitis C Screening: no longer required  Vision Screening: Recommended annual ophthalmology exams for early detection of glaucoma and other disorders of the eye. Is the patient up to date with their annual eye exam?  Yes  Who is the provider or what is the name of the office in which the pt attends annual eye exams? Dr. Arlis Porta  Dental Screening: Recommended annual dental exams for proper oral hygiene  Community Resource Referral:  CRR required this visit?  No      Plan:    I have personally reviewed and addressed the Medicare Annual Wellness questionnaire and have noted the following in the patient's chart:  A. Medical and social history B. Use of alcohol, tobacco or illicit drugs  C. Current medications and supplements D. Functional ability and status E.  Nutritional status F.  Physical activity G. Advance directives H. List of other physicians I.  Hospitalizations, surgeries, and ER visits in previous 12 months J.  [place] such as hearing and vision if needed, cognitive and depression L. Referrals and appointments   In addition, I have reviewed and discussed with patient certain preventive protocols, quality metrics, and best practice recommendations. A written personalized care plan for preventive services as well as general preventive health recommendations were provided to patient.   Signed,  Clemetine Marker, LPN Nurse Health Advisor   Nurse Notes: none

## 2020-02-04 NOTE — Patient Instructions (Signed)
Karina Leonard , Thank you for taking time to come for your Medicare Wellness Visit. I appreciate your ongoing commitment to your health goals. Please review the following plan we discussed and let me know if I can assist you in the future.   Screening recommendations/referrals: Colonoscopy: done 11/136/15 Mammogram: no longer required Bone Density: no longer required Recommended yearly ophthalmology/optometry visit for glaucoma screening and checkup Recommended yearly dental visit for hygiene and checkup  Vaccinations: Influenza vaccine: done 07/23/19 Pneumococcal vaccine: done 12/18/14 Tdap vaccine: done 07/23/15 Shingles vaccine: Shingrix discussed. Please contact your pharmacy for coverage information.  Covid-19: done 11/13/19 & 12/04/19  Advanced directives: Advance directive discussed with you today. Even though you declined this today please call our office should you change your mind and we can give you the proper paperwork for you to fill out.  Conditions/risks identified: Recommend healthy eating and physical activity for desired weight loss  Next appointment: Please follow up in one year for your Medicare Annual Wellness visit.     Preventive Care 44 Years and Older, Female Preventive care refers to lifestyle choices and visits with your health care provider that can promote health and wellness. What does preventive care include?  A yearly physical exam. This is also called an annual well check.  Dental exams once or twice a year.  Routine eye exams. Ask your health care provider how often you should have your eyes checked.  Personal lifestyle choices, including:  Daily care of your teeth and gums.  Regular physical activity.  Eating a healthy diet.  Avoiding tobacco and drug use.  Limiting alcohol use.  Practicing safe sex.  Taking low-dose aspirin every day.  Taking vitamin and mineral supplements as recommended by your health care provider. What happens during  an annual well check? The services and screenings done by your health care provider during your annual well check will depend on your age, overall health, lifestyle risk factors, and family history of disease. Counseling  Your health care provider may ask you questions about your:  Alcohol use.  Tobacco use.  Drug use.  Emotional well-being.  Home and relationship well-being.  Sexual activity.  Eating habits.  History of falls.  Memory and ability to understand (cognition).  Work and work Statistician.  Reproductive health. Screening  You may have the following tests or measurements:  Height, weight, and BMI.  Blood pressure.  Lipid and cholesterol levels. These may be checked every 5 years, or more frequently if you are over 38 years old.  Skin check.  Lung cancer screening. You may have this screening every year starting at age 53 if you have a 30-pack-year history of smoking and currently smoke or have quit within the past 15 years.  Fecal occult blood test (FOBT) of the stool. You may have this test every year starting at age 71.  Flexible sigmoidoscopy or colonoscopy. You may have a sigmoidoscopy every 5 years or a colonoscopy every 10 years starting at age 49.  Hepatitis C blood test.  Hepatitis B blood test.  Sexually transmitted disease (STD) testing.  Diabetes screening. This is done by checking your blood sugar (glucose) after you have not eaten for a while (fasting). You may have this done every 1-3 years.  Bone density scan. This is done to screen for osteoporosis. You may have this done starting at age 36.  Mammogram. This may be done every 1-2 years. Talk to your health care provider about how often you should have regular mammograms.  Talk with your health care provider about your test results, treatment options, and if necessary, the need for more tests. Vaccines  Your health care provider may recommend certain vaccines, such as:  Influenza  vaccine. This is recommended every year.  Tetanus, diphtheria, and acellular pertussis (Tdap, Td) vaccine. You may need a Td booster every 10 years.  Zoster vaccine. You may need this after age 58.  Pneumococcal 13-valent conjugate (PCV13) vaccine. One dose is recommended after age 2.  Pneumococcal polysaccharide (PPSV23) vaccine. One dose is recommended after age 97. Talk to your health care provider about which screenings and vaccines you need and how often you need them. This information is not intended to replace advice given to you by your health care provider. Make sure you discuss any questions you have with your health care provider. Document Released: 11/05/2015 Document Revised: 06/28/2016 Document Reviewed: 08/10/2015 Elsevier Interactive Patient Education  2017 [employer] Prevention in the Home Falls can cause injuries. They can happen to people of all ages. There are many things you can do to make your home safe and to help prevent falls. What can I do on the outside of my home?  Regularly fix the edges of walkways and driveways and fix any cracks.  Remove anything that might make you trip as you walk through a door, such as a raised step or threshold.  Trim any bushes or trees on the path to your home.  Use bright outdoor lighting.  Clear any walking paths of anything that might make someone trip, such as rocks or tools.  Regularly check to see if handrails are loose or broken. Make sure that both sides of any steps have handrails.  Any raised decks and porches should have guardrails on the edges.  Have any leaves, snow, or ice cleared regularly.  Use sand or salt on walking paths during winter.  Clean up any spills in your garage right away. This includes oil or grease spills. What can I do in the bathroom?  Use night lights.  Install grab bars by the toilet and in the tub and shower. Do not use towel bars as grab bars.  Use non-skid mats or decals  in the tub or shower.  If you need to sit down in the shower, use a plastic, non-slip stool.  Keep the floor dry. Clean up any water that spills on the floor as soon as it happens.  Remove soap buildup in the tub or shower regularly.  Attach bath mats securely with double-sided non-slip rug tape.  Do not have throw rugs and other things on the floor that can make you trip. What can I do in the bedroom?  Use night lights.  Make sure that you have a light by your bed that is easy to reach.  Do not use any sheets or blankets that are too big for your bed. They should not hang down onto the floor.  Have a firm chair that has side arms. You can use this for support while you get dressed.  Do not have throw rugs and other things on the floor that can make you trip. What can I do in the kitchen?  Clean up any spills right away.  Avoid walking on wet floors.  Keep items that you use a lot in easy-to-reach places.  If you need to reach something above you, use a strong step stool that has a grab bar.  Keep electrical cords out of the way.  Do not use floor polish or wax that makes floors slippery. If you must use wax, use non-skid floor wax.  Do not have throw rugs and other things on the floor that can make you trip. What can I do with my stairs?  Do not leave any items on the stairs.  Make sure that there are handrails on both sides of the stairs and use them. Fix handrails that are broken or loose. Make sure that handrails are as long as the stairways.  Check any carpeting to make sure that it is firmly attached to the stairs. Fix any carpet that is loose or worn.  Avoid having throw rugs at the top or bottom of the stairs. If you do have throw rugs, attach them to the floor with carpet tape.  Make sure that you have a light switch at the top of the stairs and the bottom of the stairs. If you do not have them, ask someone to add them for you. What else can I do to help  prevent falls?  Wear shoes that:  Do not have high heels.  Have rubber bottoms.  Are comfortable and fit you well.  Are closed at the toe. Do not wear sandals.  If you use a stepladder:  Make sure that it is fully opened. Do not climb a closed stepladder.  Make sure that both sides of the stepladder are locked into place.  Ask someone to hold it for you, if possible.  Clearly mark and make sure that you can see:  Any grab bars or handrails.  First and last steps.  Where the edge of each step is.  Use tools that help you move around (mobility aids) if they are needed. These include:  Canes.  Walkers.  Scooters.  Crutches.  Turn on the lights when you go into a dark area. Replace any light bulbs as soon as they burn out.  Set up your furniture so you have a clear path. Avoid moving your furniture around.  If any of your floors are uneven, fix them.  If there are any pets around you, be aware of where they are.  Review your medicines with your doctor. Some medicines can make you feel dizzy. This can increase your chance of falling. Ask your doctor what other things that you can do to help prevent falls. This information is not intended to replace advice given to you by your health care provider. Make sure you discuss any questions you have with your health care provider. Document Released: 08/05/2009 Document Revised: 03/16/2016 Document Reviewed: 11/13/2014 Elsevier Interactive Patient Education  2017 [employer].

## 2020-02-24 DIAGNOSIS — E538 Deficiency of other specified B group vitamins: Secondary | ICD-10-CM | POA: Diagnosis not present

## 2020-02-24 DIAGNOSIS — E519 Thiamine deficiency, unspecified: Secondary | ICD-10-CM | POA: Diagnosis not present

## 2020-02-24 DIAGNOSIS — R269 Unspecified abnormalities of gait and mobility: Secondary | ICD-10-CM | POA: Diagnosis not present

## 2020-02-24 DIAGNOSIS — Z8673 Personal history of transient ischemic attack (TIA), and cerebral infarction without residual deficits: Secondary | ICD-10-CM | POA: Diagnosis not present

## 2020-02-24 DIAGNOSIS — I10 Essential (primary) hypertension: Secondary | ICD-10-CM | POA: Diagnosis not present

## 2020-02-24 DIAGNOSIS — R4189 Other symptoms and signs involving cognitive functions and awareness: Secondary | ICD-10-CM | POA: Diagnosis not present

## 2020-02-25 ENCOUNTER — Ambulatory Visit: Payer: Medicare HMO | Admitting: Gastroenterology

## 2020-03-01 ENCOUNTER — Other Ambulatory Visit: Payer: Self-pay | Admitting: Neurology

## 2020-03-01 ENCOUNTER — Other Ambulatory Visit (HOSPITAL_COMMUNITY): Payer: Self-pay | Admitting: Neurology

## 2020-03-01 DIAGNOSIS — R4189 Other symptoms and signs involving cognitive functions and awareness: Secondary | ICD-10-CM

## 2020-03-04 ENCOUNTER — Encounter: Payer: Self-pay | Admitting: Gastroenterology

## 2020-03-04 ENCOUNTER — Ambulatory Visit (INDEPENDENT_AMBULATORY_CARE_PROVIDER_SITE_OTHER): Payer: Medicare HMO | Admitting: Gastroenterology

## 2020-03-04 ENCOUNTER — Other Ambulatory Visit: Payer: Self-pay

## 2020-03-04 VITALS — Temp 97.0°F | Ht 67.0 in | Wt 197.2 lb

## 2020-03-04 DIAGNOSIS — R197 Diarrhea, unspecified: Secondary | ICD-10-CM

## 2020-03-04 MED ORDER — SUTAB 1479-225-188 MG PO TABS: 376.0000 mg | ORAL_TABLET | ORAL | 0 refills | Status: DC

## 2020-03-04 NOTE — Progress Notes (Signed)
Gastroenterology Consultation  Referring Provider:     Juline Patch, MD Primary Care Physician:  Karina Patch, MD Primary Gastroenterologist:  Dr. Allen Leonard     Reason for Consultation:     Fecal incontinence        HPI:   Karina Leonard is a 79 y.o. y/o female referred for consultation & management of fecal incontinence by Dr. Ronnald Leonard, Karina Finn, MD.  This patient was sent by her primary care provider with a history of diarrhea and fecal incontinence.  The patient appears to have seen me back in 2015. The patient had a colonoscopy in the past with a tubulovillous adenoma at [facility].  She also reports that she had a hemorrhoidectomy in the past.  The patient's last colonoscopy appears to have been in January 2016. The patient was followed by a surgeon for her hemorrhoidectomy and previous colonoscopy.  She now states that she is having diarrhea with incontinence.  She does drink milk and partakes in dairy intake.  She also does eat fruits including prunes.  There is no report of any unexplained weight loss fevers chills nausea or vomiting black stools or bloody stools.  Past Medical History:  Diagnosis Date  . Allergy   . Cerebrovascular disease   . Depression   . Diabetes mellitus without complication ([place])   . Hyperlipidemia   . Hypertension   . Sciatica   . Stroke [facility])     Past Surgical History:  Procedure Laterality Date  . GALLBLADDER SURGERY  10/24/2007  . polyp removed     rectum- benign    Prior to Admission medications   Medication Sig Start Date End Date Taking? Authorizing Provider  clopidogrel (PLAVIX) 75 MG tablet Take 1 tablet (75 mg total) by mouth daily. 01/21/20   Karina Patch, MD  fexofenadine (ALLEGRA) 180 MG tablet Take 1 tablet (180 mg total) by mouth daily. otc 07/22/18   Karina Patch, MD  losartan-hydrochlorothiazide (HYZAAR) 100-25 MG tablet Take 1 tablet by mouth daily. 01/21/20   Karina Patch, MD  lovastatin (MEVACOR) 20 MG tablet TAKE 2 TABLETS (40  MG TOTAL) BY MOUTH DAILY. 01/21/20   Karina Patch, MD  metoprolol succinate (TOPROL-XL) 100 MG 24 hr tablet Take 1 tablet (100 mg total) by mouth daily. 01/21/20   Karina Patch, MD  venlafaxine XR (EFFEXOR-XR) 75 MG 24 hr capsule Take 1 capsule (75 mg total) by mouth daily. 01/21/20   Karina Patch, MD    Family History  Problem Relation Age of Onset  . Cancer Mother        uterine or ovarian. Unable to remember  . Cancer Father        lung  . Heart attack Sister      Social History   Tobacco Use  . Smoking status: Former Smoker    Packs/day: 1.00    Years: 20.00    Pack years: 20.00    Types: Cigarettes    Quit date: 12    Years since quitting: 54.4  . Smokeless tobacco: Never Used  Substance Use Topics  . Alcohol use: Yes    Alcohol/week: 0.0 standard drinks    Comment: special occasions  . Drug use: No    Allergies as of 03/04/2020 - Review Complete 02/04/2020  Allergen Reaction Noted  . Amoxicillin Hives 07/23/2015  . Penicillins  02/16/2015    Review of Systems:    All systems reviewed and negative except where noted  in HPI.   Physical Exam:  There were no vitals taken for this visit. No LMP recorded. Patient is postmenopausal. General:   Alert,  Well-developed, well-nourished, pleasant and cooperative in NAD Head:  Normocephalic and atraumatic. Eyes:  Sclera clear, no icterus.   Conjunctiva pink. Ears:  Normal auditory acuity. Neck:  Supple; no masses or thyromegaly. Lungs:  Respirations even and unlabored.  Clear throughout to auscultation.   No wheezes, crackles, or rhonchi. No acute distress. Heart:  Regular rate and rhythm; no murmurs, clicks, rubs, or gallops. Abdomen:  Normal bowel sounds.  No bruits.  Soft, non-tender and non-distended without masses, hepatosplenomegaly or hernias noted.  No guarding or rebound tenderness.  Negative Carnett sign.   Rectal:  Deferred.  Pulses:  Normal pulses noted. Extremities:  No clubbing or edema.  No  cyanosis. Neurologic:  Alert and oriented x3;  grossly normal neurologically. Skin:  Intact without significant lesions or rashes.  No jaundice. Lymph Nodes:  No significant cervical adenopathy. Psych:  Alert and cooperative. Normal mood and affect.  Imaging Studies: No results found.  Assessment and Plan:   Karina Leonard is a 79 y.o. y/o female who comes in today with diarrhea and a history of a tubulovillous adenoma and a previous colonoscopy.  The patient has been told that lactose intolerance can cause her to have diarrhea and she should try to avoid dairy products for 1 week to see if her symptoms improve.  The patient has also been told to avoid foods that may be causing her to have diarrhea.  The patient will be set up for a colonoscopy due to her history of a tubulovillous adenoma.  The patient has been explained the plan and agrees with it.    Karina Lame, MD. Marval Regal    Note: This dictation was prepared with Dragon dictation along with smaller phrase technology. Any transcriptional errors that result from this process are unintentional.

## 2020-03-08 ENCOUNTER — Ambulatory Visit (INDEPENDENT_AMBULATORY_CARE_PROVIDER_SITE_OTHER): Payer: Medicare HMO | Admitting: Family Medicine

## 2020-03-08 ENCOUNTER — Other Ambulatory Visit: Payer: Self-pay

## 2020-03-08 ENCOUNTER — Encounter: Payer: Self-pay | Admitting: Family Medicine

## 2020-03-08 VITALS — BP 156/84 | HR 58 | Ht 67.0 in | Wt 198.0 lb

## 2020-03-08 DIAGNOSIS — I10 Essential (primary) hypertension: Secondary | ICD-10-CM | POA: Diagnosis not present

## 2020-03-08 DIAGNOSIS — R4189 Other symptoms and signs involving cognitive functions and awareness: Secondary | ICD-10-CM

## 2020-03-08 DIAGNOSIS — Z9119 Patient's noncompliance with other medical treatment and regimen: Secondary | ICD-10-CM

## 2020-03-08 DIAGNOSIS — Z91199 Patient's noncompliance with other medical treatment and regimen due to unspecified reason: Secondary | ICD-10-CM

## 2020-03-08 NOTE — Progress Notes (Signed)
Date:  03/08/2020   Name:  Karina Leonard   DOB:  07/21/1941   MRN:  [id number]   Chief Complaint: Hypertension (High blood pressure and swollen ankles. Been walking more and drinking mroe fluids. )  .  Hypertension This is a chronic problem. The current episode started more than 1 year ago. The problem has been waxing and waning since onset. The problem is uncontrolled. Pertinent negatives include no anxiety, blurred vision, chest pain, headaches, malaise/fatigue, neck pain, orthopnea, palpitations, peripheral edema, PND, shortness of breath or sweats. There are no associated agents to hypertension. There are no known risk factors for coronary artery disease. Past treatments include angiotensin blockers, diuretics and beta blockers (patient had stopped losartin/hctz). The current treatment provides moderate improvement. There are no compliance problems.  There is no history of angina, kidney disease, CAD/MI, CVA, heart failure, left ventricular hypertrophy, PVD or retinopathy. There is no history of chronic renal disease, hyperaldosteronism or a hypertension causing med.    Lab Results  Component Value Date   CREATININE 1.05 (H) 01/22/2020   BUN 17 01/22/2020   NA 144 01/22/2020   K 4.1 01/22/2020   CL 103 01/22/2020   CO2 26 01/22/2020   Lab Results  Component Value Date   CHOL 172 01/22/2020   HDL 60 01/22/2020   LDLCALC 101 (H) 01/22/2020   TRIG 56 01/22/2020   CHOLHDL 3.0 03/18/2019   No results found for: TSH No results found for: HGBA1C Lab Results  Component Value Date   WBC 10.8 01/22/2020   HGB 14.2 01/22/2020   HCT 41.1 01/22/2020   MCV 92 01/22/2020   PLT 243 01/22/2020   Lab Results  Component Value Date   ALT 6 07/28/2019   AST 18 07/28/2019   ALKPHOS 100 07/28/2019   BILITOT 0.4 07/28/2019     Review of Systems  Constitutional: Negative.  Negative for chills, fatigue, fever, malaise/fatigue and unexpected weight change.  HENT: Negative for  congestion, drooling, ear discharge, ear pain, rhinorrhea, sinus pressure, sneezing and sore throat.   Eyes: Negative for blurred vision, photophobia, pain, discharge, redness and itching.  Respiratory: Negative for cough, shortness of breath, wheezing and stridor.   Cardiovascular: Negative for chest pain, palpitations, orthopnea, leg swelling and PND.  Gastrointestinal: Negative for abdominal pain, blood in stool, constipation, diarrhea, nausea and vomiting.  Endocrine: Negative for cold intolerance, heat intolerance, polydipsia, polyphagia and polyuria.  Genitourinary: Negative for dysuria, flank pain, frequency, hematuria, menstrual problem, pelvic pain, urgency, vaginal bleeding and vaginal discharge.  Musculoskeletal: Negative for arthralgias, back pain, myalgias and neck pain.  Skin: Negative for rash.  Allergic/Immunologic: Negative for environmental allergies and food allergies.  Neurological: Negative for dizziness, weakness, light-headedness, numbness and headaches.  Hematological: Negative for adenopathy. Does not bruise/bleed easily.  Psychiatric/Behavioral: Negative for dysphoric mood and suicidal ideas. The patient is not nervous/anxious.     Patient Active Problem List   Diagnosis Date Noted  . Need for vaccination 03/18/2019  . H/O hypercholesterolemia 03/18/2019  . Drug-induced obesity 03/18/2019  . Coronary artery disease 03/18/2019  . Cataract cortical, senile 03/18/2019  . Taking medication for chronic disease 03/18/2019  . Numbness and tingling of right upper extremity 08/15/2016  . Lipoma of right upper extremity 08/15/2016  . Cerebral microvascular disease 07/23/2015  . Familial multiple lipoprotein-type hyperlipidemia 02/16/2015  . Anxiety attack 02/16/2015  . Recurrent major depressive episodes ([place]) 02/16/2015  . Essential (primary) hypertension 02/16/2015  . H/O: HTN (hypertension) 02/16/2015  .  Asymptomatic hypertensive urgency 11/14/2014  . Tubulovillous  adenoma of rectum 10/21/2014  . Depression 10/21/2014  . Nuclear sclerosis of both eyes 07/07/2014    Allergies  Allergen Reactions  . Amoxicillin Hives  . Penicillins     Past Surgical History:  Procedure Laterality Date  . GALLBLADDER SURGERY  10/24/2007  . polyp removed     rectum- benign    Social History   Tobacco Use  . Smoking status: Former Smoker    Packs/day: 1.00    Years: 20.00    Pack years: 20.00    Types: Cigarettes    Quit date: 64    Years since quitting: 54.4  . Smokeless tobacco: Never Used  Substance Use Topics  . Alcohol use: Yes    Alcohol/week: 0.0 standard drinks    Comment: special occasions  . Drug use: No     Medication list has been reviewed and updated.  Current Meds  Medication Sig  . clopidogrel (PLAVIX) 75 MG tablet Take 1 tablet (75 mg total) by mouth daily.  . cyanocobalamin (,VITAMIN B-12,) 1000 MCG/ML injection   . donepezil (ARICEPT) 5 MG tablet   . fexofenadine (ALLEGRA) 180 MG tablet Take 1 tablet (180 mg total) by mouth daily. otc  . losartan-hydrochlorothiazide (HYZAAR) 100-25 MG tablet Take 1 tablet by mouth daily.  Marland Kitchen lovastatin (MEVACOR) 20 MG tablet TAKE 2 TABLETS (40 MG TOTAL) BY MOUTH DAILY.  . metoprolol succinate (TOPROL-XL) 100 MG 24 hr tablet Take 1 tablet (100 mg total) by mouth daily.  . Sodium Sulfate-Mag Sulfate-KCl (SUTAB) [phone] MG TABS Take 376 mg by mouth as directed.  . venlafaxine XR (EFFEXOR-XR) 75 MG 24 hr capsule Take 1 capsule (75 mg total) by mouth daily.    PHQ 2/9 Scores 03/08/2020 02/04/2020 01/21/2020 03/18/2019  PHQ - 2 Score 0 0 0 0  PHQ- 9 Score 0 - 1 0    BP Readings from Last 3 Encounters:  03/08/20 (!) 166/82  01/21/20 130/80  07/23/19 130/78    Physical Exam Vitals and nursing note reviewed.  Constitutional:      Appearance: She is well-developed.  HENT:     Head: Normocephalic.     Right Ear: Tympanic membrane, ear canal and external ear normal.     Left Ear: Tympanic  membrane, ear canal and external ear normal.     Nose: Nose normal.     Mouth/Throat:     Mouth: Mucous membranes are moist.  Eyes:     General: Lids are everted, no foreign bodies appreciated. No scleral icterus.       Left eye: No foreign body or hordeolum.     Conjunctiva/sclera: Conjunctivae normal.     Right eye: Right conjunctiva is not injected.     Left eye: Left conjunctiva is not injected.     Pupils: Pupils are equal, round, and reactive to light.  Neck:     Thyroid: No thyromegaly.     Vascular: No carotid bruit or JVD.     Trachea: No tracheal deviation.  Cardiovascular:     Rate and Rhythm: Normal rate and regular rhythm.     Heart sounds: Normal heart sounds. No murmur. No friction rub. No gallop.   Pulmonary:     Effort: Pulmonary effort is normal. No respiratory distress.     Breath sounds: Normal breath sounds. No wheezing, rhonchi or rales.  Abdominal:     General: Bowel sounds are normal.     Palpations: Abdomen is soft. There  is no mass.     Tenderness: There is no abdominal tenderness. There is no guarding or rebound.  Musculoskeletal:        General: No tenderness. Normal range of motion.     Cervical back: Normal range of motion and neck supple.  Lymphadenopathy:     Cervical: No cervical adenopathy.  Skin:    General: Skin is warm.     Findings: No rash.  Neurological:     Mental Status: She is alert and oriented to person, place, and time.     Cranial Nerves: No cranial nerve deficit.     Deep Tendon Reflexes: Reflexes normal.  Psychiatric:        Mood and Affect: Mood is not anxious or depressed.     Wt Readings from Last 3 Encounters:  03/08/20 198 lb (89.8 kg)  03/04/20 197 lb 3.2 oz (89.4 kg)  01/21/20 200 lb (90.7 kg)    BP (!) 166/82   Pulse (!) 58   Ht 5\' 7"  (1.702 m)   Wt 198 lb (89.8 kg)   SpO2 98%   BMI 31.01 kg/m   Assessment and Plan: 1. Essential (primary) hypertension Chronic.  Uncontrolled.  Relatively stable.  As  patient has just resumed her medication about a week ago and is in the process of obtaining medication stability.  For some unknown reason patient stopped her medication for at least 2 weeks and then upon evaluation by Dr. Blenda Bridegroom was noted to be elevated.  Patient has resumed her losartan hydrochlorothiazide whereupon she was only taking her metoprolol.  Blood pressure is improved from previous readings however I do not think it has reached maximum stabilization.  I have given patient a Dash diet and sodium reduction concerns.  We will recheck patient in 1 week encouraged her to continue her current regimen of losartan hydrochlorothiazide and metoprolol.  I am hesitant at this time to start amlodipine given that she is also been complaining about some lower edema but that has improved since resuming her diuresis.  Pressure was repeated and this had decreased to 156/84 at the end of the visit.  This is still elevated and this is why we will going to give further time for stabilization of blood pressure on medication in addition to avoidance of sodium.  2. Noncompliance Patient has not been taking her full regimen of medication for unknown reason that she attributed to her stress of taking care of her spouse.  We have discussed that even though this was unintentional that if still necessary to have compliance to keep maintain homeostasis of her blood pressure.  This was briefly discussed with her daughter after the clinic visit by cell phone with permission of the patient.  We will discussed with her that this needs to be further emphasized with a medication dosage box to ensure that patient is taking her medications on a regular basis.  3. Cognitive impairment Patient has seen Dr. Brigitte Pulse neurology for cognitive impairment for which it looks like he has initiated Aricept.  She is also having an MRI done as well to evaluate per neurology.  I am concerned that this may have had some contribution to her inability to  remember to take all her medication and this will be reviewed with her at her next visit in 1 week.

## 2020-03-08 NOTE — Patient Instructions (Addendum)
Low-Sodium Eating Plan Sodium, which is an element that makes up salt, helps you maintain a healthy balance of fluids in your body. Too much sodium can increase your blood pressure and cause fluid and waste to be held in your body. Your health care provider or dietitian may recommend following this plan if you have high blood pressure (hypertension), kidney disease, liver disease, or heart failure. Eating less sodium can help lower your blood pressure, reduce swelling, and protect your heart, liver, and kidneys. What are tips for following this plan? General guidelines  Most people on this plan should limit their sodium intake to 1,500-2,000 mg (milligrams) of sodium each day. Reading food labels   The Nutrition Facts label lists the amount of sodium in one serving of the food. If you eat more than one serving, you must multiply the listed amount of sodium by the number of servings.  Choose foods with less than 140 mg of sodium per serving.  Avoid foods with 300 mg of sodium or more per serving. Shopping  Look for lower-sodium products, often labeled as "low-sodium" or "no salt added."  Always check the sodium content even if foods are labeled as "unsalted" or "no salt added".  Buy fresh foods. ? Avoid canned foods and premade or frozen meals. ? Avoid canned, cured, or processed meats  Buy breads that have less than 80 mg of sodium per slice. Cooking  Eat more home-cooked food and less restaurant, buffet, and fast food.  Avoid adding salt when cooking. Use salt-free seasonings or herbs instead of table salt or sea salt. Check with your health care provider or pharmacist before using salt substitutes.  Cook with plant-based oils, such as canola, sunflower, or olive oil. Meal planning  When eating at a restaurant, ask that your food be prepared with less salt or no salt, if possible.  Avoid foods that contain MSG (monosodium glutamate). MSG is sometimes added to Chinese food,  bouillon, and some canned foods. What foods are recommended? The items listed may not be a complete list. Talk with your dietitian about what dietary choices are best for you. Grains Low-sodium cereals, including oats, puffed wheat and rice, and shredded wheat. Low-sodium crackers. Unsalted rice. Unsalted pasta. Low-sodium bread. Whole-grain breads and whole-grain pasta. Vegetables Fresh or frozen vegetables. "No salt added" canned vegetables. "No salt added" tomato sauce and paste. Low-sodium or reduced-sodium tomato and vegetable juice. Fruits Fresh, frozen, or canned fruit. Fruit juice. Meats and other protein foods Fresh or frozen (no salt added) meat, poultry, seafood, and fish. Low-sodium canned tuna and salmon. Unsalted nuts. Dried peas, beans, and lentils without added salt. Unsalted canned beans. Eggs. Unsalted nut butters. Dairy Milk. Soy milk. Cheese that is naturally low in sodium, such as ricotta cheese, fresh mozzarella, or Swiss cheese Low-sodium or reduced-sodium cheese. Cream cheese. Yogurt. Fats and oils Unsalted butter. Unsalted margarine with no trans fat. Vegetable oils such as canola or olive oils. Seasonings and other foods Fresh and dried herbs and spices. Salt-free seasonings. Low-sodium mustard and ketchup. Sodium-free salad dressing. Sodium-free light mayonnaise. Fresh or refrigerated horseradish. Lemon juice. Vinegar. Homemade, reduced-sodium, or low-sodium soups. Unsalted popcorn and pretzels. Low-salt or salt-free chips. What foods are not recommended? The items listed may not be a complete list. Talk with your dietitian about what dietary choices are best for you. Grains Instant hot cereals. Bread stuffing, pancake, and biscuit mixes. Croutons. Seasoned rice or pasta mixes. Noodle soup cups. Boxed or frozen macaroni and cheese. Regular salted   crackers. Self-rising flour. Vegetables Sauerkraut, pickled vegetables, and relishes. Olives. French fries. Onion rings.  Regular canned vegetables (not low-sodium or reduced-sodium). Regular canned tomato sauce and paste (not low-sodium or reduced-sodium). Regular tomato and vegetable juice (not low-sodium or reduced-sodium). Frozen vegetables in sauces. Meats and other protein foods Meat or fish that is salted, canned, smoked, spiced, or pickled. Bacon, ham, sausage, hotdogs, corned beef, chipped beef, packaged lunch meats, salt pork, jerky, pickled herring, anchovies, regular canned tuna, sardines, salted nuts. Dairy Processed cheese and cheese spreads. Cheese curds. Blue cheese. Feta cheese. String cheese. Regular cottage cheese. Buttermilk. Canned milk. Fats and oils Salted butter. Regular margarine. Ghee. Bacon fat. Seasonings and other foods Onion salt, garlic salt, seasoned salt, table salt, and sea salt. Canned and packaged gravies. Worcestershire sauce. Tartar sauce. Barbecue sauce. Teriyaki sauce. Soy sauce, including reduced-sodium. Steak sauce. Fish sauce. Oyster sauce. Cocktail sauce. Horseradish that you find on the shelf. Regular ketchup and mustard. Meat flavorings and tenderizers. Bouillon cubes. Hot sauce and Tabasco sauce. Premade or packaged marinades. Premade or packaged taco seasonings. Relishes. Regular salad dressings. Salsa. Potato and tortilla chips. Corn chips and puffs. Salted popcorn and pretzels. Canned or dried soups. Pizza. Frozen entrees and pot pies. Summary  Eating less sodium can help lower your blood pressure, reduce swelling, and protect your heart, liver, and kidneys.  Most people on this plan should limit their sodium intake to 1,500-2,000 mg (milligrams) of sodium each day.  Canned, boxed, and frozen foods are high in sodium. Restaurant foods, fast foods, and pizza are also very high in sodium. You also get sodium by adding salt to food.  Try to cook at home, eat more fresh fruits and vegetables, and eat less fast food, canned, processed, or prepared foods. This information is  not intended to replace advice given to you by your health care provider. Make sure you discuss any questions you have with your health care provider. Document Revised: 09/21/2017 Document Reviewed: 10/02/2016 Elsevier Patient Education  2020 [employer] DASH Eating Plan DASH stands for "Dietary Approaches to Stop Hypertension." The DASH eating plan is a healthy eating plan that has been shown to reduce high blood pressure (hypertension). It may also reduce your risk for type 2 diabetes, heart disease, and stroke. The DASH eating plan may also help with weight loss. What are tips for following this plan?  General guidelines  Avoid eating more than 2,300 mg (milligrams) of salt (sodium) a day. If you have hypertension, you may need to reduce your sodium intake to 1,500 mg a day.  Limit alcohol intake to no more than 1 drink a day for nonpregnant women and 2 drinks a day for men. One drink equals 12 oz of beer, 5 oz of wine, or 1 oz of hard liquor.  Work with your health care provider to maintain a healthy body weight or to lose weight. Ask what an ideal weight is for you.  Get at least 30 minutes of exercise that causes your heart to beat faster (aerobic exercise) most days of the week. Activities may include walking, swimming, or biking.  Work with your health care provider or diet and nutrition specialist (dietitian) to adjust your eating plan to your individual calorie needs. Reading food labels   Check food labels for the amount of sodium per serving. Choose foods with less than 5 percent of the Daily Value of sodium. Generally, foods with less than 300 mg of sodium per serving fit into this eating plan.    To find whole grains, look for the word "whole" as the first word in the ingredient list. Shopping  Buy products labeled as "low-sodium" or "no salt added."  Buy fresh foods. Avoid canned foods and premade or frozen meals. Cooking  Avoid adding salt when cooking. Use salt-free  seasonings or herbs instead of table salt or sea salt. Check with your health care provider or pharmacist before using salt substitutes.  Do not fry foods. Cook foods using healthy methods such as baking, boiling, grilling, and broiling instead.  Cook with heart-healthy oils, such as olive, canola, soybean, or sunflower oil. Meal planning  Eat a balanced diet that includes: ? 5 or more servings of fruits and vegetables each day. At each meal, try to fill half of your plate with fruits and vegetables. ? Up to 6-8 servings of whole grains each day. ? Less than 6 oz of lean meat, poultry, or fish each day. A 3-oz serving of meat is about the same size as a deck of cards. One egg equals 1 oz. ? 2 servings of low-fat dairy each day. ? A serving of nuts, seeds, or beans 5 times each week. ? Heart-healthy fats. Healthy fats called Omega-3 fatty acids are found in foods such as flaxseeds and coldwater fish, like sardines, salmon, and mackerel.  Limit how much you eat of the following: ? Canned or prepackaged foods. ? Food that is high in trans fat, such as fried foods. ? Food that is high in saturated fat, such as fatty meat. ? Sweets, desserts, sugary drinks, and other foods with added sugar. ? Full-fat dairy products.  Do not salt foods before eating.  Try to eat at least 2 vegetarian meals each week.  Eat more home-cooked food and less restaurant, buffet, and fast food.  When eating at a restaurant, ask that your food be prepared with less salt or no salt, if possible. What foods are recommended? The items listed may not be a complete list. Talk with your dietitian about what dietary choices are best for you. Grains Whole-grain or whole-wheat bread. Whole-grain or whole-wheat pasta. Brown rice. Oatmeal. Quinoa. Bulgur. Whole-grain and low-sodium cereals. Pita bread. Low-fat, low-sodium crackers. Whole-wheat flour tortillas. Vegetables Fresh or frozen vegetables (raw, steamed, roasted, or  grilled). Low-sodium or reduced-sodium tomato and vegetable juice. Low-sodium or reduced-sodium tomato sauce and tomato paste. Low-sodium or reduced-sodium canned vegetables. Fruits All fresh, dried, or frozen fruit. Canned fruit in natural juice (without added sugar). Meat and other protein foods Skinless chicken or turkey. Ground chicken or turkey. Pork with fat trimmed off. Fish and seafood. Egg whites. Dried beans, peas, or lentils. Unsalted nuts, nut butters, and seeds. Unsalted canned beans. Lean cuts of beef with fat trimmed off. Low-sodium, lean deli meat. Dairy Low-fat (1%) or fat-free (skim) milk. Fat-free, low-fat, or reduced-fat cheeses. Nonfat, low-sodium ricotta or cottage cheese. Low-fat or nonfat yogurt. Low-fat, low-sodium cheese. Fats and oils Soft margarine without trans fats. Vegetable oil. Low-fat, reduced-fat, or light mayonnaise and salad dressings (reduced-sodium). Canola, safflower, olive, soybean, and sunflower oils. Avocado. Seasoning and other foods Herbs. Spices. Seasoning mixes without salt. Unsalted popcorn and pretzels. Fat-free sweets. What foods are not recommended? The items listed may not be a complete list. Talk with your dietitian about what dietary choices are best for you. Grains Baked goods made with fat, such as croissants, muffins, or some breads. Dry pasta or rice meal packs. Vegetables Creamed or fried vegetables. Vegetables in a cheese sauce. Regular canned vegetables (not   low-sodium or reduced-sodium). Regular canned tomato sauce and paste (not low-sodium or reduced-sodium). Regular tomato and vegetable juice (not low-sodium or reduced-sodium). Pickles. Olives. Fruits Canned fruit in a light or heavy syrup. Fried fruit. Fruit in cream or butter sauce. Meat and other protein foods Fatty cuts of meat. Ribs. Fried meat. Bacon. Sausage. Bologna and other processed lunch meats. Salami. Fatback. Hotdogs. Bratwurst. Salted nuts and seeds. Canned beans with  added salt. Canned or smoked fish. Whole eggs or egg yolks. Chicken or turkey with skin. Dairy Whole or 2% milk, cream, and half-and-half. Whole or full-fat cream cheese. Whole-fat or sweetened yogurt. Full-fat cheese. Nondairy creamers. Whipped toppings. Processed cheese and cheese spreads. Fats and oils Butter. Stick margarine. Lard. Shortening. Ghee. Bacon fat. Tropical oils, such as coconut, palm kernel, or palm oil. Seasoning and other foods Salted popcorn and pretzels. Onion salt, garlic salt, seasoned salt, table salt, and sea salt. Worcestershire sauce. Tartar sauce. Barbecue sauce. Teriyaki sauce. Soy sauce, including reduced-sodium. Steak sauce. Canned and packaged gravies. Fish sauce. Oyster sauce. Cocktail sauce. Horseradish that you find on the shelf. Ketchup. Mustard. Meat flavorings and tenderizers. Bouillon cubes. Hot sauce and Tabasco sauce. Premade or packaged marinades. Premade or packaged taco seasonings. Relishes. Regular salad dressings. Where to find more information:  National Heart, Lung, and Blood Institute: www.nhlbi.nih.gov  American Heart Association: www.heart.org Summary  The DASH eating plan is a healthy eating plan that has been shown to reduce high blood pressure (hypertension). It may also reduce your risk for type 2 diabetes, heart disease, and stroke.  With the DASH eating plan, you should limit salt (sodium) intake to 2,300 mg a day. If you have hypertension, you may need to reduce your sodium intake to 1,500 mg a day.  When on the DASH eating plan, aim to eat more fresh fruits and vegetables, whole grains, lean proteins, low-fat dairy, and heart-healthy fats.  Work with your health care provider or diet and nutrition specialist (dietitian) to adjust your eating plan to your individual calorie needs. This information is not intended to replace advice given to you by your health care provider. Make sure you discuss any questions you have with your health care  provider. Document Revised: 09/21/2017 Document Reviewed: 10/02/2016 Elsevier Patient Education  2020 Elsevier Inc.  

## 2020-03-18 ENCOUNTER — Other Ambulatory Visit: Payer: Self-pay

## 2020-03-18 ENCOUNTER — Ambulatory Visit (INDEPENDENT_AMBULATORY_CARE_PROVIDER_SITE_OTHER): Payer: Medicare HMO | Admitting: Family Medicine

## 2020-03-18 ENCOUNTER — Encounter: Payer: Self-pay | Admitting: Family Medicine

## 2020-03-18 VITALS — BP 124/70 | HR 64 | Ht 67.0 in | Wt 198.0 lb

## 2020-03-18 DIAGNOSIS — I10 Essential (primary) hypertension: Secondary | ICD-10-CM

## 2020-03-18 DIAGNOSIS — R739 Hyperglycemia, unspecified: Secondary | ICD-10-CM

## 2020-03-18 NOTE — Progress Notes (Signed)
Date:  03/18/2020   Name:  Karina Leonard   DOB:  1941-09-14   MRN:  [id number]   Chief Complaint: Hypertension (pt has been taking med as directed) and Prediabetes (needs A1C and foot exam)  Hypertension This is a chronic problem. The current episode started more than 1 year ago. The problem has been gradually improving since onset. The problem is controlled. Pertinent negatives include no anxiety, blurred vision, chest pain, headaches, malaise/fatigue, neck pain, orthopnea, palpitations, peripheral edema, PND, shortness of breath or sweats. There are no associated agents to hypertension. There are no known risk factors for coronary artery disease. Past treatments include beta blockers, diuretics and angiotensin blockers. The current treatment provides moderate improvement. There are no compliance problems.  There is no history of angina, kidney disease, CAD/MI, CVA, heart failure, left ventricular hypertrophy, PVD or retinopathy. There is no history of chronic renal disease, a hypertension causing med or renovascular disease.  Diabetes Pertinent negatives for hypoglycemia include no dizziness, headaches, nervousness/anxiousness or sweats. Pertinent negatives for diabetes include no blurred vision, no chest pain, no fatigue, no polydipsia, no polyphagia, no polyuria and no weakness. Pertinent negatives for diabetic complications include no CVA, PVD or retinopathy.    Lab Results  Component Value Date   CREATININE 1.05 (H) 01/22/2020   BUN 17 01/22/2020   NA 144 01/22/2020   K 4.1 01/22/2020   CL 103 01/22/2020   CO2 26 01/22/2020   Lab Results  Component Value Date   CHOL 172 01/22/2020   HDL 60 01/22/2020   LDLCALC 101 (H) 01/22/2020   TRIG 56 01/22/2020   CHOLHDL 3.0 03/18/2019   No results found for: TSH No results found for: HGBA1C Lab Results  Component Value Date   WBC 10.8 01/22/2020   HGB 14.2 01/22/2020   HCT 41.1 01/22/2020   MCV 92 01/22/2020   PLT 243 01/22/2020    Lab Results  Component Value Date   ALT 6 07/28/2019   AST 18 07/28/2019   ALKPHOS 100 07/28/2019   BILITOT 0.4 07/28/2019     Review of Systems  Constitutional: Negative.  Negative for chills, fatigue, fever, malaise/fatigue and unexpected weight change.  HENT: Negative for congestion, ear discharge, ear pain, rhinorrhea, sinus pressure, sneezing and sore throat.   Eyes: Negative for blurred vision, photophobia, pain, discharge, redness and itching.  Respiratory: Negative for cough, shortness of breath, wheezing and stridor.   Cardiovascular: Negative for chest pain, palpitations, orthopnea and PND.  Gastrointestinal: Negative for abdominal pain, blood in stool, constipation, diarrhea, nausea and vomiting.  Endocrine: Negative for cold intolerance, heat intolerance, polydipsia, polyphagia and polyuria.  Genitourinary: Negative for dysuria, flank pain, frequency, hematuria, menstrual problem, pelvic pain, urgency, vaginal bleeding and vaginal discharge.  Musculoskeletal: Negative for arthralgias, back pain, myalgias and neck pain.  Skin: Negative for rash.  Allergic/Immunologic: Negative for environmental allergies and food allergies.  Neurological: Negative for dizziness, weakness, light-headedness, numbness and headaches.  Hematological: Negative for adenopathy. Does not bruise/bleed easily.  Psychiatric/Behavioral: Negative for dysphoric mood. The patient is not nervous/anxious.     Patient Active Problem List   Diagnosis Date Noted  . Need for vaccination 03/18/2019  . H/O hypercholesterolemia 03/18/2019  . Drug-induced obesity 03/18/2019  . Coronary artery disease 03/18/2019  . Cataract cortical, senile 03/18/2019  . Taking medication for chronic disease 03/18/2019  . Numbness and tingling of right upper extremity 08/15/2016  . Lipoma of right upper extremity 08/15/2016  . Cerebral microvascular disease 07/23/2015  .  Familial multiple lipoprotein-type hyperlipidemia  02/16/2015  . Anxiety attack 02/16/2015  . Recurrent major depressive episodes ([place]) 02/16/2015  . Essential (primary) hypertension 02/16/2015  . H/O: HTN (hypertension) 02/16/2015  . Asymptomatic hypertensive urgency 11/14/2014  . Tubulovillous adenoma of rectum 10/21/2014  . Depression 10/21/2014  . Nuclear sclerosis of both eyes 07/07/2014    Allergies  Allergen Reactions  . Amoxicillin Hives  . Penicillins     Past Surgical History:  Procedure Laterality Date  . GALLBLADDER SURGERY  10/24/2007  . polyp removed     rectum- benign    Social History   Tobacco Use  . Smoking status: Former Smoker    Packs/day: 1.00    Years: 20.00    Pack years: 20.00    Types: Cigarettes    Quit date: 58    Years since quitting: 54.4  . Smokeless tobacco: Never Used  Substance Use Topics  . Alcohol use: Yes    Alcohol/week: 0.0 standard drinks    Comment: special occasions  . Drug use: No     Medication list has been reviewed and updated.  Current Meds  Medication Sig  . clopidogrel (PLAVIX) 75 MG tablet Take 1 tablet (75 mg total) by mouth daily.  . cyanocobalamin (,VITAMIN B-12,) 1000 MCG/ML injection   . donepezil (ARICEPT) 5 MG tablet   . fexofenadine (ALLEGRA) 180 MG tablet Take 1 tablet (180 mg total) by mouth daily. otc  . losartan-hydrochlorothiazide (HYZAAR) 100-25 MG tablet Take 1 tablet by mouth daily.  Marland Kitchen lovastatin (MEVACOR) 20 MG tablet TAKE 2 TABLETS (40 MG TOTAL) BY MOUTH DAILY.  . metoprolol succinate (TOPROL-XL) 100 MG 24 hr tablet Take 1 tablet (100 mg total) by mouth daily.  . Sodium Sulfate-Mag Sulfate-KCl (SUTAB) [phone] MG TABS Take 376 mg by mouth as directed.  . venlafaxine XR (EFFEXOR-XR) 75 MG 24 hr capsule Take 1 capsule (75 mg total) by mouth daily.    PHQ 2/9 Scores 03/18/2020 03/08/2020 02/04/2020 01/21/2020  PHQ - 2 Score 0 0 0 0  PHQ- 9 Score 0 0 - 1    BP Readings from Last 3 Encounters:  03/18/20 124/70  03/08/20 (!) 156/84    01/21/20 130/80    Physical Exam Vitals and nursing note reviewed.  Constitutional:      Appearance: She is well-developed.  HENT:     Head: Normocephalic.     Right Ear: Tympanic membrane, ear canal and external ear normal. There is no impacted cerumen.     Left Ear: Tympanic membrane, ear canal and external ear normal. There is no impacted cerumen.     Nose: Nose normal. No congestion or rhinorrhea.     Mouth/Throat:     Mouth: Mucous membranes are moist.  Eyes:     General: Lids are everted, no foreign bodies appreciated. No scleral icterus.       Left eye: No foreign body or hordeolum.     Conjunctiva/sclera: Conjunctivae normal.     Right eye: Right conjunctiva is not injected.     Left eye: Left conjunctiva is not injected.     Pupils: Pupils are equal, round, and reactive to light.  Neck:     Thyroid: No thyromegaly.     Vascular: No JVD.     Trachea: No tracheal deviation.  Cardiovascular:     Rate and Rhythm: Normal rate and regular rhythm.     Pulses:          Dorsalis pedis pulses are 2+ on the  right side and 2+ on the left side.       Posterior tibial pulses are 2+ on the right side and 2+ on the left side.     Heart sounds: Normal heart sounds. No murmur. No friction rub. No gallop.   Pulmonary:     Effort: Pulmonary effort is normal. No respiratory distress.     Breath sounds: Normal breath sounds. No wheezing or rales.  Abdominal:     General: Bowel sounds are normal.     Palpations: Abdomen is soft. There is no mass.     Tenderness: There is no abdominal tenderness. There is no guarding or rebound.  Musculoskeletal:        General: No tenderness. Normal range of motion.     Cervical back: Normal range of motion and neck supple.  Feet:     Right foot:     Protective Sensation: 10 sites tested. 10 sites sensed.     Skin integrity: Skin integrity normal.     Toenail Condition: Right toenails are normal.     Left foot:     Protective Sensation: 10 sites  tested. 10 sites sensed.     Skin integrity: Skin integrity normal.     Toenail Condition: Left toenails are normal.  Lymphadenopathy:     Cervical: No cervical adenopathy.  Skin:    General: Skin is warm.     Findings: No rash.  Neurological:     Mental Status: She is alert and oriented to person, place, and time.     Cranial Nerves: No cranial nerve deficit.     Deep Tendon Reflexes: Reflexes normal.  Psychiatric:        Mood and Affect: Mood is not anxious or depressed.     Wt Readings from Last 3 Encounters:  03/18/20 198 lb (89.8 kg)  03/08/20 198 lb (89.8 kg)  03/04/20 197 lb 3.2 oz (89.4 kg)    BP 124/70   Pulse 64   Ht 5\' 7"  (1.702 m)   Wt 198 lb (89.8 kg)   BMI 31.01 kg/m   Assessment and Plan:  1. Hyperglycemia Noted on previous labs the patient on 2 occasions has a slightly elevated glucose.  Although I do not think patient is diabetic we will check an A1c to determine if there is a prediabetic disposition. - Hemoglobin A1c  2. Essential (primary) hypertension Patient returns after elevated blood pressure off of medication.  Since last evaluation patient has resumed her losartan hydrochlorothiazide 100-25 mg as well as continuance of her metoprolol XL 100 mg daily blood pressure obtained today is excellent at 124/70.  We will not need to make any changes at this time but we will check in 6 months her regular visit.  Patient is reminded that she has an MRI tomorrow as well as an upcoming appointment with colonoscopy.

## 2020-03-19 ENCOUNTER — Ambulatory Visit (HOSPITAL_COMMUNITY)
Admission: RE | Admit: 2020-03-19 | Discharge: 2020-03-19 | Disposition: A | Discharge status: Home / Self Care | Payer: Medicare HMO | Source: Ambulatory Visit | Attending: Neurology | Admitting: Neurology

## 2020-03-19 DIAGNOSIS — R930 Abnormal findings on diagnostic imaging of skull and head, not elsewhere classified: Secondary | ICD-10-CM | POA: Diagnosis not present

## 2020-03-19 DIAGNOSIS — R4189 Other symptoms and signs involving cognitive functions and awareness: Secondary | ICD-10-CM | POA: Diagnosis not present

## 2020-03-19 DIAGNOSIS — G3184 Mild cognitive impairment, so stated: Secondary | ICD-10-CM | POA: Diagnosis not present

## 2020-03-19 LAB — HEMOGLOBIN A1C
Est. average glucose Bld gHb Est-mCnc: 126 mg/dL
Hgb A1c MFr Bld: 6 % — ABNORMAL HIGH (ref 4.8–5.6)

## 2020-03-27 ENCOUNTER — Other Ambulatory Visit: Payer: Self-pay | Admitting: Family Medicine

## 2020-03-27 DIAGNOSIS — I6789 Other cerebrovascular disease: Secondary | ICD-10-CM

## 2020-03-27 NOTE — Telephone Encounter (Signed)
Last ordered 01/21/20 #90 tabs with 1 refill. Should have one refill 90 days supply on file. Requesting refill too soon-request denied.

## 2020-04-01 ENCOUNTER — Other Ambulatory Visit: Payer: Self-pay

## 2020-04-01 ENCOUNTER — Encounter: Payer: Self-pay | Admitting: Gastroenterology

## 2020-04-02 ENCOUNTER — Other Ambulatory Visit: Payer: Self-pay

## 2020-04-02 MED ORDER — PEG 3350-KCL-NA BICARB-NACL 420 G PO SOLR
ORAL | 0 refills | Status: DC
Start: 2020-04-02 — End: 2020-04-12

## 2020-04-07 ENCOUNTER — Other Ambulatory Visit: Payer: Self-pay

## 2020-04-07 ENCOUNTER — Other Ambulatory Visit
Admission: RE | Admit: 2020-04-07 | Discharge: 2020-04-07 | Disposition: A | Discharge status: Home / Self Care | Payer: Medicare HMO | Source: Ambulatory Visit | Attending: Gastroenterology | Admitting: Gastroenterology

## 2020-04-07 DIAGNOSIS — Z20822 Contact with and (suspected) exposure to covid-19: Secondary | ICD-10-CM | POA: Insufficient documentation

## 2020-04-07 DIAGNOSIS — Z01812 Encounter for preprocedural laboratory examination: Secondary | ICD-10-CM | POA: Diagnosis not present

## 2020-04-08 LAB — SARS CORONAVIRUS 2 (TAT 6-24 HRS): SARS Coronavirus 2: NEGATIVE

## 2020-04-08 NOTE — Discharge Instructions (Signed)
General Anesthesia, Adult, Care After This sheet gives you information about how to care for yourself after your procedure. Your health care provider may also give you more specific instructions. If you have problems or questions, contact your health care provider. What can I expect after the procedure? After the procedure, the following side effects are common:  Pain or discomfort at the IV site.  Nausea.  Vomiting.  Sore throat.  Trouble concentrating.  Feeling cold or chills.  Weak or tired.  Sleepiness and fatigue.  Soreness and body aches. These side effects can affect parts of the body that were not involved in surgery. Follow these instructions at home:  For at least 24 hours after the procedure:  Have a responsible adult stay with you. It is important to have someone help care for you until you are awake and alert.  Rest as needed.  Do not: ? Participate in activities in which you could fall or become injured. ? Drive. ? Use heavy machinery. ? Drink alcohol. ? Take sleeping pills or medicines that cause drowsiness. ? Make important decisions or sign legal documents. ? Take care of children on your own. Eating and drinking  Follow any instructions from your health care provider about eating or drinking restrictions.  When you feel hungry, start by eating small amounts of foods that are soft and easy to digest (bland), such as toast. Gradually return to your regular diet.  Drink enough fluid to keep your urine pale yellow.  If you vomit, rehydrate by drinking water, juice, or clear broth. General instructions  If you have sleep apnea, surgery and certain medicines can increase your risk for breathing problems. Follow instructions from your health care provider about wearing your sleep device: ? Anytime you are sleeping, including during daytime naps. ? While taking prescription pain medicines, sleeping medicines, or medicines that make you drowsy.  Return to  your normal activities as told by your health care provider. Ask your health care provider what activities are safe for you.  Take over-the-counter and prescription medicines only as told by your health care provider.  If you smoke, do not smoke without supervision.  Keep all follow-up visits as told by your health care provider. This is important. Contact a health care provider if:  You have nausea or vomiting that does not get better with medicine.  You cannot eat or drink without vomiting.  You have pain that does not get better with medicine.  You are unable to pass urine.  You develop a skin rash.  You have a fever.  You have redness around your IV site that gets worse. Get help right away if:  You have difficulty breathing.  You have chest pain.  You have blood in your urine or stool, or you vomit blood. Summary  After the procedure, it is common to have a sore throat or nausea. It is also common to feel tired.  Have a responsible adult stay with you for the first 24 hours after general anesthesia. It is important to have someone help care for you until you are awake and alert.  When you feel hungry, start by eating small amounts of foods that are soft and easy to digest (bland), such as toast. Gradually return to your regular diet.  Drink enough fluid to keep your urine pale yellow.  Return to your normal activities as told by your health care provider. Ask your health care provider what activities are safe for you. This information is not   intended to replace advice given to you by your health care provider. Make sure you discuss any questions you have with your health care provider. Document Revised: 10/12/2017 Document Reviewed: 05/25/2017 Elsevier Patient Education  2020 Elsevier Inc.  

## 2020-04-09 ENCOUNTER — Telehealth: Payer: Self-pay | Admitting: Family Medicine

## 2020-04-09 ENCOUNTER — Other Ambulatory Visit: Payer: Self-pay

## 2020-04-09 ENCOUNTER — Ambulatory Visit: Payer: Medicare HMO | Admitting: Anesthesiology

## 2020-04-09 ENCOUNTER — Encounter: Payer: Self-pay | Admitting: Gastroenterology

## 2020-04-09 ENCOUNTER — Ambulatory Visit
Admission: RE | Admit: 2020-04-09 | Discharge: 2020-04-09 | Disposition: A | Discharge status: Home / Self Care | Payer: Medicare HMO | Attending: Gastroenterology | Admitting: Gastroenterology

## 2020-04-09 ENCOUNTER — Ambulatory Visit
Admission: EM | Admit: 2020-04-09 | Discharge: 2020-04-09 | Disposition: A | Discharge status: Home / Self Care | Payer: Medicare HMO | Source: Home / Self Care

## 2020-04-09 ENCOUNTER — Encounter
Admission: RE | Disposition: A | Discharge status: Home / Self Care | Payer: Self-pay | Source: Home / Self Care | Attending: Gastroenterology

## 2020-04-09 DIAGNOSIS — Z538 Procedure and treatment not carried out for other reasons: Secondary | ICD-10-CM | POA: Insufficient documentation

## 2020-04-09 DIAGNOSIS — I10 Essential (primary) hypertension: Secondary | ICD-10-CM | POA: Diagnosis not present

## 2020-04-09 DIAGNOSIS — Z1211 Encounter for screening for malignant neoplasm of colon: Secondary | ICD-10-CM | POA: Insufficient documentation

## 2020-04-09 DIAGNOSIS — Z86010 Personal history of colonic polyps: Secondary | ICD-10-CM | POA: Diagnosis not present

## 2020-04-09 DIAGNOSIS — R197 Diarrhea, unspecified: Secondary | ICD-10-CM

## 2020-04-09 HISTORY — DX: Presence of dental prosthetic device (complete) (partial): Z97.2

## 2020-04-09 SURGERY — COLONOSCOPY WITH PROPOFOL
Anesthesia: Monitor Anesthesia Care

## 2020-04-09 MED ORDER — LACTATED RINGERS IV SOLN: INTRAVENOUS | Status: DC

## 2020-04-09 MED ORDER — CLONIDINE HCL 0.1 MG PO TABS: 0.1000 mg | ORAL_TABLET | Freq: Two times a day (BID) | ORAL | 11 refills | Status: DC

## 2020-04-09 MED ORDER — CLONIDINE HCL 0.1 MG PO TABS
0.1000 mg | ORAL_TABLET | Freq: Once | ORAL | Status: AC
  Administered 2020-04-09: 0.1 mg via ORAL

## 2020-04-09 MED ORDER — STERILE WATER FOR IRRIGATION IR SOLN: Status: DC | PRN

## 2020-04-09 MED ORDER — SODIUM CHLORIDE 0.9 % IV SOLN: INTRAVENOUS | Status: DC

## 2020-04-09 MED ORDER — HYDRALAZINE HCL 20 MG/ML IJ SOLN
10.0000 mg | Freq: Once | INTRAMUSCULAR | Status: AC
  Administered 2020-04-09: 10 mg via INTRAVENOUS

## 2020-04-09 SURGICAL SUPPLY — 24 items
CLIP HMST 235XBRD CATH ROT (MISCELLANEOUS) IMPLANT
CLIP RESOLUTION 360 11X235 (MISCELLANEOUS)
ELECT REM PT RETURN 9FT ADLT (ELECTROSURGICAL)
ELECTRODE REM PT RTRN 9FT ADLT (ELECTROSURGICAL) IMPLANT
FCP ESCP3.2XJMB 240X2.8X (MISCELLANEOUS)
FORCEPS BIOP RAD 4 LRG CAP 4 (CUTTING FORCEPS) IMPLANT
FORCEPS BIOP RJ4 240 W/NDL (MISCELLANEOUS)
FORCEPS ESCP3.2XJMB 240X2.8X (MISCELLANEOUS) IMPLANT
GOWN CVR UNV OPN BCK APRN NK (MISCELLANEOUS) ×2 IMPLANT
GOWN ISOL THUMB LOOP REG UNIV (MISCELLANEOUS) ×2
INJECTOR VARIJECT VIN23 (MISCELLANEOUS) IMPLANT
KIT DEFENDO VALVE AND CONN (KITS) IMPLANT
KIT ENDO PROCEDURE OLY (KITS) ×2 IMPLANT
MANIFOLD NEPTUNE II (INSTRUMENTS) ×2 IMPLANT
MARKER SPOT ENDO TATTOO 5ML (MISCELLANEOUS) IMPLANT
PROBE APC STR FIRE (PROBE) IMPLANT
RETRIEVER NET ROTH 2.5X230 LF (MISCELLANEOUS) IMPLANT
SNARE SHORT THROW 13M SML OVAL (MISCELLANEOUS) IMPLANT
SNARE SHORT THROW 30M LRG OVAL (MISCELLANEOUS) IMPLANT
SNARE SNG USE RND 15MM (INSTRUMENTS) IMPLANT
SPOT EX ENDOSCOPIC TATTOO (MISCELLANEOUS)
TRAP ETRAP POLY (MISCELLANEOUS) IMPLANT
VARIJECT INJECTOR VIN23 (MISCELLANEOUS)
WATER STERILE IRR 250ML POUR (IV SOLUTION) ×2 IMPLANT

## 2020-04-09 NOTE — Telephone Encounter (Signed)
Called and spoke with pts daughter Renato Shin (she is emergency contact) and informed her that I appologize- I did read the first msg correctly however if her BP is sitting around 240 she needs to go to an ER or at least an urgent care to be evaluated.  She verbalized understanding and said she will take her.  CM

## 2020-04-09 NOTE — Telephone Encounter (Unsigned)
Copied from [place] [id number]. Topic: General - Other >> Apr 09, 2020 11:46 AM Oneta Rack wrote: Patient daughter Dr. Nicki Reaper  was advised by the [facility] to contact patient PCP to inform of the following. Patient unable to proceed with coloscopy due to patient top # being over 200 . Please follow up with daughter

## 2020-04-09 NOTE — Telephone Encounter (Signed)
Please schedule pt a blood pressure follow up for early next week to see Dr Ronnald Ramp.   CM

## 2020-04-09 NOTE — H&P (Signed)
Lucilla Lame, MD [address] [place], [place] [address] Phone:[phone] Fax : [phone]  Primary Care Physician:  Juline Patch, MD Primary Gastroenterologist:  Dr. Allen Norris  Pre-Procedure History & Physical: HPI:  Karina Leonard is a 79 y.o. female is here for an colonoscopy.   Past Medical History:  Diagnosis Date  . Allergy   . Cerebrovascular disease   . Depression   . Diabetes mellitus without complication (HCC)    (borderline)  . Hyperlipidemia   . Hypertension   . Sciatica   . Stroke ([place])   . Wears dentures    full upper and lower    Past Surgical History:  Procedure Laterality Date  . GALLBLADDER SURGERY  10/24/2007  . polyp removed     rectum- benign    Prior to Admission medications   Medication Sig Start Date End Date Taking? Authorizing Provider  clopidogrel (PLAVIX) 75 MG tablet Take 1 tablet (75 mg total) by mouth daily. 01/21/20  Yes Juline Patch, MD  cyanocobalamin (,VITAMIN B-12,) 1000 MCG/ML injection  02/27/20  Yes [provider]  donepezil (ARICEPT) 5 MG tablet  02/24/20  Yes [provider]  fexofenadine (ALLEGRA) 180 MG tablet Take 1 tablet (180 mg total) by mouth daily. otc 07/22/18  Yes Juline Patch, MD  losartan-hydrochlorothiazide (HYZAAR) 100-25 MG tablet Take 1 tablet by mouth daily. 01/21/20  Yes Juline Patch, MD  lovastatin (MEVACOR) 20 MG tablet TAKE 2 TABLETS (40 MG TOTAL) BY MOUTH DAILY. 01/21/20  Yes Juline Patch, MD  metoprolol succinate (TOPROL-XL) 100 MG 24 hr tablet Take 1 tablet (100 mg total) by mouth daily. 01/21/20  Yes Juline Patch, MD  venlafaxine XR (EFFEXOR-XR) 75 MG 24 hr capsule Take 1 capsule (75 mg total) by mouth daily. 01/21/20  Yes Juline Patch, MD  polyethylene glycol-electrolytes (NULYTELY) 420 g solution Drink one 8 oz glass every 20 mins until entire container is finished starting at 5:00pm on 04/08/20 04/02/20   Lucilla Lame, MD  Sodium Sulfate-Mag Sulfate-KCl (SUTAB)  [phone] MG TABS Take 376 mg by mouth as directed. 03/04/20   Lucilla Lame, MD    Allergies as of 03/04/2020 - Review Complete 03/04/2020  Allergen Reaction Noted  . Amoxicillin Hives 07/23/2015  . Penicillins  02/16/2015    Family History  Problem Relation Age of Onset  . Cancer Mother        uterine or ovarian. Unable to remember  . Cancer Father        lung  . Heart attack Sister     Social History   Socioeconomic History  . Marital status: Married    Spouse name: Not on file  . Number of children: 3  . Years of education: some college  . Highest education level: 12th grade  Occupational History  . Occupation: Retired  Tobacco Use  . Smoking status: Former Smoker    Packs/day: 1.00    Years: 20.00    Pack years: 20.00    Types: Cigarettes    Quit date: 61    Years since quitting: 54.4  . Smokeless tobacco: Never Used  Vaping Use  . Vaping Use: Never used  Substance and Sexual Activity  . Alcohol use: Yes    Alcohol/week: 0.0 standard drinks    Comment: special occasions  . Drug use: No  . Sexual activity: Never  Other Topics Concern  . Not on file  Social History Narrative  . Not on file  Social Determinants of Health   Financial Resource Strain: Low Risk   . Difficulty of Paying Living Expenses: Not hard at all  Food Insecurity: No Food Insecurity  . Worried About Charity fundraiser in the Last Year: Never true  . Ran Out of Food in the Last Year: Never true  Transportation Needs: No Transportation Needs  . Lack of Transportation (Medical): No  . Lack of Transportation (Non-Medical): No  Physical Activity: Sufficiently Active  . Days of Exercise per Week: 3 days  . Minutes of Exercise per Session: 60 min  Stress: No Stress Concern Present  . Feeling of Stress : Only a little  Social Connections: Unknown  . Frequency of Communication with Friends and Family: Patient refused  . Frequency of Social Gatherings with Friends and Family: Patient  refused  . Attends Religious Services: Patient refused  . Active Member of Clubs or Organizations: Patient refused  . Attends Archivist Meetings: Patient refused  . Marital Status: Married  Human resources officer Violence: Not At Risk  . Fear of Current or Ex-Partner: No  . Emotionally Abused: No  . Physically Abused: No  . Sexually Abused: No    Review of Systems: See HPI, otherwise negative ROS  Physical Exam: BP (!) 258/79   Pulse 61   Temp (!) 97.5 F (36.4 C) (Temporal)   Resp 16   Ht 5\' 7"  (1.702 m)   Wt 90.3 kg   SpO2 99%   BMI 31.17 kg/m  General:   Alert,  pleasant and cooperative in NAD Head:  Normocephalic and atraumatic. Neck:  Supple; no masses or thyromegaly. Lungs:  Clear throughout to auscultation.    Heart:  Regular rate and rhythm. Abdomen:  Soft, nontender and nondistended. Normal bowel sounds, without guarding, and without rebound.   Neurologic:  Alert and  oriented x4;  grossly normal neurologically.  Impression/Plan: [name] is here for an colonoscopy to be performed for history of adenomatous colon polyps 08/2014  Risks, benefits, limitations, and alternatives regarding  colonoscopy have been reviewed with the patient.  Questions have been answered.  All parties agreeable.   Lucilla Lame, MD  04/09/2020, 10:03 AM

## 2020-04-09 NOTE — Anesthesia Preprocedure Evaluation (Addendum)
Anesthesia Evaluation    Airway        Dental   Pulmonary former smoker,           Cardiovascular hypertension,      Neuro/Psych Cerebral microvascular disease, on Plavix. Prior CVA with no residuals CVA    GI/Hepatic   Endo/Other  diabetes  Renal/GU      Musculoskeletal   Abdominal   Peds  Hematology   Anesthesia Other Findings   Reproductive/Obstetrics                             Anesthesia Physical Anesthesia Plan  ASA:   Anesthesia Plan:    Post-op Pain Management:    Induction:   PONV Risk Score and Plan:   Airway Management Planned:   Additional Equipment:   Intra-op Plan:   Post-operative Plan:   Informed Consent:   Plan Discussed with:   Anesthesia Plan Comments: (Patient's BP upon arrival in the 076A systolic. She endorsed taking her metoprolol this morning, but not her Hyzaar (per our routine pre-op instructions). Because she had successfully completed her colon prep, we attempted to normalize her BP with 10mg  of IV hydralazine. When that did not cause any decrease in her BP I had the patient take her normal home dose of Hyzaar as well as administered another dose of 10mg  IV hydralazine. After an hour since the second dose of hydralazine the patient's BP was still in the 263F systolic. She denied any headache, visual changes, chest pains, or other concerning symptoms. After discussion with the patient as well as Dr. Allen Norris, the decision was made to cancel the elective procedure. I told her that when her colonoscopy is rescheduled she should take her Hyzaar the morning of the procedure so that this situation does not happen again. )        Anesthesia Quick Evaluation

## 2020-04-09 NOTE — Progress Notes (Signed)
Daughter updated that unfortunately we had to cancel the procedure today due to her mother's elevated BP. Pt requested that anesthesia or Dr. Allen Norris communicate with pt's primary, Dr. Ronnald Ramp, regarding patient's blood pressure, this was relayed to Dr. Wynelle Cleveland.

## 2020-04-09 NOTE — Progress Notes (Signed)
Called patient's daughter with update regarding patient's elevated blood pressure in preop. Will call back and let her know if we will proceed with the procedure or not.

## 2020-04-09 NOTE — ED Triage Notes (Signed)
Patient states that she was scheduled to have a colonoscopy done today but could not get her blood pressure down below 242/60. Patient states that she is still going to the bathroom frequently due to colonoscopy prep. Patient states that she called her PCP and was told to come here to have her blood pressure checked.

## 2020-04-11 NOTE — ED Provider Notes (Signed)
[facility]    CSN: [id number] Arrival date & time: 04/09/20  1623      History   Chief Complaint Chief Complaint  Patient presents with   Hypertension    HPI SEE BEHARRY is a 79 y.o. female.   Patient was scheduled to have a GI procedure today.  Patient's blood pressure was found to be over 200/100.  Procedure was canceled and patient was sent to the urgent care for evaluation of elevated blood pressure.  Patient was told to hold her morning losartan and hydrochlorothiazide.  Patient's daughter is with patient she reports patient has a high salt diet and does not get very much exercise.  Patient does normally take her medications.  Denies any symptoms she does not have a headache she does not have any chest pain   Hypertension    Past Medical History:  Diagnosis Date   Allergy    Cerebrovascular disease    Depression    Diabetes mellitus without complication ([place])    (borderline)   Hyperlipidemia    Hypertension    Sciatica    Stroke [facility])    Wears dentures    full upper and lower    Patient Active Problem List   Diagnosis Date Noted   Need for vaccination 03/18/2019   H/O hypercholesterolemia 03/18/2019   Drug-induced obesity 03/18/2019   Coronary artery disease 03/18/2019   Cataract cortical, senile 03/18/2019   Taking medication for chronic disease 03/18/2019   Numbness and tingling of right upper extremity 08/15/2016   Lipoma of right upper extremity 08/15/2016   Cerebral microvascular disease 07/23/2015   Familial multiple lipoprotein-type hyperlipidemia 02/16/2015   Anxiety attack 02/16/2015   Recurrent major depressive episodes ([place]) 02/16/2015   Essential (primary) hypertension 02/16/2015   H/O: HTN (hypertension) 02/16/2015   Asymptomatic hypertensive urgency 11/14/2014   Tubulovillous adenoma of rectum 10/21/2014   Depression 10/21/2014   Nuclear sclerosis of both eyes 07/07/2014    Past Surgical  History:  Procedure Laterality Date   GALLBLADDER SURGERY  10/24/2007   polyp removed     rectum- benign    OB History   No obstetric history on file.      Home Medications    Prior to Admission medications   Medication Sig Start Date End Date Taking? Authorizing Provider  clopidogrel (PLAVIX) 75 MG tablet Take 1 tablet (75 mg total) by mouth daily. 01/21/20  Yes Juline Patch, MD  cyanocobalamin (,VITAMIN B-12,) 1000 MCG/ML injection  02/27/20  Yes [provider]  donepezil (ARICEPT) 5 MG tablet  02/24/20  Yes [provider]  fexofenadine (ALLEGRA) 180 MG tablet Take 1 tablet (180 mg total) by mouth daily. otc 07/22/18  Yes Juline Patch, MD  losartan-hydrochlorothiazide (HYZAAR) 100-25 MG tablet Take 1 tablet by mouth daily. 01/21/20  Yes Juline Patch, MD  lovastatin (MEVACOR) 20 MG tablet TAKE 2 TABLETS (40 MG TOTAL) BY MOUTH DAILY. 01/21/20  Yes Juline Patch, MD  metoprolol succinate (TOPROL-XL) 100 MG 24 hr tablet Take 1 tablet (100 mg total) by mouth daily. 01/21/20  Yes Juline Patch, MD  polyethylene glycol-electrolytes (NULYTELY) 420 g solution Drink one 8 oz glass every 20 mins until entire container is finished starting at 5:00pm on 04/08/20 04/02/20  Yes Lucilla Lame, MD  Sodium Sulfate-Mag Sulfate-KCl (SUTAB) [phone] MG TABS Take 376 mg by mouth as directed. 03/04/20  Yes Lucilla Lame, MD  venlafaxine XR (EFFEXOR-XR) 75 MG 24 hr capsule Take 1 capsule (  75 mg total) by mouth daily. 01/21/20  Yes Juline Patch, MD  cloNIDine (CATAPRES) 0.1 MG tablet Take 1 tablet (0.1 mg total) by mouth 2 (two) times daily. 04/09/20 04/09/21  Fransico Meadow, PA-C    Family History Family History  Problem Relation Age of Onset   Cancer Mother        uterine or ovarian. Unable to remember   Cancer Father        lung   Heart attack Sister     Social History Social History   Tobacco Use   Smoking status: Former Smoker    Packs/day: 1.00    Years: 20.00     Pack years: 20.00    Types: Cigarettes    Quit date: 73    Years since quitting: 54.5   Smokeless tobacco: Never Used  Scientific laboratory technician Use: Never used  Substance Use Topics   Alcohol use: Yes    Alcohol/week: 0.0 standard drinks    Comment: special occasions   Drug use: No     Allergies   Amoxicillin and Penicillins   Review of Systems Review of Systems  All other systems reviewed and are negative.    Physical Exam Triage Vital Signs ED Triage Vitals  Enc Vitals Group     BP 04/09/20 1729 (!) 210/96     Pulse Rate 04/09/20 1729 86     Resp 04/09/20 1718 (P) 16     Temp 04/09/20 1718 (P) 98.3 F (36.8 C)     Temp Source 04/09/20 1718 (P) Oral     SpO2 04/09/20 1718 (P) 97 %     Weight 04/09/20 1716 199 lb 1.2 oz (90.3 kg)     Height 04/09/20 1716 5\' 7"  (1.702 m)     Head Circumference --      Peak Flow --      Pain Score 04/09/20 1715 0     Pain Loc --      Pain Edu? --      Excl. in [place]? --    No data found.  Updated Vital Signs BP (!) 178/78 (BP Location: Left Arm)    Pulse 86    Temp 98.3 F (36.8 C) (Oral)    Resp 17    Ht 5\' 7"  (1.702 m)    Wt 90.3 kg    SpO2 97%    BMI 31.18 kg/m   Visual Acuity Right Eye Distance:   Left Eye Distance:   Bilateral Distance:    Right Eye Near:   Left Eye Near:    Bilateral Near:     Physical Exam Vitals and nursing note reviewed.  Constitutional:      Appearance: She is well-developed.  HENT:     Head: Normocephalic.  Cardiovascular:     Rate and Rhythm: Normal rate and regular rhythm.  Pulmonary:     Effort: Pulmonary effort is normal.  Abdominal:     General: There is no distension.  Musculoskeletal:        General: Normal range of motion.     Cervical back: Normal range of motion.  Skin:    General: Skin is warm.  Neurological:     Mental Status: She is alert and oriented to person, place, and time.  Psychiatric:        Mood and Affect: Mood normal.      UC Treatments / Results    Labs (all labs ordered are listed, but only abnormal results are  displayed) Labs Reviewed - No data to display  EKG   Radiology No results found.  Procedures Procedures (including critical care time)  Medications Ordered in UC Medications  cloNIDine (CATAPRES) tablet 0.1 mg (0.1 mg Oral Given 04/09/20 1801)    Initial Impression / Assessment and Plan / UC Course  I have reviewed the triage vital signs and the nursing notes.  Pertinent labs & imaging results that were available during my care of the patient were reviewed by me and considered in my medical decision making (see chart for details).     MDM: Patient given clonidine 0.1 mg.  Patient's blood pressure is rechecked and was 178/78.  Patient's daughter plans to help patient with getting low salt meals.  Encouraged exercise patient is to follow-up with her primary care physician for recheck of blood pressure. Final Clinical Impressions(s) / UC Diagnoses   Final diagnoses:  Hypertension, unspecified type   Discharge Instructions   None    ED Prescriptions    Medication Sig Dispense Auth. Provider   cloNIDine (CATAPRES) 0.1 MG tablet Take 1 tablet (0.1 mg total) by mouth 2 (two) times daily. 60 tablet Fransico Meadow, [place]     PDMP not reviewed this encounter.  An After Visit Summary was printed and given to the patient.    Fransico Meadow, [place] 04/11/20 1257

## 2020-04-12 ENCOUNTER — Ambulatory Visit (INDEPENDENT_AMBULATORY_CARE_PROVIDER_SITE_OTHER): Payer: Medicare HMO | Admitting: Family Medicine

## 2020-04-12 ENCOUNTER — Encounter: Payer: Self-pay | Admitting: Family Medicine

## 2020-04-12 ENCOUNTER — Telehealth: Payer: Self-pay | Admitting: Family Medicine

## 2020-04-12 ENCOUNTER — Other Ambulatory Visit: Payer: Self-pay

## 2020-04-12 VITALS — BP 250/80 | HR 52 | Ht 67.0 in | Wt 199.0 lb

## 2020-04-12 DIAGNOSIS — I10 Essential (primary) hypertension: Secondary | ICD-10-CM

## 2020-04-12 DIAGNOSIS — K649 Unspecified hemorrhoids: Secondary | ICD-10-CM

## 2020-04-12 MED ORDER — CLONIDINE 0.1 MG/24HR TD PTWK: 0.1000 mg | MEDICATED_PATCH | TRANSDERMAL | 12 refills | Status: DC

## 2020-04-12 MED ORDER — HYDROCORTISONE (PERIANAL) 2.5 % EX CREA: 1.0000 "application " | TOPICAL_CREAM | Freq: Two times a day (BID) | CUTANEOUS | 0 refills | Status: DC

## 2020-04-12 NOTE — Progress Notes (Signed)
Date:  04/12/2020   Name:  Karina Leonard   DOB:  September 01, 1941   MRN:  [id number]   Chief Complaint: Hypertension (BP check, stop taking clonidine bp started taking on the 18th and 19th X2  dropped the 20th think its because she was taking to much bp meds, was not able to do colonoscopy because of bp,)  Hypertension This is a chronic problem. The current episode started more than 1 year ago. The problem has been waxing and waning since onset. The problem is uncontrolled. Pertinent negatives include no anxiety, blurred vision, chest pain, headaches, malaise/fatigue, neck pain, orthopnea, palpitations, peripheral edema, PND, shortness of breath or sweats. There are no associated agents to hypertension. Risk factors for coronary artery disease include dyslipidemia. Past treatments include angiotensin blockers, diuretics and beta blockers. The current treatment provides moderate improvement. There are no compliance problems.  There is no history of angina, kidney disease, CAD/MI, CVA, heart failure, left ventricular hypertrophy, PVD or retinopathy. There is no history of chronic renal disease, a hypertension causing med or renovascular disease.    Lab Results  Component Value Date   CREATININE 1.05 (H) 01/22/2020   BUN 17 01/22/2020   NA 144 01/22/2020   K 4.1 01/22/2020   CL 103 01/22/2020   CO2 26 01/22/2020   Lab Results  Component Value Date   CHOL 172 01/22/2020   HDL 60 01/22/2020   LDLCALC 101 (H) 01/22/2020   TRIG 56 01/22/2020   CHOLHDL 3.0 03/18/2019   No results found for: TSH Lab Results  Component Value Date   HGBA1C 6.0 (H) 03/18/2020   Lab Results  Component Value Date   WBC 10.8 01/22/2020   HGB 14.2 01/22/2020   HCT 41.1 01/22/2020   MCV 92 01/22/2020   PLT 243 01/22/2020   Lab Results  Component Value Date   ALT 6 07/28/2019   AST 18 07/28/2019   ALKPHOS 100 07/28/2019   BILITOT 0.4 07/28/2019     Review of Systems  Constitutional: Negative.   Negative for chills, fatigue, fever, malaise/fatigue and unexpected weight change.  HENT: Negative for congestion, ear discharge, ear pain, rhinorrhea, sinus pressure, sneezing and sore throat.   Eyes: Negative for blurred vision, photophobia, pain, discharge, redness and itching.  Respiratory: Negative for cough, shortness of breath, wheezing and stridor.   Cardiovascular: Negative for chest pain, palpitations, orthopnea and PND.  Gastrointestinal: Negative for abdominal pain, blood in stool, constipation, diarrhea, nausea and vomiting.  Endocrine: Negative for cold intolerance, heat intolerance, polydipsia, polyphagia and polyuria.  Genitourinary: Negative for dysuria, flank pain, frequency, hematuria, menstrual problem, pelvic pain, urgency, vaginal bleeding and vaginal discharge.  Musculoskeletal: Negative for arthralgias, back pain, myalgias and neck pain.  Skin: Negative for rash.  Allergic/Immunologic: Negative for environmental allergies and food allergies.  Neurological: Negative for dizziness, weakness, light-headedness, numbness and headaches.  Hematological: Negative for adenopathy. Does not bruise/bleed easily.  Psychiatric/Behavioral: Negative for dysphoric mood. The patient is not nervous/anxious.     Patient Active Problem List   Diagnosis Date Noted  . Need for vaccination 03/18/2019  . H/O hypercholesterolemia 03/18/2019  . Drug-induced obesity 03/18/2019  . Coronary artery disease 03/18/2019  . Cataract cortical, senile 03/18/2019  . Taking medication for chronic disease 03/18/2019  . Numbness and tingling of right upper extremity 08/15/2016  . Lipoma of right upper extremity 08/15/2016  . Cerebral microvascular disease 07/23/2015  . Familial multiple lipoprotein-type hyperlipidemia 02/16/2015  . Anxiety attack 02/16/2015  . Recurrent  major depressive episodes ([place]) 02/16/2015  . Essential (primary) hypertension 02/16/2015  . H/O: HTN (hypertension) 02/16/2015  .  Asymptomatic hypertensive urgency 11/14/2014  . Tubulovillous adenoma of rectum 10/21/2014  . Depression 10/21/2014  . Nuclear sclerosis of both eyes 07/07/2014    Allergies  Allergen Reactions  . Amoxicillin Hives  . Penicillins Hives    Past Surgical History:  Procedure Laterality Date  . GALLBLADDER SURGERY  10/24/2007  . polyp removed     rectum- benign    Social History   Tobacco Use  . Smoking status: Former Smoker    Packs/day: 1.00    Years: 20.00    Pack years: 20.00    Types: Cigarettes    Quit date: 45    Years since quitting: 54.5  . Smokeless tobacco: Never Used  Vaping Use  . Vaping Use: Never used  Substance Use Topics  . Alcohol use: Yes    Alcohol/week: 0.0 standard drinks    Comment: special occasions  . Drug use: No     Medication list has been reviewed and updated.  Current Meds  Medication Sig  . clopidogrel (PLAVIX) 75 MG tablet Take 1 tablet (75 mg total) by mouth daily.  . cyanocobalamin (,VITAMIN B-12,) 1000 MCG/ML injection   . donepezil (ARICEPT) 5 MG tablet   . fexofenadine (ALLEGRA) 180 MG tablet Take 1 tablet (180 mg total) by mouth daily. otc  . losartan-hydrochlorothiazide (HYZAAR) 100-25 MG tablet Take 1 tablet by mouth daily.  Marland Kitchen lovastatin (MEVACOR) 20 MG tablet TAKE 2 TABLETS (40 MG TOTAL) BY MOUTH DAILY.  . metoprolol succinate (TOPROL-XL) 100 MG 24 hr tablet Take 1 tablet (100 mg total) by mouth daily.  Marland Kitchen venlafaxine XR (EFFEXOR-XR) 75 MG 24 hr capsule Take 1 capsule (75 mg total) by mouth daily.    PHQ 2/9 Scores 04/12/2020 03/18/2020 03/08/2020 02/04/2020  PHQ - 2 Score 0 0 0 0  PHQ- 9 Score 1 0 0 -    GAD 7 : Generalized Anxiety Score 04/12/2020 03/18/2020 03/08/2020 01/21/2020  Nervous, Anxious, on Edge 0 0 0 1  Control/stop worrying 0 0 0 0  Worry too much - different things 0 0 0 0  Trouble relaxing 0 0 0 0  Restless 0 0 0 0  Easily annoyed or irritable 0 0 0 1  Afraid - awful might happen 0 0 0 0  Total GAD 7 Score  0 0 0 2  Anxiety Difficulty Not difficult at all - Not difficult at all Not difficult at all    BP Readings from Last 3 Encounters:  04/12/20 (!) 250/80  04/09/20 (!) 178/78  04/09/20 (!) 242/60    Physical Exam Vitals and nursing note reviewed.  Constitutional:      Appearance: She is well-developed.  HENT:     Head: Normocephalic.     Right Ear: Tympanic membrane, ear canal and external ear normal.     Left Ear: Tympanic membrane, ear canal and external ear normal.     Nose: Nose normal. No congestion or rhinorrhea.     Mouth/Throat:     Mouth: Mucous membranes are moist.  Eyes:     General: Lids are everted, no foreign bodies appreciated. No scleral icterus.       Left eye: No foreign body or hordeolum.     Conjunctiva/sclera: Conjunctivae normal.     Right eye: Right conjunctiva is not injected.     Left eye: Left conjunctiva is not injected.  Pupils: Pupils are equal, round, and reactive to light.  Neck:     Thyroid: No thyromegaly.     Vascular: No carotid bruit or JVD.     Trachea: No tracheal deviation.  Cardiovascular:     Rate and Rhythm: Normal rate and regular rhythm.     Pulses: Normal pulses.     Heart sounds: Normal heart sounds. No murmur heard.  No friction rub. No gallop.   Pulmonary:     Effort: Pulmonary effort is normal. No respiratory distress.     Breath sounds: Normal breath sounds. No wheezing, rhonchi or rales.  Abdominal:     General: Bowel sounds are normal.     Palpations: Abdomen is soft. There is no mass.     Tenderness: There is no abdominal tenderness. There is no guarding or rebound.  Musculoskeletal:        General: No tenderness. Normal range of motion.     Cervical back: Normal range of motion and neck supple. No rigidity or tenderness.  Lymphadenopathy:     Cervical: No cervical adenopathy.  Skin:    General: Skin is warm.     Coloration: Skin is not jaundiced or pale.     Findings: No rash.  Neurological:     Mental  Status: She is alert and oriented to person, place, and time.     Cranial Nerves: No cranial nerve deficit.     Deep Tendon Reflexes: Reflexes normal.  Psychiatric:        Mood and Affect: Mood is not anxious or depressed.     Wt Readings from Last 3 Encounters:  04/12/20 199 lb (90.3 kg)  04/09/20 199 lb 1.2 oz (90.3 kg)  04/09/20 199 lb (90.3 kg)    BP (!) 250/80   Pulse (!) 52   Ht 5\' 7"  (1.702 m)   Wt 199 lb (90.3 kg)   SpO2 96%   BMI 31.17 kg/m   Assessment and Plan: 1. Essential hypertension Chronic.  Uncontrolled.  Asymptomatic stable.  Patient is not having any headache nor chest pain or CHF symptoms.  It is uncertain whether patient has taken her clonidine thinking that it may have contributed to a syncopal episode she had on Sunday.  However the syncopal episode was not associated with her taking her Klonopin that she did not take on Sunday morning.  Patient is somewhat uncertain about her medications for which I have concerns about her mentation.  On review of her neurology evaluation and her memory I have concerns that patient has short-term memory loss.  Review of patient's MRI notes no acute circumstance with multiple old small vessel infarcts of the right basal ganglia really right thalamus and pons there is also some periventricular and white matter signal is commonly associated with chronic ischemic microangiopathy there is multiple chronic microhemorrhages in the predominantly central distribution.  It is imperative that we control her blood pressure so I think that we do need to be on something of the matter of Catapres but maybe she does not tolerate the dosing affect of the medication in pill form.  We will initiate a patch there is a 0.1/week on the arm and recheck her blood pressure tomorrow to see if it is sufficient. - cloNIDine (CATAPRES - DOSED IN MG/24 HR) 0.1 mg/24hr patch; Place 1 patch (0.1 mg total) onto the skin once a week.  Dispense: 4 patch; Refill: 12 -  Ambulatory referral to Cardiology  2. Hemorrhoids, unspecified hemorrhoid type Following preparation  for colonoscopy which she was unable to get secondary to elevated blood pressure patient has a perianal irritation.  We will treat with Anusol HC 2.5 cream. - hydrocortisone (ANUSOL-HC) 2.5 % rectal cream; Place 1 application rectally 2 (two) times daily.  Dispense: 30 g; Refill: 0  Patient was just brought up here after hours at 6 PM because she thought she left her pocketbook here but we have not had any pocketbook turned in at the front or was noted in the room.  On questioning patient had her keys with her but when asked if she had used her phone she remembers using her phone on the bench to call her husband.  Therefore she had to have her pocketbook which has a phone in it when she left the office and is somewhere between the hall to the car to [place] patient has misplaced her belongings.  Patient was heading to the car and the person accompanying her will call to see if there is a phone ringing in the car.  Have the patient daughter will come and accompany her because I have concerns about her memory and whether or not we need to reevaluate at the neurologic level.

## 2020-04-12 NOTE — Telephone Encounter (Unsigned)
Copied from [place] [id number]. Topic: General - Inquiry >> Apr 12, 2020  3:59 PM Alease Frame wrote: Reason for CRM: Pts daughter is returning call from [place] . Please advise

## 2020-04-12 NOTE — Telephone Encounter (Signed)
Spoke to daughter Vernell Barrier

## 2020-04-13 ENCOUNTER — Ambulatory Visit (INDEPENDENT_AMBULATORY_CARE_PROVIDER_SITE_OTHER): Payer: Medicare HMO | Admitting: Family Medicine

## 2020-04-13 ENCOUNTER — Encounter: Payer: Self-pay | Admitting: Family Medicine

## 2020-04-13 VITALS — BP 210/90 | HR 64 | Ht 67.0 in | Wt 199.0 lb

## 2020-04-13 DIAGNOSIS — I10 Essential (primary) hypertension: Secondary | ICD-10-CM

## 2020-04-13 NOTE — Progress Notes (Signed)
Date:  04/13/2020   Name:  Karina Leonard   DOB:  1941/08/26   MRN:  [id number]   Chief Complaint: Follow-up (b/p check after starting patches)  Hypertension This is a chronic problem. The current episode started more than 1 year ago. The problem has been waxing and waning since onset. Pertinent negatives include no chest pain, palpitations or shortness of breath. Past treatments include central alpha agonists, angiotensin blockers, diuretics and beta blockers. The current treatment provides no improvement.    Lab Results  Component Value Date   CREATININE 1.05 (H) 01/22/2020   BUN 17 01/22/2020   NA 144 01/22/2020   K 4.1 01/22/2020   CL 103 01/22/2020   CO2 26 01/22/2020   Lab Results  Component Value Date   CHOL 172 01/22/2020   HDL 60 01/22/2020   LDLCALC 101 (H) 01/22/2020   TRIG 56 01/22/2020   CHOLHDL 3.0 03/18/2019   No results found for: TSH Lab Results  Component Value Date   HGBA1C 6.0 (H) 03/18/2020   Lab Results  Component Value Date   WBC 10.8 01/22/2020   HGB 14.2 01/22/2020   HCT 41.1 01/22/2020   MCV 92 01/22/2020   PLT 243 01/22/2020   Lab Results  Component Value Date   ALT 6 07/28/2019   AST 18 07/28/2019   ALKPHOS 100 07/28/2019   BILITOT 0.4 07/28/2019     Review of Systems  Respiratory: Negative for shortness of breath.   Cardiovascular: Negative for chest pain and palpitations.    Patient Active Problem List   Diagnosis Date Noted  . Need for vaccination 03/18/2019  . H/O hypercholesterolemia 03/18/2019  . Drug-induced obesity 03/18/2019  . Coronary artery disease 03/18/2019  . Cataract cortical, senile 03/18/2019  . Taking medication for chronic disease 03/18/2019  . Numbness and tingling of right upper extremity 08/15/2016  . Lipoma of right upper extremity 08/15/2016  . Cerebral microvascular disease 07/23/2015  . Familial multiple lipoprotein-type hyperlipidemia 02/16/2015  . Anxiety attack 02/16/2015  . Recurrent  major depressive episodes ([place]) 02/16/2015  . Essential (primary) hypertension 02/16/2015  . H/O: HTN (hypertension) 02/16/2015  . Asymptomatic hypertensive urgency 11/14/2014  . Tubulovillous adenoma of rectum 10/21/2014  . Depression 10/21/2014  . Nuclear sclerosis of both eyes 07/07/2014    Allergies  Allergen Reactions  . Amoxicillin Hives  . Penicillins Hives    Past Surgical History:  Procedure Laterality Date  . GALLBLADDER SURGERY  10/24/2007  . polyp removed     rectum- benign    Social History   Tobacco Use  . Smoking status: Former Smoker    Packs/day: 1.00    Years: 20.00    Pack years: 20.00    Types: Cigarettes    Quit date: 69    Years since quitting: 54.5  . Smokeless tobacco: Never Used  Vaping Use  . Vaping Use: Never used  Substance Use Topics  . Alcohol use: Yes    Alcohol/week: 0.0 standard drinks    Comment: special occasions  . Drug use: No     Medication list has been reviewed and updated.  Current Meds  Medication Sig  . cloNIDine (CATAPRES - DOSED IN MG/24 HR) 0.1 mg/24hr patch Place 1 patch (0.1 mg total) onto the skin once a week.  . clopidogrel (PLAVIX) 75 MG tablet Take 1 tablet (75 mg total) by mouth daily.  . cyanocobalamin (,VITAMIN B-12,) 1000 MCG/ML injection   . donepezil (ARICEPT) 5 MG tablet   .  fexofenadine (ALLEGRA) 180 MG tablet Take 1 tablet (180 mg total) by mouth daily. otc  . hydrocortisone (ANUSOL-HC) 2.5 % rectal cream Place 1 application rectally 2 (two) times daily.  Marland Kitchen losartan-hydrochlorothiazide (HYZAAR) 100-25 MG tablet Take 1 tablet by mouth daily.  Marland Kitchen lovastatin (MEVACOR) 20 MG tablet TAKE 2 TABLETS (40 MG TOTAL) BY MOUTH DAILY.  . metoprolol succinate (TOPROL-XL) 100 MG 24 hr tablet Take 1 tablet (100 mg total) by mouth daily.  Marland Kitchen venlafaxine XR (EFFEXOR-XR) 75 MG 24 hr capsule Take 1 capsule (75 mg total) by mouth daily.    PHQ 2/9 Scores 04/12/2020 03/18/2020 03/08/2020 02/04/2020  PHQ - 2 Score 0 0 0 0    PHQ- 9 Score 1 0 0 -    GAD 7 : Generalized Anxiety Score 04/12/2020 03/18/2020 03/08/2020 01/21/2020  Nervous, Anxious, on Edge 0 0 0 1  Control/stop worrying 0 0 0 0  Worry too much - different things 0 0 0 0  Trouble relaxing 0 0 0 0  Restless 0 0 0 0  Easily annoyed or irritable 0 0 0 1  Afraid - awful might happen 0 0 0 0  Total GAD 7 Score 0 0 0 2  Anxiety Difficulty Not difficult at all - Not difficult at all Not difficult at all    BP Readings from Last 3 Encounters:  04/13/20 (!) 210/90  04/12/20 (!) 250/80  04/09/20 (!) 178/78    Physical Exam Vitals and nursing note reviewed.     Wt Readings from Last 3 Encounters:  04/13/20 199 lb (90.3 kg)  04/12/20 199 lb (90.3 kg)  04/09/20 199 lb 1.2 oz (90.3 kg)    BP (!) 210/90   Pulse 64   Ht 5\' 7"  (1.702 m)   Wt 199 lb (90.3 kg)   BMI 31.17 kg/m   Assessment and Plan:   1. Essential hypertension Chronic.  Persistent.  Uncontrolled.  Patient returns for nurses visit to assess that she is wearing her clonidine patch 1 that she is tolerating her clonidine to she feels very well on it.  And she is having a decrease in her blood pressure but is not noted to be decreased at this point.  She might be getting accustomed to the medication this may need to be elevated or a different medication use.  Patient is to see Dr. Clayborn Bigness tomorrow for evaluation and possibly an adjustment with either dosing and or addition of another medication perhaps hydralazine.

## 2020-04-14 DIAGNOSIS — R7303 Prediabetes: Secondary | ICD-10-CM | POA: Diagnosis not present

## 2020-04-14 DIAGNOSIS — G3184 Mild cognitive impairment, so stated: Secondary | ICD-10-CM | POA: Diagnosis not present

## 2020-04-14 DIAGNOSIS — I639 Cerebral infarction, unspecified: Secondary | ICD-10-CM | POA: Diagnosis not present

## 2020-04-14 DIAGNOSIS — R600 Localized edema: Secondary | ICD-10-CM | POA: Diagnosis not present

## 2020-04-14 DIAGNOSIS — I2510 Atherosclerotic heart disease of native coronary artery without angina pectoris: Secondary | ICD-10-CM | POA: Diagnosis not present

## 2020-04-14 DIAGNOSIS — R0600 Dyspnea, unspecified: Secondary | ICD-10-CM | POA: Diagnosis not present

## 2020-04-14 DIAGNOSIS — I10 Essential (primary) hypertension: Secondary | ICD-10-CM | POA: Diagnosis not present

## 2020-04-14 DIAGNOSIS — E6609 Other obesity due to excess calories: Secondary | ICD-10-CM | POA: Diagnosis not present

## 2020-04-14 DIAGNOSIS — R001 Bradycardia, unspecified: Secondary | ICD-10-CM | POA: Diagnosis not present

## 2020-05-11 ENCOUNTER — Telehealth: Payer: Self-pay | Admitting: Family Medicine

## 2020-05-11 NOTE — Telephone Encounter (Signed)
Copied from [place] [id number]. Topic: General - Other >> May 11, 2020  4:22 PM Gillis Ends D wrote: Reason for CRM: Currently seeing Dr. Jerrye Beavers and Dr. Ronnald Ramp has set her up to see another cardiologist and she was wondering if she still needs to see this new Dr. If she is already seeing Dr. Clayborn Bigness. Please contact the patient.

## 2020-05-12 NOTE — Telephone Encounter (Signed)
Spoke to pt concerning her visit to [facility]/ Dr Clayborn Bigness. She did not remember going, at first, but then was able to tell me that they ordered some tests on her.

## 2020-05-13 ENCOUNTER — Ambulatory Visit: Payer: Medicare HMO | Admitting: Cardiology

## 2020-05-14 ENCOUNTER — Ambulatory Visit: Payer: Medicare HMO | Admitting: Cardiology

## 2020-05-17 ENCOUNTER — Encounter: Payer: Self-pay | Admitting: Cardiology

## 2020-05-24 DIAGNOSIS — I639 Cerebral infarction, unspecified: Secondary | ICD-10-CM | POA: Diagnosis not present

## 2020-05-24 DIAGNOSIS — R0600 Dyspnea, unspecified: Secondary | ICD-10-CM | POA: Diagnosis not present

## 2020-05-31 DIAGNOSIS — I10 Essential (primary) hypertension: Secondary | ICD-10-CM | POA: Diagnosis not present

## 2020-05-31 DIAGNOSIS — Z6831 Body mass index (BMI) 31.0-31.9, adult: Secondary | ICD-10-CM | POA: Diagnosis not present

## 2020-05-31 DIAGNOSIS — R7303 Prediabetes: Secondary | ICD-10-CM | POA: Diagnosis not present

## 2020-05-31 DIAGNOSIS — E6609 Other obesity due to excess calories: Secondary | ICD-10-CM | POA: Diagnosis not present

## 2020-05-31 DIAGNOSIS — G3184 Mild cognitive impairment, so stated: Secondary | ICD-10-CM | POA: Diagnosis not present

## 2020-05-31 DIAGNOSIS — R001 Bradycardia, unspecified: Secondary | ICD-10-CM | POA: Diagnosis not present

## 2020-05-31 DIAGNOSIS — R0602 Shortness of breath: Secondary | ICD-10-CM | POA: Diagnosis not present

## 2020-05-31 DIAGNOSIS — I2510 Atherosclerotic heart disease of native coronary artery without angina pectoris: Secondary | ICD-10-CM | POA: Diagnosis not present

## 2020-05-31 DIAGNOSIS — Z8673 Personal history of transient ischemic attack (TIA), and cerebral infarction without residual deficits: Secondary | ICD-10-CM | POA: Diagnosis not present

## 2020-06-04 ENCOUNTER — Telehealth: Payer: Self-pay | Admitting: Family Medicine

## 2020-06-04 DIAGNOSIS — I10 Essential (primary) hypertension: Secondary | ICD-10-CM

## 2020-06-04 NOTE — Telephone Encounter (Signed)
Medication Refill - Medication: clonidine   Has the patient contacted their pharmacy? Yes.   (Agent: If no, request that the patient contact the pharmacy for the refill.) (Agent: If yes, when and what did the pharmacy advise?)  Preferred Pharmacy (with phone number or street name):  [facility], [address]  [place] [address]  Phone: [phone] Fax: [phone]  Hours: Not open 24 hours     Agent: Please be advised that RX refills may take up to 3 business days. We ask that you follow-up with your pharmacy.

## 2020-06-04 NOTE — Telephone Encounter (Signed)
Requested medication (s) are due for refill today:No  Requested medication (s) are on the active medication list: Yes  Last refill:  04/12/20  Future visit scheduled: Yes  Notes to clinic:  Pt. Wants medication sent to her [facility] mail order pharmacy. Left her message to see if her local pharmacy can transfer this. Unsure they are able to do this. Thanks.    Requested Prescriptions  Pending Prescriptions Disp Refills   cloNIDine (CATAPRES - DOSED IN MG/24 HR) 0.1 mg/24hr patch 4 patch 12    Sig: Place 1 patch (0.1 mg total) onto the skin once a week.      Cardiovascular:  Alpha-2 Agonists Failed - 06/04/2020  2:03 PM      Failed - Last BP in normal range    BP Readings from Last 1 Encounters:  04/13/20 (!) 210/90          Passed - Last Heart Rate in normal range    Pulse Readings from Last 1 Encounters:  04/13/20 64          Passed - Valid encounter within last 6 months    Recent Outpatient Visits           1 month ago Essential hypertension   [place], [place]   1 month ago Essential hypertension   [place], MD   2 months ago Hyperglycemia   [facility] Juline Patch, MD   2 months ago Essential (primary) hypertension   [facility] Juline Patch, MD   4 months ago Essential (primary) hypertension   [facility] Juline Patch, MD       Future Appointments             In 1 month Juline Patch, MD [facility], [facility]

## 2020-06-07 NOTE — Telephone Encounter (Signed)
[facility] pharmacy checking on the status of 90 day supply request for cloNIDine (CATAPRES - DOSED IN MG/24 HR) 0.1 mg/24hr patch    [place], [place] Phone:  [phone]  Fax:  [phone]

## 2020-06-09 ENCOUNTER — Other Ambulatory Visit: Payer: Self-pay

## 2020-06-09 DIAGNOSIS — I10 Essential (primary) hypertension: Secondary | ICD-10-CM

## 2020-06-09 MED ORDER — CLONIDINE 0.1 MG/24HR TD PTWK: 0.1000 mg | MEDICATED_PATCH | TRANSDERMAL | 1 refills | Status: DC

## 2020-06-09 NOTE — Telephone Encounter (Signed)
Spoke with patient. She said [facility] is cheaper per her insurance for the patches. Sent in to [facility] for the patient instead of getting them from [place].   CM

## 2020-07-22 ENCOUNTER — Ambulatory Visit (INDEPENDENT_AMBULATORY_CARE_PROVIDER_SITE_OTHER): Payer: Medicare HMO | Admitting: Family Medicine

## 2020-07-22 ENCOUNTER — Other Ambulatory Visit: Payer: Self-pay

## 2020-07-22 ENCOUNTER — Encounter: Payer: Self-pay | Admitting: Family Medicine

## 2020-07-22 VITALS — BP 130/80 | HR 60 | Ht 67.0 in | Wt 202.0 lb

## 2020-07-22 DIAGNOSIS — Z23 Encounter for immunization: Secondary | ICD-10-CM | POA: Diagnosis not present

## 2020-07-22 DIAGNOSIS — E7849 Other hyperlipidemia: Secondary | ICD-10-CM

## 2020-07-22 DIAGNOSIS — I10 Essential (primary) hypertension: Secondary | ICD-10-CM

## 2020-07-22 DIAGNOSIS — I6789 Other cerebrovascular disease: Secondary | ICD-10-CM | POA: Diagnosis not present

## 2020-07-22 DIAGNOSIS — F339 Major depressive disorder, recurrent, unspecified: Secondary | ICD-10-CM

## 2020-07-22 MED ORDER — VENLAFAXINE HCL ER 75 MG PO CP24: 75.0000 mg | ORAL_CAPSULE | Freq: Every day | ORAL | 1 refills | Status: DC

## 2020-07-22 MED ORDER — LOVASTATIN 20 MG PO TABS: ORAL_TABLET | ORAL | 1 refills | Status: DC

## 2020-07-22 MED ORDER — CLOPIDOGREL BISULFATE 75 MG PO TABS: 75.0000 mg | ORAL_TABLET | Freq: Every day | ORAL | 1 refills | Status: DC

## 2020-07-22 NOTE — Progress Notes (Signed)
Date:  07/22/2020   Name:  Karina Leonard   DOB:  10/17/1941   MRN:  [id number]   Chief Complaint: Hyperlipidemia, Cerebrovascular Accident, and Flu Vaccine  Hyperlipidemia This is a chronic problem. The current episode started more than 1 year ago. The problem is controlled. Recent lipid tests were reviewed and are normal. She has no history of chronic renal disease, diabetes, hypothyroidism, liver disease, obesity or nephrotic syndrome. There are no known factors aggravating her hyperlipidemia. Pertinent negatives include no chest pain, focal sensory loss, focal weakness, leg pain, myalgias or shortness of breath. Current antihyperlipidemic treatment includes statins. The current treatment provides moderate improvement of lipids. There are no compliance problems.  There are no known risk factors for coronary artery disease.  Cerebrovascular Accident This is a chronic problem. The current episode started more than 1 year ago. The problem occurs rarely. The problem has been waxing and waning. Pertinent negatives include no abdominal pain, anorexia, arthralgias, change in bowel habit, chest pain, chills, congestion, coughing, diaphoresis, fatigue, fever, headaches, joint swelling, myalgias, nausea, neck pain, numbness, rash, sore throat, swollen glands, urinary symptoms, vertigo, visual change, vomiting or weakness. Nothing aggravates the symptoms. She has tried nothing for the symptoms. The treatment provided mild relief.  Depression        This is a chronic problem.  The current episode started 1 to 4 weeks ago.   The onset quality is gradual.   Associated symptoms include no decreased concentration, no fatigue, no helplessness, no hopelessness, does not have insomnia, not irritable, no restlessness, no decreased interest, no appetite change, no body aches, no myalgias, no headaches, no indigestion, not sad and no suicidal ideas.     The symptoms are aggravated by nothing.  Past treatments include  SNRIs - Serotonin and norepinephrine reuptake inhibitors.  Compliance with treatment is good.  Previous treatment provided moderate relief.   Pertinent negatives include no hypothyroidism.   Lab Results  Component Value Date   CREATININE 1.05 (H) 01/22/2020   BUN 17 01/22/2020   NA 144 01/22/2020   K 4.1 01/22/2020   CL 103 01/22/2020   CO2 26 01/22/2020   Lab Results  Component Value Date   CHOL 172 01/22/2020   HDL 60 01/22/2020   LDLCALC 101 (H) 01/22/2020   TRIG 56 01/22/2020   CHOLHDL 3.0 03/18/2019   No results found for: TSH Lab Results  Component Value Date   HGBA1C 6.0 (H) 03/18/2020   Lab Results  Component Value Date   WBC 10.8 01/22/2020   HGB 14.2 01/22/2020   HCT 41.1 01/22/2020   MCV 92 01/22/2020   PLT 243 01/22/2020   Lab Results  Component Value Date   ALT 6 07/28/2019   AST 18 07/28/2019   ALKPHOS 100 07/28/2019   BILITOT 0.4 07/28/2019     Review of Systems  Constitutional: Negative.  Negative for appetite change, chills, diaphoresis, fatigue, fever and unexpected weight change.  HENT: Negative for congestion, ear discharge, ear pain, rhinorrhea, sinus pressure, sneezing and sore throat.   Eyes: Negative for photophobia, pain, discharge, redness and itching.  Respiratory: Negative for cough, shortness of breath, wheezing and stridor.   Cardiovascular: Negative for chest pain.  Gastrointestinal: Negative for abdominal pain, anorexia, blood in stool, change in bowel habit, constipation, diarrhea, nausea and vomiting.  Endocrine: Negative for cold intolerance, heat intolerance, polydipsia, polyphagia and polyuria.  Genitourinary: Negative for dysuria, flank pain, frequency, hematuria, menstrual problem, pelvic pain, urgency, vaginal bleeding and  vaginal discharge.  Musculoskeletal: Negative for arthralgias, back pain, joint swelling, myalgias and neck pain.  Skin: Negative for rash.  Allergic/Immunologic: Negative for environmental allergies and  food allergies.  Neurological: Negative for dizziness, vertigo, focal weakness, weakness, light-headedness, numbness and headaches.  Hematological: Negative for adenopathy. Does not bruise/bleed easily.  Psychiatric/Behavioral: Positive for depression. Negative for decreased concentration, dysphoric mood and suicidal ideas. The patient is not nervous/anxious and does not have insomnia.     Patient Active Problem List   Diagnosis Date Noted  . Need for vaccination 03/18/2019  . H/O hypercholesterolemia 03/18/2019  . Drug-induced obesity 03/18/2019  . Coronary artery disease 03/18/2019  . Cataract cortical, senile 03/18/2019  . Taking medication for chronic disease 03/18/2019  . Numbness and tingling of right upper extremity 08/15/2016  . Lipoma of right upper extremity 08/15/2016  . Cerebral microvascular disease 07/23/2015  . Familial multiple lipoprotein-type hyperlipidemia 02/16/2015  . Anxiety attack 02/16/2015  . Recurrent major depressive episodes ([name]) 02/16/2015  . Essential (primary) hypertension 02/16/2015  . H/O: HTN (hypertension) 02/16/2015  . Asymptomatic hypertensive urgency 11/14/2014  . Tubulovillous adenoma of rectum 10/21/2014  . Depression 10/21/2014  . Nuclear sclerosis of both eyes 07/07/2014    Allergies  Allergen Reactions  . Amoxicillin Hives  . Penicillins Hives    Past Surgical History:  Procedure Laterality Date  . GALLBLADDER SURGERY  10/24/2007  . polyp removed     rectum- benign    Social History   Tobacco Use  . Smoking status: Former Smoker    Packs/day: 1.00    Years: 20.00    Pack years: 20.00    Types: Cigarettes    Quit date: 55    Years since quitting: 54.7  . Smokeless tobacco: Never Used  Vaping Use  . Vaping Use: Never used  Substance Use Topics  . Alcohol use: Yes    Alcohol/week: 0.0 standard drinks    Comment: special occasions  . Drug use: No     Medication list has been reviewed and updated.  Current Meds    Medication Sig  . cloNIDine (CATAPRES - DOSED IN MG/24 HR) 0.1 mg/24hr patch Place 1 patch (0.1 mg total) onto the skin once a week. (Patient taking differently: Place 0.1 mg onto the skin once a week. [place])  . clopidogrel (PLAVIX) 75 MG tablet Take 1 tablet (75 mg total) by mouth daily.  . cyanocobalamin (,VITAMIN B-12,) 1000 MCG/ML injection shah  . donepezil (ARICEPT) 5 MG tablet shah  . fexofenadine (ALLEGRA) 180 MG tablet Take 1 tablet (180 mg total) by mouth daily. otc  . losartan-hydrochlorothiazide (HYZAAR) 100-25 MG tablet Take 1 tablet by mouth daily.  Marland Kitchen lovastatin (MEVACOR) 20 MG tablet TAKE 2 TABLETS (40 MG TOTAL) BY MOUTH DAILY.  . metoprolol succinate (TOPROL-XL) 100 MG 24 hr tablet Take 1 tablet (100 mg total) by mouth daily. (Patient taking differently: Take 50 mg by mouth daily. [place])  . venlafaxine XR (EFFEXOR-XR) 75 MG 24 hr capsule Take 1 capsule (75 mg total) by mouth daily.    PHQ 2/9 Scores 04/12/2020 03/18/2020 03/08/2020 02/04/2020  PHQ - 2 Score 0 0 0 0  PHQ- 9 Score 1 0 0 -    GAD 7 : Generalized Anxiety Score 04/12/2020 03/18/2020 03/08/2020 01/21/2020  Nervous, Anxious, on Edge 0 0 0 1  Control/stop worrying 0 0 0 0  Worry too much - different things 0 0 0 0  Trouble relaxing 0 0 0 0  Restless 0 0 0  0  Easily annoyed or irritable 0 0 0 1  Afraid - awful might happen 0 0 0 0  Total GAD 7 Score 0 0 0 2  Anxiety Difficulty Not difficult at all - Not difficult at all Not difficult at all    BP Readings from Last 3 Encounters:  07/22/20 130/80  04/13/20 (!) 210/90  04/12/20 (!) 250/80    Physical Exam Vitals and nursing note reviewed.  Constitutional:      General: She is not irritable.    Appearance: She is well-developed.  HENT:     Head: Normocephalic.     Right Ear: Tympanic membrane and external ear normal.     Left Ear: Tympanic membrane and external ear normal.     Nose: Nose normal.     Mouth/Throat:     Mouth: Mucous membranes are  moist.  Eyes:     General: Lids are everted, no foreign bodies appreciated. No scleral icterus.       Left eye: No foreign body or hordeolum.     Conjunctiva/sclera: Conjunctivae normal.     Right eye: Right conjunctiva is not injected.     Left eye: Left conjunctiva is not injected.     Pupils: Pupils are equal, round, and reactive to light.  Neck:     Thyroid: No thyromegaly.     Vascular: No JVD.     Trachea: No tracheal deviation.  Cardiovascular:     Rate and Rhythm: Normal rate and regular rhythm.     Heart sounds: Normal heart sounds. No murmur heard.  No friction rub. No gallop.   Pulmonary:     Effort: Pulmonary effort is normal. No respiratory distress.     Breath sounds: Normal breath sounds. No wheezing or rales.  Abdominal:     General: Bowel sounds are normal.     Palpations: Abdomen is soft. There is no mass.     Tenderness: There is no abdominal tenderness. There is no right CVA tenderness, left CVA tenderness, guarding or rebound.  Musculoskeletal:        General: No tenderness. Normal range of motion.     Cervical back: Normal range of motion and neck supple.  Lymphadenopathy:     Cervical: No cervical adenopathy.  Skin:    General: Skin is warm.     Findings: No rash.  Neurological:     Mental Status: She is alert and oriented to person, place, and time.     Cranial Nerves: No cranial nerve deficit.     Deep Tendon Reflexes: Reflexes normal.  Psychiatric:        Mood and Affect: Mood is not anxious or depressed.     Wt Readings from Last 3 Encounters:  07/22/20 202 lb (91.6 kg)  04/13/20 199 lb (90.3 kg)  04/12/20 199 lb (90.3 kg)    BP 130/80   Pulse 60   Ht 5\' 7"  (1.702 m)   Wt 202 lb (91.6 kg)   BMI 31.64 kg/m   Assessment and Plan: 1. Cerebral microvascular disease Chronic.  Controlled.  Stable.  Continue Plavix 75 mg once a day. - clopidogrel (PLAVIX) 75 MG tablet; Take 1 tablet (75 mg total) by mouth daily.  Dispense: 90 tablet; Refill:  1  2. Essential (primary) hypertension Chronic.  Controlled.  Stable.  Blood pressure 130/80.  Followed by Dr. Towanda Malkin.  3. Familial multiple lipoprotein-type hyperlipidemia Chronic.  Controlled.  Stable.  Continue lovastatin 20 mg 2 tablets for total 40 mg  daily. - lovastatin (MEVACOR) 20 MG tablet; TAKE 2 TABLETS (40 MG TOTAL) BY MOUTH DAILY.  Dispense: 180 tablet; Refill: 1  4. Recurrent major depressive episodes (HCC) Chronic.  Controlled.  Stable.  PHQ is 0 Gad score 0 continue venlafaxine extended release 75 mg once a day. - venlafaxine XR (EFFEXOR-XR) 75 MG 24 hr capsule; Take 1 capsule (75 mg total) by mouth daily.  Dispense: 90 capsule; Refill: 1  5. Need for immunization against influenza Discussed and administered - Flu Vaccine QUAD High Dose(Fluad)

## 2020-08-04 DIAGNOSIS — I10 Essential (primary) hypertension: Secondary | ICD-10-CM | POA: Diagnosis not present

## 2020-08-04 DIAGNOSIS — R4189 Other symptoms and signs involving cognitive functions and awareness: Secondary | ICD-10-CM | POA: Diagnosis not present

## 2020-08-04 DIAGNOSIS — Z8673 Personal history of transient ischemic attack (TIA), and cerebral infarction without residual deficits: Secondary | ICD-10-CM | POA: Diagnosis not present

## 2020-08-16 ENCOUNTER — Other Ambulatory Visit: Payer: Self-pay | Admitting: Family Medicine

## 2020-08-16 DIAGNOSIS — I10 Essential (primary) hypertension: Secondary | ICD-10-CM

## 2020-09-02 DIAGNOSIS — R4189 Other symptoms and signs involving cognitive functions and awareness: Secondary | ICD-10-CM | POA: Diagnosis not present

## 2020-09-02 DIAGNOSIS — I2510 Atherosclerotic heart disease of native coronary artery without angina pectoris: Secondary | ICD-10-CM | POA: Diagnosis not present

## 2020-09-02 DIAGNOSIS — E6609 Other obesity due to excess calories: Secondary | ICD-10-CM | POA: Diagnosis not present

## 2020-09-02 DIAGNOSIS — I10 Essential (primary) hypertension: Secondary | ICD-10-CM | POA: Diagnosis not present

## 2020-09-02 DIAGNOSIS — R0602 Shortness of breath: Secondary | ICD-10-CM | POA: Diagnosis not present

## 2020-09-02 DIAGNOSIS — Z8673 Personal history of transient ischemic attack (TIA), and cerebral infarction without residual deficits: Secondary | ICD-10-CM | POA: Diagnosis not present

## 2020-09-02 DIAGNOSIS — R001 Bradycardia, unspecified: Secondary | ICD-10-CM | POA: Diagnosis not present

## 2020-09-02 DIAGNOSIS — I639 Cerebral infarction, unspecified: Secondary | ICD-10-CM | POA: Diagnosis not present

## 2020-09-02 DIAGNOSIS — Z6831 Body mass index (BMI) 31.0-31.9, adult: Secondary | ICD-10-CM | POA: Diagnosis not present

## 2020-11-04 ENCOUNTER — Other Ambulatory Visit: Payer: Self-pay | Admitting: Family Medicine

## 2020-11-04 DIAGNOSIS — I10 Essential (primary) hypertension: Secondary | ICD-10-CM

## 2020-12-08 ENCOUNTER — Other Ambulatory Visit: Payer: Self-pay | Admitting: Internal Medicine

## 2020-12-08 DIAGNOSIS — I10 Essential (primary) hypertension: Secondary | ICD-10-CM

## 2021-01-06 ENCOUNTER — Other Ambulatory Visit: Payer: Self-pay | Admitting: Family Medicine

## 2021-01-06 DIAGNOSIS — I6789 Other cerebrovascular disease: Secondary | ICD-10-CM

## 2021-01-06 DIAGNOSIS — F339 Major depressive disorder, recurrent, unspecified: Secondary | ICD-10-CM

## 2021-01-06 NOTE — Telephone Encounter (Signed)
Requested Prescriptions  Pending Prescriptions Disp Refills  . clopidogrel (PLAVIX) 75 MG tablet [Pharmacy Med Name: CLOPIDOGREL 75 MG Tablet] 90 tablet 0    Sig: TAKE 1 TABLET EVERY DAY     Hematology: Antiplatelets - clopidogrel Failed - 01/06/2021  5:08 AM      Failed - Evaluate AST, ALT within 2 months of therapy initiation.      Failed - ALT in normal range and within 360 days    ALT  Date Value Ref Range Status  07/28/2019 6 0 - 32 IU/L Final         Failed - AST in normal range and within 360 days    AST  Date Value Ref Range Status  07/28/2019 18 0 - 40 IU/L Final         Failed - HCT in normal range and within 180 days    Hematocrit  Date Value Ref Range Status  01/22/2020 41.1 34.0 - 46.6 % Final         Failed - HGB in normal range and within 180 days    Hemoglobin  Date Value Ref Range Status  01/22/2020 14.2 11.1 - 15.9 g/dL Final         Failed - PLT in normal range and within 180 days    Platelets  Date Value Ref Range Status  01/22/2020 243 150 - 450 x10E3/uL Final         Passed - Valid encounter within last 6 months    Recent Outpatient Visits          5 months ago Need for immunization against influenza   [place], Deanna C, MD   8 months ago Essential hypertension   [place], Deanna C, MD   8 months ago Essential hypertension   [place], MD   9 months ago Hyperglycemia   [facility] Juline Patch, MD   10 months ago Essential (primary) hypertension   [place], Deanna C, MD      Future Appointments            In 1 week Juline Patch, MD [facility], [place]           . venlafaxine XR (EFFEXOR-XR) 75 MG 24 hr capsule [Pharmacy Med Name: VENLAFAXINE HYDROCHLORIDEER 75 MG Capsule Extended Release 24 Hour] 90 capsule 0    Sig: TAKE 1 CAPSULE EVERY DAY     Psychiatry: Antidepressants - SNRI - desvenlafaxine & venlafaxine Failed -  01/06/2021  5:08 AM      Failed - LDL in normal range and within 360 days    LDL Chol Calc (NIH)  Date Value Ref Range Status  01/22/2020 101 (H) 0 - 99 mg/dL Final         Passed - Total Cholesterol in normal range and within 360 days    Cholesterol, Total  Date Value Ref Range Status  01/22/2020 172 100 - 199 mg/dL Final         Passed - Triglycerides in normal range and within 360 days    Triglycerides  Date Value Ref Range Status  01/22/2020 56 0 - 149 mg/dL Final         Passed - Completed PHQ-2 or PHQ-9 in the last 360 days      Passed - Last BP in normal range    BP Readings from Last 1 Encounters:  07/22/20 130/80  Passed - Valid encounter within last 6 months    Recent Outpatient Visits          5 months ago Need for immunization against influenza   [place], MD   8 months ago Essential hypertension   [place], MD   8 months ago Essential hypertension   [place], MD   9 months ago Hyperglycemia   [facility] Juline Patch, MD   10 months ago Essential (primary) hypertension   [place], Deanna C, MD      Future Appointments            In 1 week Juline Patch, MD [facility], [facility]

## 2021-01-19 ENCOUNTER — Encounter: Payer: Self-pay | Admitting: Family Medicine

## 2021-01-19 ENCOUNTER — Other Ambulatory Visit: Payer: Self-pay

## 2021-01-19 ENCOUNTER — Ambulatory Visit (INDEPENDENT_AMBULATORY_CARE_PROVIDER_SITE_OTHER): Payer: Medicare HMO | Admitting: Family Medicine

## 2021-01-19 VITALS — BP 128/80 | HR 60 | Ht 67.0 in | Wt 210.0 lb

## 2021-01-19 DIAGNOSIS — R739 Hyperglycemia, unspecified: Secondary | ICD-10-CM | POA: Diagnosis not present

## 2021-01-19 DIAGNOSIS — F339 Major depressive disorder, recurrent, unspecified: Secondary | ICD-10-CM

## 2021-01-19 DIAGNOSIS — I10 Essential (primary) hypertension: Secondary | ICD-10-CM

## 2021-01-19 DIAGNOSIS — Z23 Encounter for immunization: Secondary | ICD-10-CM

## 2021-01-19 DIAGNOSIS — E7849 Other hyperlipidemia: Secondary | ICD-10-CM

## 2021-01-19 DIAGNOSIS — I6789 Other cerebrovascular disease: Secondary | ICD-10-CM | POA: Diagnosis not present

## 2021-01-19 MED ORDER — LOSARTAN POTASSIUM-HCTZ 100-25 MG PO TABS: 1.0000 | ORAL_TABLET | Freq: Every day | ORAL | 1 refills | Status: DC

## 2021-01-19 MED ORDER — VENLAFAXINE HCL ER 75 MG PO CP24: 75.0000 mg | ORAL_CAPSULE | Freq: Every day | ORAL | 1 refills | Status: DC

## 2021-01-19 MED ORDER — LOVASTATIN 20 MG PO TABS: ORAL_TABLET | ORAL | 1 refills | Status: DC

## 2021-01-19 MED ORDER — CLOPIDOGREL BISULFATE 75 MG PO TABS: 75.0000 mg | ORAL_TABLET | Freq: Every day | ORAL | 1 refills | Status: DC

## 2021-01-19 NOTE — Progress Notes (Signed)
Date:  01/19/2021   Name:  Karina Leonard   DOB:  [birth year]   MRN:  [id number]   Chief Complaint: Hyperlipidemia, Hypertension, Depression, Cerebrovascular Accident (Takes plavix for this), and Prediabetes  Hyperlipidemia This is a chronic problem. The current episode started more than 1 year ago. The problem is controlled. Recent lipid tests were reviewed and are normal. She has no history of chronic renal disease, diabetes, hypothyroidism, liver disease, obesity or nephrotic syndrome. Pertinent negatives include no chest pain, focal sensory loss, focal weakness, leg pain, myalgias or shortness of breath. Current antihyperlipidemic treatment includes statins. The current treatment provides moderate improvement of lipids. There are no compliance problems.   Hypertension This is a chronic problem. The current episode started more than 1 year ago. The problem has been waxing and waning since onset. The problem is controlled. Pertinent negatives include no anxiety, blurred vision, chest pain, headaches, malaise/fatigue, neck pain, orthopnea, palpitations, peripheral edema, PND, shortness of breath or sweats. Past treatments include diuretics, angiotensin blockers, central alpha agonists and beta blockers. The current treatment provides moderate improvement. There are no compliance problems.  There is no history of angina, kidney disease, CAD/MI, CVA, heart failure, left ventricular hypertrophy, PVD or retinopathy. There is no history of chronic renal disease, a hypertension causing med or renovascular disease.  Depression        This is a chronic problem.  The current episode started more than 1 year ago.   The problem occurs intermittently.  The problem has been waxing and waning since onset.  Associated symptoms include no decreased concentration, no fatigue, no helplessness, no hopelessness, no restlessness, no decreased interest, no myalgias, no headaches and not sad.  Past treatments include SNRIs  - Serotonin and norepinephrine reuptake inhibitors.  Compliance with treatment is good.   Pertinent negatives include no hypothyroidism and no anxiety. Cerebrovascular Accident This is a chronic problem. The problem occurs daily. The problem has been gradually improving. Pertinent negatives include no abdominal pain, anorexia, arthralgias, change in bowel habit, chest pain, chills, congestion, coughing, fatigue, fever, headaches, joint swelling, myalgias, nausea, neck pain, numbness, rash, sore throat, swollen glands, urinary symptoms, vertigo, vomiting or weakness.    Lab Results  Component Value Date   CREATININE 1.05 (H) 01/22/2020   BUN 17 01/22/2020   NA 144 01/22/2020   K 4.1 01/22/2020   CL 103 01/22/2020   CO2 26 01/22/2020   Lab Results  Component Value Date   CHOL 172 01/22/2020   HDL 60 01/22/2020   LDLCALC 101 (H) 01/22/2020   TRIG 56 01/22/2020   CHOLHDL 3.0 03/18/2019   No results found for: TSH Lab Results  Component Value Date   HGBA1C 6.0 (H) 03/18/2020   Lab Results  Component Value Date   WBC 10.8 01/22/2020   HGB 14.2 01/22/2020   HCT 41.1 01/22/2020   MCV 92 01/22/2020   PLT 243 01/22/2020   Lab Results  Component Value Date   ALT 6 07/28/2019   AST 18 07/28/2019   ALKPHOS 100 07/28/2019   BILITOT 0.4 07/28/2019     Review of Systems  Constitutional: Negative.  Negative for chills, fatigue, fever, malaise/fatigue and unexpected weight change.  HENT: Negative for congestion, ear discharge, ear pain, rhinorrhea, sinus pressure, sneezing and sore throat.   Eyes: Negative for blurred vision, photophobia, pain, discharge, redness and itching.  Respiratory: Negative for cough, shortness of breath, wheezing and stridor.   Cardiovascular: Negative for chest pain, palpitations, orthopnea and  PND.  Gastrointestinal: Negative for abdominal pain, anorexia, blood in stool, change in bowel habit, constipation, diarrhea, nausea and vomiting.  Endocrine:  Negative for cold intolerance, heat intolerance, polydipsia, polyphagia and polyuria.  Genitourinary: Negative for dysuria, flank pain, frequency, hematuria, menstrual problem, pelvic pain, urgency, vaginal bleeding and vaginal discharge.  Musculoskeletal: Negative for arthralgias, back pain, joint swelling, myalgias and neck pain.  Skin: Negative for rash.  Allergic/Immunologic: Negative for environmental allergies and food allergies.  Neurological: Negative for dizziness, vertigo, focal weakness, weakness, light-headedness, numbness and headaches.  Hematological: Negative for adenopathy. Does not bruise/bleed easily.  Psychiatric/Behavioral: Positive for depression. Negative for decreased concentration and dysphoric mood. The patient is not nervous/anxious.     Patient Active Problem List   Diagnosis Date Noted  . Need for vaccination 03/18/2019  . H/O hypercholesterolemia 03/18/2019  . Drug-induced obesity 03/18/2019  . Coronary artery disease 03/18/2019  . Cataract cortical, senile 03/18/2019  . Taking medication for chronic disease 03/18/2019  . Numbness and tingling of right upper extremity 08/15/2016  . Lipoma of right upper extremity 08/15/2016  . Cerebral microvascular disease 07/23/2015  . Familial multiple lipoprotein-type hyperlipidemia 02/16/2015  . Anxiety attack 02/16/2015  . Recurrent major depressive episodes ([place]) 02/16/2015  . Essential (primary) hypertension 02/16/2015  . H/O: HTN (hypertension) 02/16/2015  . Asymptomatic hypertensive urgency 11/14/2014  . Tubulovillous adenoma of rectum 10/21/2014  . Depression 10/21/2014  . Nuclear sclerosis of both eyes 07/07/2014    Allergies  Allergen Reactions  . Amoxicillin Hives  . Penicillins Hives    Past Surgical History:  Procedure Laterality Date  . GALLBLADDER SURGERY  10/24/2007  . polyp removed     rectum- benign    Social History   Tobacco Use  . Smoking status: Former Smoker    Packs/day: 1.00     Years: 20.00    Pack years: 20.00    Types: Cigarettes    Quit date: 22    Years since quitting: 55.2  . Smokeless tobacco: Never Used  Vaping Use  . Vaping Use: Never used  Substance Use Topics  . Alcohol use: Yes    Alcohol/week: 0.0 standard drinks    Comment: special occasions  . Drug use: No     Medication list has been reviewed and updated.  Current Meds  Medication Sig  . cloNIDine (CATAPRES - DOSED IN MG/24 HR) 0.1 mg/24hr patch PLACE 1 PATCH ONTO THE SKIN ONCE A WEEK.  . clopidogrel (PLAVIX) 75 MG tablet TAKE 1 TABLET EVERY DAY  . cyanocobalamin (,VITAMIN B-12,) 1000 MCG/ML injection shah  . fexofenadine (ALLEGRA) 180 MG tablet Take 1 tablet (180 mg total) by mouth daily. otc  . losartan-hydrochlorothiazide (HYZAAR) 100-25 MG tablet TAKE 1 TABLET EVERY DAY  . lovastatin (MEVACOR) 20 MG tablet TAKE 2 TABLETS (40 MG TOTAL) BY MOUTH DAILY.  . metoprolol succinate (TOPROL-XL) 100 MG 24 hr tablet Take 1 tablet (100 mg total) by mouth daily. (Patient taking differently: Take 50 mg by mouth daily. [place])  . venlafaxine XR (EFFEXOR-XR) 75 MG 24 hr capsule TAKE 1 CAPSULE EVERY DAY    PHQ 2/9 Scores 01/19/2021 04/12/2020 03/18/2020 03/08/2020  PHQ - 2 Score 1 0 0 0  PHQ- 9 Score 2 1 0 0    GAD 7 : Generalized Anxiety Score 01/19/2021 04/12/2020 03/18/2020 03/08/2020  Nervous, Anxious, on Edge 1 0 0 0  Control/stop worrying 2 0 0 0  Worry too much - different things 2 0 0 0  Trouble relaxing 0  0 0 0  Restless 0 0 0 0  Easily annoyed or irritable 0 0 0 0  Afraid - awful might happen 1 0 0 0  Total GAD 7 Score 6 0 0 0  Anxiety Difficulty Somewhat difficult Not difficult at all - Not difficult at all    BP Readings from Last 3 Encounters:  01/19/21 128/80  07/22/20 130/80  04/13/20 (!) 210/90    Physical Exam Vitals and nursing note reviewed.  Constitutional:      Appearance: She is well-developed.  HENT:     Head: Normocephalic.     Right Ear: Tympanic membrane,  ear canal and external ear normal. There is no impacted cerumen.     Left Ear: Tympanic membrane, ear canal and external ear normal. There is no impacted cerumen.     Nose: Nose normal. No congestion or rhinorrhea.     Mouth/Throat:     Mouth: Mucous membranes are moist.  Eyes:     General: Lids are everted, no foreign bodies appreciated. No scleral icterus.       Left eye: No foreign body or hordeolum.     Conjunctiva/sclera: Conjunctivae normal.     Right eye: Right conjunctiva is not injected.     Left eye: Left conjunctiva is not injected.     Pupils: Pupils are equal, round, and reactive to light.  Neck:     Thyroid: No thyromegaly.     Vascular: No JVD.     Trachea: No tracheal deviation.  Cardiovascular:     Rate and Rhythm: Normal rate and regular rhythm.     Heart sounds: Normal heart sounds. No murmur heard. No friction rub. No gallop.   Pulmonary:     Effort: Pulmonary effort is normal. No respiratory distress.     Breath sounds: Normal breath sounds. No wheezing, rhonchi or rales.  Chest:     Chest wall: No tenderness.  Abdominal:     General: Bowel sounds are normal.     Palpations: Abdomen is soft. There is no mass.     Tenderness: There is no abdominal tenderness. There is no guarding or rebound.  Musculoskeletal:        General: No tenderness. Normal range of motion.     Cervical back: Normal range of motion and neck supple.  Lymphadenopathy:     Cervical: No cervical adenopathy.  Skin:    General: Skin is warm.     Findings: No rash.  Neurological:     Mental Status: She is alert and oriented to person, place, and time.     Cranial Nerves: No cranial nerve deficit.     Deep Tendon Reflexes: Reflexes normal.  Psychiatric:        Mood and Affect: Mood is not anxious or depressed.     Wt Readings from Last 3 Encounters:  01/19/21 210 lb (95.3 kg)  07/22/20 202 lb (91.6 kg)  04/13/20 199 lb (90.3 kg)    BP 128/80   Pulse 60   Ht 5\' 7"  (1.702 m)   Wt  210 lb (95.3 kg)   BMI 32.89 kg/m   Assessment and Plan: 1. Cerebral microvascular disease Chronic.  Controlled.  Stable.  Continue Plavix 75 mg once a day. - clopidogrel (PLAVIX) 75 MG tablet; Take 1 tablet (75 mg total) by mouth daily.  Dispense: 90 tablet; Refill: 1  2. Essential (primary) hypertension Chronic.  Controlled.  Stable.  Blood pressure 128/80.  Continue losartan hydrochlorothiazide 100-25 mg once a  day.  Will obtain a CMP for electrolytes and GFR.  Will recheck in 6 months. - losartan-hydrochlorothiazide (HYZAAR) 100-25 MG tablet; Take 1 tablet by mouth daily.  Dispense: 90 tablet; Refill: 1 - Comprehensive Metabolic Panel (CMET)  3. Familial multiple lipoprotein-type hyperlipidemia Chronic.  Controlled.  Stable.  Continue lovastatin 20 mg 2 tablets total 40 mg once a day.  Prefers to take his twice a day. - lovastatin (MEVACOR) 20 MG tablet; TAKE 2 TABLETS (40 MG TOTAL) BY MOUTH DAILY.  Dispense: 180 tablet; Refill: 1 - Lipid Panel With LDL/HDL Ratio  4. Recurrent major depressive episodes ([facility]) Chronic.  Controlled.  Stable.  PHQ is 2 Gad score is 6 patient has had a recent loss of spouse and is coping with grief at this time.  Patient is doing relatively well given the circumstance.  We will continue venlafaxine 75 mg XR once a day. - venlafaxine XR (EFFEXOR-XR) 75 MG 24 hr capsule; Take 1 capsule (75 mg total) by mouth daily.  Dispense: 90 capsule; Refill: 1  5. Hyperglycemia Elevated glucose in the past we will check an A1c. - HgB A1c

## 2021-01-20 LAB — LIPID PANEL WITH LDL/HDL RATIO
Cholesterol, Total: 169 mg/dL (ref 100–199)
HDL: 58 mg/dL (ref >39)
LDL Chol Calc (NIH): 97 mg/dL (ref 0–99)
LDL/HDL Ratio: 1.7 ratio (ref 0.0–3.2)
Triglycerides: 73 mg/dL (ref 0–149)
VLDL Cholesterol Cal: 14 mg/dL (ref 5–40)

## 2021-01-20 LAB — COMPREHENSIVE METABOLIC PANEL
ALT: 12 IU/L (ref 0–32)
AST: 21 IU/L (ref 0–40)
Albumin/Globulin Ratio: 1.5 (ref 1.2–2.2)
Albumin: 4.1 g/dL (ref 3.7–4.7)
Alkaline Phosphatase: 109 IU/L (ref 44–121)
BUN/Creatinine Ratio: 18 (ref 12–28)
BUN: 20 mg/dL (ref 8–27)
Bilirubin Total: 0.4 mg/dL (ref 0.0–1.2)
CO2: 26 mmol/L (ref 20–29)
Calcium: 9.1 mg/dL (ref 8.7–10.3)
Chloride: 103 mmol/L (ref 96–106)
Creatinine, Ser: 1.12 mg/dL — ABNORMAL HIGH (ref 0.57–1.00)
Globulin, Total: 2.8 g/dL (ref 1.5–4.5)
Glucose: 109 mg/dL — ABNORMAL HIGH (ref 65–99)
Potassium: 4.9 mmol/L (ref 3.5–5.2)
Sodium: 143 mmol/L (ref 134–144)
Total Protein: 6.9 g/dL (ref 6.0–8.5)
eGFR: 51 mL/min/{1.73_m2} — ABNORMAL LOW (ref >59)

## 2021-01-20 LAB — HEMOGLOBIN A1C
Est. average glucose Bld gHb Est-mCnc: 123 mg/dL
Hgb A1c MFr Bld: 5.9 % — ABNORMAL HIGH (ref 4.8–5.6)

## 2021-02-07 ENCOUNTER — Ambulatory Visit: Payer: Medicare HMO

## 2021-02-11 ENCOUNTER — Ambulatory Visit: Payer: Self-pay | Admitting: *Deleted

## 2021-02-11 NOTE — Telephone Encounter (Signed)
Pt is calling because she tested positive yesterday for COVID. However, she started feeling bad on 02/03/21. Pt feels fine. She wants to know is there Anything else she needs to do?  Patient had a Birthday dinner at [employer] and was exposed to [place] by a friend who attended the dinner- she states she thought she was having allergy flare and treated herself with OTC medications. She feels much better now and has received her booster vaccine yesterday.  Advised per COVID protocol- patient also advised to call office with any changes.  Reason for Disposition . [1] [id number] diagnosed by positive lab test (e.g., PCR, rapid self-test kit) AND [2] NO symptoms (e.g., cough, fever, others)  Answer Assessment - Initial Assessment Questions 1. COVID-19 DIAGNOSIS: "Who made your COVID-19 diagnosis?" "Was it confirmed by a positive lab test or self-test?" If not diagnosed by a doctor (or NP/PA), ask "Are there lots of cases (community spread) where you live?" Note: See public health department website, if unsure.     Positive home test 2. COVID-19 EXPOSURE: "Was there any known exposure to COVID before the symptoms began?" CDC Definition of close contact: within 6 feet (2 meters) for a total of 15 minutes or more over a 24-hour period.      Exposure at social event 3. ONSET: "When did the COVID-19 symptoms start?"      4/14 4. WORST SYMPTOM: "What is your worst symptom?" (e.g., cough, fever, shortness of breath, muscle aches)     Patient feels good now- started with sneezing and cough 5. COUGH: "Do you have a cough?" If Yes, ask: "How bad is the cough?"       no 6. FEVER: "Do you have a fever?" If Yes, ask: "What is your temperature, how was it measured, and when did it start?"     no 7. RESPIRATORY STATUS: "Describe your breathing?" (e.g., shortness of breath, wheezing, unable to speak)      normal 8. BETTER-SAME-WORSE: "Are you getting better, staying the same or getting worse compared to yesterday?"   If getting worse, ask, "In what way?"     better 9. HIGH RISK DISEASE: "Do you have any chronic medical problems?" (e.g., asthma, heart or lung disease, weak immune system, obesity, etc.)     High risk- heart disease 10. VACCINE: "Have you had the COVID-19 vaccine?" If Yes, ask: "Which one, how many shots, when did you get it?"       Yes- pfizer  11. BOOSTER: "Have you received your COVID-19 booster?" If Yes, ask: "Which one and when did you get it?"       Pfizer- yesterday 12. PREGNANCY: "Is there any chance you are pregnant?" "When was your last menstrual period?"       n/a 13. OTHER SYMPTOMS: "Do you have any other symptoms?"  (e.g., chills, fatigue, headache, loss of smell or taste, muscle pain, sore throat)       No symptoms 14. O2 SATURATION MONITOR:  "Do you use an oxygen saturation monitor (pulse oximeter) at home?" If Yes, ask "What is your reading (oxygen level) today?" "What is your usual oxygen saturation reading?" (e.g., 95%)       n/a  Protocols used: CORONAVIRUS (COVID-19) DIAGNOSED OR SUSPECTED-A-AH

## 2021-02-11 NOTE — Telephone Encounter (Signed)
Noted- follow COVID protocol

## 2021-03-09 ENCOUNTER — Ambulatory Visit: Payer: Medicare HMO

## 2021-04-07 ENCOUNTER — Telehealth: Payer: Self-pay | Admitting: Family Medicine

## 2021-04-07 NOTE — Telephone Encounter (Signed)
Copied from [place] [id number]. Topic: Medicare AWV >> Apr 07, 2021  1:37 PM Cher Nakai R wrote: Reason for CRM:  Left message for patient to call back and schedule Medicare Annual Wellness Visit (AWV) in office.   If unable to come into the office for AWV,  please offer to do virtually or by telephone.  Last AWV: 02/04/2020  Please schedule at anytime with [place].  40 minute appointment  Any questions, please contact me at [phone]

## 2021-05-23 DIAGNOSIS — H25813 Combined forms of age-related cataract, bilateral: Secondary | ICD-10-CM | POA: Diagnosis not present

## 2021-05-23 LAB — HM DIABETES EYE EXAM

## 2021-05-30 DIAGNOSIS — Z0100 Encounter for examination of eyes and vision without abnormal findings: Secondary | ICD-10-CM | POA: Diagnosis not present

## 2021-06-11 IMAGING — MR MR HEAD W/O CM
3 series · 48 of 48 positions shown · non-contrast
Comparison: None.

CLINICAL DATA: Cognitive impairment.

EXAM:
MRI HEAD WITHOUT CONTRAST
TECHNIQUE: Multiplanar, multiecho pulse sequences of the brain and surrounding
structures were obtained without intravenous contrast.
Additionally, using NeuroQuant software a 3D volumetric analysis of
the brain was performed and is compared to a normative database
adjusted for age, gender and intracranial volume.

[Series 52: nqsegcb_sc_cor · 1.00mm/px · 17 of 227 slices shown]
[im 1/227]
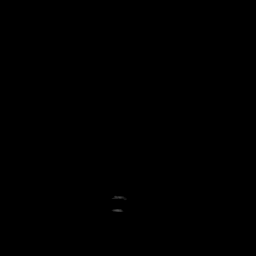
[im 15/227]
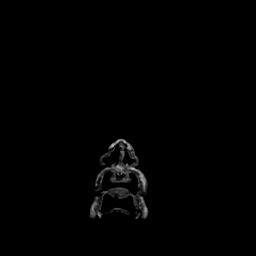
[im 29/227]
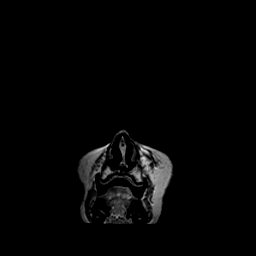
[im 43/227]
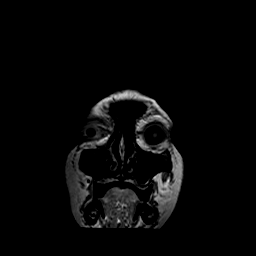
[im 57/227]
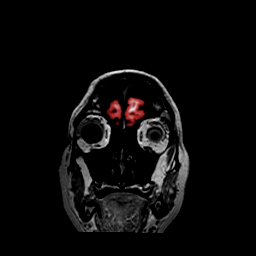
[im 71/227]
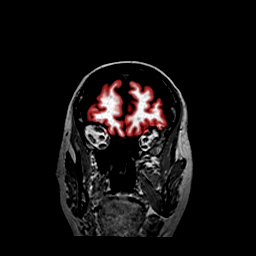
[im 85/227]
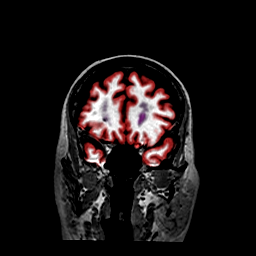
[im 99/227]
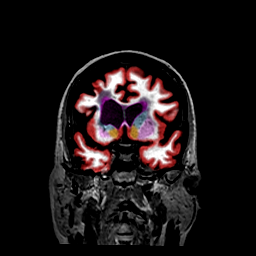
[im 114/227]
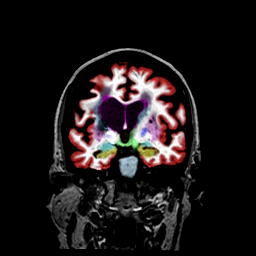
[im 128/227]
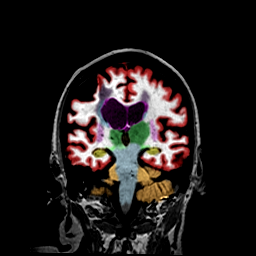
[im 142/227]
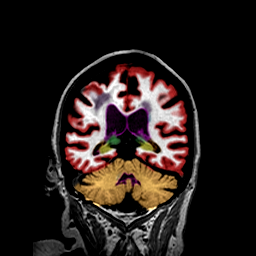
[im 156/227]
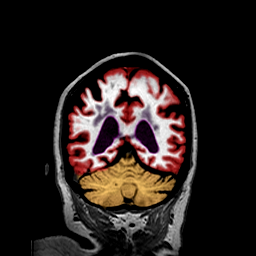
[im 170/227]
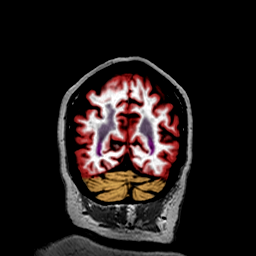
[im 184/227]
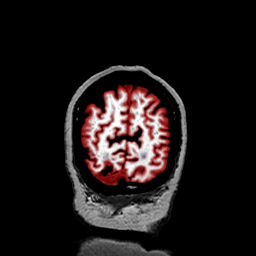
[im 198/227]
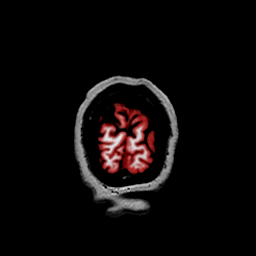
[im 212/227]
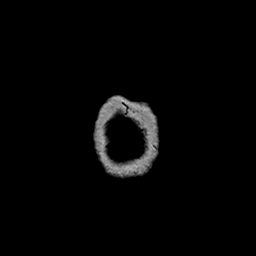
[im 227/227]
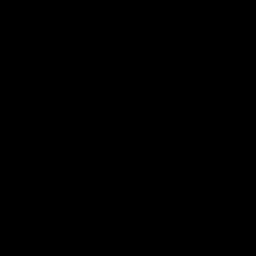

[Series 53: nqsegcb_sc_axl · 1.00mm/px · 16 of 204 slices shown]
[im 1/204]
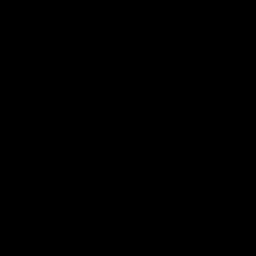
[im 14/204]
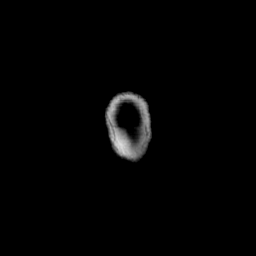
[im 28/204]
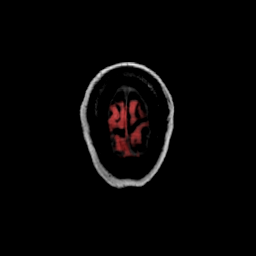
[im 41/204]
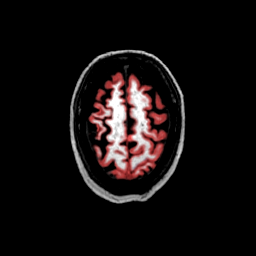
[im 55/204]
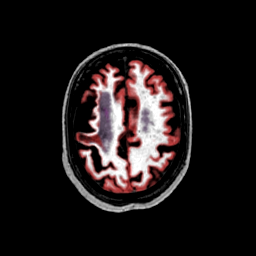
[im 68/204]
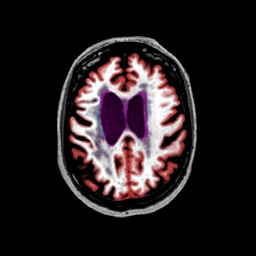
[im 82/204]
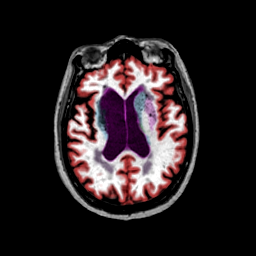
[im 95/204]
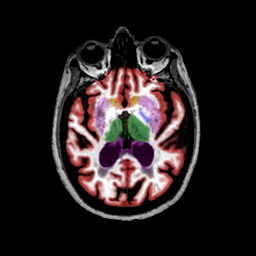
[im 109/204]
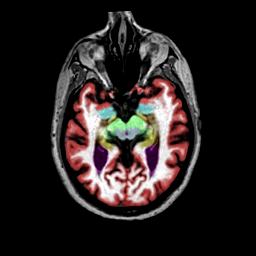
[im 122/204]
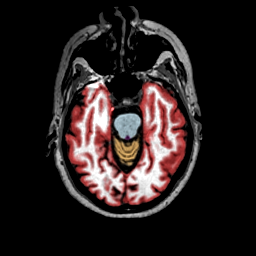
[im 136/204]
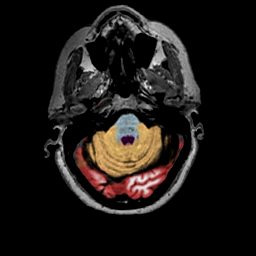
[im 149/204]
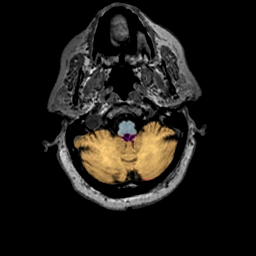
[im 163/204]
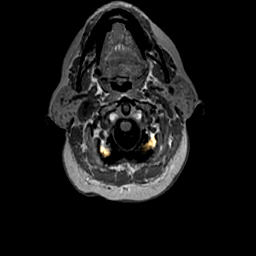
[im 176/204]
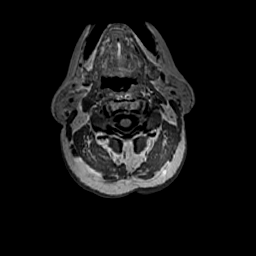
[im 190/204]
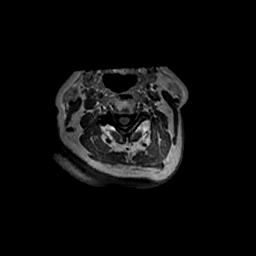
[im 204/204]
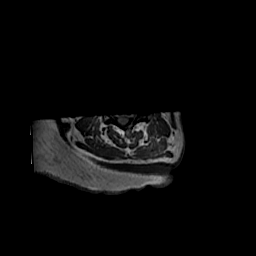

[Series 54: nqsegcb_sc_sag · 1.00mm/px · 15 of 191 slices shown]
[im 1/191]
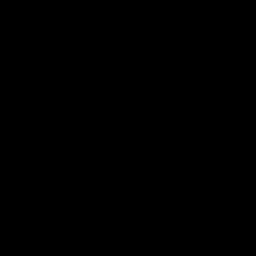
[im 14/191]
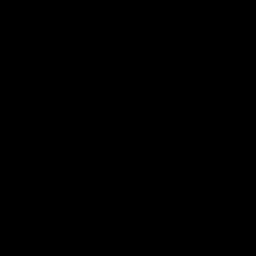
[im 28/191]
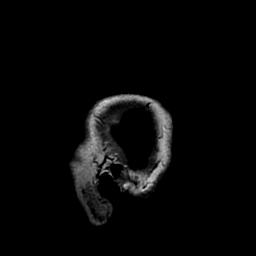
[im 41/191]
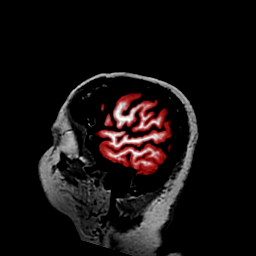
[im 55/191]
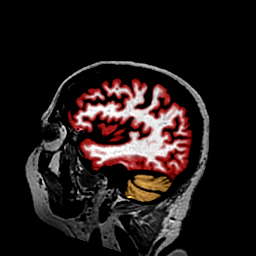
[im 68/191]
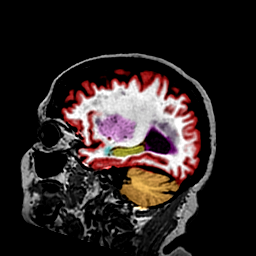
[im 82/191]
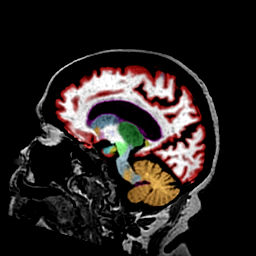
[im 96/191]
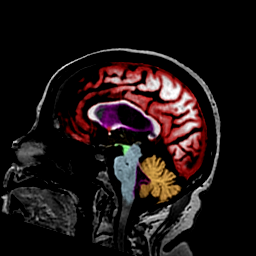
[im 109/191]
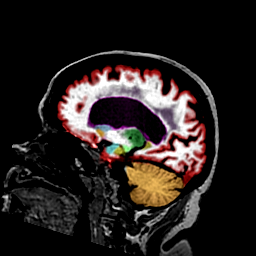
[im 123/191]
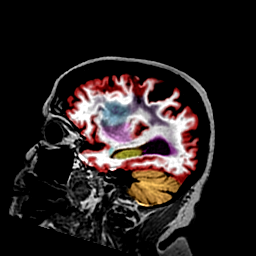
[im 136/191]
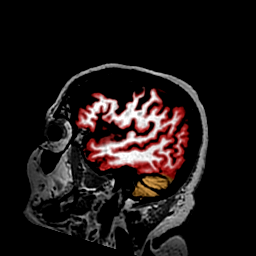
[im 150/191]
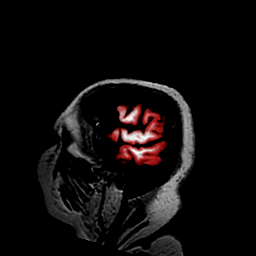
[im 163/191]
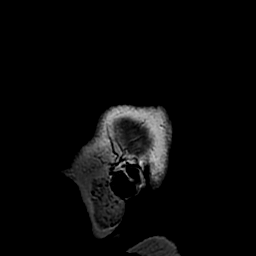
[im 177/191]
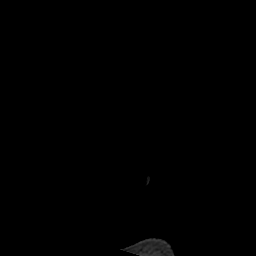
[im 191/191]
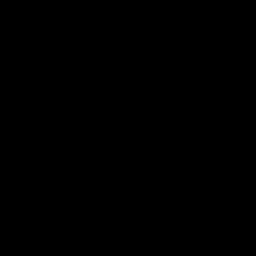

[48 of 48 positions shown; findings below may reference images not displayed]

FINDINGS: Brain: No acute infarct, acute hemorrhage or extra-axial collection.
Multiple old small vessel infarcts of the right basal ganglia, right
thalamus and pons. Early confluent hyperintense T2-weighted signal
of the periventricular and deep white matter, most commonly due to
chronic ischemic microangiopathy. Old anterior right MCA territory
infarct. Multiple chronic microhemorrhages in a predominantly
central distribution. Normal midline structures.

Vascular: Normal flow voids.

Skull and upper cervical spine: Normal marrow signal.

Sinuses/Orbits: Negative.

Other: None.

NeuroQuant Findings:

Volumetric analysis of the brain was performed, with a fully
detailed report in [HOSPITAL] PACS. Briefly, the comparison with age and
gender matched reference reveals advanced volume loss, particularly
in the cortical gray matter and advanced white matter disease.
IMPRESSION: 1. No acute intracranial abnormality.
2. Multiple old small vessel infarcts of the basal ganglia, thalamus
and pons.
3. NeuroQuant volumetric analysis of the brain, see details on
[HOSPITAL] PACS.

## 2021-06-24 ENCOUNTER — Other Ambulatory Visit: Payer: Self-pay

## 2021-06-24 DIAGNOSIS — I10 Essential (primary) hypertension: Secondary | ICD-10-CM

## 2021-06-24 MED ORDER — CLONIDINE 0.1 MG/24HR TD PTWK: MEDICATED_PATCH | TRANSDERMAL | 0 refills | Status: DC

## 2021-06-24 NOTE — Progress Notes (Unsigned)
Sent in clonidine patches

## 2021-06-25 ENCOUNTER — Telehealth: Payer: Self-pay | Admitting: Family Medicine

## 2021-06-25 NOTE — Telephone Encounter (Signed)
Copied from [place] [id number]. Topic: Medicare AWV >> Jun 25, 2021 10:41 AM Cher Nakai R wrote: Left message for patient to call back and schedule Medicare Annual Wellness Visit (AWV) in office.   If unable to come into the office for AWV,  please offer to do virtually or by telephone.  Last AWV: 02/04/2021   Please schedule at anytime with [place].  40 minute appointment  Any questions, please contact me at [phone]

## 2021-07-08 ENCOUNTER — Other Ambulatory Visit: Payer: Self-pay

## 2021-07-15 ENCOUNTER — Other Ambulatory Visit: Payer: Self-pay | Admitting: Family Medicine

## 2021-07-15 DIAGNOSIS — E7849 Other hyperlipidemia: Secondary | ICD-10-CM

## 2021-07-19 ENCOUNTER — Other Ambulatory Visit: Payer: Self-pay

## 2021-07-19 ENCOUNTER — Ambulatory Visit
Admission: EM | Admit: 2021-07-19 | Discharge: 2021-07-19 | Disposition: A | Discharge status: Home / Self Care | Payer: Medicare HMO | Attending: Emergency Medicine | Admitting: Emergency Medicine

## 2021-07-19 ENCOUNTER — Encounter: Payer: Self-pay | Admitting: Emergency Medicine

## 2021-07-19 DIAGNOSIS — R32 Unspecified urinary incontinence: Secondary | ICD-10-CM | POA: Insufficient documentation

## 2021-07-19 DIAGNOSIS — N3001 Acute cystitis with hematuria: Secondary | ICD-10-CM | POA: Diagnosis not present

## 2021-07-19 DIAGNOSIS — R300 Dysuria: Secondary | ICD-10-CM

## 2021-07-19 HISTORY — DX: Unspecified urinary incontinence: R32

## 2021-07-19 LAB — URINALYSIS, COMPLETE (UACMP) WITH MICROSCOPIC
Bilirubin Urine: NEGATIVE
Glucose, UA: NEGATIVE mg/dL
Nitrite: NEGATIVE
Protein, ur: 100 mg/dL — AB
Specific Gravity, Urine: 1.025 (ref 1.005–1.030)
WBC, UA: >50 WBC/hpf (ref 0–5)
pH: 5 (ref 5.0–8.0)

## 2021-07-19 MED ORDER — SULFAMETHOXAZOLE-TRIMETHOPRIM 800-160 MG PO TABS
1.0000 | ORAL_TABLET | Freq: Two times a day (BID) | ORAL | 0 refills | Status: AC
Start: 2021-07-19 — End: 2021-07-26

## 2021-07-19 NOTE — Discharge Instructions (Signed)

## 2021-07-19 NOTE — ED Provider Notes (Signed)
[facility]    CSN: [id number] Arrival date & time: 07/19/21  1425      History   Chief Complaint Chief Complaint  Patient presents with   Dysuria    HPI Karina Leonard is a 80 y.o. female presenting for 4-day history of dysuria, urinary frequency and urgency as well as dark-colored urine with a strong odor.  Patient says she has incontinence issues which have been a little worse since the dysuria began.  Patient believes she may have a UTI.  She has not taken any OTC meds for this.  She denies any fevers, fatigue, abdominal pain, nausea/vomiting.  Does not report any blood in the urine or flank pain.  Medical history significant for hypertension, hyperlipidemia, previous stroke, diabetes.  No other complaints.  HPI  Past Medical History:  Diagnosis Date   Allergy    Cerebrovascular disease    Depression    Diabetes mellitus without complication ([place])    (borderline)   Hyperlipidemia    Hypertension    Sciatica    Stroke [facility]    Urinary incontinence    Wears dentures    full upper and lower    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      Home Medications    Prior to Admission medications   Medication Sig Start Date End Date Taking? Authorizing Provider  sulfamethoxazole-trimethoprim (BACTRIM DS) 800-160 MG tablet Take 1 tablet by mouth 2 (two) times daily for 7 days. 07/19/21 07/26/21 Yes Laurene Footman B, PA-C  cloNIDine (CATAPRES - DOSED IN MG/24 HR) 0.1 mg/24hr patch PLACE 1 PATCH ONTO THE SKIN ONCE A WEEK. 06/24/21   Juline Patch, MD  clopidogrel (PLAVIX) 75 MG tablet Take 1 tablet (75 mg total) by mouth daily. 01/19/21   Juline Patch, MD  cyanocobalamin (,VITAMIN B-12,) 1000 MCG/ML injection shah 02/27/20   [provider]  donepezil (ARICEPT) 5 MG tablet shah Patient not taking: Reported on 01/19/2021 02/24/20   [provider]  fexofenadine (ALLEGRA) 180 MG tablet Take 1 tablet (180 mg total) by mouth daily. otc 07/22/18   Juline Patch, MD  losartan-hydrochlorothiazide (HYZAAR) 100-25 MG tablet Take 1 tablet by mouth daily. 01/19/21   Juline Patch, MD  lovastatin (MEVACOR) 20 MG tablet TAKE 2 TABLETS EVERY DAY 07/15/21   Juline Patch, MD  metoprolol succinate (TOPROL-XL) 100 MG 24 hr tablet Take 1 tablet (100 mg total) by mouth daily. Patient taking differently: Take 50 mg by mouth daily. [place] 01/21/20   Otilio Miu  C, MD  venlafaxine XR (EFFEXOR-XR) 75 MG 24 hr capsule Take 1 capsule (75 mg total) by mouth daily. 01/19/21   Juline Patch, MD    Family History Family History  Problem Relation Age of Onset   Cancer Mother        uterine or ovarian. Unable to remember   Cancer Father        lung   Heart attack Sister     Social History Social History   Tobacco Use   Smoking status: Former    Packs/day: 1.00    Years: 20.00    Pack years: 20.00    Types: Cigarettes    Quit date: 37    Years since quitting:  55.7   Smokeless tobacco: Never  Vaping Use   Vaping Use: Never used  Substance Use Topics   Alcohol use: Yes    Alcohol/week: 0.0 standard drinks    Comment: special occasions   Drug use: No     Allergies   Amoxicillin and Penicillins   Review of Systems Review of Systems  Constitutional:  Negative for chills and fever.  Gastrointestinal:  Negative for abdominal pain, diarrhea, nausea and vomiting.  Genitourinary:  Positive for dysuria, frequency and urgency. Negative for decreased urine volume, flank pain, hematuria, pelvic pain, vaginal bleeding, vaginal discharge and vaginal pain.       Malodorous urine with dark coloration  Musculoskeletal:  Negative for back pain.  Skin:  Negative for rash.    Physical Exam Triage Vital Signs ED Triage Vitals  Enc Vitals Group     BP 07/19/21 1503 (!) 268/78     Pulse Rate 07/19/21 1503 (!) 53     Resp 07/19/21 1503 18     Temp 07/19/21 1503 98.5 F (36.9 C)     Temp Source 07/19/21 1503 Oral     SpO2 07/19/21 1503 99 %     Weight --      Height --      Head Circumference --      Peak Flow --      Pain Score 07/19/21 1500 0     Pain Loc --      Pain Edu? --      Excl. in [place]? --    No data found.  Updated Vital Signs BP (!) 160/90 (BP Location: Left Arm)   Pulse (!) 53   Temp 98.5 F (36.9 C) (Oral)   Resp 18   SpO2 99%      Physical Exam Vitals and nursing note reviewed.  Constitutional:      General: She is not in acute distress.    Appearance: Normal appearance. She is not ill-appearing or toxic-appearing.  HENT:     Head: Normocephalic and atraumatic.  Eyes:     General: No scleral icterus.       Right eye: No discharge.        Left eye: No discharge.     Conjunctiva/sclera: Conjunctivae normal.  Cardiovascular:     Rate and Rhythm: Regular rhythm. Bradycardia present.     Heart sounds: Normal heart sounds.  Pulmonary:     Effort: Pulmonary effort is normal. No respiratory distress.     Breath sounds:  Normal breath sounds.  Abdominal:     Palpations: Abdomen is soft.     Tenderness: There is no abdominal tenderness. There is no right CVA tenderness or left CVA tenderness.  Musculoskeletal:     Cervical back: Neck supple.  Skin:  General: Skin is dry.  Neurological:     General: No focal deficit present.     Mental Status: She is alert. Mental status is at baseline.     Motor: No weakness.     Gait: Gait normal.  Psychiatric:        Mood and Affect: Mood normal.        Behavior: Behavior normal.        Thought Content: Thought content normal.     UC Treatments / Results  Labs (all labs ordered are listed, but only abnormal results are displayed) Labs Reviewed  URINALYSIS, COMPLETE (UACMP) WITH MICROSCOPIC - Abnormal; Notable for the following components:      Result Value   APPearance HAZY (*)    Hgb urine dipstick SMALL (*)    Ketones, ur TRACE (*)    Protein, ur 100 (*)    Leukocytes,Ua LARGE (*)    Bacteria, UA MANY (*)    All other components within normal limits    EKG   Radiology No results found.  Procedures Procedures (including critical care time)  Medications Ordered in UC Medications - No data to display  Initial Impression / Assessment and Plan / UC Course  I have reviewed the triage vital signs and the nursing notes.  Pertinent labs & imaging results that were available during my care of the patient were reviewed by me and considered in my medical decision making (see chart for details).  80 year old female presenting for 4-day history of dysuria, urinary frequency and urgency as well as dark urine with a foul odor.  Patient is afebrile and overall well-appearing.  Her exam is benign.  She has no abdominal tenderness or CVA tenderness.  Chest is clear to auscultation.  Urinalysis obtained today shows: Hazy appearance, small hemoglobin, trace ketones, protein, large leukocytes and many bacteria. Urine sent for culture.  Treating UTI at this  time with Bactrim DS.  Advised to increase rest and fluids.  Reviewed return and ED precautions.  Will amend treatment if needed based on culture result.  Final Clinical Impressions(s) / UC Diagnoses   Final diagnoses:  Acute cystitis with hematuria  Dysuria     Discharge Instructions      UTI: Based on either symptoms or urinalysis, you may have a urinary tract infection. We will send the urine for culture and call with results in a few days. Begin antibiotics at this time. Your symptoms should be much improved over the next 2-3 days. Increase rest and fluid intake. If for some reason symptoms are worsening or not improving after a couple of days or the urine culture determines the antibiotics you are taking will not treat the infection, the antibiotics may be changed. Return or go to ER for fever, back pain, worsening urinary pain, discharge, increased blood in urine. May take Tylenol or Motrin OTC for pain relief or consider AZO if no contraindications      ED Prescriptions     Medication Sig Dispense Auth. Provider   sulfamethoxazole-trimethoprim (BACTRIM DS) 800-160 MG tablet Take 1 tablet by mouth 2 (two) times daily for 7 days. 14 tablet Gretta Cool      PDMP not reviewed this encounter.   Danton Clap, PA-C 07/19/21 1555

## 2021-07-19 NOTE — ED Triage Notes (Signed)
Pt c/o painful urination with urine odor with dark color x 4 days. Denies fever or n/v/d.

## 2021-08-01 ENCOUNTER — Encounter: Payer: Self-pay | Admitting: Family Medicine

## 2021-08-01 ENCOUNTER — Other Ambulatory Visit: Payer: Self-pay

## 2021-08-01 ENCOUNTER — Telehealth: Payer: Self-pay | Admitting: Family Medicine

## 2021-08-01 ENCOUNTER — Ambulatory Visit (INDEPENDENT_AMBULATORY_CARE_PROVIDER_SITE_OTHER): Payer: Medicare HMO | Admitting: Family Medicine

## 2021-08-01 VITALS — BP 120/60 | HR 60 | Ht 67.0 in | Wt 203.0 lb

## 2021-08-01 DIAGNOSIS — F339 Major depressive disorder, recurrent, unspecified: Secondary | ICD-10-CM

## 2021-08-01 DIAGNOSIS — E7849 Other hyperlipidemia: Secondary | ICD-10-CM | POA: Diagnosis not present

## 2021-08-01 DIAGNOSIS — I6789 Other cerebrovascular disease: Secondary | ICD-10-CM

## 2021-08-01 DIAGNOSIS — I10 Essential (primary) hypertension: Secondary | ICD-10-CM | POA: Diagnosis not present

## 2021-08-01 DIAGNOSIS — Z23 Encounter for immunization: Secondary | ICD-10-CM

## 2021-08-01 MED ORDER — CLOPIDOGREL BISULFATE 75 MG PO TABS: 75.0000 mg | ORAL_TABLET | Freq: Every day | ORAL | 1 refills | Status: DC

## 2021-08-01 MED ORDER — LOSARTAN POTASSIUM-HCTZ 100-25 MG PO TABS: 1.0000 | ORAL_TABLET | Freq: Every day | ORAL | 1 refills | Status: DC

## 2021-08-01 MED ORDER — VENLAFAXINE HCL ER 75 MG PO CP24: 75.0000 mg | ORAL_CAPSULE | Freq: Every day | ORAL | 1 refills | Status: DC

## 2021-08-01 MED ORDER — LOVASTATIN 20 MG PO TABS: ORAL_TABLET | ORAL | 1 refills | Status: DC

## 2021-08-01 MED ORDER — CLONIDINE 0.1 MG/24HR TD PTWK: MEDICATED_PATCH | TRANSDERMAL | 0 refills | Status: DC

## 2021-08-01 NOTE — Progress Notes (Signed)
Date:  08/01/2021   Name:  Karina Leonard   DOB:  June 25, 1941   MRN:  [id number]   Chief Complaint: Depression, Hypertension, Hyperlipidemia, Cerebrovascular Accident (Takes plavix), and Flu Vaccine  Depression        This is a chronic problem.  The current episode started more than 1 year ago.   The onset quality is gradual.   The problem occurs intermittently.  The problem has been waxing and waning since onset.  Associated symptoms include decreased interest and sad.  Associated symptoms include no decreased concentration, no helplessness, no hopelessness, does not have insomnia, not irritable, no restlessness, no appetite change, no body aches, no myalgias, no headaches, no indigestion and no suicidal ideas.     The symptoms are aggravated by family issues (loss of husband).  Past treatments include SNRIs - Serotonin and norepinephrine reuptake inhibitors.  Compliance with treatment is good.  Previous treatment provided moderate relief.   Pertinent negatives include , no physical disability, no recent psychiatric admission and no anxiety. Hypertension This is a chronic problem. The current episode started more than 1 year ago. The problem has been gradually improving since onset. The problem is controlled. Pertinent negatives include no anxiety, blurred vision, chest pain, headaches, malaise/fatigue, neck pain, orthopnea, palpitations, peripheral edema, shortness of breath or sweats. There are no associated agents to hypertension. Risk factors for coronary artery disease include dyslipidemia, obesity and family history. Past treatments include angiotensin blockers, diuretics, beta blockers and central alpha agonists. The current treatment provides significant improvement. There are no compliance problems.  Hypertensive end-organ damage includes CVA. There is no history of angina, kidney disease, CAD/MI, heart failure, left ventricular hypertrophy, PVD or retinopathy. There is no history of chronic  renal disease, a hypertension causing med or renovascular disease.  Hyperlipidemia This is a chronic problem. The current episode started more than 1 year ago. The problem is controlled. Recent lipid tests were reviewed and are normal. Exacerbating diseases include obesity. She has no history of chronic renal disease. Pertinent negatives include no chest pain, focal sensory loss, focal weakness, leg pain, myalgias or shortness of breath. Current antihyperlipidemic treatment includes statins. The current treatment provides significant improvement of lipids. There are no compliance problems.  Risk factors for coronary artery disease include diabetes mellitus and hypertension (prediabetes).  Neurologic Problem The patient's primary symptoms include memory loss. The patient's pertinent negatives include no altered mental status, clumsiness, focal sensory loss, focal weakness, loss of balance, near-syncope, slurred speech, syncope, visual change or weakness. This is a chronic problem. The current episode started more than 1 year ago. The problem has been gradually improving since onset. Pertinent negatives include no abdominal pain, back pain, chest pain, dizziness, fever, headaches, nausea, neck pain, palpitations or shortness of breath. The treatment provided moderate relief. Her past medical history is significant for a CVA.   Lab Results  Component Value Date   CREATININE 1.12 (H) 01/19/2021   BUN 20 01/19/2021   NA 143 01/19/2021   K 4.9 01/19/2021   CL 103 01/19/2021   CO2 26 01/19/2021   Lab Results  Component Value Date   CHOL 169 01/19/2021   HDL 58 01/19/2021   LDLCALC 97 01/19/2021   TRIG 73 01/19/2021   CHOLHDL 3.0 03/18/2019   No results found for: TSH Lab Results  Component Value Date   HGBA1C 5.9 (H) 01/19/2021   Lab Results  Component Value Date   WBC 10.8 01/22/2020   HGB 14.2 01/22/2020  HCT 41.1 01/22/2020   MCV 92 01/22/2020   PLT 243 01/22/2020   Lab Results   Component Value Date   ALT 12 01/19/2021   AST 21 01/19/2021   ALKPHOS 109 01/19/2021   BILITOT 0.4 01/19/2021     Review of Systems  Constitutional:  Negative for appetite change, chills, fever and malaise/fatigue.  HENT:  Negative for drooling, ear discharge, ear pain and sore throat.   Eyes:  Negative for blurred vision.  Respiratory:  Negative for cough, shortness of breath and wheezing.   Cardiovascular:  Negative for chest pain, palpitations, orthopnea, leg swelling and near-syncope.  Gastrointestinal:  Negative for abdominal pain, blood in stool, constipation, diarrhea and nausea.  Endocrine: Negative for polydipsia.  Genitourinary:  Negative for dysuria, frequency, hematuria and urgency.  Musculoskeletal:  Negative for back pain, myalgias and neck pain.  Skin:  Negative for rash.  Allergic/Immunologic: Negative for environmental allergies.  Neurological:  Negative for dizziness, focal weakness, syncope, weakness, headaches and loss of balance.  Hematological:  Does not bruise/bleed easily.  Psychiatric/Behavioral:  Positive for depression and memory loss. Negative for decreased concentration and suicidal ideas. The patient is not nervous/anxious and does not have insomnia.    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   Anxiety attack 02/16/2015   Recurrent major depressive episodes ([place]) 02/16/2015   Essential (primary) hypertension 02/16/2015   H/O: HTN (hypertension) 02/16/2015    Asymptomatic hypertensive urgency 11/14/2014   Tubulovillous adenoma of rectum 10/21/2014   Depression 10/21/2014   Nuclear sclerosis of both eyes 07/07/2014    Allergies  Allergen Reactions   Amoxicillin Hives   Donepezil Diarrhea   Penicillins Hives    Past Surgical History:  Procedure Laterality Date   GALLBLADDER SURGERY  10/24/2007   polyp removed     rectum- benign    Social History   Tobacco Use   Smoking status: Former    Packs/day: 1.00    Years: 20.00    Pack years: 20.00    Types: Cigarettes    Quit date: 85    Years since quitting: 55.8   Smokeless tobacco: Never  Vaping Use   Vaping Use: Never used  Substance Use Topics   Alcohol use: Yes    Alcohol/week: 0.0 standard drinks    Comment: special occasions   Drug use: No     Medication list has been reviewed and updated.  Current Meds  Medication Sig   cloNIDine (CATAPRES - DOSED IN MG/24 HR) 0.1 mg/24hr patch PLACE 1 PATCH ONTO THE SKIN ONCE A WEEK.   clopidogrel (PLAVIX) 75 MG tablet Take 1 tablet (75 mg total) by mouth daily.   cyanocobalamin (,VITAMIN B-12,) 1000 MCG/ML injection shah   fexofenadine (ALLEGRA) 180 MG tablet Take 1 tablet (180 mg total) by mouth daily. otc   fluticasone (FLONASE) 50 MCG/ACT nasal spray Place into both nostrils daily. otc   losartan-hydrochlorothiazide (HYZAAR) 100-25 MG tablet Take 1 tablet by mouth daily.   lovastatin (MEVACOR) 20 MG tablet TAKE 2 TABLETS EVERY DAY   metoprolol succinate (TOPROL-XL) 100  MG 24 hr tablet Take 1 tablet (100 mg total) by mouth daily. (Patient taking differently: Take 50 mg by mouth daily. [place])   venlafaxine XR (EFFEXOR-XR) 75 MG 24 hr capsule Take 1 capsule (75 mg total) by mouth daily.    PHQ 2/9 Scores 08/01/2021 01/19/2021 04/12/2020 03/18/2020  PHQ - 2 Score 1 1 0 0  PHQ- 9 Score 2 2 1  0    GAD 7 : Generalized Anxiety Score 01/19/2021 04/12/2020 03/18/2020 03/08/2020  Nervous, Anxious, on Edge 1 0 0 0  Control/stop worrying 2  0 0 0  Worry too much - different things 2 0 0 0  Trouble relaxing 0 0 0 0  Restless 0 0 0 0  Easily annoyed or irritable 0 0 0 0  Afraid - awful might happen 1 0 0 0  Total GAD 7 Score 6 0 0 0  Anxiety Difficulty Somewhat difficult Not difficult at all - Not difficult at all    BP Readings from Last 3 Encounters:  08/01/21 120/60  07/19/21 (!) 160/90  01/19/21 128/80    Physical Exam Constitutional:      General: She is not irritable. HENT:     Head: Normocephalic.     Right Ear: Tympanic membrane and ear canal normal.     Left Ear: Tympanic membrane and ear canal normal.     Nose: Nose normal.     Mouth/Throat:     Mouth: Mucous membranes are moist.  Eyes:     Pupils: Pupils are equal, round, and reactive to light.  Cardiovascular:     Heart sounds: S1 normal and S2 normal. No murmur heard. No systolic murmur is present.  No diastolic murmur is present.    No friction rub. No gallop. No S3 or S4 sounds.  Pulmonary:     Breath sounds: No wheezing or rhonchi.  Abdominal:     Palpations: There is no mass.     Tenderness: There is no abdominal tenderness.  Musculoskeletal:     Cervical back: Neck supple.  Neurological:     Cranial Nerves: Cranial nerves are intact.     Sensory: Sensation is intact.     Motor: Motor function is intact.    Wt Readings from Last 3 Encounters:  08/01/21 203 lb (92.1 kg)  01/19/21 210 lb (95.3 kg)  07/22/20 202 lb (91.6 kg)    BP 120/60   Pulse 60   Ht 5\' 7"  (1.702 m)   Wt 203 lb (92.1 kg)   BMI 31.79 kg/m   Assessment and Plan:  1. Essential hypertension Chronic.  Stable.  Controlled.  Patient will continue Catapres 0.1mg /h.  Patient uses 1 patch a week.  In addition she will continue losartan hydrochlorothiazide [phone] mg as well as metoprolol XL 100 mg once a day and low-sodium intake. - cloNIDine (CATAPRES - DOSED IN MG/24 HR) 0.1 mg/24hr patch; PLACE 1 PATCH ONTO THE SKIN ONCE A WEEK.  Dispense: 12 patch; Refill: 0  2.  Cerebral microvascular disease Chronic.  Stable.  No progression of disease and no episodes of CVAs.  Patient will continue Plavix 75 mg once a day. - clopidogrel (PLAVIX) 75 MG tablet; Take 1 tablet (75 mg total) by mouth daily.  Dispense: 90 tablet; Refill: 1  3. Essential (primary) hypertension As noted above chronic.  Stable.  Controlled.  With a blood pressure today of 120/60.  We will continue quadruple therapy of Catapres metoprolol losartan and hydrochlorothiazide. - losartan-hydrochlorothiazide (HYZAAR) 100-25 MG tablet; Take  1 tablet by mouth daily.  Dispense: 90 tablet; Refill: 1  4. Familial multiple lipoprotein-type hyperlipidemia Chronic.  Controlled.  Stable.  Continue lovastatin 20 mg 2 tablets daily. - lovastatin (MEVACOR) 20 MG tablet; TAKE 2 TABLETS EVERY DAY  Dispense: 180 tablet; Refill: 1  5. Recurrent major depressive episodes (HCC) Chronic.  Controlled.  Stable.  PHQ is 2 Gad score is 0.  Continue venlafaxine XR 75 mg 1 capsule daily. - venlafaxine XR (EFFEXOR-XR) 75 MG 24 hr capsule; Take 1 capsule (75 mg total) by mouth daily.  Dispense: 90 capsule; Refill: 1  6. Need for immunization against influenza Discussed and administered. - Flu Vaccine QUAD High Dose(Fluad)

## 2021-08-01 NOTE — Telephone Encounter (Signed)
Karina Leonard pt called and forgot what she was suppose to bring to her 10:00 appt. I looked in the pt chart but didn't see anything. I explained to the pec that we couldn't find out what the pt should bring, and explained that you were at an appt and would return soon.

## 2021-08-24 ENCOUNTER — Ambulatory Visit: Payer: Medicare HMO

## 2021-09-23 ENCOUNTER — Telehealth: Payer: Self-pay | Admitting: Family Medicine

## 2021-09-23 NOTE — Telephone Encounter (Signed)
Copied from [place] [id number]. Topic: Medicare AWV >> Sep 23, 2021 11:29 AM Cher Nakai R wrote: Reason for CRM:  Left message for patient to call back and schedule Medicare Annual Wellness Visit (AWV) in office.   If unable to come into the office for AWV,  please offer to do virtually or by telephone.  Last AWV:  02/04/2020  Please schedule at anytime with [facility] Health Advisor.      40 Minutes appointment   Any questions, please call me at [phone]

## 2021-09-23 NOTE — Telephone Encounter (Signed)
Pt returned call.  She would like for someone to call her back at [phone]

## 2021-09-29 ENCOUNTER — Telehealth: Payer: Self-pay | Admitting: Family Medicine

## 2021-09-29 NOTE — Telephone Encounter (Signed)
Copied from [place] [id number]. Topic: Medicare AWV >> Sep 29, 2021  9:12 AM Cher Nakai R wrote: Reason for CRM:  Left message for patient to call back and schedule Medicare Annual Wellness Visit (AWV) in office.   If unable to come into the office for AWV,  please offer to do virtually or by telephone.  Last AWV: 01/24/2018  Please schedule at anytime with [facility] Health Advisor.      40 Minutes appointment   Any questions, please call me at [phone]

## 2021-11-02 ENCOUNTER — Telehealth: Payer: Self-pay | Admitting: Family Medicine

## 2021-11-02 NOTE — Telephone Encounter (Signed)
Copied from [place] [id number]. Topic: Medicare AWV >> Nov 02, 2021  9:49 AM Cher Nakai R wrote: Reason for CRM:  Left message for patient to call back and schedule Medicare Annual Wellness Visit (AWV) in office.   If unable to come into the office for AWV,  please offer to do virtually or by telephone.  Last AWV: : 01/24/2018  Please schedule at anytime with [facility] Health Advisor.      40 Minutes appointment   Any questions, please call me at [phone]

## 2021-11-15 ENCOUNTER — Ambulatory Visit
Admission: EM | Admit: 2021-11-15 | Discharge: 2021-11-15 | Disposition: A | Discharge status: Home / Self Care | Payer: Medicare Other | Attending: Emergency Medicine | Admitting: Emergency Medicine

## 2021-11-15 ENCOUNTER — Other Ambulatory Visit: Payer: Self-pay

## 2021-11-15 DIAGNOSIS — N390 Urinary tract infection, site not specified: Secondary | ICD-10-CM | POA: Diagnosis not present

## 2021-11-15 LAB — URINALYSIS, COMPLETE (UACMP) WITH MICROSCOPIC
Bilirubin Urine: NEGATIVE
Glucose, UA: NEGATIVE mg/dL
Ketones, ur: NEGATIVE mg/dL
Nitrite: NEGATIVE
Protein, ur: >300 mg/dL — AB
Specific Gravity, Urine: 1.025 (ref 1.005–1.030)
pH: 5.5 (ref 5.0–8.0)

## 2021-11-15 MED ORDER — PHENAZOPYRIDINE HCL 200 MG PO TABS: 200.0000 mg | ORAL_TABLET | Freq: Three times a day (TID) | ORAL | 0 refills | Status: DC

## 2021-11-15 MED ORDER — NITROFURANTOIN MONOHYD MACRO 100 MG PO CAPS: 100.0000 mg | ORAL_CAPSULE | Freq: Two times a day (BID) | ORAL | 0 refills | Status: DC

## 2021-11-15 NOTE — ED Provider Notes (Signed)
[facility]    CSN: [id number] Arrival date & time: 11/15/21  1440      History   Chief Complaint Chief Complaint  Patient presents with   Dysuria    HPI AGNIESZKA NEWHOUSE is a 81 y.o. female.   HPI  81 year old female here for evaluation of urinary complaints.  Patient reports that she has been experiencing painful urination for the past week along with urinary urgency and frequency.  She also was experiencing urinary incontinence.  She expresses that she does experience incontinence on a regular basis as well.  She denies any nocturia, blood in her urine, fever, or abdominal pain.  She does endorse some low back pain.  Past Medical History:  Diagnosis Date   Allergy    Cerebrovascular disease    Depression    Diabetes mellitus without complication ([place])    (borderline)   Hyperlipidemia    Hypertension    Sciatica    Stroke [facility])    Urinary incontinence    Wears dentures    full upper and lower    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   Anxiety attack 02/16/2015   Recurrent major depressive episodes ([place]) 02/16/2015   Essential (primary) hypertension 02/16/2015   H/O: HTN (hypertension) 02/16/2015   Asymptomatic hypertensive urgency 11/14/2014   Tubulovillous adenoma of rectum 10/21/2014   Depression 10/21/2014   Nuclear sclerosis of both eyes 07/07/2014    Past Surgical History:  Procedure Laterality Date   GALLBLADDER SURGERY  10/24/2007   polyp removed      rectum- benign    OB History   No obstetric history on file.      Home Medications    Prior to Admission medications   Medication Sig Start Date End Date Taking? Authorizing Provider  nitrofurantoin, macrocrystal-monohydrate, (MACROBID) 100 MG capsule Take 1 capsule (100 mg total) by mouth 2 (two) times daily. 11/15/21  Yes Margarette Canada, NP  phenazopyridine (PYRIDIUM) 200 MG tablet Take 1 tablet (200 mg total) by mouth 3 (three) times daily. 11/15/21  Yes Margarette Canada, NP  cloNIDine (CATAPRES - DOSED IN MG/24 HR) 0.1 mg/24hr patch PLACE 1 PATCH ONTO THE SKIN ONCE A WEEK. 08/01/21   Juline Patch, MD  clopidogrel (PLAVIX) 75 MG tablet Take 1 tablet (75 mg total) by mouth daily. 08/01/21   Juline Patch, MD  cyanocobalamin (,VITAMIN B-12,) 1000 MCG/ML injection shah 02/27/20   [provider]  fexofenadine (ALLEGRA) 180 MG tablet Take 1 tablet (180 mg total) by mouth daily. otc 07/22/18   Juline Patch, MD  fluticasone (FLONASE) 50 MCG/ACT nasal spray Place into both nostrils daily. otc    [provider]  losartan-hydrochlorothiazide (HYZAAR) 100-25 MG tablet Take 1 tablet by mouth daily. 08/01/21   Juline Patch, MD  lovastatin (MEVACOR) 20 MG tablet TAKE 2 TABLETS EVERY DAY 08/01/21   Juline Patch, MD  metoprolol succinate (TOPROL-XL) 100 MG 24 hr tablet Take 1 tablet (100 mg total) by mouth daily. Patient taking differently: Take 50 mg by mouth daily.  [place] 01/21/20   Juline Patch, MD  venlafaxine XR (EFFEXOR-XR) 75 MG 24 hr capsule Take 1 capsule (75 mg total) by mouth daily. 08/01/21   Juline Patch, MD    Family History Family History  Problem Relation Age of Onset   Cancer Mother        uterine or ovarian. Unable to remember   Cancer Father        lung   Heart attack Sister     Social History Social History   Tobacco Use   Smoking status: Former    Packs/day: 1.00    Years: 20.00    Pack years: 20.00    Types: Cigarettes    Quit  date: 45    Years since quitting: 56.1   Smokeless tobacco: Never  Vaping Use   Vaping Use: Never used  Substance Use Topics   Alcohol use: Yes    Alcohol/week: 0.0 standard drinks    Comment: special occasions   Drug use: No     Allergies   Amoxicillin, Donepezil, and Penicillins   Review of Systems Review of Systems  Gastrointestinal:  Negative for abdominal pain.  Genitourinary:  Positive for dysuria, frequency and urgency. Negative for hematuria.  Musculoskeletal:  Positive for back pain.  Hematological: Negative.   Psychiatric/Behavioral: Negative.      Physical Exam Triage Vital Signs ED Triage Vitals  Enc Vitals Group     BP --      Pulse Rate 11/15/21 1646 62     Resp 11/15/21 1646 16     Temp 11/15/21 1646 98.1 F (36.7 C)     Temp Source 11/15/21 1646 Oral     SpO2 11/15/21 1646 99 %     Weight --      Height --      Head Circumference --      Peak Flow --      Pain Score 11/15/21 1645 0     Pain Loc --      Pain Edu? --      Excl. in [place]? --    No data found.  Updated Vital Signs BP (!) 142/86 (BP Location: Left Arm)    Pulse 62    Temp 98.1 F (36.7 C) (Oral)    Resp 16    SpO2 99%   Visual Acuity Right Eye Distance:   Left Eye Distance:   Bilateral Distance:    Right Eye Near:   Left Eye Near:    Bilateral Near:     Physical Exam Vitals and nursing note reviewed.  Constitutional:      General: She is not in acute distress.    Appearance: Normal appearance. She is normal weight. She is not ill-appearing.  HENT:     Head: Normocephalic and atraumatic.  Cardiovascular:     Rate and Rhythm: Normal rate and regular rhythm.     Pulses: Normal pulses.     Heart sounds: Normal heart sounds. No murmur heard.   No friction rub. No gallop.  Pulmonary:     Effort: Pulmonary effort is normal.     Breath sounds: Normal breath sounds. No wheezing, rhonchi or rales.  Abdominal:     Tenderness: There is no right CVA tenderness or left CVA  tenderness.  Skin:    General: Skin is warm and dry.     Capillary Refill: Capillary refill takes less than 2 seconds.     Findings: No erythema or rash.  Neurological:  General: No focal deficit present.     Mental Status: She is alert and oriented to person, place, and time.  Psychiatric:        Mood and Affect: Mood normal.        Behavior: Behavior normal.        Thought Content: Thought content normal.        Judgment: Judgment normal.     UC Treatments / Results  Labs (all labs ordered are listed, but only abnormal results are displayed) Labs Reviewed  URINALYSIS, COMPLETE (UACMP) WITH MICROSCOPIC - Abnormal; Notable for the following components:      Result Value   APPearance CLOUDY (*)    Hgb urine dipstick SMALL (*)    Protein, ur >300 (*)    Leukocytes,Ua LARGE (*)    Bacteria, UA MANY (*)    All other components within normal limits  URINE CULTURE    EKG   Radiology No results found.  Procedures Procedures (including critical care time)  Medications Ordered in UC Medications - No data to display  Initial Impression / Assessment and Plan / UC Course  I have reviewed the triage vital signs and the nursing notes.  Pertinent labs & imaging results that were available during my care of the patient were reviewed by me and considered in my medical decision making (see chart for details).  Patient is a nontoxic-appearing 81 year old female here for evaluation of urinary complaints that consist of dysuria, urinary frequency, urinary urgency, and incontinence.  She is also endorsing some low back pain.  All the symptoms of been present for the past week.  On exam patient is a benign cardiopulmonary exam clear lung sounds in all fields.  No CVA tenderness on exam.  Patient does not endorse any abdominal pain.  Will collect urinalysis and send for evaluation.  Urinalysis shows small hemoglobin, greater than 300 protein, large leukocytes, 11-20 squamous epithelials  and 11-20 WBCs, 6-10 RBC, and many bacteria.  No nitrites.  We will send urine for culture.  Will discharge patient home and treated for UTI with Macrobid twice daily for 5 days.  We will also give Pyridium to help with urinary discomfort.   Final Clinical Impressions(s) / UC Diagnoses   Final diagnoses:  Lower urinary tract infectious disease     Discharge Instructions      Take the Macrobid twice daily for 5 days with food for treatment of urinary tract infection.  Use the Pyridium every 8 hours as needed for urinary discomfort.  This will turn your urine a bright red-orange.  Increase your oral fluid intake so that you increase your urine production and or flushing your urinary system.  Take an over-the-counter probiotic, such as Culturelle-Align-Activia, 1 hour after each dose of antibiotic to prevent diarrhea or yeast infections from forming.  We will culture urine and change the antibiotics if necessary.  Return for reevaluation, or see your primary care provider, for any new or worsening symptoms.      ED Prescriptions     Medication Sig Dispense Auth. Provider   nitrofurantoin, macrocrystal-monohydrate, (MACROBID) 100 MG capsule Take 1 capsule (100 mg total) by mouth 2 (two) times daily. 10 capsule Margarette Canada, NP   phenazopyridine (PYRIDIUM) 200 MG tablet Take 1 tablet (200 mg total) by mouth 3 (three) times daily. 6 tablet Margarette Canada, NP      PDMP not reviewed this encounter.   Margarette Canada, NP 11/15/21 1743

## 2021-11-15 NOTE — Discharge Instructions (Addendum)

## 2021-11-15 NOTE — ED Triage Notes (Signed)
Patient presents to Urgent Care with complaints of possible UTI, she c/o of dysuria x 1 week ago.   Denies fever or hematuria.

## 2021-11-17 LAB — URINE CULTURE: Culture: 40000 — AB

## 2021-11-28 ENCOUNTER — Telehealth: Payer: Self-pay | Admitting: Family Medicine

## 2021-11-28 ENCOUNTER — Other Ambulatory Visit: Payer: Self-pay

## 2021-11-28 ENCOUNTER — Telehealth: Payer: Self-pay

## 2021-11-28 DIAGNOSIS — I6789 Other cerebrovascular disease: Secondary | ICD-10-CM

## 2021-11-28 DIAGNOSIS — F339 Major depressive disorder, recurrent, unspecified: Secondary | ICD-10-CM

## 2021-11-28 DIAGNOSIS — E7849 Other hyperlipidemia: Secondary | ICD-10-CM

## 2021-11-28 DIAGNOSIS — I10 Essential (primary) hypertension: Secondary | ICD-10-CM

## 2021-11-28 MED ORDER — CLONIDINE 0.1 MG/24HR TD PTWK: MEDICATED_PATCH | TRANSDERMAL | 0 refills | Status: DC

## 2021-11-28 MED ORDER — VENLAFAXINE HCL ER 75 MG PO CP24: 75.0000 mg | ORAL_CAPSULE | Freq: Every day | ORAL | 0 refills | Status: DC

## 2021-11-28 MED ORDER — LOSARTAN POTASSIUM-HCTZ 100-25 MG PO TABS: 1.0000 | ORAL_TABLET | Freq: Every day | ORAL | 0 refills | Status: DC

## 2021-11-28 MED ORDER — CLOPIDOGREL BISULFATE 75 MG PO TABS: 75.0000 mg | ORAL_TABLET | Freq: Every day | ORAL | 0 refills | Status: DC

## 2021-11-28 MED ORDER — LOVASTATIN 20 MG PO TABS: ORAL_TABLET | ORAL | 0 refills | Status: DC

## 2021-11-28 NOTE — Telephone Encounter (Signed)
I called pt back and she stated her insurance has changed and therefore she needed her meds sent to a new mail order company. She is now using [facility] mail [facility]. I sent in the 5 Rxs for 90 days- we will see her at appt in April

## 2021-11-28 NOTE — Telephone Encounter (Signed)
Copied from [place] [phone]. Topic: General - Other >> Nov 28, 2021 11:55 AM Valere Dross wrote: Reason for CRM: Pt called in stating she wanted to speak with Baxter Flattery about her insurance and how its changed and what insurance her medication should be going to as far as mail services.

## 2021-11-30 ENCOUNTER — Telehealth: Payer: Self-pay

## 2021-11-30 NOTE — Telephone Encounter (Signed)
Called [place] and talked to her about clonidine denial d/t pt's age. Also got her an appt with Dr. Clayborn Bigness for him to prescribe the med on 12/01/21 @ 3:00 in [place]. Left message on pt's machine with info and spoke to daughter concerning the issue as well as the appt date and time

## 2021-11-30 NOTE — Telephone Encounter (Signed)
Pt requested a call back from Karina Leonard, stating that she did not understand the message that was left and would like to speak with her.

## 2021-12-01 DIAGNOSIS — Z8673 Personal history of transient ischemic attack (TIA), and cerebral infarction without residual deficits: Secondary | ICD-10-CM | POA: Diagnosis not present

## 2021-12-01 DIAGNOSIS — R0602 Shortness of breath: Secondary | ICD-10-CM | POA: Diagnosis not present

## 2021-12-01 DIAGNOSIS — I2510 Atherosclerotic heart disease of native coronary artery without angina pectoris: Secondary | ICD-10-CM | POA: Diagnosis not present

## 2021-12-01 DIAGNOSIS — R001 Bradycardia, unspecified: Secondary | ICD-10-CM | POA: Diagnosis not present

## 2022-01-31 ENCOUNTER — Ambulatory Visit (INDEPENDENT_AMBULATORY_CARE_PROVIDER_SITE_OTHER): Payer: Medicare Other | Admitting: Family Medicine

## 2022-01-31 ENCOUNTER — Encounter: Payer: Self-pay | Admitting: Family Medicine

## 2022-01-31 VITALS — BP 128/80 | HR 68 | Ht 67.0 in | Wt 190.0 lb

## 2022-01-31 DIAGNOSIS — I6789 Other cerebrovascular disease: Secondary | ICD-10-CM | POA: Diagnosis not present

## 2022-01-31 DIAGNOSIS — F339 Major depressive disorder, recurrent, unspecified: Secondary | ICD-10-CM

## 2022-01-31 DIAGNOSIS — R739 Hyperglycemia, unspecified: Secondary | ICD-10-CM

## 2022-01-31 DIAGNOSIS — E7849 Other hyperlipidemia: Secondary | ICD-10-CM | POA: Diagnosis not present

## 2022-01-31 DIAGNOSIS — I10 Essential (primary) hypertension: Secondary | ICD-10-CM | POA: Diagnosis not present

## 2022-01-31 DIAGNOSIS — R634 Abnormal weight loss: Secondary | ICD-10-CM

## 2022-01-31 MED ORDER — LOVASTATIN 20 MG PO TABS: ORAL_TABLET | ORAL | 1 refills | Status: DC

## 2022-01-31 MED ORDER — LOSARTAN POTASSIUM-HCTZ 100-25 MG PO TABS: 1.0000 | ORAL_TABLET | Freq: Every day | ORAL | 1 refills | Status: DC

## 2022-01-31 MED ORDER — CLOPIDOGREL BISULFATE 75 MG PO TABS: 75.0000 mg | ORAL_TABLET | Freq: Every day | ORAL | 1 refills | Status: DC

## 2022-01-31 MED ORDER — VENLAFAXINE HCL ER 75 MG PO CP24: 75.0000 mg | ORAL_CAPSULE | Freq: Every day | ORAL | 1 refills | Status: DC

## 2022-01-31 MED ORDER — CLONIDINE 0.1 MG/24HR TD PTWK: MEDICATED_PATCH | TRANSDERMAL | 1 refills | Status: DC

## 2022-01-31 NOTE — Progress Notes (Signed)
? ? ?Date:  01/31/2022  ? ?Name:  Karina Leonard   DOB:  [birth year]   MRN:  [id number] ? ? ?Chief Complaint: Hypertension, Hyperlipidemia, Depression, Coronary Artery Disease (Takes plavix), and Prediabetes ? ?Hypertension ?This is a chronic problem. The current episode started more than 1 year ago. The problem has been waxing and waning since onset. The problem is controlled. Pertinent negatives include no anxiety, blurred vision, chest pain, headaches, malaise/fatigue, neck pain, orthopnea, palpitations, peripheral edema, PND, shortness of breath or sweats. There are no associated agents to hypertension. Risk factors for coronary artery disease include dyslipidemia. Past treatments include angiotensin blockers, diuretics and central alpha agonists. The current treatment provides moderate improvement. There are no compliance problems.  There is no history of angina, kidney disease, CAD/MI, CVA, heart failure, left ventricular hypertrophy, PVD or retinopathy. There is no history of chronic renal disease, a hypertension causing med or renovascular disease.  ?Hyperlipidemia ?This is a chronic problem. The current episode started more than 1 year ago. The problem is controlled. Recent lipid tests were reviewed and are normal. She has no history of chronic renal disease, diabetes, hypothyroidism, liver disease, obesity or nephrotic syndrome. Pertinent negatives include no chest pain, myalgias or shortness of breath. Current antihyperlipidemic treatment includes statins. The current treatment provides moderate improvement of lipids. There are no compliance problems.   ?Depression ?       This is a chronic problem.  The current episode started more than 1 year ago.   The problem has been gradually improving since onset.  Associated symptoms include no decreased concentration, no fatigue, no helplessness, no hopelessness, does not have insomnia, not irritable, no restlessness, no decreased interest, no appetite change, no  body aches, no myalgias, no headaches, no indigestion, not sad and no suicidal ideas.  Past treatments include SNRIs - Serotonin and norepinephrine reuptake inhibitors.  Compliance with treatment is good.   Pertinent negatives include no hypothyroidism and no anxiety. ?Coronary Artery Disease ?Pertinent negatives include no chest pain, dizziness, leg swelling, palpitations or shortness of breath. Risk factors include hyperlipidemia and hypertension. Risk factors do not include obesity.  ? ?Lab Results  ?Component Value Date  ? NA 143 01/19/2021  ? K 4.9 01/19/2021  ? CO2 26 01/19/2021  ? GLUCOSE 109 (H) 01/19/2021  ? BUN 20 01/19/2021  ? CREATININE 1.12 (H) 01/19/2021  ? CALCIUM 9.1 01/19/2021  ? EGFR 51 (L) 01/19/2021  ? GFRNONAA 52 (L) 01/22/2020  ? ?Lab Results  ?Component Value Date  ? CHOL 169 01/19/2021  ? HDL 58 01/19/2021  ? [place] 97 01/19/2021  ? TRIG 73 01/19/2021  ? CHOLHDL 3.0 03/18/2019  ? ?No results found for: TSH ?Lab Results  ?Component Value Date  ? HGBA1C 5.9 (H) 01/19/2021  ? ?Lab Results  ?Component Value Date  ? WBC 10.8 01/22/2020  ? HGB 14.2 01/22/2020  ? HCT 41.1 01/22/2020  ? MCV 92 01/22/2020  ? PLT 243 01/22/2020  ? ?Lab Results  ?Component Value Date  ? ALT 12 01/19/2021  ? AST 21 01/19/2021  ? ALKPHOS 109 01/19/2021  ? BILITOT 0.4 01/19/2021  ? ?No results found for: 25OHVITD2, [place], VD25OH  ? ?Review of Systems  ?Constitutional:  Negative for appetite change, chills, fatigue, fever and malaise/fatigue.  ?HENT:  Negative for drooling, ear discharge, ear pain and sore throat.   ?Eyes:  Negative for blurred vision.  ?Respiratory:  Negative for cough, shortness of breath and wheezing.   ?Cardiovascular:  Negative for chest  pain, palpitations, orthopnea, leg swelling and PND.  ?Gastrointestinal:  Negative for abdominal pain, blood in stool, constipation, diarrhea and nausea.  ?Endocrine: Negative for polydipsia.  ?Genitourinary:  Negative for dysuria, frequency, hematuria and  urgency.  ?Musculoskeletal:  Negative for back pain, myalgias and neck pain.  ?Skin:  Negative for rash.  ?Allergic/Immunologic: Negative for environmental allergies.  ?Neurological:  Negative for dizziness and headaches.  ?Hematological:  Does not bruise/bleed easily.  ?Psychiatric/Behavioral:  Positive for depression. Negative for decreased concentration and suicidal ideas. The patient is not nervous/anxious and does not have insomnia.   ? ?Patient Active Problem List  ? Diagnosis Date Noted  ? Urinary incontinence 07/19/2021  ? Need for vaccination 03/18/2019  ? H/O hypercholesterolemia 03/18/2019  ? Drug-induced obesity 03/18/2019  ? Coronary artery disease 03/18/2019  ? Cataract cortical, senile 03/18/2019  ? Taking medication for chronic disease 03/18/2019  ? Numbness and tingling of right upper extremity 08/15/2016  ? Lipoma of right upper extremity 08/15/2016  ? Cerebral microvascular disease 07/23/2015  ? Familial multiple lipoprotein-type hyperlipidemia 02/16/2015  ? Anxiety attack 02/16/2015  ? Recurrent major depressive episodes ([place]) 02/16/2015  ? Essential (primary) hypertension 02/16/2015  ? H/O: HTN (hypertension) 02/16/2015  ? Asymptomatic hypertensive urgency 11/14/2014  ? Tubulovillous adenoma of rectum 10/21/2014  ? Depression 10/21/2014  ? Nuclear sclerosis of both eyes 07/07/2014  ? ? ?Allergies  ?Allergen Reactions  ? Amoxicillin Hives  ? Donepezil Diarrhea  ? Penicillins Hives  ? ? ?Past Surgical History:  ?Procedure Laterality Date  ? GALLBLADDER SURGERY  10/24/2007  ? polyp removed    ? rectum- benign  ? ? ?Social History  ? ?Tobacco Use  ? Smoking status: Former  ?  Packs/day: 1.00  ?  Years: 20.00  ?  Pack years: 20.00  ?  Types: Cigarettes  ?  Quit date: 76  ?  Years since quitting: 56.3  ? Smokeless tobacco: Never  ?Vaping Use  ? Vaping Use: Never used  ?Substance Use Topics  ? Alcohol use: Yes  ?  Alcohol/week: 0.0 standard drinks  ?  Comment: special occasions  ? Drug use: No   ? ? ? ?Medication list has been reviewed and updated. ? ?Current Meds  ?Medication Sig  ? cloNIDine (CATAPRES - DOSED IN MG/24 HR) 0.1 mg/24hr patch PLACE 1 PATCH ONTO THE SKIN ONCE A WEEK.  ? clopidogrel (PLAVIX) 75 MG tablet Take 1 tablet (75 mg total) by mouth daily.  ? cyanocobalamin (,VITAMIN B-12,) 1000 MCG/ML injection shah  ? fexofenadine (ALLEGRA) 180 MG tablet Take 1 tablet (180 mg total) by mouth daily. otc  ? fluticasone (FLONASE) 50 MCG/ACT nasal spray Place into both nostrils daily. otc  ? losartan-hydrochlorothiazide (HYZAAR) 100-25 MG tablet Take 1 tablet by mouth daily.  ? lovastatin (MEVACOR) 20 MG tablet TAKE 2 TABLETS EVERY DAY  ? metoprolol succinate (TOPROL-XL) 100 MG 24 hr tablet Take 1 tablet (100 mg total) by mouth daily. (Patient taking differently: Take 50 mg by mouth daily. [place])  ? venlafaxine XR (EFFEXOR-XR) 75 MG 24 hr capsule Take 1 capsule (75 mg total) by mouth daily.  ? ? ? ?  01/31/2022  ? 10:09 AM 01/19/2021  ? 11:03 AM 04/12/2020  ?  3:02 PM 03/18/2020  ? 10:14 AM  ?GAD 7 : Generalized Anxiety Score  ?Nervous, Anxious, on Edge 0 1 0 0  ?Control/stop worrying 0 2 0 0  ?Worry too much - different things 0 2 0 0  ?Trouble relaxing 0 0  0 0  ?Restless 0 0 0 0  ?Easily annoyed or irritable 0 0 0 0  ?Afraid - awful might happen 0 1 0 0  ?Total GAD 7 Score 0 6 0 0  ?Anxiety Difficulty Not difficult at all Somewhat difficult Not difficult at all   ? ? ? ?  01/31/2022  ? 10:09 AM  ?Depression screen PHQ 2/9  ?Decreased Interest 0  ?Down, Depressed, Hopeless 0  ?PHQ - 2 Score 0  ?Altered sleeping 0  ?Tired, decreased energy 0  ?Change in appetite 0  ?Feeling bad or failure about yourself  0  ?Trouble concentrating 0  ?Moving slowly or fidgety/restless 0  ?Suicidal thoughts 0  ?PHQ-9 Score 0  ?Difficult doing work/chores Not difficult at all  ? ? ?BP Readings from Last 3 Encounters:  ?01/31/22 128/80  ?11/15/21 (!) 142/86  ?08/01/21 120/60  ? ? ?Physical Exam ?Vitals and nursing note  reviewed.  ?Constitutional:   ?   General: She is not irritable. ?   Appearance: She is well-developed.  ?HENT:  ?   Head: Normocephalic.  ?   Right Ear: Tympanic membrane and external ear normal.  ?   Left Ear: Tympanic m

## 2022-02-01 LAB — CBC WITH DIFFERENTIAL/PLATELET
Basophils Absolute: 0.1 10*3/uL (ref 0.0–0.2)
Basos: 1 %
EOS (ABSOLUTE): 0.2 10*3/uL (ref 0.0–0.4)
Eos: 1 %
Hematocrit: 37.8 % (ref 34.0–46.6)
Hemoglobin: 12.5 g/dL (ref 11.1–15.9)
Immature Grans (Abs): 0 10*3/uL (ref 0.0–0.1)
Immature Granulocytes: 0 %
Lymphocytes Absolute: 2 10*3/uL (ref 0.7–3.1)
Lymphs: 16 %
MCH: 30.3 pg (ref 26.6–33.0)
MCHC: 33.1 g/dL (ref 31.5–35.7)
MCV: 92 fL (ref 79–97)
Monocytes Absolute: 0.5 10*3/uL (ref 0.1–0.9)
Monocytes: 4 %
Neutrophils Absolute: 9.5 10*3/uL — ABNORMAL HIGH (ref 1.4–7.0)
Neutrophils: 78 %
Platelets: 214 10*3/uL (ref 150–450)
RBC: 4.12 x10E6/uL (ref 3.77–5.28)
RDW: 12.6 % (ref 11.7–15.4)
WBC: 12.2 10*3/uL — ABNORMAL HIGH (ref 3.4–10.8)

## 2022-02-01 LAB — COMPREHENSIVE METABOLIC PANEL
ALT: 6 IU/L (ref 0–32)
AST: 15 IU/L (ref 0–40)
Albumin/Globulin Ratio: 1.5 (ref 1.2–2.2)
Albumin: 3.9 g/dL (ref 3.7–4.7)
Alkaline Phosphatase: 102 IU/L (ref 44–121)
BUN/Creatinine Ratio: 14 (ref 12–28)
BUN: 20 mg/dL (ref 8–27)
Bilirubin Total: 0.4 mg/dL (ref 0.0–1.2)
CO2: 28 mmol/L (ref 20–29)
Calcium: 9.2 mg/dL (ref 8.7–10.3)
Chloride: 105 mmol/L (ref 96–106)
Creatinine, Ser: 1.38 mg/dL — ABNORMAL HIGH (ref 0.57–1.00)
Globulin, Total: 2.6 g/dL (ref 1.5–4.5)
Glucose: 104 mg/dL — ABNORMAL HIGH (ref 70–99)
Potassium: 4.7 mmol/L (ref 3.5–5.2)
Sodium: 146 mmol/L — ABNORMAL HIGH (ref 134–144)
Total Protein: 6.5 g/dL (ref 6.0–8.5)
eGFR: 39 mL/min/{1.73_m2} — ABNORMAL LOW (ref >59)

## 2022-02-01 LAB — HEMOGLOBIN A1C
Est. average glucose Bld gHb Est-mCnc: 117 mg/dL
Hgb A1c MFr Bld: 5.7 % — ABNORMAL HIGH (ref 4.8–5.6)

## 2022-02-01 LAB — LIPID PANEL WITH LDL/HDL RATIO
Cholesterol, Total: 143 mg/dL (ref 100–199)
HDL: 56 mg/dL (ref >39)
LDL Chol Calc (NIH): 75 mg/dL (ref 0–99)
LDL/HDL Ratio: 1.3 ratio (ref 0.0–3.2)
Triglycerides: 55 mg/dL (ref 0–149)
VLDL Cholesterol Cal: 12 mg/dL (ref 5–40)

## 2022-02-01 LAB — HEPATIC FUNCTION PANEL (6): Bilirubin, Direct: 0.12 mg/dL (ref 0.00–0.40)

## 2022-02-14 ENCOUNTER — Telehealth: Payer: Self-pay | Admitting: Family Medicine

## 2022-02-14 NOTE — Telephone Encounter (Signed)
Copied from [place] [phone]. Topic: Medicare AWV ?>> Feb 14, 2022  9:54 AM Cher Nakai R wrote: ?Reason for CRM:  ?Left message for patient to call back and schedule Medicare Annual Wellness Visit (AWV) in office.  ? ?If unable to come into the office for AWV,  please offer to do virtually or by telephone. ? ?Last AWV:  02/04/2020 ? ?Please schedule at anytime with [facility] Health Advisor.     ? ?30 minute appointment for Virtual or phone ?45 minute appointment for in office or Initial virtual/phone ? ?Any questions, please call me at [phone] ?

## 2022-03-08 ENCOUNTER — Telehealth: Payer: Self-pay | Admitting: Family Medicine

## 2022-03-08 NOTE — Telephone Encounter (Signed)
Copied from [place] [phone]. Topic: Medicare AWV ?>> Mar 08, 2022 11:02 AM Cher Nakai R wrote: ?Reason for CRM:  ?Left message for patient to call back and schedule Medicare Annual Wellness Visit (AWV) in office.  ? ?If unable to come into the office for AWV,  please offer to do virtually or by telephone. ? ?Last AWV:  02/04/2020 ? ?Please schedule at anytime with [facility] Health Advisor.     ? ?30 minute appointment for Virtual or phone ?45 minute appointment for in office or Initial virtual/phone ? ?Any questions, please call me at [phone] ?

## 2022-03-22 ENCOUNTER — Ambulatory Visit (INDEPENDENT_AMBULATORY_CARE_PROVIDER_SITE_OTHER): Payer: Medicare Other

## 2022-03-22 DIAGNOSIS — Z0000 Encounter for general adult medical examination without abnormal findings: Secondary | ICD-10-CM

## 2022-03-22 NOTE — Progress Notes (Signed)
Subjective:   Karina Leonard is a 81 y.o. female who presents for Medicare Annual (Subsequent) preventive examination.  Virtual Visit via Telephone Note  I connected with  Karina Leonard on 03/22/22 at  3:30 PM EDT by telephone and verified that I am speaking with the correct person using two identifiers.  Location: Patient: home Provider: [facility] Persons participating in the virtual visit: [place]   I discussed the limitations, risks, security and privacy concerns of performing an evaluation and management service by telephone and the availability of in person appointments. The patient expressed understanding and agreed to proceed.  Interactive audio and video telecommunications were attempted between this nurse and patient, however failed, due to patient having technical difficulties OR patient did not have access to video capability.  We continued and completed visit with audio only.  Some vital signs may be absent or patient reported.   Karina Marker, LPN   Review of Systems     Cardiac Risk Factors include: advanced age (>71mn, >>23women);hypertension;dyslipidemia     Objective:    There were no vitals filed for this visit. There is no height or weight on file to calculate BMI.     03/22/2022    3:45 PM 04/09/2020    5:18 PM 04/09/2020    9:48 AM 02/04/2020    3:26 PM 10/20/2018    3:09 PM 01/24/2018    9:27 AM 07/23/2015    9:50 AM  Advanced Directives  Does Patient Have a Medical Advance Directive? No No No No No No No  Would patient like information on creating a medical advance directive? No - Patient declined  No - Patient declined No - Patient declined  Yes (MAU/Ambulatory/Procedural Areas - Information given) No - patient declined information    Current Medications (verified) Outpatient Encounter Medications as of 03/22/2022  Medication Sig   cloNIDine (CATAPRES - DOSED IN MG/24 HR) 0.1 mg/24hr patch PLACE 1 PATCH ONTO THE SKIN ONCE A WEEK.    clopidogrel (PLAVIX) 75 MG tablet Take 1 tablet (75 mg total) by mouth daily.   cyanocobalamin (,VITAMIN B-12,) 1000 MCG/ML injection shah   fexofenadine (ALLEGRA) 180 MG tablet Take 1 tablet (180 mg total) by mouth daily. otc   fluticasone (FLONASE) 50 MCG/ACT nasal spray Place into both nostrils daily. otc   losartan-hydrochlorothiazide (HYZAAR) 100-25 MG tablet Take 1 tablet by mouth daily.   lovastatin (MEVACOR) 20 MG tablet TAKE 2 TABLETS EVERY DAY   metoprolol succinate (TOPROL-XL) 100 MG 24 hr tablet Take 1 tablet (100 mg total) by mouth daily. (Patient taking differently: Take 50 mg by mouth daily. [place])   venlafaxine XR (EFFEXOR-XR) 75 MG 24 hr capsule Take 1 capsule (75 mg total) by mouth daily.   No facility-administered encounter medications on file as of 03/22/2022.    Allergies (verified) Amoxicillin, Donepezil, and Penicillins   History: Past Medical History:  Diagnosis Date   Allergy    Cerebrovascular disease    Depression    Diabetes mellitus without complication (HCC)    (borderline)   Hyperlipidemia    Hypertension    Sciatica    Stroke ([facility]    Urinary incontinence    Wears dentures    full upper and lower   Past Surgical History:  Procedure Laterality Date   GALLBLADDER SURGERY  10/24/2007   polyp removed     rectum- benign   Family History  Problem Relation Age of Onset   Cancer Mother  uterine or ovarian. Unable to remember   Cancer Father        lung   Heart attack Sister    Social History   Socioeconomic History   Marital status: Widowed    Spouse name: Not on file   Number of children: 3   Years of education: some college   Highest education level: 12th grade  Occupational History   Occupation: Retired  Tobacco Use   Smoking status: Former    Packs/day: 1.00    Years: 20.00    Pack years: 20.00    Types: Cigarettes    Quit date: 1967    Years since quitting: 56.4   Smokeless tobacco: Never  Vaping Use   Vaping Use:  Never used  Substance and Sexual Activity   Alcohol use: Yes    Alcohol/week: 0.0 standard drinks    Comment: special occasions   Drug use: No   Sexual activity: Never  Other Topics Concern   Not on file  Social History Narrative   Not on file   Social Determinants of Health   Financial Resource Strain: Low Risk    Difficulty of Paying Living Expenses: Not hard at all  Food Insecurity: No Food Insecurity   Worried About Charity fundraiser in the Last Year: Never true   [place] in the Last Year: Never true  Transportation Needs: No Transportation Needs   Lack of Transportation (Medical): No   Lack of Transportation (Non-Medical): No  Physical Activity: Insufficiently Active   Days of Exercise per Week: 3 days   Minutes of Exercise per Session: 30 min  Stress: No Stress Concern Present   Feeling of Stress : Not at all  Social Connections: Moderately Isolated   Frequency of Communication with Friends and Family: More than three times a week   Frequency of Social Gatherings with Friends and Family: Twice a week   Attends Religious Services: More than 4 times per year   Active Member of [employer] or Organizations: No   Attends Archivist Meetings: Never   Marital Status: Widowed    Tobacco Counseling Counseling given: Not Answered   Clinical Intake:  Pre-visit preparation completed: Yes  Pain : No/denies pain     Nutritional Risks: None Diabetes: No  How often do you need to have someone help you when you read instructions, pamphlets, or other written materials from your doctor or pharmacy?: 1 - Never    Interpreter Needed?: No  Information entered by :: Karina Marker LPN   Activities of Daily Living    03/22/2022    3:46 PM 08/01/2021   10:16 AM  In your present state of health, do you have any difficulty performing the following activities:  Hearing? 0 0  Vision? 0 0  Difficulty concentrating or making decisions? 0 0  Walking or climbing  stairs? 0 0  Dressing or bathing? 0 0  Doing errands, shopping? 0 0  Preparing Food and eating ? N   Using the Toilet? N   In the past six months, have you accidently leaked urine? Y   Comment wears depends for protection   Do you have problems with loss of bowel control? N   Managing your Medications? N   Managing your Finances? N   Housekeeping or managing your Housekeeping? N     Patient Care Team: Juline Patch, MD as PCP - General (Family Medicine)  Indicate any recent Medical Services you may have received from  other than [facility] providers in the past year (date may be approximate).     Assessment:   This is a routine wellness examination for Karina Leonard.  Hearing/Vision screen Vision Screening - Comments:: Annual [facility] screenings with [facility]  Dietary issues and exercise activities discussed: Current Exercise Habits: Home exercise routine, Type of exercise: walking, Time (Minutes): 30, Frequency (Times/Week): 3, Weekly Exercise (Minutes/Week): 90, Intensity: Mild, Exercise limited by: None identified   Goals Addressed   None    Depression Screen    03/22/2022    3:43 PM 01/31/2022   10:09 AM 08/01/2021   10:17 AM 01/19/2021   10:59 AM 04/12/2020    3:01 PM 03/18/2020   10:14 AM 03/08/2020   10:06 AM  PHQ 2/9 Scores  PHQ - 2 Score 1 0 1 1 0 0 0  PHQ- 9 Score 1 0 '2 2 1 '$ 0 0    Fall Risk    03/22/2022    3:47 PM 08/01/2021   10:16 AM 04/12/2020    3:01 PM 03/18/2020   10:14 AM 03/08/2020   10:06 AM  Fall Risk   Falls in the past year? 0 0 0 0 0  Number falls in past yr: 0 0   0  Injury with Fall? 0 0   0  Risk for fall due to : No Fall Risks No Fall Risks No Fall Risks  No Fall Risks  Follow up Falls prevention discussed Falls evaluation completed Falls evaluation completed Falls evaluation completed Falls evaluation completed    [place]:  Any stairs in or around the home? No  If so, are there any without handrails? No   Home free of loose throw rugs in walkways, pet beds, electrical cords, etc? Yes  Adequate lighting in your home to reduce risk of falls? Yes   ASSISTIVE DEVICES UTILIZED TO PREVENT FALLS:  Life alert? No  Use of a cane, walker or w/c? No  Grab bars in the bathroom? Yes  Shower chair or bench in shower? Yes  Elevated toilet seat or a handicapped toilet? Yes   TIMED UP AND GO:  Was the test performed? No .    Cognitive Function: Normal cognitive status assessed by direct observation by this Nurse Health Advisor. No abnormalities found.          01/24/2018    9:34 AM  6CIT Screen  What Year? 0 points  What month? 0 points  What time? 0 points  Count back from 20 0 points  Months in reverse 2 points  Repeat phrase 2 points  Total Score 4 points    Immunizations Immunization History  Administered Date(s) Administered   Fluad Quad(high Dose 65+) 07/23/2019, 07/22/2020, 08/01/2021   Influenza Split 08/25/2010   Influenza, High Dose Seasonal PF 07/18/2017, 07/22/2018   Influenza,inj,Quad PF,6+ Mos 07/04/2013, 07/21/2014, 07/23/2015, 07/20/2016   Influenza-Unspecified 07/21/2014   PFIZER(Purple Top)SARS-COV-2 Vaccination 11/13/2019, 12/04/2019, 07/27/2020, 02/10/2021, 07/12/2021   PNEUMOCOCCAL CONJUGATE-20 01/19/2021   Pneumococcal Conjugate-13 12/18/2014   Pneumococcal Polysaccharide-23 01/20/2013   Tdap 07/23/2015   Zoster, Live 10/23/2013    TDAP status: Up to date  Flu Vaccine status: Up to date  Pneumococcal vaccine status: Completed during today's visit.  Covid-19 vaccine status: Completed vaccines  Qualifies for Shingles Vaccine? Yes   Zostavax completed Yes   Shingrix Completed?: No.    Education has been provided regarding the importance of this vaccine. Patient has been advised to call insurance company to determine  out of pocket expense if they have not yet received this vaccine. Advised may also receive vaccine at local pharmacy or Health Dept. Verbalized  acceptance and understanding.  Screening Tests Health Maintenance  Topic Date Due   Zoster Vaccines- Shingrix (1 of 2) 05/02/2022 (Originally 02/03/1993)   Hepatitis C Screening  08/01/2022 (Originally 02/03/1959)   INFLUENZA VACCINE  05/23/2022   OPHTHALMOLOGY EXAM  05/23/2022   FOOT EXAM  08/01/2022   HEMOGLOBIN A1C  08/02/2022   TETANUS/TDAP  07/22/2025   Pneumonia Vaccine 74+ Years old  Completed   DEXA SCAN  Completed   COVID-19 Vaccine  Completed   HPV VACCINES  Aged Out   MAMMOGRAM  Discontinued    Health Maintenance  There are no preventive care reminders to display for this patient.  Colorectal cancer status: pt was scheduled for colonoscopy 03/2020 but unable to complete due to elevated BP; pt also states she is unable to tolerate prep. Pt to follow up with Dr. Allen Norris.   Mammogram status: No longer required due to age.  Bone density status: no longer required due to age  Lung Cancer Screening: (Low Dose CT Chest recommended if Age 56-80 years, 30 pack-year currently smoking OR have quit w/in 15years.) does not qualify.   Additional Screening:  Hepatitis C Screening: does qualify; due  Vision Screening: Recommended annual ophthalmology exams for early detection of glaucoma and other disorders of the eye. Is the patient up to date with their annual eye exam?  Yes  Who is the provider or what is the name of the office in which the patient attends annual eye exams? [facility].   Dental Screening: Recommended annual dental exams for proper oral hygiene  Community Resource Referral / Chronic Care Management: CRR required this visit?  No   CCM required this visit?  No      Plan:     I have personally reviewed and noted the following in the patient's chart:   Medical and social history Use of alcohol, tobacco or illicit drugs  Current medications and supplements including opioid prescriptions.  Functional ability and status Nutritional status Physical  activity Advanced directives List of other physicians Hospitalizations, surgeries, and ER visits in previous 12 months Vitals Screenings to include cognitive, depression, and falls Referrals and appointments  In addition, I have reviewed and discussed with patient certain preventive protocols, quality metrics, and best practice recommendations. A written personalized care plan for preventive services as well as general preventive health recommendations were provided to patient.     Karina Marker, LPN   03/08/16   Nurse Notes: none

## 2022-03-22 NOTE — Patient Instructions (Signed)
Karina Leonard , Thank you for taking time to come for your Medicare Wellness Visit. I appreciate your ongoing commitment to your health goals. Please review the following plan we discussed and let me know if I can assist you in the future.   Screening recommendations/referrals: Colonoscopy: please discuss follow up with Dr. Allen Norris Mammogram: no longer required Bone Density: no longer required Recommended yearly ophthalmology/optometry visit for glaucoma screening and checkup Recommended yearly dental visit for hygiene and checkup  Vaccinations: Influenza vaccine: done 08/01/21 Pneumococcal vaccine: done 01/19/21 Tdap vaccine: done 07/23/15 Shingles vaccine: Shingrix discussed. Please contact your pharmacy for coverage information.  Covid-19:done 11/13/19, 12/04/19, 07/27/20, 02/10/21 & 07/12/21  Advanced directives: Please bring a copy of your health care power of attorney and living will to the office at your convenience once you have completed that paperwork.   Conditions/risks identified: Recommend drinking 6-8 glasses of water per day   Next appointment: Follow up in one year for your annual wellness visit    Preventive Care 65 Years and Older, Female Preventive care refers to lifestyle choices and visits with your health care provider that can promote health and wellness. What does preventive care include? A yearly physical exam. This is also called an annual well check. Dental exams once or twice a year. Routine eye exams. Ask your health care provider how often you should have your eyes checked. Personal lifestyle choices, including: Daily care of your teeth and gums. Regular physical activity. Eating a healthy diet. Avoiding tobacco and drug use. Limiting alcohol use. Practicing safe sex. Taking low-dose aspirin every day. Taking vitamin and mineral supplements as recommended by your health care provider. What happens during an annual well check? The services and screenings done by  your health care provider during your annual well check will depend on your age, overall health, lifestyle risk factors, and family history of disease. Counseling  Your health care provider may ask you questions about your: Alcohol use. Tobacco use. Drug use. Emotional well-being. Home and relationship well-being. Sexual activity. Eating habits. History of falls. Memory and ability to understand (cognition). Work and work Statistician. Reproductive health. Screening  You may have the following tests or measurements: Height, weight, and BMI. Blood pressure. Lipid and cholesterol levels. These may be checked every 5 years, or more frequently if you are over 4 years old. Skin check. Lung cancer screening. You may have this screening every year starting at age 14 if you have a 30-pack-year history of smoking and currently smoke or have quit within the past 15 years. Fecal occult blood test (FOBT) of the stool. You may have this test every year starting at age 82. Flexible sigmoidoscopy or colonoscopy. You may have a sigmoidoscopy every 5 years or a colonoscopy every 10 years starting at age 42. Hepatitis C blood test. Hepatitis B blood test. Sexually transmitted disease (STD) testing. Diabetes screening. This is done by checking your blood sugar (glucose) after you have not eaten for a while (fasting). You may have this done every 1-3 years. Bone density scan. This is done to screen for osteoporosis. You may have this done starting at age 57. Mammogram. This may be done every 1-2 years. Talk to your health care provider about how often you should have regular mammograms. Talk with your health care provider about your test results, treatment options, and if necessary, the need for more tests. Vaccines  Your health care provider may recommend certain vaccines, such as: Influenza vaccine. This is recommended every year. Tetanus,  diphtheria, and acellular pertussis (Tdap, Td) vaccine. You  may need a Td booster every 10 years. Zoster vaccine. You may need this after age 70. Pneumococcal 13-valent conjugate (PCV13) vaccine. One dose is recommended after age 52. Pneumococcal polysaccharide (PPSV23) vaccine. One dose is recommended after age 50. Talk to your health care provider about which screenings and vaccines you need and how often you need them. This information is not intended to replace advice given to you by your health care provider. Make sure you discuss any questions you have with your health care provider. Document Released: 11/05/2015 Document Revised: 06/28/2016 Document Reviewed: 08/10/2015 Elsevier Interactive Patient Education  2017 [employer] Prevention in the Home Falls can cause injuries. They can happen to people of all ages. There are many things you can do to make your home safe and to help prevent falls. What can I do on the outside of my home? Regularly fix the edges of walkways and driveways and fix any cracks. Remove anything that might make you trip as you walk through a door, such as a raised step or threshold. Trim any bushes or trees on the path to your home. Use bright outdoor lighting. Clear any walking paths of anything that might make someone trip, such as rocks or tools. Regularly check to see if handrails are loose or broken. Make sure that both sides of any steps have handrails. Any raised decks and porches should have guardrails on the edges. Have any leaves, snow, or ice cleared regularly. Use sand or salt on walking paths during winter. Clean up any spills in your garage right away. This includes oil or grease spills. What can I do in the bathroom? Use night lights. Install grab bars by the toilet and in the tub and shower. Do not use towel bars as grab bars. Use non-skid mats or decals in the tub or shower. If you need to sit down in the shower, use a plastic, non-slip stool. Keep the floor dry. Clean up any water that spills  on the floor as soon as it happens. Remove soap buildup in the tub or shower regularly. Attach bath mats securely with double-sided non-slip rug tape. Do not have throw rugs and other things on the floor that can make you trip. What can I do in the bedroom? Use night lights. Make sure that you have a light by your bed that is easy to reach. Do not use any sheets or blankets that are too big for your bed. They should not hang down onto the floor. Have a firm chair that has side arms. You can use this for support while you get dressed. Do not have throw rugs and other things on the floor that can make you trip. What can I do in the kitchen? Clean up any spills right away. Avoid walking on wet floors. Keep items that you use a lot in easy-to-reach places. If you need to reach something above you, use a strong step stool that has a grab bar. Keep electrical cords out of the way. Do not use floor polish or wax that makes floors slippery. If you must use wax, use non-skid floor wax. Do not have throw rugs and other things on the floor that can make you trip. What can I do with my stairs? Do not leave any items on the stairs. Make sure that there are handrails on both sides of the stairs and use them. Fix handrails that are broken or loose. Make  sure that handrails are as long as the stairways. Check any carpeting to make sure that it is firmly attached to the stairs. Fix any carpet that is loose or worn. Avoid having throw rugs at the top or bottom of the stairs. If you do have throw rugs, attach them to the floor with carpet tape. Make sure that you have a light switch at the top of the stairs and the bottom of the stairs. If you do not have them, ask someone to add them for you. What else can I do to help prevent falls? Wear shoes that: Do not have high heels. Have rubber bottoms. Are comfortable and fit you well. Are closed at the toe. Do not wear sandals. If you use a stepladder: Make  sure that it is fully opened. Do not climb a closed stepladder. Make sure that both sides of the stepladder are locked into place. Ask someone to hold it for you, if possible. Clearly mark and make sure that you can see: Any grab bars or handrails. First and last steps. Where the edge of each step is. Use tools that help you move around (mobility aids) if they are needed. These include: Canes. Walkers. Scooters. Crutches. Turn on the lights when you go into a dark area. Replace any light bulbs as soon as they burn out. Set up your furniture so you have a clear path. Avoid moving your furniture around. If any of your floors are uneven, fix them. If there are any pets around you, be aware of where they are. Review your medicines with your doctor. Some medicines can make you feel dizzy. This can increase your chance of falling. Ask your doctor what other things that you can do to help prevent falls. This information is not intended to replace advice given to you by your health care provider. Make sure you discuss any questions you have with your health care provider. Document Released: 08/05/2009 Document Revised: 03/16/2016 Document Reviewed: 11/13/2014 Elsevier Interactive Patient Education  2017 [employer].

## 2022-04-10 ENCOUNTER — Ambulatory Visit: Payer: Self-pay

## 2022-04-10 NOTE — Telephone Encounter (Signed)
  Chief Complaint: dose change Symptoms: NA Frequency: today Pertinent Negatives: NA Disposition: '[]'$ ED /'[]'$ Urgent Care (no appt availability in office) / '[]'$ Appointment(In office/virtual)/ '[]'$  [place] [facility]/ '[]'$ Home Care/ '[]'$ Refused Recommended Disposition /'[]'$ [place] [facility]/ '[x]'$  Follow-up with PCP Additional Notes: pt called, she is needing dosage changed on metoprolol to '50mg'$ . She states that when she switched from [facility] to [facility], [place] sent in rxs to new mail in pharmacy and this one was left off by accident. The dosage that pharmacy has is '100mg'$  and pt states dr. Clayborn Bigness decreased it to '50mg'$  a couple months ago. I asked her if she normally has to refill with Dr Clayborn Bigness and said no that Dr. Ronnald Ramp and Baxter Flattery knows about this. Asked to send HP so she doesn't run out and her BP doesn't go up.   Summary: Rx refill concern   Pt insist that Dr. Ronnald Ramp nurse return her call regarding Rx for metoprolol succinate (TOPROL-XL) 50 MG 24 hr tablet but it is not listed on her chart. Pt stated the Rx for metoprolol succinate (TOPROL-XL) 100 MG 24 hr tablet was lowered by another doctor but Dr. Ronnald Ramp usually calls it in to her pharmacy. Pt stated this time it was not included in the most recent refill request. Pt requests call back asap. Cb# [phone]      Reason for Disposition  [1] Caller has URGENT medicine question about med that PCP or specialist prescribed AND [2] triager unable to answer question  Answer Assessment - Initial Assessment Questions 1. NAME of MEDICATION: "What medicine are you calling about?"     metoprolol 2. QUESTION: "What is your question?" (e.g., double dose of medicine, side effect)     Needing dosage changed back to '50mg'$  3. PRESCRIBING HCP: "Who prescribed it?" Reason: if prescribed by specialist, call should be referred to that group.     Dr. Clayborn Bigness but Ronnald Ramp refills  Protocols used: Medication Question Call-A-AH

## 2022-04-11 ENCOUNTER — Other Ambulatory Visit: Payer: Self-pay

## 2022-04-11 DIAGNOSIS — I10 Essential (primary) hypertension: Secondary | ICD-10-CM

## 2022-04-11 MED ORDER — METOPROLOL SUCCINATE ER 50 MG PO TB24: 50.0000 mg | ORAL_TABLET | Freq: Every day | ORAL | 0 refills | Status: DC

## 2022-04-17 ENCOUNTER — Telehealth: Payer: Self-pay | Admitting: Family Medicine

## 2022-04-17 DIAGNOSIS — I10 Essential (primary) hypertension: Secondary | ICD-10-CM

## 2022-04-18 ENCOUNTER — Other Ambulatory Visit: Payer: Self-pay

## 2022-04-18 ENCOUNTER — Other Ambulatory Visit: Payer: Self-pay | Admitting: *Deleted

## 2022-04-18 DIAGNOSIS — I10 Essential (primary) hypertension: Secondary | ICD-10-CM

## 2022-04-18 MED ORDER — METOPROLOL SUCCINATE ER 50 MG PO TB24: 50.0000 mg | ORAL_TABLET | Freq: Every day | ORAL | 0 refills | Status: DC

## 2022-04-18 NOTE — Telephone Encounter (Signed)
Prior rx for Toprol XL was not received by pharm due to it being classified as paper only. Pt wanted a different pharmacy anyway. Changed pharm and re-sent rx refill.

## 2022-05-09 ENCOUNTER — Other Ambulatory Visit: Payer: Self-pay | Admitting: Family Medicine

## 2022-05-09 DIAGNOSIS — I10 Essential (primary) hypertension: Secondary | ICD-10-CM

## 2022-05-09 NOTE — Telephone Encounter (Signed)
Medication Refill - Medication: Refils on her B12 and the Clonidine patches  Has the patient contacted their pharmacy? No.  She says she never call s the pharmacy (Agent: If no, request that the patient contact the pharmacy for the refill. If patient does not wish to contact the pharmacy document the reason why and proceed with request.) (Agent: If yes, when and what did the pharmacy advise?)  Preferred Pharmacy (with phone number or street name): She hung up before I could get which pharmacy Has the patient been seen for an appointment in the last year OR does the patient have an upcoming appointment? Yes.    Agent: Please be advised that RX refills may take up to 3 business days. We ask that you follow-up with your pharmacy.

## 2022-05-10 NOTE — Telephone Encounter (Signed)
Requested medication (s) are due for refill today: -  Requested medication (s) are on the active medication list: historical med  Last refill:  03/04/20  Future visit scheduled: yes  Notes to clinic:  historical provider/ no B12 within date   Requested Prescriptions  Pending Prescriptions Disp Refills   cyanocobalamin (,VITAMIN B-12,) 1000 MCG/ML injection 1 mL     Sig: shah     Endocrinology:  Vitamins - Vitamin B12 Failed - 05/09/2022 12:27 PM      Failed - B12 Level in normal range and within 360 days    No results found for: "VITAMINB12"       Passed - HCT in normal range and within 360 days    Hematocrit  Date Value Ref Range Status  01/31/2022 37.8 34.0 - 46.6 % Final         Passed - HGB in normal range and within 360 days    Hemoglobin  Date Value Ref Range Status  01/31/2022 12.5 11.1 - 15.9 g/dL Final         Passed - Valid encounter within last 12 months    Recent Outpatient Visits           3 months ago Essential hypertension   [facility] Juline Patch, MD   9 months ago Essential hypertension   [facility] Juline Patch, MD   1 year ago Essential (primary) hypertension   [facility] Juline Patch, MD   1 year ago Need for immunization against influenza   [facility] Juline Patch, MD   2 years ago Essential hypertension   [facility] Juline Patch, MD       Future Appointments             In 2 months Juline Patch, MD [facility], [facility]            Refused Prescriptions Disp Refills   cloNIDine (CATAPRES - DOSED IN MG/24 HR) 0.1 mg/24hr patch 12 patch     Sig: PLACE 1 PATCH ONTO THE SKIN ONCE A WEEK.     Cardiovascular:  Alpha-2 Agonists Passed - 05/09/2022 12:27 PM      Passed - Last BP in normal range    BP Readings from Last 1 Encounters:  01/31/22 128/80         Passed - Last Heart Rate in normal range    Pulse Readings from Last 1 Encounters:  01/31/22 68          Passed - Valid encounter within last 6 months    Recent Outpatient Visits           3 months ago Essential hypertension   [place], MD   9 months ago Essential hypertension   [place], Deanna C, MD   1 year ago Essential (primary) hypertension   [facility] Juline Patch, MD   1 year ago Need for immunization against influenza   [place], Deanna C, MD   2 years ago Essential hypertension   [place], Deanna C, MD       Future Appointments             In 2 months Juline Patch, MD [facility], [facility]

## 2022-05-10 NOTE — Telephone Encounter (Signed)
Requested Prescriptions  Pending Prescriptions Disp Refills  . cyanocobalamin (,VITAMIN B-12,) 1000 MCG/ML injection 1 mL     Sig: shah     Endocrinology:  Vitamins - Vitamin B12 Failed - 05/09/2022 12:27 PM      Failed - B12 Level in normal range and within 360 days    No results found for: "VITAMINB12"       Passed - HCT in normal range and within 360 days    Hematocrit  Date Value Ref Range Status  01/31/2022 37.8 34.0 - 46.6 % Final         Passed - HGB in normal range and within 360 days    Hemoglobin  Date Value Ref Range Status  01/31/2022 12.5 11.1 - 15.9 g/dL Final         Passed - Valid encounter within last 12 months    Recent Outpatient Visits          3 months ago Essential hypertension   [place], MD   9 months ago Essential hypertension   [facility] Juline Patch, MD   1 year ago Essential (primary) hypertension   [facility] Juline Patch, MD   1 year ago Need for immunization against influenza   [place], Deanna C, MD   2 years ago Essential hypertension   [place], Deanna C, MD      Future Appointments            In 2 months Juline Patch, MD [facility], [facility]           Refused Prescriptions Disp Refills  . cloNIDine (CATAPRES - DOSED IN MG/24 HR) 0.1 mg/24hr patch 12 patch     Sig: PLACE 1 PATCH ONTO THE SKIN ONCE A WEEK.     Cardiovascular:  Alpha-2 Agonists Passed - 05/09/2022 12:27 PM      Passed - Last BP in normal range    BP Readings from Last 1 Encounters:  01/31/22 128/80         Passed - Last Heart Rate in normal range    Pulse Readings from Last 1 Encounters:  01/31/22 68         Passed - Valid encounter within last 6 months    Recent Outpatient Visits          3 months ago Essential hypertension   [place], MD   9 months ago Essential hypertension   [place], Deanna C, MD    1 year ago Essential (primary) hypertension   [facility] Juline Patch, MD   1 year ago Need for immunization against influenza   [place], Deanna C, MD   2 years ago Essential hypertension   [place], Deanna C, MD      Future Appointments            In 2 months Juline Patch, MD [facility], [facility]

## 2022-05-15 ENCOUNTER — Telehealth: Payer: Self-pay | Admitting: Family Medicine

## 2022-05-15 ENCOUNTER — Telehealth: Payer: Self-pay

## 2022-05-15 NOTE — Telephone Encounter (Signed)
Called Cardio and asked that Dr Clayborn Bigness send in clonidine for pt as well as spoke to [place] and advised her that pt needs to follow up with neurology, per a note in pt's chart, concerning her B12.

## 2022-05-15 NOTE — Telephone Encounter (Signed)
Copied from [place] [id number]. Topic: General - Other >> May 15, 2022  1:01 PM Karina Leonard wrote: Reason for CRM: The patient has called to follow up on their previous request for cloNIDine (CATAPRES - DOSED IN MG/24 HR) 0.1 mg/24hr patch [[id number]]  prescription refill   The patient would like to speak with a member of staff about refills of the medication   Please contact the patient further when possible

## 2022-05-22 ENCOUNTER — Telehealth: Payer: Self-pay | Admitting: Family Medicine

## 2022-05-22 NOTE — Telephone Encounter (Signed)
Copied from [place]. Topic: General - Inquiry >> May 22, 2022 12:00 PM Karina Leonard wrote: Reason for CRM: Pt called saying she is on the last patch of her Clonidine.  She said she has not contacted her pharmacy  she said she goes through [place] for this.  CB#  [phone]

## 2022-05-25 ENCOUNTER — Telehealth: Payer: Self-pay | Admitting: Family Medicine

## 2022-05-25 ENCOUNTER — Other Ambulatory Visit: Payer: Self-pay | Admitting: Family Medicine

## 2022-05-25 ENCOUNTER — Other Ambulatory Visit: Payer: Self-pay

## 2022-05-25 DIAGNOSIS — I10 Essential (primary) hypertension: Secondary | ICD-10-CM

## 2022-05-25 DIAGNOSIS — H25813 Combined forms of age-related cataract, bilateral: Secondary | ICD-10-CM | POA: Diagnosis not present

## 2022-05-25 MED ORDER — METOPROLOL SUCCINATE ER 50 MG PO TB24: 50.0000 mg | ORAL_TABLET | Freq: Every day | ORAL | 0 refills | Status: DC

## 2022-05-25 NOTE — Telephone Encounter (Signed)
reordered today 05/25/22  Requested Prescriptions  Refused Prescriptions Disp Refills  . metoprolol succinate (TOPROL-XL) 50 MG 24 hr tablet 90 tablet 0    Sig: Take 1 tablet (50 mg total) by mouth daily. [place]     Cardiovascular:  Beta Blockers Passed - 05/25/2022 11:58 AM      Passed - Last BP in normal range    BP Readings from Last 1 Encounters:  01/31/22 128/80         Passed - Last Heart Rate in normal range    Pulse Readings from Last 1 Encounters:  01/31/22 68         Passed - Valid encounter within last 6 months    Recent Outpatient Visits          3 months ago Essential hypertension   [place], MD   9 months ago Essential hypertension   [place], Deanna C, MD   1 year ago Essential (primary) hypertension   [facility] Juline Patch, MD   1 year ago Need for immunization against influenza   [place], Deanna C, MD   2 years ago Essential hypertension   [place], Deanna C, MD      Future Appointments            In 2 months Juline Patch, MD [facility], [facility]

## 2022-05-25 NOTE — Telephone Encounter (Signed)
Medication Refill - Medication: metoprolol succinate (TOPROL-XL) 50 MG 24 hr tablet (patient is completely out of medication) Patient would like this request expedited  Has the patient contacted their pharmacy? No. Patient states she calls PCP     Preferred Pharmacy (with phone number or street name):  [id number] - [place], [address] AT [place] Phone:  [phone]  Fax:  [phone]      Has the patient been seen for an appointment in the last year OR does the patient have an upcoming appointment? Yes.    Agent: Please be advised that RX refills may take up to 3 business days. We ask that you follow-up with your pharmacy.

## 2022-05-25 NOTE — Telephone Encounter (Signed)
Copied from [place]. Topic: General - Other >> May 25, 2022  8:59 AM Tiffany B wrote: Reason for CRM: Patient requesting to speak to Baxter Flattery regarding her metoprolol succinate (TOPROL-XL) 50 MG 24 hr tablet refill request. TE was sent to [facility] today and informed patient in the future please allow 48 hours for PCP to refill. Patient states she has appointment in [place] and would like to pick up medication today. Patient requesting to speak with Baxter Flattery

## 2022-06-01 DIAGNOSIS — H3581 Retinal edema: Secondary | ICD-10-CM | POA: Diagnosis not present

## 2022-06-01 DIAGNOSIS — R0602 Shortness of breath: Secondary | ICD-10-CM | POA: Diagnosis not present

## 2022-06-01 DIAGNOSIS — Z8673 Personal history of transient ischemic attack (TIA), and cerebral infarction without residual deficits: Secondary | ICD-10-CM | POA: Diagnosis not present

## 2022-06-01 DIAGNOSIS — I2510 Atherosclerotic heart disease of native coronary artery without angina pectoris: Secondary | ICD-10-CM | POA: Diagnosis not present

## 2022-06-01 DIAGNOSIS — H35033 Hypertensive retinopathy, bilateral: Secondary | ICD-10-CM | POA: Diagnosis not present

## 2022-06-01 DIAGNOSIS — R001 Bradycardia, unspecified: Secondary | ICD-10-CM | POA: Diagnosis not present

## 2022-06-01 LAB — HM DIABETES EYE EXAM

## 2022-06-20 DIAGNOSIS — R413 Other amnesia: Secondary | ICD-10-CM | POA: Diagnosis not present

## 2022-06-20 DIAGNOSIS — E559 Vitamin D deficiency, unspecified: Secondary | ICD-10-CM | POA: Diagnosis not present

## 2022-06-20 DIAGNOSIS — I10 Essential (primary) hypertension: Secondary | ICD-10-CM | POA: Diagnosis not present

## 2022-06-20 DIAGNOSIS — R2689 Other abnormalities of gait and mobility: Secondary | ICD-10-CM | POA: Diagnosis not present

## 2022-06-20 DIAGNOSIS — M25551 Pain in right hip: Secondary | ICD-10-CM | POA: Diagnosis not present

## 2022-07-17 DIAGNOSIS — M545 Low back pain, unspecified: Secondary | ICD-10-CM | POA: Diagnosis not present

## 2022-07-17 DIAGNOSIS — R262 Difficulty in walking, not elsewhere classified: Secondary | ICD-10-CM | POA: Diagnosis not present

## 2022-07-17 DIAGNOSIS — M6281 Muscle weakness (generalized): Secondary | ICD-10-CM | POA: Diagnosis not present

## 2022-07-19 ENCOUNTER — Telehealth: Payer: Self-pay

## 2022-07-19 ENCOUNTER — Telehealth: Payer: Self-pay | Admitting: Family Medicine

## 2022-07-19 NOTE — Telephone Encounter (Signed)
I returned pt's call concerning order for back PT to [facility]. Dr Manuella Ghazi originally ordered this for balance issues. I sent him a message to ask that he send additional order for back, since the PT is suggesting the balance may be coming from pt's back. I also advised pt to call [facility] office and tell them what she is needing as well. Pt voiced understanding

## 2022-07-19 NOTE — Telephone Encounter (Signed)
Copied from [place] [phone]. Topic: General - Other >> Jul 19, 2022 10:37 AM Sabas Sous wrote: Reason for CRM: Pt called requesting to speak to Baxter Flattery, says this is regarding a request for a document needed in writing from Dr. Ronnald Ramp. Please advise  Best contact: [phone]  "Please ask PCP to write a letter of referral to [facility] for back pain and exercises.  She saw Neurologist PA Eulas Post at Dr. Raul Del office and they recommended her initially for Balance. [facility] need a letter from PCP to see her for back pain/exercises.  [place] Phone: [phone] No fax listed"  Next appt is tomorrow.

## 2022-07-19 NOTE — Telephone Encounter (Signed)
Copied from [place] [phone]. Topic: General - Other >> Jul 19, 2022 10:37 AM Sabas Sous wrote: Reason for CRM: Pt called requesting to speak to Baxter Flattery, says this is regarding a request for a document needed in writing from Dr. Ronnald Ramp. Please advise  Best contact: [phone]  "Please ask PCP to write a letter of referral to [facility] for back pain and exercises.  She saw Neurologist PA Eulas Post at Dr. Raul Del office and they recommended her initially for Balance. [facility] need a letter from PCP to see her for back pain/exercises.  [place] Phone: [phone] No fax listed"  Next appt is tomorrow.

## 2022-07-20 DIAGNOSIS — M545 Low back pain, unspecified: Secondary | ICD-10-CM | POA: Diagnosis not present

## 2022-07-20 DIAGNOSIS — R262 Difficulty in walking, not elsewhere classified: Secondary | ICD-10-CM | POA: Diagnosis not present

## 2022-07-20 DIAGNOSIS — M6281 Muscle weakness (generalized): Secondary | ICD-10-CM | POA: Diagnosis not present

## 2022-07-26 DIAGNOSIS — R262 Difficulty in walking, not elsewhere classified: Secondary | ICD-10-CM | POA: Diagnosis not present

## 2022-07-26 DIAGNOSIS — M545 Low back pain, unspecified: Secondary | ICD-10-CM | POA: Diagnosis not present

## 2022-07-26 DIAGNOSIS — M6281 Muscle weakness (generalized): Secondary | ICD-10-CM | POA: Diagnosis not present

## 2022-08-01 DIAGNOSIS — M6281 Muscle weakness (generalized): Secondary | ICD-10-CM | POA: Diagnosis not present

## 2022-08-01 DIAGNOSIS — R262 Difficulty in walking, not elsewhere classified: Secondary | ICD-10-CM | POA: Diagnosis not present

## 2022-08-01 DIAGNOSIS — M545 Low back pain, unspecified: Secondary | ICD-10-CM | POA: Diagnosis not present

## 2022-08-02 ENCOUNTER — Encounter: Payer: Self-pay | Admitting: Family Medicine

## 2022-08-02 ENCOUNTER — Ambulatory Visit (INDEPENDENT_AMBULATORY_CARE_PROVIDER_SITE_OTHER): Payer: Medicare Other | Admitting: Family Medicine

## 2022-08-02 VITALS — BP 122/62 | HR 64 | Ht 67.0 in | Wt 193.0 lb

## 2022-08-02 DIAGNOSIS — I6789 Other cerebrovascular disease: Secondary | ICD-10-CM

## 2022-08-02 DIAGNOSIS — I10 Essential (primary) hypertension: Secondary | ICD-10-CM

## 2022-08-02 DIAGNOSIS — Z23 Encounter for immunization: Secondary | ICD-10-CM

## 2022-08-02 DIAGNOSIS — F339 Major depressive disorder, recurrent, unspecified: Secondary | ICD-10-CM

## 2022-08-02 DIAGNOSIS — R7303 Prediabetes: Secondary | ICD-10-CM | POA: Diagnosis not present

## 2022-08-02 DIAGNOSIS — E7849 Other hyperlipidemia: Secondary | ICD-10-CM

## 2022-08-02 MED ORDER — CLOPIDOGREL BISULFATE 75 MG PO TABS: 75.0000 mg | ORAL_TABLET | Freq: Every day | ORAL | 1 refills | Status: DC

## 2022-08-02 MED ORDER — LOSARTAN POTASSIUM-HCTZ 100-25 MG PO TABS: 1.0000 | ORAL_TABLET | Freq: Every day | ORAL | 1 refills | Status: DC

## 2022-08-02 MED ORDER — LOVASTATIN 20 MG PO TABS: ORAL_TABLET | ORAL | 1 refills | Status: DC

## 2022-08-02 MED ORDER — VENLAFAXINE HCL ER 75 MG PO CP24: 75.0000 mg | ORAL_CAPSULE | Freq: Every day | ORAL | 1 refills | Status: DC

## 2022-08-02 MED ORDER — CLONIDINE 0.1 MG/24HR TD PTWK: MEDICATED_PATCH | TRANSDERMAL | 1 refills | Status: DC

## 2022-08-02 NOTE — Progress Notes (Signed)
Date:  08/02/2022   Name:  Karina Leonard   DOB:  Mar 07, 1941   MRN:  [id number]   Chief Complaint: Hypertension, Hyperlipidemia, Depression, Flu Vaccine, and Prediabetes  Hypertension This is a chronic problem. The current episode started more than 1 year ago. The problem has been gradually improving since onset. The problem is controlled. Pertinent negatives include no anxiety, blurred vision, chest pain, headaches, malaise/fatigue, neck pain, orthopnea, palpitations, peripheral edema, PND, shortness of breath or sweats. There are no associated agents to hypertension. There are no known risk factors for coronary artery disease. The current treatment provides moderate improvement. There are no compliance problems.  Hypertensive end-organ damage includes CVA. There is no history of angina, kidney disease, CAD/MI, heart failure, left ventricular hypertrophy, PVD or retinopathy. There is no history of chronic renal disease, a hypertension causing med or renovascular disease.  Hyperlipidemia This is a chronic problem. The current episode started more than 1 year ago. The problem is controlled. She has no history of chronic renal disease, diabetes, hypothyroidism, liver disease, obesity or nephrotic syndrome. Pertinent negatives include no chest pain, focal sensory loss, focal weakness, leg pain, myalgias or shortness of breath. Current antihyperlipidemic treatment includes statins. The current treatment provides moderate improvement of lipids. There are no compliance problems.  Risk factors for coronary artery disease include dyslipidemia and hypertension.  Depression        This is a chronic problem.  The current episode started more than 1 year ago.   The problem occurs intermittently.  The problem has been gradually improving since onset.  Associated symptoms include no decreased concentration, no fatigue, no helplessness, no hopelessness, does not have insomnia, not irritable, no restlessness, no  decreased interest, no appetite change, no body aches, no myalgias, no headaches, no indigestion, not sad and no suicidal ideas.     The symptoms are aggravated by nothing.  Past treatments include SNRIs - Serotonin and norepinephrine reuptake inhibitors.  Previous treatment provided moderate relief.   Pertinent negatives include no hypothyroidism and no anxiety.   Lab Results  Component Value Date   NA 146 (H) 01/31/2022   K 4.7 01/31/2022   CO2 28 01/31/2022   GLUCOSE 104 (H) 01/31/2022   BUN 20 01/31/2022   CREATININE 1.38 (H) 01/31/2022   CALCIUM 9.2 01/31/2022   EGFR 39 (L) 01/31/2022   GFRNONAA 52 (L) 01/22/2020   Lab Results  Component Value Date   CHOL 143 01/31/2022   HDL 56 01/31/2022   LDLCALC 75 01/31/2022   TRIG 55 01/31/2022   CHOLHDL 3.0 03/18/2019   No results found for: "TSH" Lab Results  Component Value Date   HGBA1C 5.7 (H) 01/31/2022   Lab Results  Component Value Date   WBC 12.2 (H) 01/31/2022   HGB 12.5 01/31/2022   HCT 37.8 01/31/2022   MCV 92 01/31/2022   PLT 214 01/31/2022   Lab Results  Component Value Date   ALT 6 01/31/2022   AST 15 01/31/2022   ALKPHOS 102 01/31/2022   BILITOT 0.4 01/31/2022   No results found for: "25OHVITD2", "25OHVITD3", "VD25OH"   Review of Systems  Constitutional:  Negative for appetite change, chills, fatigue, fever and malaise/fatigue.  HENT:  Negative for drooling, ear discharge, ear pain and sore throat.   Eyes:  Negative for blurred vision.  Respiratory:  Negative for cough, shortness of breath and wheezing.   Cardiovascular:  Negative for chest pain, palpitations, orthopnea, leg swelling and PND.  Gastrointestinal:  Negative  for abdominal pain, blood in stool, constipation, diarrhea and nausea.  Endocrine: Negative for polydipsia.  Genitourinary:  Negative for dysuria, frequency, hematuria and urgency.  Musculoskeletal:  Negative for back pain, myalgias and neck pain.  Skin:  Negative for rash.   Allergic/Immunologic: Negative for environmental allergies.  Neurological:  Negative for dizziness, focal weakness and headaches.  Hematological:  Does not bruise/bleed easily.  Psychiatric/Behavioral:  Positive for depression. Negative for decreased concentration and suicidal ideas. The patient is not nervous/anxious and does not have insomnia.     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   Anxiety attack 02/16/2015   Recurrent major depressive episodes ([place]) 02/16/2015   Essential (primary) hypertension 02/16/2015   H/O: HTN (hypertension) 02/16/2015   Asymptomatic hypertensive urgency 11/14/2014   Tubulovillous adenoma of rectum 10/21/2014   Depression 10/21/2014   Nuclear sclerosis of both eyes 07/07/2014    Allergies  Allergen Reactions   Amoxicillin Hives   Donepezil Diarrhea   Penicillins Hives    Past Surgical History:  Procedure Laterality Date   GALLBLADDER SURGERY  10/24/2007   polyp removed     rectum- benign    Social History   Tobacco Use   Smoking status: Former    Packs/day: 1.00    Years: 20.00    Total pack years: 20.00    Types: Cigarettes    Quit date: 35    Years since quitting: 56.8   Smokeless tobacco: Never  Vaping Use   Vaping Use: Never used  Substance Use Topics   Alcohol use: Yes    Alcohol/week: 0.0 standard drinks of alcohol    Comment: special occasions   Drug use: No     Medication list has been reviewed and updated.  Current Meds   Medication Sig   cloNIDine (CATAPRES - DOSED IN MG/24 HR) 0.1 mg/24hr patch PLACE 1 PATCH ONTO THE SKIN ONCE A WEEK.   clopidogrel (PLAVIX) 75 MG tablet Take 1 tablet (75 mg total) by mouth daily.   cyanocobalamin (,VITAMIN B-12,) 1000 MCG/ML injection shah   fexofenadine (ALLEGRA) 180 MG tablet Take 1 tablet (180 mg total) by mouth daily. otc   fluticasone (FLONASE) 50 MCG/ACT nasal spray Place into both nostrils daily. otc   losartan-hydrochlorothiazide (HYZAAR) 100-25 MG tablet Take 1 tablet by mouth daily.   lovastatin (MEVACOR) 20 MG tablet TAKE 2 TABLETS EVERY DAY   metoprolol succinate (TOPROL-XL) 50 MG 24 hr tablet Take 1 tablet (50 mg total) by mouth daily. [place]   venlafaxine XR (EFFEXOR-XR) 75 MG 24 hr capsule Take 1 capsule (75 mg total) by mouth daily.       08/02/2022   11:03 AM 01/31/2022   10:09 AM 01/19/2021   11:03 AM 04/12/2020    3:02 PM  GAD 7 : Generalized Anxiety Score  Nervous, Anxious, on Edge 0 0 1 0  Control/stop worrying 0 0 2 0  Worry too much - different things 0 0 2 0  Trouble relaxing 0 0 0 0  Restless 0 0 0 0  Easily annoyed or irritable 0  0 0 0  Afraid - awful might happen 0 0 1 0  Total GAD 7 Score 0 0 6 0  Anxiety Difficulty Not difficult at all Not difficult at all Somewhat difficult Not difficult at all       08/02/2022   11:03 AM 03/22/2022    3:43 PM 01/31/2022   10:09 AM  Depression screen PHQ 2/9  Decreased Interest 0 0 0  Down, Depressed, Hopeless 0 1 0  PHQ - 2 Score 0 1 0  Altered sleeping 0 0 0  Tired, decreased energy 0 0 0  Change in appetite 0 0 0  Feeling bad or failure about yourself  0 0 0  Trouble concentrating 0 0 0  Moving slowly or fidgety/restless 0 0 0  Suicidal thoughts 0 0 0  PHQ-9 Score 0 1 0  Difficult doing work/chores Not difficult at all Not difficult at all Not difficult at all    BP Readings from Last 3 Encounters:  08/02/22 122/62  01/31/22 128/80  11/15/21 (!) 142/86    Physical Exam Vitals  and nursing note reviewed.  Constitutional:      General: She is not irritable.    Appearance: She is well-developed.  HENT:     Head: Normocephalic.     Right Ear: Tympanic membrane and external ear normal.     Left Ear: Tympanic membrane and external ear normal.     Nose: No congestion or rhinorrhea.     Mouth/Throat:     Pharynx: No oropharyngeal exudate or posterior oropharyngeal erythema.  Eyes:     General: Lids are everted, no foreign bodies appreciated. No scleral icterus.       Left eye: No foreign body or hordeolum.     Conjunctiva/sclera: Conjunctivae normal.     Right eye: Right conjunctiva is not injected.     Left eye: Left conjunctiva is not injected.     Pupils: Pupils are equal, round, and reactive to light.  Neck:     Thyroid: No thyromegaly.     Vascular: No JVD.     Trachea: No tracheal deviation.  Cardiovascular:     Rate and Rhythm: Normal rate and regular rhythm.     Heart sounds: Normal heart sounds. No murmur heard.    No friction rub. No gallop.  Pulmonary:     Effort: Pulmonary effort is normal. No respiratory distress.     Breath sounds: Normal breath sounds. No wheezing, rhonchi or rales.  Abdominal:     General: Bowel sounds are normal.     Palpations: Abdomen is soft. There is no mass.     Tenderness: There is no abdominal tenderness. There is no guarding or rebound.  Musculoskeletal:        General: No tenderness. Normal range of motion.     Cervical back: Normal range of motion and neck supple.  Lymphadenopathy:     Cervical: No cervical adenopathy.  Skin:    General: Skin is warm.     Findings: No rash.  Neurological:     Mental Status: She is alert and oriented to person, place, and time.     Cranial Nerves: No cranial nerve deficit.     Deep Tendon Reflexes: Reflexes normal.  Psychiatric:        Mood and Affect: Mood is not anxious or depressed.     Wt Readings from Last 3 Encounters:  08/02/22 193 lb (87.5 kg)  01/31/22 190 lb  (86.2 kg)  08/01/21 203  lb (92.1 kg)    BP 122/62   Pulse 64   Ht 5' 7" (1.702 m)   Wt 193 lb (87.5 kg)   BMI 30.23 kg/m   Assessment and Plan:  1. Essential hypertension .  Controlled.  Stable.  Blood pressure today is 122/62.  Continue clonidine 0.1 mg per 24-hour patch 1 patch per week and losartan hydrochlorothiazide 100-25 mg once a day.  We will check renal function panel for electrolytes and GFR. - cloNIDine (CATAPRES - DOSED IN MG/24 HR) 0.1 mg/24hr patch; PLACE 1 PATCH ONTO THE SKIN ONCE A WEEK.  Dispense: 12 patch; Refill: 1 - losartan-hydrochlorothiazide (HYZAAR) 100-25 MG tablet; Take 1 tablet by mouth daily.  Dispense: 90 tablet; Refill: 1 - Renal Function Panel  2. Cerebral microvascular disease .  Controlled.  Stable.  Continue Plavix 75 mg daily.  Chronic.  Controlled.  Stable.  Continue Plavix 75 mg once a day. - clopidogrel (PLAVIX) 75 MG tablet; Take 1 tablet (75 mg total) by mouth daily.  Dispense: 90 tablet; Refill: 1  3. Familial multiple lipoprotein-type hyperlipidemia Chronic.  Controlled.  Stable.  Continue 2 tablets every day. - lovastatin (MEVACOR) 20 MG tablet; TAKE 2 TABLETS EVERY DAY  Dispense: 180 tablet; Refill: 1  4. Recurrent major depressive episodes (HCC) Chronic.  Controlled.  Stable.  PHQ is 0 GAD score is 0 continue venlafaxine Exar 75 mg once a day. - venlafaxine XR (EFFEXOR-XR) 75 MG 24 hr capsule; Take 1 capsule (75 mg total) by mouth daily.  Dispense: 90 capsule; Refill: 1  5. Prediabetes Chronic.  Controlled.  Patient had very minimally elevated A1c just in diabetic range we will recheck A1c and that she is doing better with her diet except that she still likes her bowl of ice cream.  Patient is encouraged to do so in moderation however we will check renal function panel as well. - HgB A1c - Renal Function Panel  6. Need for immunization against influenza Discussed and administered. - Flu Vaccine QUAD High Dose(Fluad)    Otilio Miu, MD

## 2022-08-03 DIAGNOSIS — M6281 Muscle weakness (generalized): Secondary | ICD-10-CM | POA: Diagnosis not present

## 2022-08-03 DIAGNOSIS — R262 Difficulty in walking, not elsewhere classified: Secondary | ICD-10-CM | POA: Diagnosis not present

## 2022-08-03 DIAGNOSIS — M545 Low back pain, unspecified: Secondary | ICD-10-CM | POA: Diagnosis not present

## 2022-08-03 LAB — HEMOGLOBIN A1C
Est. average glucose Bld gHb Est-mCnc: 123 mg/dL
Hgb A1c MFr Bld: 5.9 % — ABNORMAL HIGH (ref 4.8–5.6)

## 2022-08-03 LAB — RENAL FUNCTION PANEL
Albumin: 3.9 g/dL (ref 3.8–4.8)
BUN/Creatinine Ratio: 17 (ref 12–28)
BUN: 24 mg/dL (ref 8–27)
CO2: 26 mmol/L (ref 20–29)
Calcium: 8.8 mg/dL (ref 8.7–10.3)
Chloride: 101 mmol/L (ref 96–106)
Creatinine, Ser: 1.39 mg/dL — ABNORMAL HIGH (ref 0.57–1.00)
Glucose: 105 mg/dL — ABNORMAL HIGH (ref 70–99)
Phosphorus: 3.5 mg/dL (ref 3.0–4.3)
Potassium: 4.4 mmol/L (ref 3.5–5.2)
Sodium: 141 mmol/L (ref 134–144)
eGFR: 39 mL/min/{1.73_m2} — ABNORMAL LOW (ref >59)

## 2022-08-08 DIAGNOSIS — M6281 Muscle weakness (generalized): Secondary | ICD-10-CM | POA: Diagnosis not present

## 2022-08-08 DIAGNOSIS — M545 Low back pain, unspecified: Secondary | ICD-10-CM | POA: Diagnosis not present

## 2022-08-08 DIAGNOSIS — R262 Difficulty in walking, not elsewhere classified: Secondary | ICD-10-CM | POA: Diagnosis not present

## 2022-08-10 DIAGNOSIS — R262 Difficulty in walking, not elsewhere classified: Secondary | ICD-10-CM | POA: Diagnosis not present

## 2022-08-10 DIAGNOSIS — M545 Low back pain, unspecified: Secondary | ICD-10-CM | POA: Diagnosis not present

## 2022-08-10 DIAGNOSIS — M6281 Muscle weakness (generalized): Secondary | ICD-10-CM | POA: Diagnosis not present

## 2022-08-11 ENCOUNTER — Other Ambulatory Visit: Payer: Self-pay | Admitting: Family Medicine

## 2022-08-11 DIAGNOSIS — I10 Essential (primary) hypertension: Secondary | ICD-10-CM

## 2022-08-11 NOTE — Telephone Encounter (Signed)
Change of pharmacy Requested Prescriptions  Pending Prescriptions Disp Refills  . cloNIDine (CATAPRES - DOSED IN MG/24 HR) 0.1 mg/24hr patch [Pharmacy Med Name: CLONIDINE 0.'1MG'$ /24H WEEKLY PATCH] 12 patch 1    Sig: PLACE 1 PATCH ONTO THE SKIN ONCE A WEEK.     Cardiovascular:  Alpha-2 Agonists Passed - 08/11/2022  3:53 AM      Passed - Last BP in normal range    BP Readings from Last 1 Encounters:  08/02/22 122/62         Passed - Last Heart Rate in normal range    Pulse Readings from Last 1 Encounters:  08/02/22 64         Passed - Valid encounter within last 6 months    Recent Outpatient Visits          1 week ago Essential hypertension   [facility] [place], Deanna C, MD   6 months ago Essential hypertension   [facility] [place], Deanna C, MD   1 year ago Essential hypertension   [facility] [place], Deanna C, MD   1 year ago Essential (primary) hypertension   [facility] [place], Deanna C, MD   2 years ago Need for immunization against influenza   [facility] [place], [place], MD      Future Appointments            In 5 months Juline Patch, MD [facility], [facility]

## 2022-08-22 DIAGNOSIS — M6281 Muscle weakness (generalized): Secondary | ICD-10-CM | POA: Diagnosis not present

## 2022-08-22 DIAGNOSIS — R262 Difficulty in walking, not elsewhere classified: Secondary | ICD-10-CM | POA: Diagnosis not present

## 2022-08-22 DIAGNOSIS — M545 Low back pain, unspecified: Secondary | ICD-10-CM | POA: Diagnosis not present

## 2022-08-25 DIAGNOSIS — M545 Low back pain, unspecified: Secondary | ICD-10-CM | POA: Diagnosis not present

## 2022-08-25 DIAGNOSIS — R262 Difficulty in walking, not elsewhere classified: Secondary | ICD-10-CM | POA: Diagnosis not present

## 2022-08-25 DIAGNOSIS — M6281 Muscle weakness (generalized): Secondary | ICD-10-CM | POA: Diagnosis not present

## 2022-08-31 DIAGNOSIS — M6281 Muscle weakness (generalized): Secondary | ICD-10-CM | POA: Diagnosis not present

## 2022-08-31 DIAGNOSIS — M545 Low back pain, unspecified: Secondary | ICD-10-CM | POA: Diagnosis not present

## 2022-08-31 DIAGNOSIS — R262 Difficulty in walking, not elsewhere classified: Secondary | ICD-10-CM | POA: Diagnosis not present

## 2022-09-01 DIAGNOSIS — H3581 Retinal edema: Secondary | ICD-10-CM | POA: Diagnosis not present

## 2022-09-01 DIAGNOSIS — H3563 Retinal hemorrhage, bilateral: Secondary | ICD-10-CM | POA: Diagnosis not present

## 2022-09-01 DIAGNOSIS — H2513 Age-related nuclear cataract, bilateral: Secondary | ICD-10-CM | POA: Diagnosis not present

## 2022-09-01 DIAGNOSIS — H35033 Hypertensive retinopathy, bilateral: Secondary | ICD-10-CM | POA: Diagnosis not present

## 2022-09-05 DIAGNOSIS — R262 Difficulty in walking, not elsewhere classified: Secondary | ICD-10-CM | POA: Diagnosis not present

## 2022-09-05 DIAGNOSIS — M545 Low back pain, unspecified: Secondary | ICD-10-CM | POA: Diagnosis not present

## 2022-09-05 DIAGNOSIS — M6281 Muscle weakness (generalized): Secondary | ICD-10-CM | POA: Diagnosis not present

## 2022-09-07 DIAGNOSIS — M545 Low back pain, unspecified: Secondary | ICD-10-CM | POA: Diagnosis not present

## 2022-09-07 DIAGNOSIS — M6281 Muscle weakness (generalized): Secondary | ICD-10-CM | POA: Diagnosis not present

## 2022-09-07 DIAGNOSIS — R262 Difficulty in walking, not elsewhere classified: Secondary | ICD-10-CM | POA: Diagnosis not present

## 2022-10-04 DIAGNOSIS — R262 Difficulty in walking, not elsewhere classified: Secondary | ICD-10-CM | POA: Diagnosis not present

## 2022-10-04 DIAGNOSIS — M545 Low back pain, unspecified: Secondary | ICD-10-CM | POA: Diagnosis not present

## 2022-10-04 DIAGNOSIS — M6281 Muscle weakness (generalized): Secondary | ICD-10-CM | POA: Diagnosis not present

## 2022-10-06 DIAGNOSIS — M545 Low back pain, unspecified: Secondary | ICD-10-CM | POA: Diagnosis not present

## 2022-10-06 DIAGNOSIS — R262 Difficulty in walking, not elsewhere classified: Secondary | ICD-10-CM | POA: Diagnosis not present

## 2022-10-06 DIAGNOSIS — M6281 Muscle weakness (generalized): Secondary | ICD-10-CM | POA: Diagnosis not present

## 2022-10-10 DIAGNOSIS — M545 Low back pain, unspecified: Secondary | ICD-10-CM | POA: Diagnosis not present

## 2022-10-10 DIAGNOSIS — M6281 Muscle weakness (generalized): Secondary | ICD-10-CM | POA: Diagnosis not present

## 2022-10-10 DIAGNOSIS — R262 Difficulty in walking, not elsewhere classified: Secondary | ICD-10-CM | POA: Diagnosis not present

## 2022-10-24 DIAGNOSIS — M545 Low back pain, unspecified: Secondary | ICD-10-CM | POA: Diagnosis not present

## 2022-10-24 DIAGNOSIS — M6281 Muscle weakness (generalized): Secondary | ICD-10-CM | POA: Diagnosis not present

## 2022-10-24 DIAGNOSIS — R262 Difficulty in walking, not elsewhere classified: Secondary | ICD-10-CM | POA: Diagnosis not present

## 2022-10-26 DIAGNOSIS — M6281 Muscle weakness (generalized): Secondary | ICD-10-CM | POA: Diagnosis not present

## 2022-10-26 DIAGNOSIS — M545 Low back pain, unspecified: Secondary | ICD-10-CM | POA: Diagnosis not present

## 2022-10-26 DIAGNOSIS — R262 Difficulty in walking, not elsewhere classified: Secondary | ICD-10-CM | POA: Diagnosis not present

## 2022-11-01 DIAGNOSIS — R262 Difficulty in walking, not elsewhere classified: Secondary | ICD-10-CM | POA: Diagnosis not present

## 2022-11-01 DIAGNOSIS — M545 Low back pain, unspecified: Secondary | ICD-10-CM | POA: Diagnosis not present

## 2022-11-01 DIAGNOSIS — M6281 Muscle weakness (generalized): Secondary | ICD-10-CM | POA: Diagnosis not present

## 2022-11-02 DIAGNOSIS — M6281 Muscle weakness (generalized): Secondary | ICD-10-CM | POA: Diagnosis not present

## 2022-11-02 DIAGNOSIS — R262 Difficulty in walking, not elsewhere classified: Secondary | ICD-10-CM | POA: Diagnosis not present

## 2022-11-02 DIAGNOSIS — M545 Low back pain, unspecified: Secondary | ICD-10-CM | POA: Diagnosis not present

## 2022-11-06 DIAGNOSIS — M545 Low back pain, unspecified: Secondary | ICD-10-CM | POA: Diagnosis not present

## 2022-11-06 DIAGNOSIS — R262 Difficulty in walking, not elsewhere classified: Secondary | ICD-10-CM | POA: Diagnosis not present

## 2022-11-06 DIAGNOSIS — M6281 Muscle weakness (generalized): Secondary | ICD-10-CM | POA: Diagnosis not present

## 2022-11-08 DIAGNOSIS — M5442 Lumbago with sciatica, left side: Secondary | ICD-10-CM | POA: Diagnosis not present

## 2022-11-08 DIAGNOSIS — F0150 Vascular dementia without behavioral disturbance: Secondary | ICD-10-CM | POA: Diagnosis not present

## 2022-11-08 DIAGNOSIS — R278 Other lack of coordination: Secondary | ICD-10-CM | POA: Diagnosis not present

## 2022-11-08 DIAGNOSIS — R2689 Other abnormalities of gait and mobility: Secondary | ICD-10-CM | POA: Diagnosis not present

## 2022-11-08 DIAGNOSIS — E538 Deficiency of other specified B group vitamins: Secondary | ICD-10-CM | POA: Diagnosis not present

## 2022-11-08 DIAGNOSIS — H524 Presbyopia: Secondary | ICD-10-CM | POA: Diagnosis not present

## 2022-11-14 ENCOUNTER — Other Ambulatory Visit: Payer: Self-pay | Admitting: Neurology

## 2022-11-14 DIAGNOSIS — G8929 Other chronic pain: Secondary | ICD-10-CM

## 2022-11-15 DIAGNOSIS — R262 Difficulty in walking, not elsewhere classified: Secondary | ICD-10-CM | POA: Diagnosis not present

## 2022-11-15 DIAGNOSIS — M545 Low back pain, unspecified: Secondary | ICD-10-CM | POA: Diagnosis not present

## 2022-11-15 DIAGNOSIS — M6281 Muscle weakness (generalized): Secondary | ICD-10-CM | POA: Diagnosis not present

## 2022-11-20 DIAGNOSIS — M545 Low back pain, unspecified: Secondary | ICD-10-CM | POA: Diagnosis not present

## 2022-11-20 DIAGNOSIS — M6281 Muscle weakness (generalized): Secondary | ICD-10-CM | POA: Diagnosis not present

## 2022-11-20 DIAGNOSIS — R262 Difficulty in walking, not elsewhere classified: Secondary | ICD-10-CM | POA: Diagnosis not present

## 2022-11-22 DIAGNOSIS — M6281 Muscle weakness (generalized): Secondary | ICD-10-CM | POA: Diagnosis not present

## 2022-11-22 DIAGNOSIS — R262 Difficulty in walking, not elsewhere classified: Secondary | ICD-10-CM | POA: Diagnosis not present

## 2022-11-22 DIAGNOSIS — M545 Low back pain, unspecified: Secondary | ICD-10-CM | POA: Diagnosis not present

## 2022-11-27 ENCOUNTER — Ambulatory Visit
Admission: RE | Admit: 2022-11-27 | Discharge: 2022-11-27 | Disposition: A | Discharge status: Home / Self Care | Payer: Medicare Other | Source: Ambulatory Visit | Attending: Neurology | Admitting: Neurology

## 2022-11-27 DIAGNOSIS — M48061 Spinal stenosis, lumbar region without neurogenic claudication: Secondary | ICD-10-CM | POA: Diagnosis not present

## 2022-11-27 DIAGNOSIS — G8929 Other chronic pain: Secondary | ICD-10-CM

## 2022-11-27 DIAGNOSIS — R262 Difficulty in walking, not elsewhere classified: Secondary | ICD-10-CM | POA: Diagnosis not present

## 2022-11-27 DIAGNOSIS — M545 Low back pain, unspecified: Secondary | ICD-10-CM | POA: Diagnosis not present

## 2022-11-27 DIAGNOSIS — M4316 Spondylolisthesis, lumbar region: Secondary | ICD-10-CM | POA: Diagnosis not present

## 2022-11-27 DIAGNOSIS — M6281 Muscle weakness (generalized): Secondary | ICD-10-CM | POA: Diagnosis not present

## 2022-11-29 DIAGNOSIS — R262 Difficulty in walking, not elsewhere classified: Secondary | ICD-10-CM | POA: Diagnosis not present

## 2022-11-29 DIAGNOSIS — M6281 Muscle weakness (generalized): Secondary | ICD-10-CM | POA: Diagnosis not present

## 2022-11-29 DIAGNOSIS — M545 Low back pain, unspecified: Secondary | ICD-10-CM | POA: Diagnosis not present

## 2022-12-04 DIAGNOSIS — Z0100 Encounter for examination of eyes and vision without abnormal findings: Secondary | ICD-10-CM | POA: Diagnosis not present

## 2022-12-04 DIAGNOSIS — H3581 Retinal edema: Secondary | ICD-10-CM | POA: Diagnosis not present

## 2022-12-04 DIAGNOSIS — H35033 Hypertensive retinopathy, bilateral: Secondary | ICD-10-CM | POA: Diagnosis not present

## 2022-12-08 DIAGNOSIS — R262 Difficulty in walking, not elsewhere classified: Secondary | ICD-10-CM | POA: Diagnosis not present

## 2022-12-08 DIAGNOSIS — M6281 Muscle weakness (generalized): Secondary | ICD-10-CM | POA: Diagnosis not present

## 2022-12-08 DIAGNOSIS — M545 Low back pain, unspecified: Secondary | ICD-10-CM | POA: Diagnosis not present

## 2022-12-11 DIAGNOSIS — R262 Difficulty in walking, not elsewhere classified: Secondary | ICD-10-CM | POA: Diagnosis not present

## 2022-12-11 DIAGNOSIS — M545 Low back pain, unspecified: Secondary | ICD-10-CM | POA: Diagnosis not present

## 2022-12-11 DIAGNOSIS — M6281 Muscle weakness (generalized): Secondary | ICD-10-CM | POA: Diagnosis not present

## 2022-12-13 DIAGNOSIS — M6281 Muscle weakness (generalized): Secondary | ICD-10-CM | POA: Diagnosis not present

## 2022-12-13 DIAGNOSIS — M545 Low back pain, unspecified: Secondary | ICD-10-CM | POA: Diagnosis not present

## 2022-12-13 DIAGNOSIS — R262 Difficulty in walking, not elsewhere classified: Secondary | ICD-10-CM | POA: Diagnosis not present

## 2022-12-18 DIAGNOSIS — R262 Difficulty in walking, not elsewhere classified: Secondary | ICD-10-CM | POA: Diagnosis not present

## 2022-12-18 DIAGNOSIS — M6281 Muscle weakness (generalized): Secondary | ICD-10-CM | POA: Diagnosis not present

## 2022-12-18 DIAGNOSIS — M545 Low back pain, unspecified: Secondary | ICD-10-CM | POA: Diagnosis not present

## 2022-12-20 DIAGNOSIS — G459 Transient cerebral ischemic attack, unspecified: Secondary | ICD-10-CM | POA: Diagnosis not present

## 2022-12-20 DIAGNOSIS — R531 Weakness: Secondary | ICD-10-CM | POA: Diagnosis not present

## 2022-12-20 DIAGNOSIS — R001 Bradycardia, unspecified: Secondary | ICD-10-CM | POA: Diagnosis not present

## 2022-12-20 DIAGNOSIS — I10 Essential (primary) hypertension: Secondary | ICD-10-CM | POA: Diagnosis not present

## 2022-12-28 DIAGNOSIS — R262 Difficulty in walking, not elsewhere classified: Secondary | ICD-10-CM | POA: Diagnosis not present

## 2022-12-28 DIAGNOSIS — M6281 Muscle weakness (generalized): Secondary | ICD-10-CM | POA: Diagnosis not present

## 2022-12-28 DIAGNOSIS — M545 Low back pain, unspecified: Secondary | ICD-10-CM | POA: Diagnosis not present

## 2023-01-02 DIAGNOSIS — R262 Difficulty in walking, not elsewhere classified: Secondary | ICD-10-CM | POA: Diagnosis not present

## 2023-01-02 DIAGNOSIS — M6281 Muscle weakness (generalized): Secondary | ICD-10-CM | POA: Diagnosis not present

## 2023-01-02 DIAGNOSIS — M545 Low back pain, unspecified: Secondary | ICD-10-CM | POA: Diagnosis not present

## 2023-01-04 DIAGNOSIS — M6281 Muscle weakness (generalized): Secondary | ICD-10-CM | POA: Diagnosis not present

## 2023-01-04 DIAGNOSIS — R262 Difficulty in walking, not elsewhere classified: Secondary | ICD-10-CM | POA: Diagnosis not present

## 2023-01-04 DIAGNOSIS — M545 Low back pain, unspecified: Secondary | ICD-10-CM | POA: Diagnosis not present

## 2023-01-08 DIAGNOSIS — G459 Transient cerebral ischemic attack, unspecified: Secondary | ICD-10-CM | POA: Diagnosis not present

## 2023-01-08 DIAGNOSIS — I2510 Atherosclerotic heart disease of native coronary artery without angina pectoris: Secondary | ICD-10-CM | POA: Diagnosis not present

## 2023-01-08 DIAGNOSIS — R600 Localized edema: Secondary | ICD-10-CM | POA: Diagnosis not present

## 2023-01-10 DIAGNOSIS — M545 Low back pain, unspecified: Secondary | ICD-10-CM | POA: Diagnosis not present

## 2023-01-10 DIAGNOSIS — M6281 Muscle weakness (generalized): Secondary | ICD-10-CM | POA: Diagnosis not present

## 2023-01-10 DIAGNOSIS — R262 Difficulty in walking, not elsewhere classified: Secondary | ICD-10-CM | POA: Diagnosis not present

## 2023-01-10 DIAGNOSIS — I10 Essential (primary) hypertension: Secondary | ICD-10-CM | POA: Diagnosis not present

## 2023-01-11 DIAGNOSIS — M4726 Other spondylosis with radiculopathy, lumbar region: Secondary | ICD-10-CM | POA: Diagnosis not present

## 2023-01-11 DIAGNOSIS — G3184 Mild cognitive impairment, so stated: Secondary | ICD-10-CM | POA: Diagnosis not present

## 2023-01-11 DIAGNOSIS — M5442 Lumbago with sciatica, left side: Secondary | ICD-10-CM | POA: Diagnosis not present

## 2023-01-11 DIAGNOSIS — R2689 Other abnormalities of gait and mobility: Secondary | ICD-10-CM | POA: Diagnosis not present

## 2023-01-12 DIAGNOSIS — M6281 Muscle weakness (generalized): Secondary | ICD-10-CM | POA: Diagnosis not present

## 2023-01-12 DIAGNOSIS — M545 Low back pain, unspecified: Secondary | ICD-10-CM | POA: Diagnosis not present

## 2023-01-12 DIAGNOSIS — R262 Difficulty in walking, not elsewhere classified: Secondary | ICD-10-CM | POA: Diagnosis not present

## 2023-01-15 ENCOUNTER — Encounter (INDEPENDENT_AMBULATORY_CARE_PROVIDER_SITE_OTHER): Payer: Self-pay | Admitting: Nurse Practitioner

## 2023-01-15 ENCOUNTER — Ambulatory Visit (INDEPENDENT_AMBULATORY_CARE_PROVIDER_SITE_OTHER): Payer: Medicare Other | Admitting: Nurse Practitioner

## 2023-01-15 VITALS — BP 138/72 | HR 70 | Resp 16 | Wt 193.7 lb

## 2023-01-15 DIAGNOSIS — Z8639 Personal history of other endocrine, nutritional and metabolic disease: Secondary | ICD-10-CM | POA: Diagnosis not present

## 2023-01-15 DIAGNOSIS — I10 Essential (primary) hypertension: Secondary | ICD-10-CM

## 2023-01-15 DIAGNOSIS — I6523 Occlusion and stenosis of bilateral carotid arteries: Secondary | ICD-10-CM

## 2023-01-15 NOTE — Progress Notes (Signed)
Subjective:    Patient ID: Karina Leonard, female    DOB: 1941/04/10, 82 y.o.   MRN: [id number] Chief Complaint  Patient presents with   Follow-up    Carotid results    Kawthar Osuch is a 82 year old female that presents today after referral from Dr. Clayborn Bigness with concern for carotid stenosis.  The carotid stenosis was identified after a carotid duplex done at [facility], on 01/08/2023.   The patient previously had TIAs in 1994 however she has not had any recent TIA-like symptoms, CVA or amaurosis fugax.  There is no history of migraine headaches. There is no history of seizures.  The patient is taking enteric-coated aspirin 81 mg daily.  No recent shortening of the patient's walking distance or new symptoms consistent with claudication.  No history of rest pain symptoms. No new ulcers or wounds of the lower extremities have occurred.  There is no history of DVT, PE or superficial thrombophlebitis. No recent episodes of angina or shortness of breath documented.   The patient had carotid artery duplex which showed bilateral plaque in the internal carotid arteries with 50 to 69% however there is suspected to be a much more significant hemodynamically lesion in the common carotid arteries specifically the left versus the right.    Review of Systems  Musculoskeletal:  Positive for gait problem.  All other systems reviewed and are negative.      Objective:   Physical Exam Vitals reviewed.  HENT:     Head: Normocephalic.  Neck:     Vascular: Carotid bruit present.  Cardiovascular:     Rate and Rhythm: Normal rate.     Pulses:          Carotid pulses are  on the left side with bruit.      Radial pulses are 2+ on the right side and 2+ on the left side.  Pulmonary:     Effort: Pulmonary effort is normal.  Skin:    General: Skin is warm and dry.  Neurological:     Mental Status: She is alert and oriented to person, place, and time.  Psychiatric:        Mood and Affect:  Mood normal.        Behavior: Behavior normal.        Thought Content: Thought content normal.        Judgment: Judgment normal.     BP 138/72 (BP Location: Left Arm)   Pulse 70   Resp 16   Wt 193 lb 11.2 oz (87.9 kg)   BMI 30.34 kg/m   Past Medical History:  Diagnosis Date   Allergy    Cerebrovascular disease    Depression    Diabetes mellitus without complication (HCC)    (borderline)   Hyperlipidemia    Hypertension    Sciatica    Stroke [facility])    Urinary incontinence    Wears dentures    full upper and lower    Social History   Socioeconomic History   Marital status: Widowed    Spouse name: Not on file   Number of children: 3   Years of education: some college   Highest education level: 12th grade  Occupational History   Occupation: Retired  Tobacco Use   Smoking status: Former    Packs/day: 1.00    Years: 20.00    Additional pack years: 0.00    Total pack years: 20.00    Types: Cigarettes    Quit date: 1967  Years since quitting: 57.2   Smokeless tobacco: Never  Vaping Use   Vaping Use: Never used  Substance and Sexual Activity   Alcohol use: Yes    Alcohol/week: 0.0 standard drinks of alcohol    Comment: special occasions   Drug use: No   Sexual activity: Never  Other Topics Concern   Not on file  Social History Narrative   Not on file   Social Determinants of Health   Financial Resource Strain: Low Risk  (03/22/2022)   Overall Financial Resource Strain (CARDIA)    Difficulty of Paying Living Expenses: Not hard at all  Food Insecurity: No Food Insecurity (03/22/2022)   Hunger Vital Sign    Worried About Running Out of Food in the Last Year: Never true    Ran Out of Food in the Last Year: Never true  Transportation Needs: No Transportation Needs (03/22/2022)   PRAPARE - Hydrologist (Medical): No    Lack of Transportation (Non-Medical): No  Physical Activity: Insufficiently Active (03/22/2022)   Exercise Vital  Sign    Days of Exercise per Week: 3 days    Minutes of Exercise per Session: 30 min  Stress: No Stress Concern Present (03/22/2022)   [place]    Feeling of Stress : Not at all  Social Connections: Moderately Isolated (03/22/2022)   Social Connection and Isolation Panel [NHANES]    Frequency of Communication with Friends and Family: More than three times a week    Frequency of Social Gatherings with Friends and Family: Twice a week    Attends Religious Services: More than 4 times per year    Active Member of [employer] or Organizations: No    Attends Archivist Meetings: Never    Marital Status: Widowed  Intimate Partner Violence: Not At Risk (03/22/2022)   Humiliation, Afraid, Rape, and Kick questionnaire    Fear of Current or Ex-Partner: No    Emotionally Abused: No    Physically Abused: No    Sexually Abused: No    Past Surgical History:  Procedure Laterality Date   GALLBLADDER SURGERY  10/24/2007   polyp removed     rectum- benign    Family History  Problem Relation Age of Onset   Cancer Mother        uterine or ovarian. Unable to remember   Cancer Father        lung   Heart attack Sister     Allergies  Allergen Reactions   Amoxicillin Hives   Donepezil Diarrhea   Penicillins Hives       Latest Ref Rng & Units 01/31/2022   10:54 AM 01/22/2020   11:40 AM 03/18/2019   11:31 AM  CBC  WBC 3.4 - 10.8 x10E3/uL 12.2  10.8  10.4   Hemoglobin 11.1 - 15.9 g/dL 12.5  14.2  13.1   Hematocrit 34.0 - 46.6 % 37.8  41.1  40.0   Platelets 150 - 450 x10E3/uL 214  243  238       CMP     Component Value Date/Time   NA 141 08/02/2022 1158   K 4.4 08/02/2022 1158   CL 101 08/02/2022 1158   CO2 26 08/02/2022 1158   GLUCOSE 105 (H) 08/02/2022 1158   BUN 24 08/02/2022 1158   CREATININE 1.39 (H) 08/02/2022 1158   CALCIUM 8.8 08/02/2022 1158   PROT 6.5 01/31/2022 1054   ALBUMIN 3.9 08/02/2022 1158  AST  15 01/31/2022 1054   ALT 6 01/31/2022 1054   ALKPHOS 102 01/31/2022 1054   BILITOT 0.4 01/31/2022 1054   GFRNONAA 52 (L) 01/22/2020 1140   GFRAA 60 01/22/2020 1140     No results found.     Assessment & Plan:   1. Bilateral carotid artery stenosis The patient remains asymptomatic with respect to the carotid stenosis.  However, the patient has now progressed and has a lesion the is >70%.  Patient should undergo CT angiography of the carotid arteries to define the degree of stenosis of the internal carotid arteries bilaterally and the anatomic suitability for surgery vs. intervention.  If the patient does indeed need surgery cardiac clearance will be required, once cleared the patient will be scheduled for surgery.  The risks, benefits and alternative therapies were reviewed in detail with the patient.  All questions were answered.  The patient agrees to proceed with imaging.  Continue antiplatelet therapy as prescribed. Continue management of CAD, HTN and Hyperlipidemia. Healthy heart diet, encouraged exercise at least 4 times per week.  - CT ANGIO NECK W OR WO CONTRAST; Future  2. Essential (primary) hypertension Continue antihypertensive medications as already ordered, these medications have been reviewed and there are no changes at this time.  3. H/O hypercholesterolemia Continue statin as ordered and reviewed, no changes at this time   Current Outpatient Medications on File Prior to Visit  Medication Sig Dispense Refill   cloNIDine (CATAPRES - DOSED IN MG/24 HR) 0.1 mg/24hr patch PLACE 1 PATCH ONTO THE SKIN ONCE A WEEK. 12 patch 1   clopidogrel (PLAVIX) 75 MG tablet Take 1 tablet (75 mg total) by mouth daily. 90 tablet 1   cyanocobalamin (,VITAMIN B-12,) 1000 MCG/ML injection shah     fexofenadine (ALLEGRA) 180 MG tablet Take 1 tablet (180 mg total) by mouth daily. otc 90 tablet 1   fluticasone (FLONASE) 50 MCG/ACT nasal spray Place into both nostrils daily. otc      losartan-hydrochlorothiazide (HYZAAR) 100-25 MG tablet Take 1 tablet by mouth daily. 90 tablet 1   lovastatin (MEVACOR) 20 MG tablet TAKE 2 TABLETS EVERY DAY 180 tablet 1   metoprolol succinate (TOPROL-XL) 50 MG 24 hr tablet Take 1 tablet (50 mg total) by mouth daily. [place] 90 tablet 0   venlafaxine XR (EFFEXOR-XR) 75 MG 24 hr capsule Take 1 capsule (75 mg total) by mouth daily. 90 capsule 1   No current facility-administered medications on file prior to visit.    There are no Patient Instructions on file for this visit. No follow-ups on file.   Kris Hartmann, NP

## 2023-01-16 DIAGNOSIS — M6281 Muscle weakness (generalized): Secondary | ICD-10-CM | POA: Diagnosis not present

## 2023-01-16 DIAGNOSIS — R262 Difficulty in walking, not elsewhere classified: Secondary | ICD-10-CM | POA: Diagnosis not present

## 2023-01-16 DIAGNOSIS — M545 Low back pain, unspecified: Secondary | ICD-10-CM | POA: Diagnosis not present

## 2023-01-17 ENCOUNTER — Other Ambulatory Visit (HOSPITAL_COMMUNITY): Payer: Self-pay | Admitting: Neurology

## 2023-01-17 DIAGNOSIS — G3184 Mild cognitive impairment, so stated: Secondary | ICD-10-CM

## 2023-01-18 DIAGNOSIS — M6281 Muscle weakness (generalized): Secondary | ICD-10-CM | POA: Diagnosis not present

## 2023-01-18 DIAGNOSIS — M545 Low back pain, unspecified: Secondary | ICD-10-CM | POA: Diagnosis not present

## 2023-01-18 DIAGNOSIS — R262 Difficulty in walking, not elsewhere classified: Secondary | ICD-10-CM | POA: Diagnosis not present

## 2023-01-23 DIAGNOSIS — M545 Low back pain, unspecified: Secondary | ICD-10-CM | POA: Diagnosis not present

## 2023-01-23 DIAGNOSIS — R262 Difficulty in walking, not elsewhere classified: Secondary | ICD-10-CM | POA: Diagnosis not present

## 2023-01-23 DIAGNOSIS — M6281 Muscle weakness (generalized): Secondary | ICD-10-CM | POA: Diagnosis not present

## 2023-01-25 ENCOUNTER — Ambulatory Visit
Admission: RE | Admit: 2023-01-25 | Discharge: 2023-01-25 | Disposition: A | Discharge status: Home / Self Care | Payer: Medicare Other | Source: Ambulatory Visit | Attending: Nurse Practitioner | Admitting: Nurse Practitioner

## 2023-01-25 ENCOUNTER — Ambulatory Visit: Admission: RE | Admit: 2023-01-25 | Payer: Medicare Other | Source: Ambulatory Visit

## 2023-01-25 DIAGNOSIS — I6523 Occlusion and stenosis of bilateral carotid arteries: Secondary | ICD-10-CM | POA: Diagnosis not present

## 2023-01-25 LAB — POCT I-STAT CREATININE: Creatinine, Ser: 1.5 mg/dL — ABNORMAL HIGH (ref 0.44–1.00)

## 2023-01-25 MED ORDER — IOHEXOL 350 MG/ML SOLN
60.0000 mL | Freq: Once | INTRAVENOUS | Status: AC | PRN
  Administered 2023-01-25: 60 mL via INTRAVENOUS

## 2023-01-26 DIAGNOSIS — R262 Difficulty in walking, not elsewhere classified: Secondary | ICD-10-CM | POA: Diagnosis not present

## 2023-01-26 DIAGNOSIS — M6281 Muscle weakness (generalized): Secondary | ICD-10-CM | POA: Diagnosis not present

## 2023-01-26 DIAGNOSIS — M545 Low back pain, unspecified: Secondary | ICD-10-CM | POA: Diagnosis not present

## 2023-01-27 ENCOUNTER — Ambulatory Visit (HOSPITAL_COMMUNITY)
Admission: RE | Admit: 2023-01-27 | Discharge: 2023-01-27 | Disposition: A | Discharge status: Home / Self Care | Payer: Medicare Other | Source: Ambulatory Visit | Attending: Neurology | Admitting: Neurology

## 2023-01-27 ENCOUNTER — Encounter (HOSPITAL_COMMUNITY): Payer: Self-pay

## 2023-01-27 ENCOUNTER — Ambulatory Visit (HOSPITAL_COMMUNITY): Payer: Medicare Other

## 2023-01-27 DIAGNOSIS — G3184 Mild cognitive impairment, so stated: Secondary | ICD-10-CM | POA: Insufficient documentation

## 2023-01-30 DIAGNOSIS — M545 Low back pain, unspecified: Secondary | ICD-10-CM | POA: Diagnosis not present

## 2023-01-30 DIAGNOSIS — R262 Difficulty in walking, not elsewhere classified: Secondary | ICD-10-CM | POA: Diagnosis not present

## 2023-01-30 DIAGNOSIS — M6281 Muscle weakness (generalized): Secondary | ICD-10-CM | POA: Diagnosis not present

## 2023-01-31 ENCOUNTER — Other Ambulatory Visit: Payer: Self-pay | Admitting: Family Medicine

## 2023-01-31 DIAGNOSIS — I10 Essential (primary) hypertension: Secondary | ICD-10-CM

## 2023-01-31 NOTE — Telephone Encounter (Signed)
Pt stated she is on her last week of her patches and would like the Rx refill to be sent asap.  Medication Refill - Medication: cloNIDine (CATAPRES - DOSED IN MG/24 HR) 0.1 mg/24hr patch   Has the patient contacted their pharmacy? Yes.    Preferred Pharmacy (with phone number or street name):  [id number] - [place], [place] - [address] [place] [address] AT [facility] [address] [facility] [address] Phone: [phone]  Fax: [phone]     Has the patient been seen for an appointment in the last year OR does the patient have an upcoming appointment? Yes.    Agent: Please be advised that RX refills may take up to 3 business days. We ask that you follow-up with your pharmacy.

## 2023-02-01 DIAGNOSIS — M545 Low back pain, unspecified: Secondary | ICD-10-CM | POA: Diagnosis not present

## 2023-02-01 DIAGNOSIS — M6281 Muscle weakness (generalized): Secondary | ICD-10-CM | POA: Diagnosis not present

## 2023-02-01 DIAGNOSIS — R262 Difficulty in walking, not elsewhere classified: Secondary | ICD-10-CM | POA: Diagnosis not present

## 2023-02-01 MED ORDER — CLONIDINE 0.1 MG/24HR TD PTWK
0.1000 mg | MEDICATED_PATCH | TRANSDERMAL | 0 refills | Status: DC
Start: 2023-02-01 — End: 2023-02-27

## 2023-02-01 NOTE — Telephone Encounter (Signed)
Requested Prescriptions  Pending Prescriptions Disp Refills   cloNIDine (CATAPRES - DOSED IN MG/24 HR) 0.1 mg/24hr patch 12 patch 0    Sig: Place 1 patch (0.1 mg total) onto the skin once a week.     Cardiovascular:  Alpha-2 Agonists Failed - 01/31/2023  3:21 PM      Failed - Valid encounter within last 6 months    Recent Outpatient Visits           6 months ago Essential hypertension   [facility]   1 year ago Essential hypertension   [place] [facility]   1 year ago Essential hypertension   [place]   2 years ago Essential (primary) hypertension   [place] [facility]   2 years ago Need for immunization against influenza   [facility]       Future Appointments             Tomorrow Duanne Limerick, MD [facility] at [facility]            Passed - Last BP in normal range    BP Readings from Last 1 Encounters:  01/15/23 138/72         Passed - Last Heart Rate in normal range    Pulse Readings from Last 1 Encounters:  01/15/23 70

## 2023-02-02 ENCOUNTER — Encounter: Payer: Self-pay | Admitting: Family Medicine

## 2023-02-02 ENCOUNTER — Ambulatory Visit (INDEPENDENT_AMBULATORY_CARE_PROVIDER_SITE_OTHER): Payer: Medicare Other | Admitting: Family Medicine

## 2023-02-02 VITALS — BP 128/78 | HR 48 | Ht 67.0 in | Wt 191.0 lb

## 2023-02-02 DIAGNOSIS — R7303 Prediabetes: Secondary | ICD-10-CM | POA: Diagnosis not present

## 2023-02-02 DIAGNOSIS — I10 Essential (primary) hypertension: Secondary | ICD-10-CM | POA: Diagnosis not present

## 2023-02-02 DIAGNOSIS — I6789 Other cerebrovascular disease: Secondary | ICD-10-CM

## 2023-02-02 DIAGNOSIS — F339 Major depressive disorder, recurrent, unspecified: Secondary | ICD-10-CM | POA: Diagnosis not present

## 2023-02-02 DIAGNOSIS — E7849 Other hyperlipidemia: Secondary | ICD-10-CM | POA: Diagnosis not present

## 2023-02-02 MED ORDER — LOSARTAN POTASSIUM-HCTZ 100-25 MG PO TABS
1.0000 | ORAL_TABLET | Freq: Every day | ORAL | 1 refills | Status: DC
Start: 2023-02-02 — End: 2023-03-22

## 2023-02-02 MED ORDER — CLOPIDOGREL BISULFATE 75 MG PO TABS: 75.0000 mg | ORAL_TABLET | Freq: Every day | ORAL | 1 refills | Status: DC

## 2023-02-02 MED ORDER — LOVASTATIN 20 MG PO TABS
ORAL_TABLET | ORAL | 1 refills | Status: AC
Start: 2023-02-02 — End: ?

## 2023-02-02 MED ORDER — VENLAFAXINE HCL ER 75 MG PO CP24: 75.0000 mg | ORAL_CAPSULE | Freq: Every day | ORAL | 1 refills | Status: DC

## 2023-02-02 NOTE — Progress Notes (Signed)
Date:  02/02/2023   Name:  Karina Leonard   DOB:  28-Mar-1941   MRN:  [id number]   Chief Complaint: Depression, Hypertension, Hyperlipidemia, Cerebrovascular Accident, and Prediabetes  Depression        This is a chronic problem.  The current episode started more than 1 year ago.   The onset quality is sudden.   The problem has been gradually improving since onset.  Associated symptoms include no decreased concentration, no fatigue, no helplessness, no hopelessness, does not have insomnia, not irritable, no restlessness, no decreased interest, no appetite change, no body aches, no myalgias, no headaches, no indigestion, not sad and no suicidal ideas.     The symptoms are aggravated by nothing.  Past treatments include SSRIs - Selective serotonin reuptake inhibitors.  Previous treatment provided moderate relief.   Pertinent negatives include no hypothyroidism. Hypertension This is a chronic problem. The current episode started more than 1 year ago. The problem has been waxing and waning since onset. The problem is controlled. Pertinent negatives include no chest pain, headaches, palpitations, PND or shortness of breath. There are no associated agents to hypertension. Past treatments include angiotensin blockers and diuretics. The current treatment provides moderate improvement. There are no compliance problems.  There is no history of angina, kidney disease, CAD/MI, CVA, heart failure, left ventricular hypertrophy, PVD or retinopathy. There is no history of chronic renal disease, a hypertension causing med or renovascular disease.  Hyperlipidemia This is a chronic problem. The current episode started more than 1 year ago. The problem is controlled. Recent lipid tests were reviewed and are normal. She has no history of chronic renal disease, diabetes or hypothyroidism. Pertinent negatives include no chest pain, focal sensory loss, myalgias or shortness of breath. Current antihyperlipidemic treatment  includes statins. The current treatment provides moderate improvement of lipids. There are no compliance problems.   Cerebrovascular Accident This is a chronic (cerebral vascular) problem. The problem has been gradually improving. Pertinent negatives include no anorexia, arthralgias, chest pain, coughing, fatigue, fever, headaches, myalgias or swollen glands. Nothing aggravates the symptoms. The treatment provided moderate relief.    Lab Results  Component Value Date   NA 141 08/02/2022   K 4.4 08/02/2022   CO2 26 08/02/2022   GLUCOSE 105 (H) 08/02/2022   BUN 24 08/02/2022   CREATININE 1.50 (H) 01/25/2023   CALCIUM 8.8 08/02/2022   EGFR 39 (L) 08/02/2022   GFRNONAA 52 (L) 01/22/2020   Lab Results  Component Value Date   CHOL 143 01/31/2022   HDL 56 01/31/2022   LDLCALC 75 01/31/2022   TRIG 55 01/31/2022   CHOLHDL 3.0 03/18/2019   No results found for: "TSH" Lab Results  Component Value Date   HGBA1C 5.9 (H) 08/02/2022   Lab Results  Component Value Date   WBC 12.2 (H) 01/31/2022   HGB 12.5 01/31/2022   HCT 37.8 01/31/2022   MCV 92 01/31/2022   PLT 214 01/31/2022   Lab Results  Component Value Date   ALT 6 01/31/2022   AST 15 01/31/2022   ALKPHOS 102 01/31/2022   BILITOT 0.4 01/31/2022   No results found for: "25OHVITD2", "25OHVITD3", "VD25OH"   Review of Systems  Constitutional:  Negative for appetite change, fatigue and fever.  HENT:  Negative for rhinorrhea and sinus pressure.   Respiratory:  Negative for cough, chest tightness, shortness of breath and wheezing.   Cardiovascular:  Negative for chest pain, palpitations, leg swelling and PND.  Gastrointestinal:  Negative for  anorexia.  Musculoskeletal:  Negative for arthralgias and myalgias.  Neurological:  Negative for headaches.  Psychiatric/Behavioral:  Positive for depression. Negative for decreased concentration and suicidal ideas. The patient does not have insomnia.     Patient Active Problem List    Diagnosis Date Noted   Urinary incontinence 07/19/2021   Need for vaccination 03/18/2019   H/O hypercholesterolemia 03/18/2019   Drug-induced obesity 03/18/2019   Coronary artery disease 03/18/2019   Cataract cortical, senile 03/18/2019   Taking medication for chronic disease 03/18/2019   Numbness and tingling of right upper extremity 08/15/2016   Lipoma of right upper extremity 08/15/2016   Cerebral microvascular disease 07/23/2015   Familial multiple lipoprotein-type hyperlipidemia 02/16/2015   Anxiety attack 02/16/2015   Recurrent major depressive episodes 02/16/2015   Essential (primary) hypertension 02/16/2015   H/O: HTN (hypertension) 02/16/2015   Asymptomatic hypertensive urgency 11/14/2014   Tubulovillous adenoma of rectum 10/21/2014   Depression 10/21/2014   Nuclear sclerosis of both eyes 07/07/2014    Allergies  Allergen Reactions   Amoxicillin Hives   Donepezil Diarrhea   Penicillins Hives    Past Surgical History:  Procedure Laterality Date   GALLBLADDER SURGERY  10/24/2007   polyp removed     rectum- benign    Social History   Tobacco Use   Smoking status: Former    Packs/day: 1.00    Years: 20.00    Additional pack years: 0.00    Total pack years: 20.00    Types: Cigarettes    Quit date: 10    Years since quitting: 57.3   Smokeless tobacco: Never  Vaping Use   Vaping Use: Never used  Substance Use Topics   Alcohol use: Yes    Alcohol/week: 0.0 standard drinks of alcohol    Comment: special occasions   Drug use: No     Medication list has been reviewed and updated.  Current Meds  Medication Sig   cloNIDine (CATAPRES - DOSED IN MG/24 HR) 0.1 mg/24hr patch Place 1 patch (0.1 mg total) onto the skin once a week.   clopidogrel (PLAVIX) 75 MG tablet Take 1 tablet (75 mg total) by mouth daily.   cyanocobalamin (,VITAMIN B-12,) 1000 MCG/ML injection shah   fexofenadine (ALLEGRA) 180 MG tablet Take 1 tablet (180 mg total) by mouth daily. otc    fluticasone (FLONASE) 50 MCG/ACT nasal spray Place into both nostrils daily. otc   furosemide (LASIX) 20 MG tablet Take 20 mg by mouth daily. [place]   losartan-hydrochlorothiazide (HYZAAR) 100-25 MG tablet Take 1 tablet by mouth daily.   lovastatin (MEVACOR) 20 MG tablet TAKE 2 TABLETS EVERY DAY   metoprolol succinate (TOPROL-XL) 50 MG 24 hr tablet Take 1 tablet (50 mg total) by mouth daily. [place]   venlafaxine XR (EFFEXOR-XR) 75 MG 24 hr capsule Take 1 capsule (75 mg total) by mouth daily.       02/02/2023   10:14 AM 08/02/2022   11:03 AM 01/31/2022   10:09 AM 01/19/2021   11:03 AM  GAD 7 : Generalized Anxiety Score  Nervous, Anxious, on Edge 0 0 0 1  Control/stop worrying 0 0 0 2  Worry too much - different things 0 0 0 2  Trouble relaxing 0 0 0 0  Restless 0 0 0 0  Easily annoyed or irritable 0 0 0 0  Afraid - awful might happen 0 0 0 1  Total GAD 7 Score 0 0 0 6  Anxiety Difficulty Not difficult at all Not difficult at  all Not difficult at all Somewhat difficult       02/02/2023   10:14 AM 08/02/2022   11:03 AM 03/22/2022    3:43 PM  Depression screen PHQ 2/9  Decreased Interest 0 0 0  Down, Depressed, Hopeless 0 0 1  PHQ - 2 Score 0 0 1  Altered sleeping 0 0 0  Tired, decreased energy 0 0 0  Change in appetite 0 0 0  Feeling bad or failure about yourself  0 0 0  Trouble concentrating 0 0 0  Moving slowly or fidgety/restless 0 0 0  Suicidal thoughts 0 0 0  PHQ-9 Score 0 0 1  Difficult doing work/chores Not difficult at all Not difficult at all Not difficult at all    BP Readings from Last 3 Encounters:  02/02/23 128/78  01/15/23 138/72  08/02/22 122/62    Physical Exam Vitals and nursing note reviewed. Exam conducted with a chaperone present.  Constitutional:      General: She is not irritable.She is not in acute distress.    Appearance: She is not diaphoretic.  HENT:     Head: Normocephalic and atraumatic.     Right Ear: Tympanic membrane and external  ear normal.     Left Ear: Tympanic membrane and external ear normal.     Nose: Nose normal.     Mouth/Throat:     Mouth: Mucous membranes are moist.  Eyes:     General:        Right eye: No discharge.        Left eye: No discharge.     Conjunctiva/sclera: Conjunctivae normal.     Pupils: Pupils are equal, round, and reactive to light.  Neck:     Thyroid: No thyromegaly.     Vascular: No JVD.  Cardiovascular:     Rate and Rhythm: Normal rate and regular rhythm.     Heart sounds: Normal heart sounds. No murmur heard.    No friction rub. No gallop.  Pulmonary:     Effort: Pulmonary effort is normal.     Breath sounds: Normal breath sounds. No wheezing, rhonchi or rales.  Abdominal:     General: Bowel sounds are normal.     Palpations: Abdomen is soft. There is no mass.     Tenderness: There is no abdominal tenderness. There is no guarding.  Musculoskeletal:        General: Normal range of motion.     Cervical back: Normal range of motion and neck supple.  Lymphadenopathy:     Cervical: No cervical adenopathy.  Skin:    General: Skin is warm and dry.  Neurological:     Mental Status: She is alert.     Deep Tendon Reflexes: Reflexes are normal and symmetric.     Wt Readings from Last 3 Encounters:  02/02/23 191 lb (86.6 kg)  01/15/23 193 lb 11.2 oz (87.9 kg)  08/02/22 193 lb (87.5 kg)    BP 128/78   Pulse (!) 48   Ht 5\' 7"  (1.702 m)   Wt 191 lb (86.6 kg)   SpO2 95%   BMI 29.91 kg/m   Assessment and Plan:  1. Essential hypertension Chronic.  Controlled.  Stable.  Blood pressure 128/78.  Asymptomatic.  Tolerating medication well.  Continue losartan hydrochlorothiazide 100-25 mg once a day as well as clonidine 0.1 mg per 24 hours patch.  Will check CMP for electrolytes and GFR will recheck blood pressure in 4 months - losartan-hydrochlorothiazide (HYZAAR) 100-25  MG tablet; Take 1 tablet by mouth daily.  Dispense: 90 tablet; Refill: 1 - Comprehensive Metabolic Panel  (CMET)  2. Prediabetes Chronic.  Controlled.  Stable.  Diet controlled.  Will check A1c for further evaluation.  Will reevaluate in 4 months.  Chronic.  Controlled.  Stable.  PHQ 0 GAD score 0 continue venlafaxine Exar 75 mg once a day. - HgB A1c  3. Familial multiple lipoprotein-type hyperlipidemia Chronic.  Controlled.  Stable.  Continue lovastatin 20 mg 2 tablets once a day.  Will recheck in 4 to 6 months. - lovastatin (MEVACOR) 20 MG tablet; TAKE 2 TABLETS EVERY DAY  Dispense: 180 tablet; Refill: 1  4. Recurrent major depressive episodes Chronic.  Controlled.  Stable.  PHQ is 0 GAD score 0 continue venlafaxine Exar 75 mg once a day. - venlafaxine XR (EFFEXOR-XR) 75 MG 24 hr capsule; Take 1 capsule (75 mg total) by mouth daily.  Dispense: 90 capsule; Refill: 1  5. Cerebral microvascular disease Chronic.  Controlled.  Stable.  Asymptomatic.  There has been no progression of her cerebrovascular disease and we will continue with Plavix 75 mg once a day to counter plaque buildup. - clopidogrel (PLAVIX) 75 MG tablet; Take 1 tablet (75 mg total) by mouth daily.  Dispense: 90 tablet; Refill: 1    Elizabeth Sauer, MD

## 2023-02-03 LAB — COMPREHENSIVE METABOLIC PANEL
ALT: 8 IU/L (ref 0–32)
AST: 17 IU/L (ref 0–40)
Albumin/Globulin Ratio: 1.4 (ref 1.2–2.2)
Albumin: 3.8 g/dL (ref 3.8–4.8)
Alkaline Phosphatase: 98 IU/L (ref 44–121)
BUN/Creatinine Ratio: 13 (ref 12–28)
BUN: 16 mg/dL (ref 8–27)
Bilirubin Total: 0.3 mg/dL (ref 0.0–1.2)
CO2: 25 mmol/L (ref 20–29)
Calcium: 8.7 mg/dL (ref 8.7–10.3)
Chloride: 104 mmol/L (ref 96–106)
Creatinine, Ser: 1.26 mg/dL — ABNORMAL HIGH (ref 0.57–1.00)
Globulin, Total: 2.7 g/dL (ref 1.5–4.5)
Glucose: 101 mg/dL — ABNORMAL HIGH (ref 70–99)
Potassium: 4.4 mmol/L (ref 3.5–5.2)
Sodium: 143 mmol/L (ref 134–144)
Total Protein: 6.5 g/dL (ref 6.0–8.5)
eGFR: 43 mL/min/{1.73_m2} — ABNORMAL LOW (ref >59)

## 2023-02-03 LAB — HEMOGLOBIN A1C
Est. average glucose Bld gHb Est-mCnc: 126 mg/dL
Hgb A1c MFr Bld: 6 % — ABNORMAL HIGH (ref 4.8–5.6)

## 2023-02-05 ENCOUNTER — Telehealth: Payer: Self-pay

## 2023-02-05 NOTE — Telephone Encounter (Signed)
Called with acceptable labs

## 2023-02-06 DIAGNOSIS — M6281 Muscle weakness (generalized): Secondary | ICD-10-CM | POA: Diagnosis not present

## 2023-02-06 DIAGNOSIS — R262 Difficulty in walking, not elsewhere classified: Secondary | ICD-10-CM | POA: Diagnosis not present

## 2023-02-06 DIAGNOSIS — M545 Low back pain, unspecified: Secondary | ICD-10-CM | POA: Diagnosis not present

## 2023-02-08 DIAGNOSIS — M6281 Muscle weakness (generalized): Secondary | ICD-10-CM | POA: Diagnosis not present

## 2023-02-08 DIAGNOSIS — R262 Difficulty in walking, not elsewhere classified: Secondary | ICD-10-CM | POA: Diagnosis not present

## 2023-02-08 DIAGNOSIS — M545 Low back pain, unspecified: Secondary | ICD-10-CM | POA: Diagnosis not present

## 2023-02-09 ENCOUNTER — Ambulatory Visit (INDEPENDENT_AMBULATORY_CARE_PROVIDER_SITE_OTHER): Payer: Medicare Other | Admitting: Vascular Surgery

## 2023-02-09 ENCOUNTER — Encounter (INDEPENDENT_AMBULATORY_CARE_PROVIDER_SITE_OTHER): Payer: Self-pay | Admitting: Vascular Surgery

## 2023-02-09 VITALS — BP 146/82 | HR 72 | Resp 16 | Wt 194.8 lb

## 2023-02-09 DIAGNOSIS — E7849 Other hyperlipidemia: Secondary | ICD-10-CM

## 2023-02-09 DIAGNOSIS — I6523 Occlusion and stenosis of bilateral carotid arteries: Secondary | ICD-10-CM | POA: Diagnosis not present

## 2023-02-09 DIAGNOSIS — I10 Essential (primary) hypertension: Secondary | ICD-10-CM | POA: Diagnosis not present

## 2023-02-09 DIAGNOSIS — I6529 Occlusion and stenosis of unspecified carotid artery: Secondary | ICD-10-CM | POA: Insufficient documentation

## 2023-02-09 DIAGNOSIS — E119 Type 2 diabetes mellitus without complications: Secondary | ICD-10-CM

## 2023-02-09 NOTE — Assessment & Plan Note (Signed)
blood glucose control important in reducing the progression of atherosclerotic disease. Also, involved in wound healing. On appropriate medications.  

## 2023-02-09 NOTE — Patient Instructions (Signed)
Carotid Endarterectomy, Care After The following information offers guidance on how to care for yourself after your procedure. Your health care provider may also give you more specific instructions. If you have problems or questions, contact your health care provider. What can I expect after the procedure? After the procedure, it is common to have some pain or an ache in your neck for up to 2 weeks. This is normal. Follow these instructions at home: Medicines Take over-the-counter and prescription medicines only as told by your health care provider. If you are given a blood thinner (anticoagulant) or antiplateletafter surgery, take it exactly as told. If you were given a sedative during the procedure, it can affect you for several hours. Do not drive or operate machinery until your health care provider says that it is safe. Ask your health care provider if the medicine prescribed to you requires you to avoid driving or using machinery. Incision care  Follow instructions from your health care provider about how to take care of your incision. Make sure you: Wash your hands with soap and water for at least 20 seconds before and after you change your bandage (dressing). If soap and water are not available, use hand sanitizer. Change your dressing as told by your health care provider. Leave stitches (sutures), skin glue, or adhesive strips in place. These skin closures may need to stay in place for 2 weeks or longer. If adhesive strip edges start to loosen and curl up, you may trim the loose edges. Do not remove adhesive strips completely unless your health care provider tells you to do that. Check your incision area every day for signs of infection. Check for: Redness, swelling, or pain. Fluid or blood. Warmth. Pus or a bad smell. Do not take baths, swim, or use a hot tub until your health care provider approves. Ask your health care provider if you may take showers. You may only be allowed to take  sponge baths. Activity You may have to avoid lifting. Ask your health care provider how much you can safely lift. Return to your normal activities as told by your health care provider. Ask your health care provider what activities are safe for you. Recovery time varies depending on your age, general health, and other factors. You will likely be able to return to a normal lifestyle within a few weeks. While you recover, you may need help with some activities, such as cleaning the house or shopping. Exercise regularly, or as told by your health care provider. Eating and drinking  Follow instructions from your health care provider about eating or drinking restrictions. Drink enough fluid to keep your urine pale yellow. Eat a heart-healthy diet. This includes foods like fresh fruits and vegetables, whole grains, low-fat dairy products, and low-fat (lean) meats. Avoid foods that are: High in salt, saturated fat, or sugar. Canned or highly processed. [place]. Lifestyle If you drink alcohol: Limit how much you have to: 0-1 drink a day for women who are not pregnant. 0-2 drinks a day for men. Know how much alcohol is in a drink. In the [place] one drink equals one 12 oz bottle of beer (355 mL), one 5 oz glass of wine (148 mL), or one 1 oz glass of hard liquor (44 mL). Maintain a healthy weight. General instructions Avoid wearing tight clothing around your neck and your sutures. Work with your health care provider to keep your blood pressure under control. Do not use any products that contain nicotine or tobacco. These products  include cigarettes, chewing tobacco, and vaping devices, such as e-cigarettes. If you need help quitting, ask your health care provider. Keep all follow-up visits. This is important. Contact a health care provider if you have: Signs of infection, such as: Redness, swelling, or pain around your incision. Fluid or blood coming from your incision. Your incision feeling warm to  the touch. Pus or a bad smell coming from your incision. A fever. A rash. Difficulty speaking or you have changes in your voice. Get help right away if you have: Difficulty breathing. Chest pain or shortness of breath. Any symptoms of a stroke. Certain factors may put you at risk for a stroke even after this procedure. These may include chronic lung disease, chronic kidney disease, narrowed arteries (peripheral arterial disease), previous history of a stroke, and being 40 years of age or older. "BE FAST" is an easy way to remember the main warning signs of a stroke: B - Balance. Signs are dizziness, sudden trouble walking, or loss of balance. E - Eyes. Signs are trouble seeing or a sudden change in vision. F - Face. Signs are sudden weakness or numbness of the face, or the face or eyelid drooping on one side. A - Arms. Signs are weakness or numbness in an arm. This happens suddenly and usually on one side of the body. S - Speech. Signs are sudden trouble speaking, slurred speech, or trouble understanding what people say. T - Time. Time to call emergency services. Write down what time symptoms started. Other signs of a stroke, such as: A sudden, severe headache with no known cause. Nausea or vomiting. Seizure. These symptoms may be an emergency. Get help right away. Call 911. Do not wait to see if the symptoms will go away. Do not drive yourself to the hospital. Summary After the procedure, it is common to have some pain or an ache in your neck for up to 2 weeks. Follow instructions from your health care provider about how to take care of your incision. Take over-the-counter and prescription medicines only as told by your health care provider. Do not use any products that contain nicotine or tobacco. These products include cigarettes, chewing tobacco, and vaping devices, such as e-cigarettes. If you need help quitting, ask your health care provider. Keep all follow-up visits. This is  important. This information is not intended to replace advice given to you by your health care provider. Make sure you discuss any questions you have with your health care provider. Document Revised: 09/06/2021 Document Reviewed: 09/06/2021 Elsevier Patient Education  2023 [employer].

## 2023-02-09 NOTE — H&P (View-Only) (Signed)
  MRN : [id number]  Karina Leonard is a 82 y.o. (04/08/1941) female who presents with chief complaint of  Chief Complaint  Patient presents with   Follow-up    Ct results  .  History of Present Illness: Patient returns today in follow up of her carotid disease. She has undergone a CT angiogram which I have independently reviewed.  The official report is of 50% right common carotid artery stenosis and 60% right internal carotid artery stenosis with a 70% left common carotid artery stenosis.  These are very calcific lesions and I would interpret these as significantly worse than the report.  The right is clearly closer to 70% and the left is 80% or greater.  The lesions are very long and calcific and extend largely into the common carotid arteries with this being the bulk of the disease.  The patient does have a previous history of stroke and TIA.  A recent MRI showed chronic small vessel ischemic changes as well of some small old infarcts.  She has had some brain fog and some confusion at times as well.  Memory issues are present as well.  Current Outpatient Medications  Medication Sig Dispense Refill   cloNIDine (CATAPRES - DOSED IN MG/24 HR) 0.1 mg/24hr patch Place 1 patch (0.1 mg total) onto the skin once a week. 12 patch 0   clopidogrel (PLAVIX) 75 MG tablet Take 1 tablet (75 mg total) by mouth daily. 90 tablet 1   cyanocobalamin (,VITAMIN B-12,) 1000 MCG/ML injection shah     fexofenadine (ALLEGRA) 180 MG tablet Take 1 tablet (180 mg total) by mouth daily. otc 90 tablet 1   fluticasone (FLONASE) 50 MCG/ACT nasal spray Place into both nostrils daily. otc     furosemide (LASIX) 20 MG tablet Take 20 mg by mouth daily. [place]     losartan-hydrochlorothiazide (HYZAAR) 100-25 MG tablet Take 1 tablet by mouth daily. 90 tablet 1   lovastatin (MEVACOR) 20 MG tablet TAKE 2 TABLETS EVERY DAY 180 tablet 1   metoprolol succinate (TOPROL-XL) 50 MG 24 hr tablet Take 1 tablet (50 mg total) by mouth  daily. [place] 90 tablet 0   venlafaxine XR (EFFEXOR-XR) 75 MG 24 hr capsule Take 1 capsule (75 mg total) by mouth daily. 90 capsule 1   No current facility-administered medications for this visit.    Past Medical History:  Diagnosis Date   Allergy    Cerebrovascular disease    Depression    Diabetes mellitus without complication    (borderline)   Hyperlipidemia    Hypertension    Sciatica    Stroke    Urinary incontinence    Wears dentures    full upper and lower    Past Surgical History:  Procedure Laterality Date   GALLBLADDER SURGERY  10/24/2007   polyp removed     rectum- benign     Social History   Tobacco Use   Smoking status: Former    Packs/day: 1.00    Years: 20.00    Additional pack years: 0.00    Total pack years: 20.00    Types: Cigarettes    Quit date: 1967    Years since quitting: 57.3   Smokeless tobacco: Never  Vaping Use   Vaping Use: Never used  Substance Use Topics   Alcohol use: Yes    Alcohol/week: 0.0 standard drinks of alcohol    Comment: special occasions   Drug use: No      Family History  Problem   Relation Age of Onset   Cancer Mother        uterine or ovarian. Unable to remember   Cancer Father        lung   Heart attack Sister     Allergies  Allergen Reactions   Amoxicillin Hives   Donepezil Diarrhea   Penicillins Hives     REVIEW OF SYSTEMS (Negative unless checked)  Constitutional: []Weight loss  []Fever  []Chills Cardiac: []Chest pain   []Chest pressure   []Palpitations   []Shortness of breath when laying flat   []Shortness of breath at rest   []Shortness of breath with exertion. Vascular:  []Pain in legs with walking   []Pain in legs at rest   []Pain in legs when laying flat   []Claudication   []Pain in feet when walking  []Pain in feet at rest  []Pain in feet when laying flat   []History of DVT   []Phlebitis   []Swelling in legs   []Varicose veins   []Non-healing ulcers Pulmonary:   []Uses home oxygen    []Productive cough   []Hemoptysis   []Wheeze  []COPD   []Asthma Neurologic:  []Dizziness  []Blackouts   []Seizures   [x]History of stroke   [x]History of TIA  []Aphasia   []Temporary blindness   []Dysphagia   []Weakness or numbness in arms   []Weakness or numbness in legs Musculoskeletal:  [x]Arthritis   []Joint swelling   []Joint pain   []Low back pain Hematologic:  []Easy bruising  []Easy bleeding   []Hypercoagulable state   []Anemic   Gastrointestinal:  []Blood in stool   []Vomiting blood  []Gastroesophageal reflux/heartburn   []Abdominal pain Genitourinary:  []Chronic kidney disease   []Difficult urination  []Frequent urination  []Burning with urination   []Hematuria Skin:  []Rashes   []Ulcers   []Wounds Psychological:  []History of anxiety   [x] History of major depression.  Physical Examination  BP (!) 146/82 (BP Location: Right Arm)   Pulse 72   Resp 16   Wt 194 lb 12.8 oz (88.4 kg)   BMI 30.51 kg/m  Gen:  WD/WN, NAD.  Appears younger than stated age Head: [place]/AT, No temporalis wasting. Ear/Nose/Throat: Hearing grossly intact, nares w/o erythema or drainage Eyes: Conjunctiva clear. Sclera non-icteric Neck: Supple.  Trachea midline Pulmonary:  Good air movement, no use of accessory muscles.  Cardiac: RRR, no JVD Vascular:  Vessel Right Left  Radial Palpable Palpable           Musculoskeletal: M/S 5/5 throughout.  No deformity or atrophy. No edema. Neurologic: Sensation grossly intact in extremities.  Symmetrical.  Speech is fluent.  Psychiatric: Judgment and insight appear fair Dermatologic: No rashes or ulcers noted.  No cellulitis or open wounds.      Labs Recent Results (from the past 2160 hour(s))  I-STAT creatinine     Status: Abnormal   Collection Time: 01/25/23  1:26 PM  Result Value Ref Range   Creatinine, Ser 1.50 (H) 0.44 - 1.00 mg/dL  HgB A1c     Status: Abnormal   Collection Time: 02/02/23 11:14 AM  Result Value Ref Range   Hgb A1c MFr Bld 6.0 (H) 4.8  - 5.6 %    Comment:          Prediabetes: 5.7 - 6.4          Diabetes: >6.4          Glycemic control for adults with diabetes: <7.0    Est. average glucose Bld gHb   Est-mCnc 126 mg/dL  Comprehensive Metabolic Panel (CMET)     Status: Abnormal   Collection Time: 02/02/23 11:14 AM  Result Value Ref Range   Glucose 101 (H) 70 - 99 mg/dL   BUN 16 8 - 27 mg/dL   Creatinine, Ser 1.26 (H) 0.57 - 1.00 mg/dL   eGFR 43 (L) >59 mL/min/1.73   BUN/Creatinine Ratio 13 12 - 28   Sodium 143 134 - 144 mmol/L   Potassium 4.4 3.5 - 5.2 mmol/L   Chloride 104 96 - 106 mmol/L   CO2 25 20 - 29 mmol/L   Calcium 8.7 8.7 - 10.3 mg/dL   Total Protein 6.5 6.0 - 8.5 g/dL   Albumin 3.8 3.8 - 4.8 g/dL   Globulin, Total 2.7 1.5 - 4.5 g/dL   Albumin/Globulin Ratio 1.4 1.2 - 2.2   Bilirubin Total 0.3 0.0 - 1.2 mg/dL   Alkaline Phosphatase 98 44 - 121 IU/L   AST 17 0 - 40 IU/L   ALT 8 0 - 32 IU/L    Radiology MR BRAIN WO CONTRAST  Result Date: 01/30/2023 CLINICAL DATA:  Mild cognitive impairment EXAM: MRI HEAD WITHOUT CONTRAST TECHNIQUE: Multiplanar, multiecho pulse sequences of the brain and surrounding structures were obtained without intravenous contrast. Additionally, using NeuroQuant software a 3D volumetric analysis of the brain was performed and is compared to a normative database adjusted for age, gender and intracranial volume. COMPARISON:  03/19/2020 FINDINGS: Brain: Diffusion imaging does not show any acute or subacute infarction. There are extensive chronic small-vessel ischemic changes of the pons with numerous foci of hemosiderin deposition. Numerous old small vessel cerebellar infarctions with hemosiderin deposition. Old small vessel infarctions of the thalami with hemosiderin deposition. Larger right basal ganglia and regional white matter tract infarction with hemosiderin deposition. Old right frontal operculum cortical and subcortical infarction. Chronic small-vessel ischemic changes throughout the  cerebral hemispheric white matter. Similar appearance when compared to the study of 03/19/2020. No obstructive hydrocephalus. No extra-axial fluid collection. No evidence of neoplastic mass lesion. Vascular: Major vessels at the base of the brain show flow. Skull and upper cervical spine: Benign appearing prominence of the inner table in the right temporal region. No significant finding. Sinuses/Orbits: Clear/normal Other: None NeuroQuant Findings: Volumetric analysis of the brain was performed, with a fully detailed report in [facility] PACS. Briefly, the comparison with age and gender matched reference reveals slight worsening of generalized volume loss. Whole brain volume now below the fifth percentile, previously slightly above the fifth percentile. All other structures appear similarly slightly more atrophic. IMPRESSION: No acute or reversible finding. Extensive small vessel ischemic changes throughout the brain, many with hemosiderin deposition, most consistent with chronic hypertensive disease. Larger focal infarction in the right basal ganglia and radiating white matter tract region. Old right frontal opercular cortical and subcortical infarction. NeuroQuant imaging shows a similar pattern to the study of 03/19/2020, with slight further age matched volume loss of all measured structures. Electronically Signed   By: Mark  Shogry M.D.   On: 01/30/2023 15:55   CT ANGIO NECK W OR WO CONTRAST  Result Date: 01/29/2023 CLINICAL DATA:  Carotid artery stenosis EXAM: CT ANGIOGRAPHY NECK TECHNIQUE: Multidetector CT imaging of the neck was performed using the standard protocol during bolus administration of intravenous contrast. Multiplanar CT image reconstructions and MIPs were obtained to evaluate the vascular anatomy. Carotid stenosis measurements (when applicable) are obtained utilizing NASCET criteria, using the distal internal carotid diameter as the denominator. RADIATION DOSE REDUCTION: This exam was performed    according to the departmental dose-optimization program which includes automated exposure control, adjustment of the mA and/or kV according to patient size and/or use of iterative reconstruction technique. CONTRAST:  60mL OMNIPAQUE IOHEXOL 350 MG/ML SOLN COMPARISON:  None Available. FINDINGS: Aortic arch: Standard branching. Imaged portion shows no evidence of aneurysm or dissection. Aortic atherosclerosis, which extends into the branch vessel origins, but does not cause hemodynamically significant stenosis. Right carotid system: Atherosclerotic plaque in the right common carotid artery, which causes up to 50% stenosis. Severe stenosis at the origin of the right ECA. Plaque at the bifurcation and in the proximal right ICA causes 60% stenosis (series 5, image 174). Atherosclerotic plaque in the distal right ICA near the skull base causes approximately 30% stenosis. Moderate stenosis in the imaged right cavernous and supraclinoid ICA. Left carotid system: Atherosclerotic plaque in the left common carotid artery causes up to 70% stenosis (series 5, image 243). No significant stenosis at the origin of the left ECA. Atherosclerotic plaque in the proximal left ICA does not cause hemodynamically significant stenosis. Moderate stenosis in the imaged cavernous and proximal supraclinoid ICA. Vertebral arteries: Moderate stenosis at the origin of the left vertebral artery with additional multifocal mild-to-moderate stenosis in the left V1 (series 5, image 317) and V2 (series 5, image 226). No significant stenosis in the V3. In the imaged left V4, the left vertebral artery primarily supplies the left PICA, which is patent. The remainder of the left V4 is poorly opacified and demonstrates multifocal moderate to severe stenosis but is patent to the vertebrobasilar junction. Mild stenosis at the origin of the right vertebral artery. Scattered atherosclerotic plaque but no significant stenosis in the right V1, V2, or V3. Mild  stenosis in the proximal right V4 (series 5, image 88). The right PICA is not well seen. The right vertebral artery is patent to vertebrobasilar junction. Skeleton: No acute osseous abnormality. Degenerative changes in the cervical spine. Other neck: Negative. Upper chest: No focal pulmonary opacity or pleural effusion. IMPRESSION: 1. 60% stenosis in the proximal right ICA. Severe stenosis at the origin of the right ECA and 50% stenosis in the left common carotid artery. 2. 70% stenosis in the left common carotid artery. No hemodynamically significant stenosis in the left ICA. 3. Moderate stenosis at the origin of the left vertebral artery with additional multifocal mild-to-moderate stenosis in the left V1 and V2. The left V4 demonstrates multifocal moderate to severe stenosis but is patent to the vertebrobasilar junction. 4. Mild stenosis at the origin of the right vertebral artery and mild stenosis in the proximal right V4. 5. Aortic atherosclerosis. Aortic Atherosclerosis (ICD10-I70.0). Electronically Signed   By: Alison  Vasan M.D.   On: 01/29/2023 02:34    Assessment/Plan  Carotid stenosis She has undergone a CT angiogram which I have independently reviewed.  The official report is of 50% right common carotid artery stenosis and 60% right internal carotid artery stenosis with a 70% left common carotid artery stenosis.  These are very calcific lesions and I would interpret these as significantly worse than the report.  The right is clearly closer to 70% and the left is 80% or greater.  The lesions are very long and calcific and extend largely into the common carotid arteries with this being the bulk of the disease. I had a long discussion today with the patient and her daughter who is very knowledgeable today.  Her disease is definitely significant and clearly on the left side would be above the threshold for revascularization.    Her anatomy is fairly poor for carotid stenting given the long bulky calcific  lesions.  This would be better served with an endarterectomy.  It would appear as if the right side is borderline or just below the degree that would benefit from endarterectomy but this also has very poor anatomy for stenting given the long bulky calcific plaque.  I discussed the risks and benefits of the procedure.  I discussed the option of medical management alone.  She does have previous neurologic events so this would be a symptomatic lesion.  The daughter is very frank and they are concerned about having the procedure done here at [facility] due to a previous poor experience with another family member.  I understand her concerns but I do not have privileges elsewhere so if they do not want to have the surgery here at [facility], they would have to seek another surgeon to perform the procedure.  I told her that was certainly a reasonable option.  They are going to go home and discuss as a family first whether or not they are going to have anything done, and second where they would like to have it done if they do.  I think that is very reasonable.  They will call our office with a decision.   Essential (primary) hypertension blood pressure control important in reducing the progression of atherosclerotic disease. On appropriate oral medications.   Diabetes mellitus without complication blood glucose control important in reducing the progression of atherosclerotic disease. Also, involved in wound healing. On appropriate medications.   Familial multiple lipoprotein-type hyperlipidemia lipid control important in reducing the progression of atherosclerotic disease. Continue statin therapy    Eudell Julian, MD  02/09/2023 2:56 PM    This note was created with Dragon medical transcription system.  Any errors from dictation are purely unintentional 

## 2023-02-09 NOTE — Assessment & Plan Note (Addendum)
She has undergone a CT angiogram which I have independently reviewed.  The official report is of 50% right common carotid artery stenosis and 60% right internal carotid artery stenosis with a 70% left common carotid artery stenosis.  These are very calcific lesions and I would interpret these as significantly worse than the report.  The right is clearly closer to 70% and the left is 80% or greater.  The lesions are very long and calcific and extend largely into the common carotid arteries with this being the bulk of the disease. I had a long discussion today with the patient and her daughter who is very knowledgeable today.  Her disease is definitely significant and clearly on the left side would be above the threshold for revascularization.  Her anatomy is fairly poor for carotid stenting given the long bulky calcific lesions.  This would be better served with an endarterectomy.  It would appear as if the right side is borderline or just below the degree that would benefit from endarterectomy but this also has very poor anatomy for stenting given the long bulky calcific plaque.  I discussed the risks and benefits of the procedure.  I discussed the option of medical management alone.  She does have previous neurologic events so this would be a symptomatic lesion.  The daughter is very frank and they are concerned about having the procedure done here at [facility] due to a previous poor experience with another family member.  I understand her concerns but I do not have privileges elsewhere so if they do not want to have the surgery here at [facility], they would have to seek another surgeon to perform the procedure.  I told her that was certainly a reasonable option.  They are going to go home and discuss as a family first whether or not they are going to have anything done, and second where they would like to have it done if they do.  I think that is very reasonable.  They will call our office with a decision.

## 2023-02-09 NOTE — Progress Notes (Signed)
MRN : [id number]  Karina Leonard is a 82 y.o. (1941/01/16) female who presents with chief complaint of  Chief Complaint  Patient presents with   Follow-up    Ct results  .  History of Present Illness: Patient returns today in follow up of her carotid disease. She has undergone a CT angiogram which I have independently reviewed.  The official report is of 50% right common carotid artery stenosis and 60% right internal carotid artery stenosis with a 70% left common carotid artery stenosis.  These are very calcific lesions and I would interpret these as significantly worse than the report.  The right is clearly closer to 70% and the left is 80% or greater.  The lesions are very long and calcific and extend largely into the common carotid arteries with this being the bulk of the disease.  The patient does have a previous history of stroke and TIA.  A recent MRI showed chronic small vessel ischemic changes as well of some small old infarcts.  She has had some brain fog and some confusion at times as well.  Memory issues are present as well.  Current Outpatient Medications  Medication Sig Dispense Refill   cloNIDine (CATAPRES - DOSED IN MG/24 HR) 0.1 mg/24hr patch Place 1 patch (0.1 mg total) onto the skin once a week. 12 patch 0   clopidogrel (PLAVIX) 75 MG tablet Take 1 tablet (75 mg total) by mouth daily. 90 tablet 1   cyanocobalamin (,VITAMIN B-12,) 1000 MCG/ML injection shah     fexofenadine (ALLEGRA) 180 MG tablet Take 1 tablet (180 mg total) by mouth daily. otc 90 tablet 1   fluticasone (FLONASE) 50 MCG/ACT nasal spray Place into both nostrils daily. otc     furosemide (LASIX) 20 MG tablet Take 20 mg by mouth daily. callwood     losartan-hydrochlorothiazide (HYZAAR) 100-25 MG tablet Take 1 tablet by mouth daily. 90 tablet 1   lovastatin (MEVACOR) 20 MG tablet TAKE 2 TABLETS EVERY DAY 180 tablet 1   metoprolol succinate (TOPROL-XL) 50 MG 24 hr tablet Take 1 tablet (50 mg total) by mouth  daily. [place] 90 tablet 0   venlafaxine XR (EFFEXOR-XR) 75 MG 24 hr capsule Take 1 capsule (75 mg total) by mouth daily. 90 capsule 1   No current facility-administered medications for this visit.    Past Medical History:  Diagnosis Date   Allergy    Cerebrovascular disease    Depression    Diabetes mellitus without complication    (borderline)   Hyperlipidemia    Hypertension    Sciatica    Stroke    Urinary incontinence    Wears dentures    full upper and lower    Past Surgical History:  Procedure Laterality Date   GALLBLADDER SURGERY  10/24/2007   polyp removed     rectum- benign     Social History   Tobacco Use   Smoking status: Former    Packs/day: 1.00    Years: 20.00    Additional pack years: 0.00    Total pack years: 20.00    Types: Cigarettes    Quit date: 65    Years since quitting: 57.3   Smokeless tobacco: Never  Vaping Use   Vaping Use: Never used  Substance Use Topics   Alcohol use: Yes    Alcohol/week: 0.0 standard drinks of alcohol    Comment: special occasions   Drug use: No      Family History  Problem  Relation Age of Onset   Cancer Mother        uterine or ovarian. Unable to remember   Cancer Father        lung   Heart attack Sister     Allergies  Allergen Reactions   Amoxicillin Hives   Donepezil Diarrhea   Penicillins Hives     REVIEW OF SYSTEMS (Negative unless checked)  Constitutional: Weight loss  Fever  Chills Cardiac: Chest pain   Chest pressure   Palpitations   Shortness of breath when laying flat   Shortness of breath at rest   Shortness of breath with exertion. Vascular:  Pain in legs with walking   Pain in legs at rest   Pain in legs when laying flat   Claudication   Pain in feet when walking  Pain in feet at rest  Pain in feet when laying flat   History of DVT   Phlebitis   Swelling in legs   Varicose veins   Non-healing ulcers Pulmonary:   Uses home oxygen    Productive cough   Hemoptysis   Wheeze  COPD   Asthma Neurologic:  Dizziness  Blackouts   Seizures   History of stroke   History of TIA  Aphasia   Temporary blindness   Dysphagia   Weakness or numbness in arms   Weakness or numbness in legs Musculoskeletal:  Arthritis   Joint swelling   Joint pain   Low back pain Hematologic:  Easy bruising  Easy bleeding   Hypercoagulable state   Anemic   Gastrointestinal:  Blood in stool   Vomiting blood  Gastroesophageal reflux/heartburn   Abdominal pain Genitourinary:  Chronic kidney disease   Difficult urination  Frequent urination  Burning with urination   Hematuria Skin:  Rashes   Ulcers   Wounds Psychological:  History of anxiety    History of major depression.  Physical Examination  BP (!) 146/82 (BP Location: Right Arm)   Pulse 72   Resp 16   Wt 194 lb 12.8 oz (88.4 kg)   BMI 30.51 kg/m  Gen:  WD/WN, NAD.  Appears younger than stated age Head: [place]/AT, No temporalis wasting. Ear/Nose/Throat: Hearing grossly intact, nares w/o erythema or drainage Eyes: Conjunctiva clear. Sclera non-icteric Neck: Supple.  Trachea midline Pulmonary:  Good air movement, no use of accessory muscles.  Cardiac: RRR, no JVD Vascular:  Vessel Right Left  Radial Palpable Palpable           Musculoskeletal: M/S 5/5 throughout.  No deformity or atrophy. No edema. Neurologic: Sensation grossly intact in extremities.  Symmetrical.  Speech is fluent.  Psychiatric: Judgment and insight appear fair Dermatologic: No rashes or ulcers noted.  No cellulitis or open wounds.      Labs Recent Results (from the past 2160 hour(s))  I-STAT creatinine     Status: Abnormal   Collection Time: 01/25/23  1:26 PM  Result Value Ref Range   Creatinine, Ser 1.50 (H) 0.44 - 1.00 mg/dL  HgB Y8M     Status: Abnormal   Collection Time: 02/02/23 11:14 AM  Result Value Ref Range   Hgb A1c MFr Bld 6.0 (H) 4.8  - 5.6 %    Comment:          Prediabetes: 5.7 - 6.4          Diabetes: >6.4          Glycemic control for adults with diabetes: <7.0    Est. average glucose Bld gHb  Est-mCnc 126 mg/dL  Comprehensive Metabolic Panel (CMET)     Status: Abnormal   Collection Time: 02/02/23 11:14 AM  Result Value Ref Range   Glucose 101 (H) 70 - 99 mg/dL   BUN 16 8 - 27 mg/dL   Creatinine, Ser 4.09 (H) 0.57 - 1.00 mg/dL   eGFR 43 (L) >81 XB/JYN/8.29   BUN/Creatinine Ratio 13 12 - 28   Sodium 143 134 - 144 mmol/L   Potassium 4.4 3.5 - 5.2 mmol/L   Chloride 104 96 - 106 mmol/L   CO2 25 20 - 29 mmol/L   Calcium 8.7 8.7 - 10.3 mg/dL   Total Protein 6.5 6.0 - 8.5 g/dL   Albumin 3.8 3.8 - 4.8 g/dL   Globulin, Total 2.7 1.5 - 4.5 g/dL   Albumin/Globulin Ratio 1.4 1.2 - 2.2   Bilirubin Total 0.3 0.0 - 1.2 mg/dL   Alkaline Phosphatase 98 44 - 121 IU/L   AST 17 0 - 40 IU/L   ALT 8 0 - 32 IU/L    Radiology MR BRAIN WO CONTRAST  Result Date: 01/30/2023 CLINICAL DATA:  Mild cognitive impairment EXAM: MRI HEAD WITHOUT CONTRAST TECHNIQUE: Multiplanar, multiecho pulse sequences of the brain and surrounding structures were obtained without intravenous contrast. Additionally, using NeuroQuant software a 3D volumetric analysis of the brain was performed and is compared to a normative database adjusted for age, gender and intracranial volume. COMPARISON:  03/19/2020 FINDINGS: Brain: Diffusion imaging does not show any acute or subacute infarction. There are extensive chronic small-vessel ischemic changes of the pons with numerous foci of hemosiderin deposition. Numerous old small vessel cerebellar infarctions with hemosiderin deposition. Old small vessel infarctions of the thalami with hemosiderin deposition. Larger right basal ganglia and regional white matter tract infarction with hemosiderin deposition. Old right frontal operculum cortical and subcortical infarction. Chronic small-vessel ischemic changes throughout the  cerebral hemispheric white matter. Similar appearance when compared to the study of 03/19/2020. No obstructive hydrocephalus. No extra-axial fluid collection. No evidence of neoplastic mass lesion. Vascular: Major vessels at the base of the brain show flow. Skull and upper cervical spine: Benign appearing prominence of the inner table in the right temporal region. No significant finding. Sinuses/Orbits: Clear/normal Other: None NeuroQuant Findings: Volumetric analysis of the brain was performed, with a fully detailed report in [employer]. Briefly, the comparison with age and gender matched reference reveals slight worsening of generalized volume loss. Whole brain volume now below the fifth percentile, previously slightly above the fifth percentile. All other structures appear similarly slightly more atrophic. IMPRESSION: No acute or reversible finding. Extensive small vessel ischemic changes throughout the brain, many with hemosiderin deposition, most consistent with chronic hypertensive disease. Larger focal infarction in the right basal ganglia and radiating white matter tract region. Old right frontal opercular cortical and subcortical infarction. NeuroQuant imaging shows a similar pattern to the study of 03/19/2020, with slight further age matched volume loss of all measured structures. Electronically Signed   By: Paulina Fusi M.D.   On: 01/30/2023 15:55   CT ANGIO NECK W OR WO CONTRAST  Result Date: 01/29/2023 CLINICAL DATA:  Carotid artery stenosis EXAM: CT ANGIOGRAPHY NECK TECHNIQUE: Multidetector CT imaging of the neck was performed using the standard protocol during bolus administration of intravenous contrast. Multiplanar CT image reconstructions and MIPs were obtained to evaluate the vascular anatomy. Carotid stenosis measurements (when applicable) are obtained utilizing NASCET criteria, using the distal internal carotid diameter as the denominator. RADIATION DOSE REDUCTION: This exam was performed  according to the departmental dose-optimization program which includes automated exposure control, adjustment of the mA and/or kV according to patient size and/or use of iterative reconstruction technique. CONTRAST:  60mL OMNIPAQUE IOHEXOL 350 MG/ML SOLN COMPARISON:  None Available. FINDINGS: Aortic arch: Standard branching. Imaged portion shows no evidence of aneurysm or dissection. Aortic atherosclerosis, which extends into the branch vessel origins, but does not cause hemodynamically significant stenosis. Right carotid system: Atherosclerotic plaque in the right common carotid artery, which causes up to 50% stenosis. Severe stenosis at the origin of the right ECA. Plaque at the bifurcation and in the proximal right ICA causes 60% stenosis (series 5, image 174). Atherosclerotic plaque in the distal right ICA near the skull base causes approximately 30% stenosis. Moderate stenosis in the imaged right cavernous and supraclinoid ICA. Left carotid system: Atherosclerotic plaque in the left common carotid artery causes up to 70% stenosis (series 5, image 243). No significant stenosis at the origin of the left ECA. Atherosclerotic plaque in the proximal left ICA does not cause hemodynamically significant stenosis. Moderate stenosis in the imaged cavernous and proximal supraclinoid ICA. Vertebral arteries: Moderate stenosis at the origin of the left vertebral artery with additional multifocal mild-to-moderate stenosis in the left V1 (series 5, image 317) and V2 (series 5, image 226). No significant stenosis in the V3. In the imaged left V4, the left vertebral artery primarily supplies the left PICA, which is patent. The remainder of the left V4 is poorly opacified and demonstrates multifocal moderate to severe stenosis but is patent to the vertebrobasilar junction. Mild stenosis at the origin of the right vertebral artery. Scattered atherosclerotic plaque but no significant stenosis in the right V1, V2, or V3. Mild  stenosis in the proximal right V4 (series 5, image 88). The right PICA is not well seen. The right vertebral artery is patent to vertebrobasilar junction. Skeleton: No acute osseous abnormality. Degenerative changes in the cervical spine. Other neck: Negative. Upper chest: No focal pulmonary opacity or pleural effusion. IMPRESSION: 1. 60% stenosis in the proximal right ICA. Severe stenosis at the origin of the right ECA and 50% stenosis in the left common carotid artery. 2. 70% stenosis in the left common carotid artery. No hemodynamically significant stenosis in the left ICA. 3. Moderate stenosis at the origin of the left vertebral artery with additional multifocal mild-to-moderate stenosis in the left V1 and V2. The left V4 demonstrates multifocal moderate to severe stenosis but is patent to the vertebrobasilar junction. 4. Mild stenosis at the origin of the right vertebral artery and mild stenosis in the proximal right V4. 5. Aortic atherosclerosis. Aortic Atherosclerosis (ICD10-I70.0). Electronically Signed   By: Wiliam Ke M.D.   On: 01/29/2023 02:34    Assessment/Plan  Carotid stenosis She has undergone a CT angiogram which I have independently reviewed.  The official report is of 50% right common carotid artery stenosis and 60% right internal carotid artery stenosis with a 70% left common carotid artery stenosis.  These are very calcific lesions and I would interpret these as significantly worse than the report.  The right is clearly closer to 70% and the left is 80% or greater.  The lesions are very long and calcific and extend largely into the common carotid arteries with this being the bulk of the disease. I had a long discussion today with the patient and her daughter who is very knowledgeable today.  Her disease is definitely significant and clearly on the left side would be above the threshold for revascularization.  Her anatomy is fairly poor for carotid stenting given the long bulky calcific  lesions.  This would be better served with an endarterectomy.  It would appear as if the right side is borderline or just below the degree that would benefit from endarterectomy but this also has very poor anatomy for stenting given the long bulky calcific plaque.  I discussed the risks and benefits of the procedure.  I discussed the option of medical management alone.  She does have previous neurologic events so this would be a symptomatic lesion.  The daughter is very frank and they are concerned about having the procedure done here at [facility] due to a previous poor experience with another family member.  I understand her concerns but I do not have privileges elsewhere so if they do not want to have the surgery here at [facility], they would have to seek another surgeon to perform the procedure.  I told her that was certainly a reasonable option.  They are going to go home and discuss as a family first whether or not they are going to have anything done, and second where they would like to have it done if they do.  I think that is very reasonable.  They will call our office with a decision.   Essential (primary) hypertension blood pressure control important in reducing the progression of atherosclerotic disease. On appropriate oral medications.   Diabetes mellitus without complication blood glucose control important in reducing the progression of atherosclerotic disease. Also, involved in wound healing. On appropriate medications.   Familial multiple lipoprotein-type hyperlipidemia lipid control important in reducing the progression of atherosclerotic disease. Continue statin therapy    Festus Barren, MD  02/09/2023 2:56 PM    This note was created with Dragon medical transcription system.  Any errors from dictation are purely unintentional

## 2023-02-09 NOTE — Assessment & Plan Note (Signed)
blood pressure control important in reducing the progression of atherosclerotic disease. On appropriate oral medications.  

## 2023-02-09 NOTE — Assessment & Plan Note (Signed)
lipid control important in reducing the progression of atherosclerotic disease. Continue statin therapy  

## 2023-02-12 DIAGNOSIS — Z01818 Encounter for other preprocedural examination: Secondary | ICD-10-CM | POA: Diagnosis not present

## 2023-02-12 DIAGNOSIS — R001 Bradycardia, unspecified: Secondary | ICD-10-CM | POA: Diagnosis not present

## 2023-02-12 DIAGNOSIS — I2510 Atherosclerotic heart disease of native coronary artery without angina pectoris: Secondary | ICD-10-CM | POA: Diagnosis not present

## 2023-02-12 DIAGNOSIS — I6523 Occlusion and stenosis of bilateral carotid arteries: Secondary | ICD-10-CM | POA: Diagnosis not present

## 2023-02-13 DIAGNOSIS — M6281 Muscle weakness (generalized): Secondary | ICD-10-CM | POA: Diagnosis not present

## 2023-02-13 DIAGNOSIS — R262 Difficulty in walking, not elsewhere classified: Secondary | ICD-10-CM | POA: Diagnosis not present

## 2023-02-13 DIAGNOSIS — M545 Low back pain, unspecified: Secondary | ICD-10-CM | POA: Diagnosis not present

## 2023-02-15 DIAGNOSIS — M545 Low back pain, unspecified: Secondary | ICD-10-CM | POA: Diagnosis not present

## 2023-02-15 DIAGNOSIS — R262 Difficulty in walking, not elsewhere classified: Secondary | ICD-10-CM | POA: Diagnosis not present

## 2023-02-15 DIAGNOSIS — M6281 Muscle weakness (generalized): Secondary | ICD-10-CM | POA: Diagnosis not present

## 2023-02-20 DIAGNOSIS — R262 Difficulty in walking, not elsewhere classified: Secondary | ICD-10-CM | POA: Diagnosis not present

## 2023-02-20 DIAGNOSIS — M6281 Muscle weakness (generalized): Secondary | ICD-10-CM | POA: Diagnosis not present

## 2023-02-20 DIAGNOSIS — M545 Low back pain, unspecified: Secondary | ICD-10-CM | POA: Diagnosis not present

## 2023-02-22 DIAGNOSIS — M545 Low back pain, unspecified: Secondary | ICD-10-CM | POA: Diagnosis not present

## 2023-02-22 DIAGNOSIS — R262 Difficulty in walking, not elsewhere classified: Secondary | ICD-10-CM | POA: Diagnosis not present

## 2023-02-22 DIAGNOSIS — M6281 Muscle weakness (generalized): Secondary | ICD-10-CM | POA: Diagnosis not present

## 2023-02-23 ENCOUNTER — Telehealth (INDEPENDENT_AMBULATORY_CARE_PROVIDER_SITE_OTHER): Payer: Self-pay

## 2023-02-23 NOTE — Telephone Encounter (Unsigned)
I spoke with the patient's daughter Karina Leonard regarding her surgery. The patient is scheduled with Dr. Wyn Quaker on 03/01/23 for a LCEA at the [facility]. Pre-op is on 02/27/23 at 11:00 am. Pre-surgical instructions were discussed and will be mailed.  Patient's daughter called back to have the patient moved to another day due to getting it worked out for patient to have someone with her. Patient has been moved to 03/08/23 for her surgery and her pre-op remains the same. Pre-surgical instructions will be mailed.

## 2023-02-27 ENCOUNTER — Encounter
Admission: RE | Admit: 2023-02-27 | Discharge: 2023-02-27 | Disposition: A | Discharge status: Home / Self Care | Payer: Medicare Other | Source: Ambulatory Visit | Attending: Vascular Surgery | Admitting: Vascular Surgery

## 2023-02-27 ENCOUNTER — Other Ambulatory Visit (INDEPENDENT_AMBULATORY_CARE_PROVIDER_SITE_OTHER): Payer: Self-pay | Admitting: Nurse Practitioner

## 2023-02-27 DIAGNOSIS — Z01812 Encounter for preprocedural laboratory examination: Secondary | ICD-10-CM | POA: Insufficient documentation

## 2023-02-27 DIAGNOSIS — D128 Benign neoplasm of rectum: Secondary | ICD-10-CM

## 2023-02-27 DIAGNOSIS — I371 Nonrheumatic pulmonary valve insufficiency: Secondary | ICD-10-CM | POA: Diagnosis not present

## 2023-02-27 DIAGNOSIS — I517 Cardiomegaly: Secondary | ICD-10-CM | POA: Insufficient documentation

## 2023-02-27 DIAGNOSIS — I4891 Unspecified atrial fibrillation: Secondary | ICD-10-CM | POA: Diagnosis not present

## 2023-02-27 DIAGNOSIS — M545 Low back pain, unspecified: Secondary | ICD-10-CM | POA: Diagnosis not present

## 2023-02-27 DIAGNOSIS — I6523 Occlusion and stenosis of bilateral carotid arteries: Secondary | ICD-10-CM | POA: Diagnosis not present

## 2023-02-27 DIAGNOSIS — I081 Rheumatic disorders of both mitral and tricuspid valves: Secondary | ICD-10-CM | POA: Insufficient documentation

## 2023-02-27 DIAGNOSIS — R609 Edema, unspecified: Secondary | ICD-10-CM | POA: Diagnosis not present

## 2023-02-27 DIAGNOSIS — R262 Difficulty in walking, not elsewhere classified: Secondary | ICD-10-CM | POA: Diagnosis not present

## 2023-02-27 DIAGNOSIS — M6281 Muscle weakness (generalized): Secondary | ICD-10-CM | POA: Diagnosis not present

## 2023-02-27 HISTORY — DX: Occlusion and stenosis of bilateral carotid arteries: I65.23

## 2023-02-27 HISTORY — DX: Bradycardia, unspecified: R00.1

## 2023-02-27 HISTORY — DX: Unspecified atrial fibrillation: I48.91

## 2023-02-27 HISTORY — DX: Complete loss of teeth, unspecified cause, unspecified class: K08.109

## 2023-02-27 HISTORY — DX: Cortical age-related cataract, unspecified eye: H25.019

## 2023-02-27 HISTORY — DX: Complete loss of teeth, unspecified cause, unspecified class: Z97.2

## 2023-02-27 HISTORY — DX: Transient cerebral ischemic attack, unspecified: G45.9

## 2023-02-27 HISTORY — DX: Atherosclerotic heart disease of native coronary artery without angina pectoris: I25.10

## 2023-02-27 HISTORY — DX: Prediabetes: R73.03

## 2023-02-27 LAB — CBC WITH DIFFERENTIAL/PLATELET
Abs Immature Granulocytes: 0.05 10*3/uL (ref 0.00–0.07)
Basophils Absolute: 0.1 10*3/uL (ref 0.0–0.1)
Basophils Relative: 1 %
Eosinophils Absolute: 0.1 10*3/uL (ref 0.0–0.5)
Eosinophils Relative: 1 %
HCT: 36.3 % (ref 36.0–46.0)
Hemoglobin: 11.9 g/dL — ABNORMAL LOW (ref 12.0–15.0)
Immature Granulocytes: 0 %
Lymphocytes Relative: 16 %
Lymphs Abs: 1.9 10*3/uL (ref 0.7–4.0)
MCH: 30.4 pg (ref 26.0–34.0)
MCHC: 32.8 g/dL (ref 30.0–36.0)
MCV: 92.6 fL (ref 80.0–100.0)
Monocytes Absolute: 0.4 10*3/uL (ref 0.1–1.0)
Monocytes Relative: 4 %
Neutro Abs: 9.4 10*3/uL — ABNORMAL HIGH (ref 1.7–7.7)
Neutrophils Relative %: 78 %
Platelets: 218 10*3/uL (ref 150–400)
RBC: 3.92 MIL/uL (ref 3.87–5.11)
RDW: 13.3 % (ref 11.5–15.5)
WBC: 11.9 10*3/uL — ABNORMAL HIGH (ref 4.0–10.5)
nRBC: 0 % (ref 0.0–0.2)

## 2023-02-27 LAB — BASIC METABOLIC PANEL
Anion gap: 9 (ref 5–15)
BUN: 25 mg/dL — ABNORMAL HIGH (ref 8–23)
CO2: 26 mmol/L (ref 22–32)
Calcium: 8.3 mg/dL — ABNORMAL LOW (ref 8.9–10.3)
Chloride: 105 mmol/L (ref 98–111)
Creatinine, Ser: 1.17 mg/dL — ABNORMAL HIGH (ref 0.44–1.00)
GFR, Estimated: 47 mL/min — ABNORMAL LOW (ref >60)
Glucose, Bld: 109 mg/dL — ABNORMAL HIGH (ref 70–99)
Potassium: 4.2 mmol/L (ref 3.5–5.1)
Sodium: 140 mmol/L (ref 135–145)

## 2023-02-27 LAB — TYPE AND SCREEN
ABO/RH(D): A POS
Antibody Screen: NEGATIVE

## 2023-02-27 LAB — SURGICAL PCR SCREEN
MRSA, PCR: NEGATIVE
Staphylococcus aureus: NEGATIVE

## 2023-02-27 NOTE — Patient Instructions (Addendum)
Your procedure is scheduled on: Thursday, May 16 Report to the Registration Desk on the 1st floor of the [facility] To find out your arrival time, please call [phone] between 1PM - 3PM on: Wednesday, May 15 If your arrival time is 6:00 am, do not arrive before that time as the [facility] entrance doors do not open until 6:00 am.  REMEMBER: Instructions that are not followed completely may result in serious medical risk, up to and including death; or upon the discretion of your surgeon and anesthesiologist your surgery may need to be rescheduled.  Do not eat or drink after midnight the night before surgery.  No gum chewing or hard candies.  One week prior to surgery: starting May 9 Stop Anti-inflammatories (NSAIDS) such as Advil, Aleve, Ibuprofen, Motrin, Naproxen, Naprosyn and Aspirin based products such as Excedrin, Goody's Powder, BC Powder. Stop ANY OVER THE COUNTER supplements until after surgery. You may however, continue to take Tylenol if needed for pain up until the day of surgery.  Continue taking all prescribed medications with the exception of the following:  Per Sheppard Plumber, NP; hold clopidogrel (Plavix) for 5 days before surgery. Last day to take is Friday, May 10. Start taking aspirin 81 mg on Saturday, May 11 and continue taking the aspirin until the day of surgery.  TAKE ONLY THESE MEDICATIONS THE MORNING OF SURGERY WITH A SIP OF WATER:  Metoprolol  No Alcohol for 24 hours before or after surgery.  No Smoking including e-cigarettes for 24 hours before surgery.  No chewable tobacco products for at least 6 hours before surgery.  No nicotine patches on the day of surgery.  On the morning of surgery brush your teeth with toothpaste and water, you may rinse your mouth with mouthwash if you wish. Do not swallow any toothpaste or mouthwash.  Use CHG Soap as directed on instruction sheet.  Do not wear jewelry, make-up, hairpins, clips or nail polish.  Do not  wear lotions, powders, or perfumes.   Do not shave body hair from the neck down 48 hours before surgery.  Contact lenses, hearing aids and dentures may not be worn into surgery.  Do not bring valuables to the hospital. [facility] is not responsible for any missing/lost belongings or valuables.   Notify your doctor if there is any change in your medical condition (cold, fever, infection).  Wear comfortable clothing (specific to your surgery type) to the hospital.  After surgery, you can help prevent lung complications by doing breathing exercises.  Take deep breaths and cough every 1-2 hours. Your doctor may order a device called an Incentive Spirometer to help you take deep breaths.  If you are being admitted to the hospital overnight, leave your suitcase in the car. After surgery it may be brought to your room.  If you are being discharged the day of surgery, you will not be allowed to drive home. You will need a responsible individual to drive you home and stay with you for 24 hours after surgery.   If you are taking public transportation, you will need to have a responsible individual with you.  Please call the Pre-admissions Testing Dept. at [phone] if you have any questions about these instructions.  Surgery Visitation Policy:  Patients having surgery or a procedure may have two visitors.  Children under the age of 40 must have an adult with them who is not the patient.  Inpatient Visitation:    Visiting hours are 7 a.m. to  8 p.m. Up to four visitors are allowed at one time in a patient room. The visitors may rotate out with other people during the day.  One visitor age 32 or older may stay with the patient overnight and must be in the room by 8 p.m.     Preparing for Surgery with CHLORHEXIDINE GLUCONATE (CHG) Soap  Chlorhexidine Gluconate (CHG) Soap  o An antiseptic cleaner that kills germs and bonds with the skin to continue killing germs even after  washing  o Used for showering the night before surgery and morning of surgery  Before surgery, you can play an important role by reducing the number of germs on your skin.  CHG (Chlorhexidine gluconate) soap is an antiseptic cleanser which kills germs and bonds with the skin to continue killing germs even after washing.  Please do not use if you have an allergy to CHG or antibacterial soaps. If your skin becomes reddened/irritated stop using the CHG.  1. Shower the NIGHT BEFORE SURGERY and the MORNING OF SURGERY with CHG soap.  2. If you choose to wash your hair, wash your hair first as usual with your normal shampoo.  3. After shampooing, rinse your hair and body thoroughly to remove the shampoo.  4. Use CHG as you would any other liquid soap. You can apply CHG directly to the skin and wash gently with a scrungie or a clean washcloth.  5. Apply the CHG soap to your body only from the neck down. Do not use on open wounds or open sores. Avoid contact with your eyes, ears, mouth, and genitals (private parts). Wash face and genitals (private parts) with your normal soap.  6. Wash thoroughly, paying special attention to the area where your surgery will be performed.  7. Thoroughly rinse your body with warm water.  8. Do not shower/wash with your normal soap after using and rinsing off the CHG soap.  9. Pat yourself dry with a clean towel.  10. Wear clean pajamas to bed the night before surgery.  12. Place clean sheets on your bed the night of your first shower and do not sleep with pets.  13. Shower again with the CHG soap on the day of surgery prior to arriving at the hospital.  14. Do not apply any deodorants/lotions/powders.  15. Please wear clean clothes to the hospital.

## 2023-02-28 NOTE — Progress Notes (Addendum)
Perioperative Services Pre-Admission/Anesthesia Testing    Date: 02/27/2023  Name: WILHELMENA MARZULLO MRN:   [id number]  Re: Abnormal EKG -- new onset atrial fibrillation  Planned Surgical Procedure(s):    Case: [id number] Date/Time: 03/08/23 0715   Procedure: ENDARTERECTOMY CAROTID (Left)   Anesthesia type: General   Pre-op diagnosis: CAROTID ARTERY STENOSIS   Location: [facility] OR ROOM 08 / [facility]   Surgeons: Annice Needy, MD      Clinical Notes:  Patient is scheduled for the above procedure on 03/08/2023 with Dr. Festus Barren, MD. in preparation for her procedure, patient presented to the PAT clinic today (02/27/2023) for preoperative labs and ECG.  In review of her preoperative ECG, patient appears to be in new onset atrial fibrillation with a slow ventricular response (52 bpm).  Patient denies any previous history of atrial fibrillation.; denies previous history of atrial fibrillation. LVH with repolarization abnormality noted.  Additionally, patient with inferolateral T wave changes in leads I, II, aVL, aVF, and V4-V6.   Preoperative ECG and compared to previous tracing obtained on 12/20/2022 that showed sinus bradycardia with a sinus arrhythmia and RBBB at a rate of 52 bpm.  Voltage criteria for LVH present.  ST and T wave abnormalities present at that time.  Since patient was seen by cardiology, she has undergone further cardiovascular testing (TTE and MPI).  Reviewed most recent cardiology note dated 02/12/2023.  At the time of her visit, patient reporting feeling sluggish.  She was also experiencing chronic peripheral edema.  She denied any significant chest pain, shortness breath, PND, orthopnea, palpitations, or presyncope/syncope.  Preoperative ECG:    Pertinent Cardiovascular Testing:  MYOCARDIAL PERFUSION IMAGING STUDY (LEXISCAN) performed on 01/08/2023 Mildly reduced left ventricular systolic function with an EF of 45% Hypokinesis of the anterior and  apical walls No artifact noted Left ventricular cavity size borderline enlarged Moderate fixed perfusion abnormality of moderate intensity present in the anterior/apical region on stress images Findings concerning for infarct versus ischemia Moderate to high risk scan.  Recommend considering further invasive evaluation via cardiac catheterization.  TRANSTHORACIC ECHOCARDIOGRAM performed on 01/08/2023 Normal left ventricular systolic function with an EF of >55%.   There was moderate concentric LVH.  Left ventricular diastolic Doppler parameters consistent with abnormal relaxation (G1DD).   GLS -16.7%  Left atrium severely enlarged.   There was mild mitral, tricuspid, and pulmonary valve regurgitation.   All transvalvular gradients were noted to be normal provide no evidence of valvular stenosis.  Impression and Plan:  REBEKAH JOERG presented for preoperative testing prior to upcoming LEFT carotid endarterectomy.  Patient previously cleared by cardiology, however preoperative ECG demonstrated presumably new onset atrial fibrillation. Copy of ECG and note was sent to cardiology Juliann Pares, MD) for review.  Patient has had recent noninvasive cardiovascular workup.  Following most recent abnormal myocardial perfusion imaging study (moderate to high risk), further invasive workup via cardiac catheterization was recommended.    At this point, we have followed up with Dr. Glennis Brink office to discuss the aforementioned.  MD confirms new onset atrial fibrillation.  In light of nature of upcoming procedure, Dr. Juliann Pares okay with patient proceeding as planned.  Patient will need to follow-up with cardiology in 1 to 2 weeks following her endarterectomy to discuss being placed on chronic anticoagulation therapy.  Copy of this note being sent to primary attending surgeon and his APP to make them aware of new clinical finding and plans for cardiology follow-up.  Quentin Mulling, MSN, APRN,  FNP-C, CEN [facility]  Peri-operative Services Nurse Practitioner Phone: [phone]  NOTE: This note has been prepared using Dragon dictation software. Despite my best ability to proofread, there is always the potential that unintentional transcriptional errors may still occur from this process.

## 2023-03-01 DIAGNOSIS — M6281 Muscle weakness (generalized): Secondary | ICD-10-CM | POA: Diagnosis not present

## 2023-03-01 DIAGNOSIS — M545 Low back pain, unspecified: Secondary | ICD-10-CM | POA: Diagnosis not present

## 2023-03-01 DIAGNOSIS — R262 Difficulty in walking, not elsewhere classified: Secondary | ICD-10-CM | POA: Diagnosis not present

## 2023-03-05 DIAGNOSIS — H3581 Retinal edema: Secondary | ICD-10-CM | POA: Diagnosis not present

## 2023-03-05 DIAGNOSIS — H35033 Hypertensive retinopathy, bilateral: Secondary | ICD-10-CM | POA: Diagnosis not present

## 2023-03-07 ENCOUNTER — Encounter: Payer: Self-pay | Admitting: Vascular Surgery

## 2023-03-07 NOTE — Progress Notes (Signed)
Perioperative / Anesthesia Services  Pre-Admission Testing Clinical Review / Preoperative Anesthesia Consult  Date: 03/07/23  Patient Demographics:  Name: Karina Leonard DOB:   1941/01/13 MRN:   [id number]  Planned Surgical Procedure(s):    Case: [id number] Date/Time: 03/08/23 0715   Procedure: ENDARTERECTOMY CAROTID (Left)   Anesthesia type: General   Pre-op diagnosis: CAROTID ARTERY STENOSIS   Location: [facility] OR ROOM 08 / [facility]   Surgeons: Annice Needy, MD     NOTE: Available PAT nursing documentation and vital signs have been reviewed. Clinical nursing staff has updated patient's PMH/PSHx, current medication list, and drug allergies/intolerances to ensure comprehensive history available to assist in medical decision making as it pertains to the aforementioned surgical procedure and anticipated anesthetic course. Extensive review of available clinical information personally performed. [name] PMH and PSHx updated with any diagnoses/procedures that  may have been inadvertently omitted during her intake with the pre-admission testing department's nursing staff.  Clinical Discussion:  Karina Leonard is a 82 y.o. female who is submitted for pre-surgical anesthesia review and clearance prior to her undergoing the above procedure. Patient is a Former Smoker (20 pack years; quit 10/1965). Pertinent PMH includes: CAD, atrial fibrillation, diastolic dysfunction, bradycardia, CVA, TIA, HTN, HLD, prediabetes, lumbar DDD, depression, mild cognitive impairment.  Patient is followed by cardiology Juliann Pares, MD). She was last seen in the cardiology clinic on 02/12/2023; notes reviewed. At the time of her clinic visit, patient doing well overall from a cardiovascular perspective.  Patient reported being nervous regarding upcoming surgery.  She reported feeling "sluggish" most of the time citing that her energy was not what it used to be.  Patient has intermittent peripheral  edema, however does not take her prescribed diuretic medication on a routine basis. Patient denied any chest pain, shortness of breath, PND, orthopnea, palpitations, weakness, fatigue, vertiginous symptoms, or presyncope/syncope. Patient with a past medical history significant for cardiovascular diagnoses. Documented physical exam was grossly benign, providing no evidence of acute exacerbation and/or decompensation of the patient's known cardiovascular conditions.  MRI imaging of the brain performed on 03/19/2020 revealed evidence of multiple old small vessel infarcts of the basal ganglia, thalamus, and pons.  Patient also has a history of recurrent TIAs.  Other than some mild cognitive impairment, patient has no significant neurological deficits resulting from these events.  CT angiography study of the neck performed on 01/25/2023 revealed a 60% stenosis of the proximal RICA with contralateral 70% stenosis of the LICA.   Most recent TTE was performed on 01/08/2023 revealing a normal left ventricular systolic function with an EF of >55%.  There was moderate concentric LVH. Left ventricular diastolic Doppler parameters consistent with abnormal relaxation (G1DD).  Left atrium severely enlarged.  There was mild mitral, tricuspid, and pulmonary valve regurgitation.  There was no evidence of any significant transvalvular gradients to suggest valvular stenosis.  Most recent myocardial perfusion imaging study performed on 01/08/2023 revealed a mildly reduced left ventricular systolic function with an EF of 45%.  There was hypokinesis of the anterior/apical walls.  SPECT images demonstrated a moderate mixed perfusion abnormality in the anterior/apical region on stress images.  Findings concerning for infarct versus ischemia.  Study determined to be moderate to high risk.  Recommended further evaluation invasively via diagnostic LEFT heart catheterization if symptoms persist or worsen.  Blood pressure mildly  elevated at 148/80 mmHg on currently prescribed alpha blocker (clonidine), diuretic (furosemide + HCTZ), ARB (losartan) and beta-blocker (metoprolol succinate) therapies.  Patient is on lovastatin for her HLD diagnosis and ASCVD prevention.  Patient has a prediabetes diagnosis; last HgbA1c was 6.0% when checked on 02/02/2023.  She does not have an OSAH diagnosis.  Functional capacity limited by age, cognitive impairment, and multiple medical comorbidities.  With that said, patient still felt to be able to achieve at least 4 METS of physical activity without experiencing any significant degree of angina/anginal equivalent symptoms.  Given her mildly elevated blood pressure, clonidine dose was increased to 0.2 mg patch weekly.  No other changes were made to her medication regimen.  Patient to follow-up with outpatient cardiology in 6 months or sooner if needed.  Karina Leonard is scheduled for an ENDARTERECTOMY CAROTID (Left) on 03/08/2023 with Dr. Festus Barren, MD.  Given patient's past medical history significant for cardiovascular diagnoses, presurgical cardiac clearance was sought by the PAT team.  Of note, during her PAT appointment, ECG revealed presumably acute onset atrial fibrillation.  This was confirmed with her cardiologist.  Patient to follow-up with her cardiologist 1-2 weeks postoperatively to discuss initiation of DOAC therapy.  Per cardiology, "this patient is optimized for surgery and may proceed with the planned procedural course with a LOW risk of significant perioperative cardiovascular complications".  Again, this patient is on daily DAPT therapy.  She has been instructed on recommendations from her cardiologist and vascular surgeon for holding her clopidogrel for 5 days prior to her procedure with plans to restart since postoperatively respectively minimized by her primary attending surgeon.  The patient is aware that her last dose of clopidogrel should be on 03/02/2023.  Given her  cardiovascular history, patient will continue her daily low-dose ASA throughout her perioperative course.   Patient denies previous perioperative complications with anesthesia in the past. In review of the available records, it is noted that patient underwent a general anesthetic course here at [facility] (ASA III) was in 11/2014 without documented complications.      02/27/2023   12:13 PM 02/09/2023   10:21 AM 02/02/2023   10:10 AM  Vitals with BMI  Height 5\' 7"   5\' 7"   Weight 193 lbs 194 lbs 13 oz 191 lbs  BMI 30.22 30.5 29.91  Systolic 150 146 161  Diastolic 79 82 78  Pulse 72 72 48    Providers/Specialists:   NOTE: Primary physician provider listed below. Patient may have been seen by APP or partner within same practice.   PROVIDER ROLE / SPECIALTY LAST Karina Cisco, MD Vascular Surgery (Surgeon) 02/09/2023  Karina Limerick, MD Primary Care Provider 02/02/2023  Karina Hitt, MD Cardiology 02/12/2023  Karina Peru, MD Neurology 01/11/2023   Allergies:  Amoxicillin, Donepezil, and Penicillins  Current Home Medications:   No current facility-administered medications for this encounter.    aspirin EC 81 MG tablet   cloNIDine (CATAPRES - DOSED IN MG/24 HR) 0.2 mg/24hr patch   clopidogrel (PLAVIX) 75 MG tablet   fexofenadine (ALLEGRA) 180 MG tablet   fluticasone (FLONASE) 50 MCG/ACT nasal spray   furosemide (LASIX) 20 MG tablet   losartan-hydrochlorothiazide (HYZAAR) 100-25 MG tablet   lovastatin (MEVACOR) 20 MG tablet   metoprolol succinate (TOPROL-XL) 50 MG 24 hr tablet   venlafaxine XR (EFFEXOR-XR) 75 MG 24 hr capsule   History:   Past Medical History:  Diagnosis Date   Allergy    Aortic atherosclerosis (HCC)    Atrial fibrillation with slow ventricular response (HCC) 02/27/2023   a.) noted on preoperative ECG  02/27/2023; A.fib at 52 bpm with LVH and (+) inferolateral TWIs; b.) CHA2DS2VASc = 7 (age x 2, sex, HTN, CVA/TIA x2,  vascular disease history); c.) rate/rhythm maintained on oral metoprolol; chronically antithrombotic therapy (ASA + clopidogrel)   Bilateral carotid artery disease (HCC)    a.) doppler 01/25/2023: 60% RICA, 70% LICA   Bradycardia    Cataract cortical, senile    Cerebrovascular disease    Coronary artery disease involving native coronary artery of native heart without angina pectoris    a.) MPI 05/24/2020: EF 45-50%. Borderline ant/lat defect with border zone redistribution; b.) MPI 01/08/2023: EF 45%; mild-mod mixed ant apical defect c/w infarct vs. ischemia   DDD (degenerative disc disease), lumbar    Depression    Diastolic dysfunction    a.) TTE 01/08/2023: EF >55%, mod LVH, sev LAE, mild MR/TR/PR, G1DD   Full dentures    Hyperlipidemia    Hypertension    Keloid    Lipoma    Long term current use of antithrombotics/antiplatelets    a.) on DAPT (ASA + clopidogrel)   Mild cognitive impairment    Pre-diabetes    Sciatica    Stroke [facility])    a.) MRI brain 03/19/2020: multiple old small vessel infarcts of the basal ganglia, thalamus and pons   TIA (transient ischemic attack)    Urinary incontinence    Past Surgical History:  Procedure Laterality Date   CHOLECYSTECTOMY  2009   COLONOSCOPY     RECTAL POLYPECTOMY     benign   Family History  Problem Relation Age of Onset   Cancer Mother        uterine or ovarian. Unable to remember   Cancer Father        lung   Heart attack Sister    Social History   Tobacco Use   Smoking status: Former    Packs/day: 1.00    Years: 20.00    Additional pack years: 0.00    Total pack years: 20.00    Types: Cigarettes    Quit date: 63    Years since quitting: 57.4   Smokeless tobacco: Never  Vaping Use   Vaping Use: Never used  Substance Use Topics   Alcohol use: Yes    Alcohol/week: 0.0 standard drinks of alcohol    Comment: special occasions   Drug use: No    Pertinent Clinical Results:  LABS:   No visits with results  within 3 Day(s) from this visit.  Latest known visit with results is:  Hospital Outpatient Visit on 02/27/2023  Component Date Value Ref Range Status   ABO/RH(D) 02/27/2023 A POS   Final   Antibody Screen 02/27/2023 NEG   Final   Sample Expiration 02/27/2023 03/13/2023,2359   Final   Extend sample reason 02/27/2023    Final                   Value:NO TRANSFUSIONS OR PREGNANCY IN THE PAST 3 MONTHS Performed at [facility], [address] [place], [place] [address]    Sodium 02/27/2023 140  135 - 145 mmol/L Final   Potassium 02/27/2023 4.2  3.5 - 5.1 mmol/L Final   Chloride 02/27/2023 105  98 - 111 mmol/L Final   CO2 02/27/2023 26  22 - 32 mmol/L Final   Glucose, Bld 02/27/2023 109 (H)  70 - 99 mg/dL Final   Glucose reference range applies only to samples taken after fasting for at least 8 hours.   BUN 02/27/2023 25 (  H)  8 - 23 mg/dL Final   Creatinine, Ser 02/27/2023 1.17 (H)  0.44 - 1.00 mg/dL Final   Calcium 16/07/9603 8.3 (L)  8.9 - 10.3 mg/dL Final   GFR, Estimated 02/27/2023 47 (L)  >60 mL/min Final   Comment: (NOTE) Calculated using the CKD-EPI Creatinine Equation (2021)    Anion gap 02/27/2023 9  5 - 15 Final   Performed at [facility], [address] [place], [place] [address]   WBC 02/27/2023 11.9 (H)  4.0 - 10.5 K/uL Final   RBC 02/27/2023 3.92  3.87 - 5.11 MIL/uL Final   Hemoglobin 02/27/2023 11.9 (L)  12.0 - 15.0 g/dL Final   HCT 11/91/4782 36.3  36.0 - 46.0 % Final   MCV 02/27/2023 92.6  80.0 - 100.0 fL Final   MCH 02/27/2023 30.4  26.0 - 34.0 pg Final   MCHC 02/27/2023 32.8  30.0 - 36.0 g/dL Final   RDW 95/62/1308 13.3  11.5 - 15.5 % Final   Platelets 02/27/2023 218  150 - 400 K/uL Final   nRBC 02/27/2023 0.0  0.0 - 0.2 % Final   Neutrophils Relative % 02/27/2023 78  % Final   Neutro Abs 02/27/2023 9.4 (H)  1.7 - 7.7 K/uL Final   Lymphocytes Relative 02/27/2023 16  % Final   Lymphs Abs 02/27/2023 1.9  0.7 - 4.0 K/uL Final   Monocytes  Relative 02/27/2023 4  % Final   Monocytes Absolute 02/27/2023 0.4  0.1 - 1.0 K/uL Final   Eosinophils Relative 02/27/2023 1  % Final   Eosinophils Absolute 02/27/2023 0.1  0.0 - 0.5 K/uL Final   Basophils Relative 02/27/2023 1  % Final   Basophils Absolute 02/27/2023 0.1  0.0 - 0.1 K/uL Final   Immature Granulocytes 02/27/2023 0  % Final   Abs Immature Granulocytes 02/27/2023 0.05  0.00 - 0.07 K/uL Final   Performed at [facility], [address] [place], [place] [address]   MRSA, PCR 02/27/2023 NEGATIVE  NEGATIVE Final   Staphylococcus aureus 02/27/2023 NEGATIVE  NEGATIVE Final   Comment: (NOTE) The Xpert SA Assay (FDA approved for NASAL specimens in patients 73 years of age and older), is one component of a comprehensive surveillance program. It is not intended to diagnose infection nor to guide or monitor treatment. Performed at [facility], [address] [place], [place] [address]     ECG: Date: 02/27/2023 Time ECG obtained: 1213 PM Rate: 52 bpm Rhythm:  Atrial fibrillation with slow ventricular response Axis (leads I and aVF): Normal Intervals: QRS 100 ms. QTc 448 ms. ST segment and T wave changes: Inferolateral T wave inversions in leads I, II, aVL, and V4-V6 Comparison: Last ECG performed on 12/20/2022 revealed sinus bradycardia with sinus arrhythmia and IRBBB.  There were ST and T wave abnormalities in the lateral leads. Action: New onset atrial fibrillation.  Patient referred back to cardiology for further evaluation and clearance  IMAGING / PROCEDURES: MR BRAIN WO CONTRAST performed on 01/27/2023 No acute or reversible finding.  Extensive small vessel ischemic changes throughout the brain, many with hemosiderin deposition, most consistent with chronic hypertensive disease.  Larger focal infarction in the right basal ganglia and radiating white matter tract region.  Old right frontal opercular cortical and subcortical infarction. NeuroQuant  imaging shows a similar pattern to the study of 03/19/2020, with slight further age matched volume loss of all measured structures.  CT ANGIO NECK W OR WO CONTRAST performed on 01/25/2023 60% stenosis in the  proximal right ICA. Severe stenosis at the origin of the right ECA and 50% stenosis in the left common carotid artery 70% stenosis in the left common carotid artery. No hemodynamically significant stenosis in the left ICA. Moderate stenosis at the origin of the left vertebral artery with additional multifocal mild-to-moderate stenosis in the left V1 and V2. The left V4 demonstrates multifocal moderate to severe stenosis, but is patent to the vertebrobasilar junction. Mild stenosis at the origin of the right vertebral artery and mild stenosis in the proximal right V4. Aortic atherosclerosis.  MYOCARDIAL PERFUSION IMAGING STUDY (LEXISCAN) performed on 01/08/2023 Mildly reduced left ventricular systolic function with an EF of 45% Hypokinesis of the anterior and apical walls No artifact noted Left ventricular cavity size borderline enlarged Moderate fixed perfusion abnormality of moderate intensity present in the anterior/apical region on stress images Findings concerning for infarct versus ischemia Moderate to high risk scan.  Recommend considering further invasive evaluation via cardiac catheterization.   TRANSTHORACIC ECHOCARDIOGRAM performed on 01/08/2023 Normal left ventricular systolic function with an EF of >55%.   There was moderate concentric LVH.  Left ventricular diastolic Doppler parameters consistent with abnormal relaxation (G1DD).   GLS -16.7%  Left atrium severely enlarged.   There was mild mitral, tricuspid, and pulmonary valve regurgitation.   All transvalvular gradients were noted to be normal provide no evidence of valvular stenosis.  MR LUMBAR SPINE WO CONTRAST performed on 11/27/2022 Symptomatic level favored to be L4-L5, where mild grade 1 anterolisthesis is  associated with disc and bulky posterior element degeneration - including advanced facet arthropathy and evidence of a left-side synovial cyst (4-5 mm). Subsequent severe left lateral recess stenosis. Mild spinal and bilateral foraminal stenosis. Query Left L5 radiculitis. Generally mild for age lumbar spine degeneration elsewhere.  No acute osseous abnormality or other convincing neural impingement.   Impression and Plan:  AZARIYA WALTMAN has been referred for pre-anesthesia review and clearance prior to her undergoing the planned anesthetic and procedural courses. Available labs, pertinent testing, and imaging results were personally reviewed by me in preparation for upcoming operative/procedural course. [facility] medical record has been updated following extensive record review and patient interview with PAT staff.   This patient has been appropriately cleared by cardiology with an overall LOW risk of significant perioperative cardiovascular complications. Based on clinical review performed today (03/07/23), barring any significant acute changes in the patient's overall condition, it is anticipated that she will be able to proceed with the planned surgical intervention. Any acute changes in clinical condition may necessitate her procedure being postponed and/or cancelled. Patient will meet with anesthesia team (MD and/or CRNA) on the day of her procedure for preoperative evaluation/assessment. Questions regarding anesthetic course will be fielded at that time.   Pre-surgical instructions were reviewed with the patient during her PAT appointment, and questions were fielded to satisfaction by PAT clinical staff. She has been instructed on which medications that she will need to hold prior to surgery, as well as the ones that have been deemed safe/appropriate to take on the day of her procedure. As part of the general education provided by PAT, patient made aware both verbally and in writing, that she  would need to abstain from the use of any illegal substances during her perioperative course.  She was advised that failure to follow the provided instructions could necessitate case cancellation or result in serious perioperative complications up to and including death. Patient encouraged to contact PAT and/or her surgeon's office to discuss any  questions or concerns that may arise prior to surgery; verbalized understanding.   Quentin Mulling, MSN, APRN, FNP-C, CEN [facility]  Peri-operative Services Nurse Practitioner Phone: [phone] Fax: [phone] 03/07/23 6:35 PM  NOTE: This note has been prepared using Dragon dictation software. Despite my best ability to proofread, there is always the potential that unintentional transcriptional errors may still occur from this process.

## 2023-03-08 ENCOUNTER — Encounter: Payer: Self-pay | Admitting: Vascular Surgery

## 2023-03-08 ENCOUNTER — Inpatient Hospital Stay: Payer: Medicare Other

## 2023-03-08 ENCOUNTER — Other Ambulatory Visit: Payer: Self-pay

## 2023-03-08 ENCOUNTER — Encounter
Admission: RE | Disposition: A | Discharge status: Skilled Nursing Facility | Payer: Self-pay | Source: Home / Self Care | Attending: Obstetrics and Gynecology

## 2023-03-08 ENCOUNTER — Inpatient Hospital Stay: Payer: Medicare Other | Admitting: Urgent Care

## 2023-03-08 ENCOUNTER — Telehealth: Payer: Self-pay | Admitting: Family Medicine

## 2023-03-08 ENCOUNTER — Inpatient Hospital Stay
Admission: RE | Admit: 2023-03-08 | Discharge: 2023-03-22 | DRG: 304 | Disposition: A | Discharge status: Skilled Nursing Facility | Payer: Medicare Other | Attending: Internal Medicine | Admitting: Internal Medicine

## 2023-03-08 DIAGNOSIS — E8881 Metabolic syndrome: Secondary | ICD-10-CM | POA: Diagnosis present

## 2023-03-08 DIAGNOSIS — I952 Hypotension due to drugs: Secondary | ICD-10-CM | POA: Diagnosis not present

## 2023-03-08 DIAGNOSIS — I2510 Atherosclerotic heart disease of native coronary artery without angina pectoris: Secondary | ICD-10-CM | POA: Diagnosis not present

## 2023-03-08 DIAGNOSIS — I639 Cerebral infarction, unspecified: Secondary | ICD-10-CM | POA: Diagnosis not present

## 2023-03-08 DIAGNOSIS — Z8673 Personal history of transient ischemic attack (TIA), and cerebral infarction without residual deficits: Secondary | ICD-10-CM

## 2023-03-08 DIAGNOSIS — Z7902 Long term (current) use of antithrombotics/antiplatelets: Secondary | ICD-10-CM

## 2023-03-08 DIAGNOSIS — I69392 Facial weakness following cerebral infarction: Secondary | ICD-10-CM | POA: Diagnosis not present

## 2023-03-08 DIAGNOSIS — I6389 Other cerebral infarction: Secondary | ICD-10-CM | POA: Diagnosis not present

## 2023-03-08 DIAGNOSIS — G8191 Hemiplegia, unspecified affecting right dominant side: Secondary | ICD-10-CM | POA: Diagnosis not present

## 2023-03-08 DIAGNOSIS — Z79899 Other long term (current) drug therapy: Secondary | ICD-10-CM

## 2023-03-08 DIAGNOSIS — F32A Depression, unspecified: Secondary | ICD-10-CM | POA: Diagnosis present

## 2023-03-08 DIAGNOSIS — N1831 Chronic kidney disease, stage 3a: Secondary | ICD-10-CM | POA: Diagnosis not present

## 2023-03-08 DIAGNOSIS — I6523 Occlusion and stenosis of bilateral carotid arteries: Secondary | ICD-10-CM | POA: Diagnosis not present

## 2023-03-08 DIAGNOSIS — R29703 NIHSS score 3: Secondary | ICD-10-CM | POA: Diagnosis not present

## 2023-03-08 DIAGNOSIS — E1151 Type 2 diabetes mellitus with diabetic peripheral angiopathy without gangrene: Secondary | ICD-10-CM | POA: Diagnosis present

## 2023-03-08 DIAGNOSIS — R29810 Facial weakness: Secondary | ICD-10-CM | POA: Diagnosis not present

## 2023-03-08 DIAGNOSIS — N281 Cyst of kidney, acquired: Secondary | ICD-10-CM | POA: Diagnosis not present

## 2023-03-08 DIAGNOSIS — N179 Acute kidney failure, unspecified: Secondary | ICD-10-CM | POA: Diagnosis not present

## 2023-03-08 DIAGNOSIS — E1122 Type 2 diabetes mellitus with diabetic chronic kidney disease: Secondary | ICD-10-CM | POA: Diagnosis present

## 2023-03-08 DIAGNOSIS — R4701 Aphasia: Secondary | ICD-10-CM | POA: Diagnosis not present

## 2023-03-08 DIAGNOSIS — R34 Anuria and oliguria: Secondary | ICD-10-CM | POA: Diagnosis present

## 2023-03-08 DIAGNOSIS — Z7982 Long term (current) use of aspirin: Secondary | ICD-10-CM | POA: Diagnosis not present

## 2023-03-08 DIAGNOSIS — R600 Localized edema: Secondary | ICD-10-CM | POA: Diagnosis not present

## 2023-03-08 DIAGNOSIS — I4891 Unspecified atrial fibrillation: Secondary | ICD-10-CM | POA: Diagnosis not present

## 2023-03-08 DIAGNOSIS — I959 Hypotension, unspecified: Secondary | ICD-10-CM | POA: Diagnosis not present

## 2023-03-08 DIAGNOSIS — R809 Proteinuria, unspecified: Secondary | ICD-10-CM | POA: Diagnosis not present

## 2023-03-08 DIAGNOSIS — I2489 Other forms of acute ischemic heart disease: Secondary | ICD-10-CM | POA: Diagnosis not present

## 2023-03-08 DIAGNOSIS — I130 Hypertensive heart and chronic kidney disease with heart failure and stage 1 through stage 4 chronic kidney disease, or unspecified chronic kidney disease: Secondary | ICD-10-CM | POA: Diagnosis present

## 2023-03-08 DIAGNOSIS — Z880 Allergy status to penicillin: Secondary | ICD-10-CM

## 2023-03-08 DIAGNOSIS — Z01818 Encounter for other preprocedural examination: Secondary | ICD-10-CM | POA: Diagnosis not present

## 2023-03-08 DIAGNOSIS — Z5309 Procedure and treatment not carried out because of other contraindication: Secondary | ICD-10-CM | POA: Diagnosis present

## 2023-03-08 DIAGNOSIS — R7989 Other specified abnormal findings of blood chemistry: Secondary | ICD-10-CM | POA: Diagnosis not present

## 2023-03-08 DIAGNOSIS — R001 Bradycardia, unspecified: Secondary | ICD-10-CM | POA: Diagnosis not present

## 2023-03-08 DIAGNOSIS — J309 Allergic rhinitis, unspecified: Secondary | ICD-10-CM | POA: Diagnosis not present

## 2023-03-08 DIAGNOSIS — R931 Abnormal findings on diagnostic imaging of heart and coronary circulation: Secondary | ICD-10-CM | POA: Diagnosis not present

## 2023-03-08 DIAGNOSIS — I2119 ST elevation (STEMI) myocardial infarction involving other coronary artery of inferior wall: Secondary | ICD-10-CM | POA: Diagnosis not present

## 2023-03-08 DIAGNOSIS — I5032 Chronic diastolic (congestive) heart failure: Secondary | ICD-10-CM | POA: Diagnosis present

## 2023-03-08 DIAGNOSIS — R130 Aphagia: Secondary | ICD-10-CM | POA: Diagnosis not present

## 2023-03-08 DIAGNOSIS — I169 Hypertensive crisis, unspecified: Secondary | ICD-10-CM | POA: Diagnosis not present

## 2023-03-08 DIAGNOSIS — K529 Noninfective gastroenteritis and colitis, unspecified: Secondary | ICD-10-CM | POA: Diagnosis present

## 2023-03-08 DIAGNOSIS — I63541 Cerebral infarction due to unspecified occlusion or stenosis of right cerebellar artery: Secondary | ICD-10-CM | POA: Diagnosis not present

## 2023-03-08 DIAGNOSIS — N183 Chronic kidney disease, stage 3 unspecified: Secondary | ICD-10-CM | POA: Diagnosis not present

## 2023-03-08 DIAGNOSIS — I10 Essential (primary) hypertension: Secondary | ICD-10-CM | POA: Diagnosis present

## 2023-03-08 DIAGNOSIS — N189 Chronic kidney disease, unspecified: Secondary | ICD-10-CM | POA: Diagnosis not present

## 2023-03-08 DIAGNOSIS — E7849 Other hyperlipidemia: Secondary | ICD-10-CM | POA: Diagnosis present

## 2023-03-08 DIAGNOSIS — Z01812 Encounter for preprocedural laboratory examination: Secondary | ICD-10-CM

## 2023-03-08 DIAGNOSIS — I6529 Occlusion and stenosis of unspecified carotid artery: Secondary | ICD-10-CM | POA: Diagnosis not present

## 2023-03-08 DIAGNOSIS — I69314 Frontal lobe and executive function deficit following cerebral infarction: Secondary | ICD-10-CM | POA: Diagnosis not present

## 2023-03-08 DIAGNOSIS — K5900 Constipation, unspecified: Secondary | ICD-10-CM | POA: Diagnosis not present

## 2023-03-08 DIAGNOSIS — E119 Type 2 diabetes mellitus without complications: Secondary | ICD-10-CM

## 2023-03-08 DIAGNOSIS — I6522 Occlusion and stenosis of left carotid artery: Secondary | ICD-10-CM | POA: Diagnosis not present

## 2023-03-08 DIAGNOSIS — T465X5A Adverse effect of other antihypertensive drugs, initial encounter: Secondary | ICD-10-CM | POA: Diagnosis not present

## 2023-03-08 DIAGNOSIS — I492 Junctional premature depolarization: Secondary | ICD-10-CM | POA: Diagnosis not present

## 2023-03-08 DIAGNOSIS — I480 Paroxysmal atrial fibrillation: Secondary | ICD-10-CM | POA: Diagnosis not present

## 2023-03-08 DIAGNOSIS — M6281 Muscle weakness (generalized): Secondary | ICD-10-CM | POA: Diagnosis not present

## 2023-03-08 DIAGNOSIS — I129 Hypertensive chronic kidney disease with stage 1 through stage 4 chronic kidney disease, or unspecified chronic kidney disease: Secondary | ICD-10-CM | POA: Diagnosis not present

## 2023-03-08 DIAGNOSIS — Z8249 Family history of ischemic heart disease and other diseases of the circulatory system: Secondary | ICD-10-CM

## 2023-03-08 DIAGNOSIS — I161 Hypertensive emergency: Principal | ICD-10-CM | POA: Diagnosis present

## 2023-03-08 DIAGNOSIS — I739 Peripheral vascular disease, unspecified: Secondary | ICD-10-CM | POA: Diagnosis not present

## 2023-03-08 DIAGNOSIS — R29818 Other symptoms and signs involving the nervous system: Secondary | ICD-10-CM | POA: Diagnosis not present

## 2023-03-08 DIAGNOSIS — K219 Gastro-esophageal reflux disease without esophagitis: Secondary | ICD-10-CM | POA: Diagnosis not present

## 2023-03-08 DIAGNOSIS — Z9049 Acquired absence of other specified parts of digestive tract: Secondary | ICD-10-CM

## 2023-03-08 DIAGNOSIS — R5381 Other malaise: Secondary | ICD-10-CM | POA: Diagnosis present

## 2023-03-08 DIAGNOSIS — Z87891 Personal history of nicotine dependence: Secondary | ICD-10-CM

## 2023-03-08 DIAGNOSIS — I69398 Other sequelae of cerebral infarction: Secondary | ICD-10-CM | POA: Diagnosis not present

## 2023-03-08 DIAGNOSIS — M79601 Pain in right arm: Secondary | ICD-10-CM | POA: Diagnosis not present

## 2023-03-08 DIAGNOSIS — Z888 Allergy status to other drugs, medicaments and biological substances status: Secondary | ICD-10-CM

## 2023-03-08 DIAGNOSIS — I69320 Aphasia following cerebral infarction: Secondary | ICD-10-CM | POA: Diagnosis not present

## 2023-03-08 DIAGNOSIS — I6781 Acute cerebrovascular insufficiency: Secondary | ICD-10-CM | POA: Diagnosis not present

## 2023-03-08 DIAGNOSIS — I491 Atrial premature depolarization: Secondary | ICD-10-CM | POA: Diagnosis present

## 2023-03-08 DIAGNOSIS — Z801 Family history of malignant neoplasm of trachea, bronchus and lung: Secondary | ICD-10-CM

## 2023-03-08 DIAGNOSIS — Z7901 Long term (current) use of anticoagulants: Secondary | ICD-10-CM | POA: Diagnosis not present

## 2023-03-08 DIAGNOSIS — I69391 Dysphagia following cerebral infarction: Secondary | ICD-10-CM | POA: Diagnosis not present

## 2023-03-08 DIAGNOSIS — R1312 Dysphagia, oropharyngeal phase: Secondary | ICD-10-CM | POA: Diagnosis not present

## 2023-03-08 DIAGNOSIS — Z539 Procedure and treatment not carried out, unspecified reason: Secondary | ICD-10-CM | POA: Diagnosis not present

## 2023-03-08 HISTORY — DX: Hypertrophic scar: L91.0

## 2023-03-08 HISTORY — DX: Long term (current) use of antithrombotics/antiplatelets: Z79.02

## 2023-03-08 HISTORY — DX: Atherosclerosis of aorta: I70.0

## 2023-03-08 HISTORY — DX: Mild cognitive impairment of uncertain or unknown etiology: G31.84

## 2023-03-08 HISTORY — DX: Other intervertebral disc degeneration, lumbar region: M51.36

## 2023-03-08 HISTORY — DX: Benign lipomatous neoplasm, unspecified: D17.9

## 2023-03-08 HISTORY — DX: Other intervertebral disc degeneration, lumbar region without mention of lumbar back pain or lower extremity pain: M51.369

## 2023-03-08 HISTORY — DX: Disorder of arteries and arterioles, unspecified: I77.9

## 2023-03-08 HISTORY — DX: Other ill-defined heart diseases: I51.89

## 2023-03-08 LAB — CBC WITH DIFFERENTIAL/PLATELET
Abs Immature Granulocytes: 0.1 10*3/uL — ABNORMAL HIGH (ref 0.00–0.07)
Basophils Absolute: 0.1 10*3/uL (ref 0.0–0.1)
Basophils Relative: 1 %
Eosinophils Absolute: 0.1 10*3/uL (ref 0.0–0.5)
Eosinophils Relative: 0 %
HCT: 36 % (ref 36.0–46.0)
Hemoglobin: 11.9 g/dL — ABNORMAL LOW (ref 12.0–15.0)
Immature Granulocytes: 1 %
Lymphocytes Relative: 9 %
Lymphs Abs: 1.7 10*3/uL (ref 0.7–4.0)
MCH: 30.4 pg (ref 26.0–34.0)
MCHC: 33.1 g/dL (ref 30.0–36.0)
MCV: 92.1 fL (ref 80.0–100.0)
Monocytes Absolute: 0.3 10*3/uL (ref 0.1–1.0)
Monocytes Relative: 1 %
Neutro Abs: 17.4 10*3/uL — ABNORMAL HIGH (ref 1.7–7.7)
Neutrophils Relative %: 88 %
Platelets: 172 10*3/uL (ref 150–400)
RBC: 3.91 MIL/uL (ref 3.87–5.11)
RDW: 13.6 % (ref 11.5–15.5)
WBC: 19.6 10*3/uL — ABNORMAL HIGH (ref 4.0–10.5)
nRBC: 0 % (ref 0.0–0.2)

## 2023-03-08 LAB — CBC
HCT: 34.4 % — ABNORMAL LOW (ref 36.0–46.0)
Hemoglobin: 11.4 g/dL — ABNORMAL LOW (ref 12.0–15.0)
MCH: 30.4 pg (ref 26.0–34.0)
MCHC: 33.1 g/dL (ref 30.0–36.0)
MCV: 91.7 fL (ref 80.0–100.0)
Platelets: 199 10*3/uL (ref 150–400)
RBC: 3.75 MIL/uL — ABNORMAL LOW (ref 3.87–5.11)
RDW: 13.5 % (ref 11.5–15.5)
WBC: 13.2 10*3/uL — ABNORMAL HIGH (ref 4.0–10.5)
nRBC: 0 % (ref 0.0–0.2)

## 2023-03-08 LAB — COMPREHENSIVE METABOLIC PANEL
ALT: 15 U/L (ref 0–44)
AST: 48 U/L — ABNORMAL HIGH (ref 15–41)
Albumin: 3.1 g/dL — ABNORMAL LOW (ref 3.5–5.0)
Alkaline Phosphatase: 77 U/L (ref 38–126)
Anion gap: 13 (ref 5–15)
BUN: 28 mg/dL — ABNORMAL HIGH (ref 8–23)
CO2: 16 mmol/L — ABNORMAL LOW (ref 22–32)
Calcium: 7.9 mg/dL — ABNORMAL LOW (ref 8.9–10.3)
Chloride: 108 mmol/L (ref 98–111)
Creatinine, Ser: 1.74 mg/dL — ABNORMAL HIGH (ref 0.44–1.00)
GFR, Estimated: 29 mL/min — ABNORMAL LOW (ref >60)
Glucose, Bld: 306 mg/dL — ABNORMAL HIGH (ref 70–99)
Potassium: 4.4 mmol/L (ref 3.5–5.1)
Sodium: 137 mmol/L (ref 135–145)
Total Bilirubin: 1 mg/dL (ref 0.3–1.2)
Total Protein: 5.9 g/dL — ABNORMAL LOW (ref 6.5–8.1)

## 2023-03-08 LAB — GLUCOSE, CAPILLARY
Glucose-Capillary: 75 mg/dL (ref 70–99)
Glucose-Capillary: 203 mg/dL — ABNORMAL HIGH (ref 70–99)
Glucose-Capillary: 178 mg/dL — ABNORMAL HIGH (ref 70–99)
Glucose-Capillary: 96 mg/dL (ref 70–99)

## 2023-03-08 LAB — ABO/RH: ABO/RH(D): A POS

## 2023-03-08 LAB — PHOSPHORUS: Phosphorus: 4.9 mg/dL — ABNORMAL HIGH (ref 2.5–4.6)

## 2023-03-08 LAB — TSH: TSH: 3.217 u[IU]/mL (ref 0.350–4.500)

## 2023-03-08 LAB — TROPONIN I (HIGH SENSITIVITY)
Troponin I (High Sensitivity): 6 ng/L (ref <18)
Troponin I (High Sensitivity): 28 ng/L — ABNORMAL HIGH (ref <18)

## 2023-03-08 LAB — BRAIN NATRIURETIC PEPTIDE: B Natriuretic Peptide: 433.8 pg/mL — ABNORMAL HIGH (ref 0.0–100.0)

## 2023-03-08 LAB — T4, FREE: Free T4: 1 ng/dL (ref 0.61–1.12)

## 2023-03-08 SURGERY — ENDARTERECTOMY, CAROTID
Anesthesia: General

## 2023-03-08 MED ORDER — METOPROLOL SUCCINATE ER 50 MG PO TB24
50.0000 mg | ORAL_TABLET | Freq: Every day | ORAL | Status: DC
  Filled 2023-03-08: qty 1

## 2023-03-08 MED ORDER — FAMOTIDINE 20 MG PO TABS
20.0000 mg | ORAL_TABLET | Freq: Once | ORAL | Status: AC
  Administered 2023-03-08: 20 mg via ORAL

## 2023-03-08 MED ORDER — ASPIRIN 81 MG PO TBEC
81.0000 mg | DELAYED_RELEASE_TABLET | Freq: Every day | ORAL | Status: DC
  Administered 2023-03-08 – 2023-03-21 (×14): 81 mg via ORAL
  Filled 2023-03-08 (×14): qty 1

## 2023-03-08 MED ORDER — HYDRALAZINE HCL 20 MG/ML IJ SOLN
INTRAMUSCULAR | Status: AC
  Filled 2023-03-08: qty 1

## 2023-03-08 MED ORDER — CHLORHEXIDINE GLUCONATE 0.12 % MT SOLN
15.0000 mL | Freq: Once | OROMUCOSAL | Status: AC
  Administered 2023-03-08: 15 mL via OROMUCOSAL

## 2023-03-08 MED ORDER — NITROGLYCERIN IN D5W 200-5 MCG/ML-% IV SOLN
INTRAVENOUS | Status: AC
  Filled 2023-03-08: qty 250

## 2023-03-08 MED ORDER — PHENYLEPHRINE HCL-NACL 20-0.9 MG/250ML-% IV SOLN
INTRAVENOUS | Status: AC
  Filled 2023-03-08: qty 250

## 2023-03-08 MED ORDER — PRAVASTATIN SODIUM 20 MG PO TABS
40.0000 mg | ORAL_TABLET | Freq: Every day | ORAL | Status: DC
  Administered 2023-03-08 – 2023-03-21 (×14): 40 mg via ORAL
  Filled 2023-03-08 (×14): qty 2

## 2023-03-08 MED ORDER — VANCOMYCIN HCL IN DEXTROSE 1-5 GM/200ML-% IV SOLN
INTRAVENOUS | Status: AC
  Filled 2023-03-08: qty 200

## 2023-03-08 MED ORDER — FUROSEMIDE 20 MG PO TABS
20.0000 mg | ORAL_TABLET | Freq: Every day | ORAL | Status: DC
  Administered 2023-03-08 – 2023-03-11 (×4): 20 mg via ORAL
  Filled 2023-03-08 (×4): qty 1

## 2023-03-08 MED ORDER — MIDAZOLAM HCL 2 MG/2ML IJ SOLN
INTRAMUSCULAR | Status: AC
  Filled 2023-03-08: qty 2

## 2023-03-08 MED ORDER — PROPOFOL 10 MG/ML IV BOLUS
INTRAVENOUS | Status: AC
  Filled 2023-03-08: qty 20

## 2023-03-08 MED ORDER — LIDOCAINE HCL (PF) 1 % IJ SOLN
INTRAMUSCULAR | Status: AC
  Filled 2023-03-08: qty 30

## 2023-03-08 MED ORDER — ALUM & MAG HYDROXIDE-SIMETH 200-200-20 MG/5ML PO SUSP: 15.0000 mL | ORAL | Status: DC | PRN

## 2023-03-08 MED ORDER — FLUTICASONE PROPIONATE 50 MCG/ACT NA SUSP
1.0000 | Freq: Every day | NASAL | Status: DC
  Administered 2023-03-15 – 2023-03-22 (×8): 1 via NASAL
  Filled 2023-03-08 (×2): qty 16

## 2023-03-08 MED ORDER — LOSARTAN POTASSIUM-HCTZ 100-25 MG PO TABS: 1.0000 | ORAL_TABLET | Freq: Every day | ORAL | Status: DC

## 2023-03-08 MED ORDER — ENOXAPARIN SODIUM 30 MG/0.3ML IJ SOSY
30.0000 mg | PREFILLED_SYRINGE | INTRAMUSCULAR | Status: DC
  Administered 2023-03-09 – 2023-03-11 (×3): 30 mg via SUBCUTANEOUS
  Filled 2023-03-08 (×3): qty 0.3

## 2023-03-08 MED ORDER — FAMOTIDINE 20 MG PO TABS
ORAL_TABLET | ORAL | Status: AC
  Filled 2023-03-08: qty 1

## 2023-03-08 MED ORDER — INSULIN ASPART 100 UNIT/ML IJ SOLN
0.0000 [IU] | Freq: Three times a day (TID) | INTRAMUSCULAR | Status: DC
  Administered 2023-03-12: 2 [IU] via SUBCUTANEOUS
  Administered 2023-03-13: 1 [IU] via SUBCUTANEOUS
  Administered 2023-03-13: 2 [IU] via SUBCUTANEOUS
  Administered 2023-03-14 (×2): 1 [IU] via SUBCUTANEOUS
  Administered 2023-03-14: 2 [IU] via SUBCUTANEOUS
  Administered 2023-03-15 – 2023-03-17 (×7): 1 [IU] via SUBCUTANEOUS
  Administered 2023-03-19: 2 [IU] via SUBCUTANEOUS
  Administered 2023-03-20 (×3): 1 [IU] via SUBCUTANEOUS
  Administered 2023-03-22: 2 [IU] via SUBCUTANEOUS
  Filled 2023-03-08 (×20): qty 1

## 2023-03-08 MED ORDER — MELATONIN 5 MG PO TABS
5.0000 mg | ORAL_TABLET | Freq: Every evening | ORAL | Status: DC | PRN
  Administered 2023-03-08 – 2023-03-21 (×12): 5 mg via ORAL
  Filled 2023-03-08 (×13): qty 1

## 2023-03-08 MED ORDER — HEPARIN SODIUM (PORCINE) 1000 UNIT/ML IJ SOLN
INTRAMUSCULAR | Status: AC
  Filled 2023-03-08: qty 10

## 2023-03-08 MED ORDER — SODIUM CHLORIDE 0.9 % IV SOLN
250.0000 mL | INTRAVENOUS | Status: DC
  Administered 2023-03-09: 250 mL via INTRAVENOUS

## 2023-03-08 MED ORDER — CHLORHEXIDINE GLUCONATE CLOTH 2 % EX PADS
6.0000 | MEDICATED_PAD | Freq: Once | CUTANEOUS | Status: AC
  Administered 2023-03-08: 6 via TOPICAL

## 2023-03-08 MED ORDER — PHENOL 1.4 % MT LIQD: 1.0000 | OROMUCOSAL | Status: DC | PRN

## 2023-03-08 MED ORDER — NICARDIPINE HCL IN NACL 20-0.86 MG/200ML-% IV SOLN
2.5000 mg/h | INTRAVENOUS | Status: DC
  Administered 2023-03-09: 2.5 mg/h via INTRAVENOUS
  Filled 2023-03-08: qty 200

## 2023-03-08 MED ORDER — CLONIDINE HCL 0.2 MG/24HR TD PTWK
0.2000 mg | MEDICATED_PATCH | TRANSDERMAL | Status: DC
  Filled 2023-03-08: qty 1

## 2023-03-08 MED ORDER — NITROGLYCERIN IN D5W 200-5 MCG/ML-% IV SOLN
5.0000 ug/min | INTRAVENOUS | Status: DC
  Administered 2023-03-08: 5 ug/min via INTRAVENOUS

## 2023-03-08 MED ORDER — POTASSIUM CHLORIDE CRYS ER 20 MEQ PO TBCR
20.0000 meq | EXTENDED_RELEASE_TABLET | Freq: Once | ORAL | Status: AC
  Administered 2023-03-08: 20 meq via ORAL
  Filled 2023-03-08: qty 1

## 2023-03-08 MED ORDER — VENLAFAXINE HCL ER 75 MG PO CP24
75.0000 mg | ORAL_CAPSULE | Freq: Every day | ORAL | Status: DC
  Administered 2023-03-08 – 2023-03-21 (×14): 75 mg via ORAL
  Filled 2023-03-08 (×14): qty 1

## 2023-03-08 MED ORDER — CHLORHEXIDINE GLUCONATE 0.12 % MT SOLN
OROMUCOSAL | Status: AC
  Filled 2023-03-08: qty 15

## 2023-03-08 MED ORDER — HYDRALAZINE HCL 20 MG/ML IJ SOLN
10.0000 mg | INTRAMUSCULAR | Status: DC | PRN
  Administered 2023-03-08: 10 mg via INTRAVENOUS

## 2023-03-08 MED ORDER — ORAL CARE MOUTH RINSE: 15.0000 mL | Freq: Once | OROMUCOSAL | Status: AC

## 2023-03-08 MED ORDER — ACETAMINOPHEN 325 MG RE SUPP: 325.0000 mg | RECTAL | Status: DC | PRN

## 2023-03-08 MED ORDER — MAGNESIUM HYDROXIDE 400 MG/5ML PO SUSP: 30.0000 mL | Freq: Every day | ORAL | Status: DC | PRN

## 2023-03-08 MED ORDER — METOPROLOL TARTRATE 5 MG/5ML IV SOLN: 2.0000 mg | INTRAVENOUS | Status: DC | PRN

## 2023-03-08 MED ORDER — FENTANYL CITRATE (PF) 100 MCG/2ML IJ SOLN
INTRAMUSCULAR | Status: AC
  Filled 2023-03-08: qty 2

## 2023-03-08 MED ORDER — SODIUM CHLORIDE 0.9 % IV BOLUS
500.0000 mL | Freq: Once | INTRAVENOUS | Status: AC
  Administered 2023-03-08: 500 mL via INTRAVENOUS

## 2023-03-08 MED ORDER — PANTOPRAZOLE SODIUM 40 MG PO TBEC
40.0000 mg | DELAYED_RELEASE_TABLET | Freq: Every day | ORAL | Status: DC
  Administered 2023-03-08 – 2023-03-22 (×15): 40 mg via ORAL
  Filled 2023-03-08 (×15): qty 1

## 2023-03-08 MED ORDER — NICARDIPINE HCL IN NACL 20-0.86 MG/200ML-% IV SOLN
3.0000 mg/h | INTRAVENOUS | Status: DC
  Administered 2023-03-08: 5 mg/h via INTRAVENOUS
  Filled 2023-03-08: qty 200

## 2023-03-08 MED ORDER — NITROGLYCERIN 0.2 MG/ML ON CALL CATH LAB
INTRAVENOUS | Status: DC | PRN
  Administered 2023-03-08 (×2): 40 ug via INTRAVENOUS

## 2023-03-08 MED ORDER — ENOXAPARIN SODIUM 60 MG/0.6ML IJ SOSY
0.5000 mg/kg | PREFILLED_SYRINGE | INTRAMUSCULAR | Status: DC
  Administered 2023-03-08: 45 mg via SUBCUTANEOUS
  Filled 2023-03-08: qty 0.6

## 2023-03-08 MED ORDER — HYDRALAZINE HCL 20 MG/ML IJ SOLN
INTRAMUSCULAR | Status: DC | PRN
  Administered 2023-03-08 (×2): 10 mg via INTRAVENOUS

## 2023-03-08 MED ORDER — LOSARTAN POTASSIUM 50 MG PO TABS
100.0000 mg | ORAL_TABLET | Freq: Every day | ORAL | Status: DC
  Administered 2023-03-08 – 2023-03-11 (×4): 100 mg via ORAL
  Filled 2023-03-08 (×4): qty 2

## 2023-03-08 MED ORDER — GUAIFENESIN-DM 100-10 MG/5ML PO SYRP: 15.0000 mL | ORAL_SOLUTION | ORAL | Status: DC | PRN

## 2023-03-08 MED ORDER — HYDRALAZINE HCL 20 MG/ML IJ SOLN
5.0000 mg | INTRAMUSCULAR | Status: DC | PRN
  Administered 2023-03-08: 5 mg via INTRAVENOUS
  Filled 2023-03-08: qty 1

## 2023-03-08 MED ORDER — HYDROCHLOROTHIAZIDE 25 MG PO TABS
25.0000 mg | ORAL_TABLET | Freq: Every day | ORAL | Status: DC
  Administered 2023-03-08 – 2023-03-10 (×3): 25 mg via ORAL
  Filled 2023-03-08 (×3): qty 1

## 2023-03-08 MED ORDER — LORATADINE 10 MG PO TABS
10.0000 mg | ORAL_TABLET | Freq: Every day | ORAL | Status: DC
  Administered 2023-03-08 – 2023-03-22 (×15): 10 mg via ORAL
  Filled 2023-03-08 (×15): qty 1

## 2023-03-08 MED ORDER — NOREPINEPHRINE 4 MG/250ML-% IV SOLN
INTRAVENOUS | Status: AC
  Filled 2023-03-08: qty 250

## 2023-03-08 MED ORDER — CHLORHEXIDINE GLUCONATE CLOTH 2 % EX PADS
6.0000 | MEDICATED_PAD | Freq: Every day | CUTANEOUS | Status: DC
  Administered 2023-03-08 – 2023-03-22 (×14): 6 via TOPICAL

## 2023-03-08 MED ORDER — NOREPINEPHRINE 4 MG/250ML-% IV SOLN
2.0000 ug/min | INTRAVENOUS | Status: DC
  Administered 2023-03-08: 5 ug/min via INTRAVENOUS

## 2023-03-08 MED ORDER — LOPERAMIDE HCL 2 MG PO CAPS
2.0000 mg | ORAL_CAPSULE | ORAL | Status: DC | PRN
  Administered 2023-03-08 – 2023-03-10 (×5): 2 mg via ORAL
  Filled 2023-03-08 (×5): qty 1

## 2023-03-08 MED ORDER — ONDANSETRON HCL 4 MG/2ML IJ SOLN: 4.0000 mg | Freq: Four times a day (QID) | INTRAMUSCULAR | Status: DC | PRN

## 2023-03-08 MED ORDER — LACTATED RINGERS IV SOLN: INTRAVENOUS | Status: DC

## 2023-03-08 MED ORDER — ACETAMINOPHEN 325 MG PO TABS
325.0000 mg | ORAL_TABLET | ORAL | Status: DC | PRN
  Administered 2023-03-08 – 2023-03-21 (×16): 650 mg via ORAL
  Filled 2023-03-08 (×17): qty 2

## 2023-03-08 MED ORDER — LABETALOL HCL 5 MG/ML IV SOLN: 10.0000 mg | INTRAVENOUS | Status: DC | PRN

## 2023-03-08 MED ORDER — DOCUSATE SODIUM 100 MG PO CAPS
100.0000 mg | ORAL_CAPSULE | Freq: Two times a day (BID) | ORAL | Status: DC
  Administered 2023-03-08 – 2023-03-18 (×14): 100 mg via ORAL
  Filled 2023-03-08 (×16): qty 1

## 2023-03-08 MED ORDER — VANCOMYCIN HCL IN DEXTROSE 1-5 GM/200ML-% IV SOLN
1000.0000 mg | INTRAVENOUS | Status: AC
  Administered 2023-03-08: 1000 mg via INTRAVENOUS

## 2023-03-08 MED ORDER — HEPARIN SODIUM (PORCINE) 10000 UNIT/ML IJ SOLN
INTRAMUSCULAR | Status: AC
  Filled 2023-03-08: qty 1

## 2023-03-08 MED ORDER — MIDAZOLAM HCL 2 MG/2ML IJ SOLN
INTRAMUSCULAR | Status: DC | PRN
  Administered 2023-03-08: 2 mg via INTRAVENOUS

## 2023-03-08 MED ORDER — INSULIN ASPART 100 UNIT/ML IJ SOLN: 0.0000 [IU] | Freq: Every day | INTRAMUSCULAR | Status: DC

## 2023-03-08 SURGICAL SUPPLY — 61 items
ADH SKN CLS APL DERMABOND .7 (GAUZE/BANDAGES/DRESSINGS) ×1
APL PRP STRL LF DISP 70% ISPRP (MISCELLANEOUS) ×1
BAG DECANTER FOR FLEXI CONT (MISCELLANEOUS) ×2 IMPLANT
BLADE SURG 15 STRL LF DISP TIS (BLADE) ×2 IMPLANT
BLADE SURG 15 STRL SS (BLADE) ×1
BLADE SURG SZ11 CARB STEEL (BLADE) ×2 IMPLANT
BOOT SUTURE VASCULAR YLW (MISCELLANEOUS) ×1
BRUSH SCRUB EZ  4% CHG (MISCELLANEOUS) ×1
BRUSH SCRUB EZ 4% CHG (MISCELLANEOUS) ×2 IMPLANT
CHLORAPREP W/TINT 26 (MISCELLANEOUS) ×2 IMPLANT
CLAMP SUTURE YELLOW 5 PAIRS (MISCELLANEOUS) ×2 IMPLANT
DERMABOND ADVANCED .7 DNX12 (GAUZE/BANDAGES/DRESSINGS) ×2 IMPLANT
DRAPE 3/4 80X56 (DRAPES) ×2 IMPLANT
DRAPE INCISE IOBAN 66X45 STRL (DRAPES) ×2 IMPLANT
DRAPE LAPAROTOMY 77X122 PED (DRAPES) ×2 IMPLANT
ELECT CAUTERY BLADE 6.4 (BLADE) ×2 IMPLANT
ELECT REM PT RETURN 9FT ADLT (ELECTROSURGICAL) ×1
ELECTRODE REM PT RTRN 9FT ADLT (ELECTROSURGICAL) ×2 IMPLANT
GLOVE BIO SURGEON STRL SZ7 (GLOVE) ×4 IMPLANT
GOWN STRL REUS W/ TWL LRG LVL3 (GOWN DISPOSABLE) ×6 IMPLANT
GOWN STRL REUS W/ TWL XL LVL3 (GOWN DISPOSABLE) ×4 IMPLANT
GOWN STRL REUS W/TWL LRG LVL3 (GOWN DISPOSABLE) ×3
GOWN STRL REUS W/TWL XL LVL3 (GOWN DISPOSABLE) ×2
HEMOSTAT SURGICEL 2X3 (HEMOSTASIS) ×2 IMPLANT
IV NS 250ML (IV SOLUTION) ×1
IV NS 250ML BAXH (IV SOLUTION) ×2 IMPLANT
KIT TURNOVER KIT A (KITS) ×2 IMPLANT
LABEL OR SOLS (LABEL) ×2 IMPLANT
LOOP VESSEL MAXI  1X406 RED (MISCELLANEOUS) ×2
LOOP VESSEL MAXI 1X406 RED (MISCELLANEOUS) ×4 IMPLANT
LOOP VESSEL MINI 0.8X406 BLUE (MISCELLANEOUS) ×2 IMPLANT
MANIFOLD NEPTUNE II (INSTRUMENTS) ×2 IMPLANT
NDL FILTER BLUNT 18X1 1/2 (NEEDLE) ×1 IMPLANT
NDL HYPO 25X1 1.5 SAFETY (NEEDLE) ×1 IMPLANT
NEEDLE FILTER BLUNT 18X1 1/2 (NEEDLE) ×1 IMPLANT
NEEDLE HYPO 25X1 1.5 SAFETY (NEEDLE) ×1 IMPLANT
NS IRRIG 500ML POUR BTL (IV SOLUTION) ×2 IMPLANT
PACK BASIN MAJOR ARMC (MISCELLANEOUS) ×2 IMPLANT
SET WALTER ACTIVATION W/DRAPE (SET/KITS/TRAYS/PACK) ×2 IMPLANT
SHUNT W TPORT 9FR PRUITT F3 (SHUNT) ×2 IMPLANT
SPONGE T-LAP 18X18 ~~LOC~~+RFID (SPONGE) ×4 IMPLANT
SUT MNCRL 4-0 (SUTURE) ×1
SUT MNCRL 4-0 27XMFL (SUTURE) ×1
SUT PROLENE 6 0 BV (SUTURE) ×8 IMPLANT
SUT PROLENE 7 0 BV 1 (SUTURE) ×4 IMPLANT
SUT SILK 2 0 (SUTURE) ×1
SUT SILK 2-0 18XBRD TIE 12 (SUTURE) ×2 IMPLANT
SUT SILK 3 0 (SUTURE) ×1
SUT SILK 3-0 18XBRD TIE 12 (SUTURE) ×2 IMPLANT
SUT SILK 4 0 (SUTURE) ×1
SUT SILK 4-0 18XBRD TIE 12 (SUTURE) ×2 IMPLANT
SUT VIC AB 3-0 SH 27 (SUTURE) ×2
SUT VIC AB 3-0 SH 27X BRD (SUTURE) ×4 IMPLANT
SUTURE MNCRL 4-0 27XMF (SUTURE) ×2 IMPLANT
SYR 10ML LL (SYRINGE) ×4 IMPLANT
SYR 20ML LL LF (SYRINGE) ×2 IMPLANT
TAG SUTURE CLAMP YLW 5PR (MISCELLANEOUS) ×1
TRAP FLUID SMOKE EVACUATOR (MISCELLANEOUS) ×2 IMPLANT
TRAY FOLEY MTR SLVR 16FR STAT (SET/KITS/TRAYS/PACK) ×2 IMPLANT
TUBING CONNECTING 10 (TUBING) IMPLANT
WATER STERILE IRR 500ML POUR (IV SOLUTION) ×2 IMPLANT

## 2023-03-08 NOTE — Progress Notes (Signed)
Pt arrived to unit severely hypertensive, 280-300/50-70. Pt asymptomatic AOX4, HR SB high 40's low 50's. Afebrile, Pt was started on nitro gtt per order, as well as given home meds except metoprolol (because she had already taken it this am, and her HR being SB) as well as the clonidine patch because she already had one on. Nitro gtt was eventually maxed out at 250 and still pts art line was reading 270-280's systolic. Notified Vascular MD, NP, ICU NP, pharmacist, order for cardene was put in. Aline had to be pulled due to clotting and unable to flush. Started pt on cardene gt  and titrated nitro gtt off. Pts bp was well controlled for very short time on cardene gtt.  Then pt became symptomatic, cool, clammy, sweating, minimally responsive, HR had runs of VTACH as well as converted to Junctional rhythm and  Brady in 30's, BP 108-120's systolic. Cardene gtt turned off as well as clolnidine patch taken off. Notified Vascular MD, NP and ICU NP. Gave 500 cc bolus over 1 hour with no Improvement.  ICU and cardiology consulted. Levo started as well as cxr, labs, and multitude of other tests. Pt responded well to levo, bp came up to 150's-170's pt became alert and asymptomatic again. Levo back to being off because systolic went into 190's. Pt resting in bed has had multiple bowel movements. Is being kept NPO until pt gets renal ultrasound. Will continue to monitor.

## 2023-03-08 NOTE — Anesthesia Postprocedure Evaluation (Signed)
Anesthesia Post Note  Patient: Karina Leonard  Procedure(s) Performed: ENDARTERECTOMY CAROTID (Left) CANCELLED PROCEDURE  Patient location during evaluation: SICU Anesthesia Type: General Level of consciousness: awake Pain management: pain level controlled Vital Signs Assessment: post-procedure vital signs reviewed and stable Cardiovascular status: stable Postop Assessment: no apparent nausea or vomiting Anesthetic complications: no Comments: ICU controlling hypertension   No notable events documented.   Last Vitals:  Vitals:   03/08/23 1343 03/08/23 1400  BP: 92/61 (!) 107/51  Pulse: (!) 36 (!) 34  Resp: 14 (!) 30  Temp:    SpO2: 97% 98%    Last Pain:  Vitals:   03/08/23 1200  TempSrc:   PainSc: 0-No pain                 Foye Deer

## 2023-03-08 NOTE — Anesthesia Procedure Notes (Signed)
Arterial Line Insertion Start/End5/16/2024 7:42 AM, 03/08/2023 7:47 AM Performed by: Laura Caldas, [place], CRNA, CRNA  Patient location: OR. Preanesthetic checklist: patient identified, IV checked, site marked, risks and benefits discussed, surgical consent, monitors and equipment checked, pre-op evaluation, timeout performed and anesthesia consent Lidocaine 1% used for infiltration and patient sedated Left, radial was placed Catheter size: 20 G Hand hygiene performed  and maximum sterile barriers used   Attempts: 2 Procedure performed without using ultrasound guided technique. Following insertion, dressing applied and Biopatch. Post procedure assessment: normal and unchanged  Patient tolerated the procedure well with no immediate complications.

## 2023-03-08 NOTE — Consult Note (Signed)
NAME:  Karina Leonard, MRN:  [id number], DOB:  Jan 30, 1941, LOS: 0 ADMISSION DATE:  03/08/2023, CONSULTATION DATE: 03/08/23 REFERRING MD: Dr. Wyn Quaker, CHIEF COMPLAINT: Hypotension    History of Present Illness:  This is an 82 yo female with carotid artery stenosis who presented for an elective left carotid endarterectomy per vascular surgery on 05/16.  However, procedure canceled due to severe hypertension sbp 200's.  Pt subsequently admitted to the stepdown unit per vascular surgery and nitroglycerin gtt initiated for bp control.  Pt also restarted on her outpatient antihypertensives.  However, she remained hypertensive and nicardipine gtt added.  Pt later developed symptomatic hypotension sbp 90's to low 100's.  According to pts daughters pts baseline sbp 140-160's.  PCCM team consulted and levophed gtt initiated.    Pertinent  Medical History  Aortic Atherosclerosis  Atrial Fibrillation with Slow Ventricular Response  Bilateral Carotid Artery Disease  Bradycardia  CAD Degenerative Disc Disease  Depression  Diastolic Dysfunction  HLD  HTN Lipoma  Mild Cognitive Impairment  Pre-Diabetes  Sciatica TIA  Urinary Incontinence   Significant Hospital Events: Including procedures, antibiotic start and stop dates in addition to other pertinent events   05/16: Pt initially admitted to the stepdown unit with hypertensive urgency and later developed symptomatic hypotension secondary to antihypertensive medication requiring levophed gtt PCCM team consulted to assist with management   Interim History / Subjective:  Initially upon arrival at bedside pt hypotensive map 63 with pt c/o dizziness/fatigue along with clamminess and mild shortness of breath.  Initiated levophed gtt @5  mcg/min with sbp increasing to the 150's pt stated she felt better and became interactive with family   Objective   Blood pressure (!) 171/52, pulse (!) 36, temperature (!) 97.5 F (36.4 C), temperature source Oral, resp.  rate 18, height 5\' 7"  (1.702 m), weight 88.1 kg, SpO2 98 %.        Intake/Output Summary (Last 24 hours) at 03/08/2023 1613 Last data filed at 03/08/2023 1500 Gross per 24 hour  Intake 791.89 ml  Output --  Net 791.89 ml   Filed Weights   03/08/23 0626 03/08/23 0841  Weight: 87.5 kg 88.1 kg    Examination: General: Acutely-ill appearing female,  HENT: Supple, no JVD  Lungs: Clear throughout, even, non labored  Cardiovascular: Junctional rhythm, no m/r/g, 2+ radial/1+ distal pulses, no edema  Abdomen: +BS x4, obese, soft, non tender  Extremities: Normal bulk and tone, moves all extremities  Neuro: Mildly lethargic, oriented, followings commands, PERRLA GU: External catheter draining yellow urine   Resolved Hospital Problem list     Assessment & Plan:  #Hypertension urgency~resolved  #Hypotension secondary to antihypertensive medications  Hx: Bradycardia, HLD, CAD, atrial fibrillation with slow ventricular response  Echo 01/08/23: EF >55%; mild MR/TR; trivial PR; moderate LVH - Continuous telemetry monitoring  - Prn levophed gtt to maintain sbp 140 to 160's - Hold nitroglycerin/nicardipine gtts and outpatient antihypertensives for now - TSH 3.217/Free T4 1.00 - Follow EKG's  - Trend troponin's until peaked  - US Renal Artery Duplex Complete pending  - Cardiology consulted appreciate input  - Continue outpatient asprin and statin   #Bilateral carotid stenosis (50% right common carotid artery stenosis/60% right internal carotid stenosis with a 70% left common carotid artery stenosis) - Vascular surgery consulted appreciate input: once stabilized will proceed with left carotid endarterectomy  #Acute kidney injury likely secondary to hypotension  - Trend BMP  - Replace electrolytes as indicated  - Monitor UOP - Avoid nephrotoxic medications  when able   #Chronic diarrhea  #GERD  - Continue PPI - Prn imodium   #Type II diabetes mellitus  - CBG's ac/hs  - SSI  -  Hemoglobin A1c pending   Best Practice (right click and "Reselect all SmartList Selections" daily)   Diet/type: Regular consistency (see orders) DVT prophylaxis: LMWH GI prophylaxis: PPI Lines: N/A Foley:  N/A Code Status:  full code Last date of multidisciplinary goals of care discussion [N/A]  05/16: Updated pt and pts daughter's at bedside regarding pts condition and current plan of care.  All questions were answered  Labs   CBC: Recent Labs  Lab 03/08/23 1043 03/08/23 1437  WBC 13.2* 19.6*  NEUTROABS  --  17.4*  HGB 11.4* 11.9*  HCT 34.4* 36.0  MCV 91.7 92.1  PLT 199 172    Basic Metabolic Panel: Recent Labs  Lab 03/08/23 0904 03/08/23 1437  NA  --  PENDING  K  --  PENDING  CL  --  PENDING  CO2  --  PENDING  GLUCOSE  --  PENDING  BUN  --  PENDING  CREATININE 0.31* PENDING  CALCIUM  --  PENDING   GFR: CrCl cannot be calculated (This lab value cannot be used to calculate CrCl because it is not a number: PENDING). Recent Labs  Lab 03/08/23 1043 03/08/23 1437  WBC 13.2* 19.6*    Liver Function Tests: Recent Labs  Lab 03/08/23 1437  AST PENDING  ALT PENDING  ALKPHOS PENDING  BILITOT PENDING  PROT PENDING  ALBUMIN PENDING   No results for input(s): "LIPASE", "AMYLASE" in the last 168 hours. No results for input(s): "AMMONIA" in the last 168 hours.  ABG No results found for: "PHART", "PCO2ART", "PO2ART", "HCO3", "TCO2", "ACIDBASEDEF", "O2SAT"   Coagulation Profile: No results for input(s): "INR", "PROTIME" in the last 168 hours.  Cardiac Enzymes: No results for input(s): "CKTOTAL", "CKMB", "CKMBINDEX", "TROPONINI" in the last 168 hours.  HbA1C: Hgb A1c MFr Bld  Date/Time Value Ref Range Status  02/02/2023 11:14 AM 6.0 (H) 4.8 - 5.6 % Final    Comment:             Prediabetes: 5.7 - 6.4          Diabetes: >6.4          Glycemic control for adults with diabetes: <7.0   08/02/2022 11:58 AM 5.9 (H) 4.8 - 5.6 % Final    Comment:              Prediabetes: 5.7 - 6.4          Diabetes: >6.4          Glycemic control for adults with diabetes: <7.0     CBG: Recent Labs  Lab 03/08/23 0839 03/08/23 1355  GLUCAP 75 203*    Review of Systems: Positives in BOLD   Gen: fever, chills, weight change, fatigue, night sweats HEENT: Denies blurred vision, double vision, hearing loss, tinnitus, sinus congestion, rhinorrhea, sore throat, neck stiffness, dysphagia PULM: shortness of breath, cough, sputum production, hemoptysis, wheezing CV: Denies chest pain, edema, orthopnea, paroxysmal nocturnal dyspnea, palpitations GI: Denies abdominal pain, nausea, vomiting, diarrhea, hematochezia, melena, constipation, change in bowel habits GU: Denies dysuria, hematuria, polyuria, oliguria, urethral discharge Endocrine: Denies hot or cold intolerance, polyuria, polyphagia or appetite change Derm: Denies rash, dry skin, scaling or peeling skin change Heme: Denies easy bruising, bleeding, bleeding gums Neuro: dizziness, headache, numbness, weakness, slurred speech, loss of memory or consciousness   Past Medical History:  She,  has a past medical history of Allergy, Aortic atherosclerosis (HCC), Atrial fibrillation with slow ventricular response (HCC) (02/27/2023), Bilateral carotid artery disease (HCC), Bradycardia, Cataract cortical, senile, Cerebrovascular disease, Coronary artery disease involving native coronary artery of native heart without angina pectoris, DDD (degenerative disc disease), lumbar, Depression, Diastolic dysfunction, Full dentures, Hyperlipidemia, Hypertension, Keloid, Lipoma, Long term current use of antithrombotics/antiplatelets, Mild cognitive impairment, Pre-diabetes, Sciatica, Stroke (HCC), TIA (transient ischemic attack), and Urinary incontinence.   Surgical History:   Past Surgical History:  Procedure Laterality Date   CHOLECYSTECTOMY  2009   COLONOSCOPY     RECTAL POLYPECTOMY     benign     Social History:   reports  that she quit smoking about 57 years ago. Her smoking use included cigarettes. She has a 20.00 pack-year smoking history. She has never used smokeless tobacco. She reports current alcohol use. She reports that she does not use drugs.   Family History:  Her family history includes Cancer in her father and mother; Heart attack in her sister.   Allergies Allergies  Allergen Reactions   Amoxicillin Hives   Donepezil Diarrhea   Penicillins Hives     Home Medications  Prior to Admission medications   Medication Sig Start Date End Date Taking? Authorizing Provider  aspirin EC 81 MG tablet Take 81 mg by mouth daily. Take while off of the plavix for surgey   Yes [provider]  cloNIDine (CATAPRES - DOSED IN MG/24 HR) 0.2 mg/24hr patch Place 0.2 mg onto the skin once a week. Sunday   Yes [provider]  fexofenadine (ALLEGRA) 180 MG tablet Take 1 tablet (180 mg total) by mouth daily. otc 07/22/18  Yes Duanne Limerick, MD  fluticasone (FLONASE) 50 MCG/ACT nasal spray Place 1 spray into both nostrils daily. otc   Yes [provider]  furosemide (LASIX) 20 MG tablet Take 20 mg by mouth daily. [place] 01/18/23  Yes [provider]  losartan-hydrochlorothiazide (HYZAAR) 100-25 MG tablet Take 1 tablet by mouth daily. 02/02/23  Yes Duanne Limerick, MD  lovastatin (MEVACOR) 20 MG tablet TAKE 2 TABLETS EVERY DAY Patient taking differently: Take 2 tablets by mouth at bedtime. TAKE 2 TABLETS EVERY DAY 02/02/23  Yes Duanne Limerick, MD  metoprolol succinate (TOPROL-XL) 50 MG 24 hr tablet Take 1 tablet (50 mg total) by mouth daily. [place] 05/25/22  Yes Duanne Limerick, MD  venlafaxine XR (EFFEXOR-XR) 75 MG 24 hr capsule Take 1 capsule (75 mg total) by mouth daily. Patient taking differently: Take 75 mg by mouth at bedtime. 02/02/23  Yes Duanne Limerick, MD  clopidogrel (PLAVIX) 75 MG tablet Take 1 tablet (75 mg total) by mouth daily. 02/02/23   Duanne Limerick, MD      Critical care time: 60 minutes     Zada Girt, AGNP  Pulmonary/Critical Care Pager [phone] (please enter 7 digits) PCCM Consult Pager [phone] (please enter 7 digits) a

## 2023-03-08 NOTE — Consult Note (Signed)
[facility] CARDIOLOGY CONSULT NOTE       Patient ID: ABI BAAS MRN: [id number] DOB/AGE: 1940/11/29 82 y.o.  Admit date: 03/08/2023 Referring Physician Zada Girt, NP  Primary Physician Dr. Elizabeth Sauer Primary Cardiologist Dr. Juliann Pares Reason for Consultation labile BP, junctional bradycardia   HPI: Karina Leonard is an 32yoF with a PMH of HTN, bilateral carotid stenosis (70% RICA, 80% LICA), hx CVA/TIA, HFpEF (55%), abnormal lexiscan myoview (01/08/23), sinus arrhythmia who presented to [facility] for elective left carotid endarterectomy with vascular surgery. The patient's BP was extremely elevated and procedure was cancelled and was admitted for BP control.  Cardiology is consulted for further assistance.  The patient presents with her daughter who contributes to the history.  She was in her usual state of health and presented to [facility] for elective left carotid endarterectomy with vascular surgery.  She was in the preoperative area being prepped for this procedure when her blood pressures by A-line were as high as 270/70 and the patient was asymptomatic.  Notably, the patient only took her home metoprolol succinate 50 mg and had a 0.2 mg per 24-hour clonidine patch in place prior to presenting to the hospital.  Additionally, she takes losartan-HCTZ 100-25 mg once daily and Lasix 20 mg daily and per the patient's daughter her blood pressure normally runs in the 150s-160 systolic and usual heart rate per the patient's daughter is in the 50s typically.  On telemetry earlier this morning she was in sinus bradycardia with rates 52-56 bpm.. The patient was started on a nitro drip which was eventually maxed out at 250 mcg and remained hypertensive by arterial line in the 270s to 280s systolic. She was also given her home losartan-HCTZ without improvement.  The patient's arterial line was removed due to clotting and inability to flush the line.  Dr. Wyn Quaker and PCCM were contacted by nursing  staff regarding her blood pressure, thus she was started on a nicardipine drip and nitro drip was titrated off.  Shortly after the nicardipine drip was started her blood pressure abruptly dropped to as low as 92/61 (automatic cuff) and the patient became symptomatic with dyspnea, cool to touch, clammy and minimally responsive per nursing staff.  Rhythm changed to a junctional bradycardia from 36-49 bpm.  An EKG obtained confirmed this junctional bradycardia with nonspecific T wave changes.  Thus, the patient was started on Levophed with resultant increase in her systolic blood pressure to the 150s-170s and the patient's mentation improved.  At my time of evaluation this afternoon she is laying at low incline in bed with her daughter present.  She currently states she feels sleepy but denies chest pain or shortness of breath, lightheadedness or dizziness.  She recalls the event of events and feeling short of breath but has explicitly denied chest pain.  Review of systems complete and found to be negative unless listed above     Past Medical History:  Diagnosis Date   Allergy    Aortic atherosclerosis (HCC)    Atrial fibrillation with slow ventricular response (HCC) 02/27/2023   a.) noted on preoperative ECG 02/27/2023; A.fib at 52 bpm with LVH and (+) inferolateral TWIs; b.) CHA2DS2VASc = 7 (age x 2, sex, HTN, CVA/TIA x2, vascular disease history); c.) rate/rhythm maintained on oral metoprolol; chronically antithrombotic therapy (ASA + clopidogrel)   Bilateral carotid artery disease (HCC)    a.) doppler 01/25/2023: 60% RICA, 70% LICA   Bradycardia    Cataract cortical, senile    Cerebrovascular  disease    Coronary artery disease involving native coronary artery of native heart without angina pectoris    a.) MPI 05/24/2020: EF 45-50%. Borderline ant/lat defect with border zone redistribution; b.) MPI 01/08/2023: EF 45%; mild-mod mixed ant apical defect c/w infarct vs. ischemia   DDD (degenerative  disc disease), lumbar    Depression    Diastolic dysfunction    a.) TTE 01/08/2023: EF >55%, mod LVH, sev LAE, mild MR/TR/PR, G1DD   Full dentures    Hyperlipidemia    Hypertension    Keloid    Lipoma    Long term current use of antithrombotics/antiplatelets    a.) on DAPT (ASA + clopidogrel)   Mild cognitive impairment    Pre-diabetes    Sciatica    Stroke [facility])    a.) MRI brain 03/19/2020: multiple old small vessel infarcts of the basal ganglia, thalamus and pons   TIA (transient ischemic attack)    Urinary incontinence     Past Surgical History:  Procedure Laterality Date   CHOLECYSTECTOMY  2009   COLONOSCOPY     RECTAL POLYPECTOMY     benign    Medications Prior to Admission  Medication Sig Dispense Refill Last Dose   aspirin EC 81 MG tablet Take 81 mg by mouth daily. Take while off of the plavix for surgey   03/07/2023   cloNIDine (CATAPRES - DOSED IN MG/24 HR) 0.2 mg/24hr patch Place 0.2 mg onto the skin once a week. Sunday   Past Week   fexofenadine (ALLEGRA) 180 MG tablet Take 1 tablet (180 mg total) by mouth daily. otc 90 tablet 1 Past Week   fluticasone (FLONASE) 50 MCG/ACT nasal spray Place 1 spray into both nostrils daily. otc   Past Week   furosemide (LASIX) 20 MG tablet Take 20 mg by mouth daily. callwood   Past Week   losartan-hydrochlorothiazide (HYZAAR) 100-25 MG tablet Take 1 tablet by mouth daily. 90 tablet 1 03/07/2023   lovastatin (MEVACOR) 20 MG tablet TAKE 2 TABLETS EVERY DAY (Patient taking differently: Take 2 tablets by mouth at bedtime. TAKE 2 TABLETS EVERY DAY) 180 tablet 1 03/07/2023   metoprolol succinate (TOPROL-XL) 50 MG 24 hr tablet Take 1 tablet (50 mg total) by mouth daily. [place] 90 tablet 0 03/08/2023 at 0500   venlafaxine XR (EFFEXOR-XR) 75 MG 24 hr capsule Take 1 capsule (75 mg total) by mouth daily. (Patient taking differently: Take 75 mg by mouth at bedtime.) 90 capsule 1 03/07/2023   clopidogrel (PLAVIX) 75 MG tablet Take 1 tablet (75 mg  total) by mouth daily. 90 tablet 1 03/01/2023   Social History   Socioeconomic History   Marital status: Widowed    Spouse name: Not on file   Number of children: 3   Years of education: some college   Highest education level: 12th grade  Occupational History   Occupation: Retired  Tobacco Use   Smoking status: Former    Packs/day: 1.00    Years: 20.00    Additional pack years: 0.00    Total pack years: 20.00    Types: Cigarettes    Quit date: 66    Years since quitting: 57.4   Smokeless tobacco: Never  Vaping Use   Vaping Use: Never used  Substance and Sexual Activity   Alcohol use: Yes    Alcohol/week: 0.0 standard drinks of alcohol    Comment: special occasions   Drug use: No   Sexual activity: Never  Other Topics Concern   Not  on file  Social History Narrative   Lives alone   Social Determinants of Health   Financial Resource Strain: Low Risk  (03/22/2022)   Overall Financial Resource Strain (CARDIA)    Difficulty of Paying Living Expenses: Not hard at all  Food Insecurity: No Food Insecurity (03/08/2023)   Hunger Vital Sign    Worried About Running Out of Food in the Last Year: Never true    Ran Out of Food in the Last Year: Never true  Transportation Needs: No Transportation Needs (03/08/2023)   PRAPARE - Administrator, Civil Service (Medical): No    Lack of Transportation (Non-Medical): No  Physical Activity: Insufficiently Active (03/22/2022)   Exercise Vital Sign    Days of Exercise per Week: 3 days    Minutes of Exercise per Session: 30 min  Stress: No Stress Concern Present (03/22/2022)   [employer] of Occupational Health - Occupational Stress Questionnaire    Feeling of Stress : Not at all  Social Connections: Moderately Isolated (03/22/2022)   Social Connection and Isolation Panel [NHANES]    Frequency of Communication with Friends and Family: More than three times a week    Frequency of Social Gatherings with Friends and Family:  Twice a week    Attends Religious Services: More than 4 times per year    Active Member of [employer] or Organizations: No    Attends Banker Meetings: Never    Marital Status: Widowed  Intimate Partner Violence: Not At Risk (03/08/2023)   Humiliation, Afraid, Rape, and Kick questionnaire    Fear of Current or Ex-Partner: No    Emotionally Abused: No    Physically Abused: No    Sexually Abused: No    Family History  Problem Relation Age of Onset   Cancer Mother        uterine or ovarian. Unable to remember   Cancer Father        lung   Heart attack Sister       Intake/Output Summary (Last 24 hours) at 03/08/2023 1447 Last data filed at 03/08/2023 1400 Gross per 24 hour  Intake 780.56 ml  Output --  Net 780.56 ml    Vitals:   03/08/23 1140 03/08/23 1200 03/08/23 1343 03/08/23 1400  BP: (!) 166/45 (!) 164/41 92/61 (!) 107/51  Pulse: (!) 45 (!) 43 (!) 36 (!) 34  Resp: (!) 22 18 14  (!) 30  Temp:      TempSrc:      SpO2: 97% 94% 97% 98%  Weight:      Height:        PHYSICAL EXAM General: Pleasant elderly black female, well nourished, in no acute distress.  Laying at low incline in ICU bed with daughter present. HEENT:  Normocephalic and atraumatic. Neck:  No JVD.  Lungs: Normal respiratory effort on room air. Clear bilaterally to auscultation. No wheezes, crackles, rhonchi.  Heart: Bradycardic but regular. Normal S1 and S2 without gallops or murmurs.  Abdomen: Non-distended appearing.  Msk: Normal strength and tone for age. Extremities: Warm and well perfused. No clubbing, cyanosis.  No peripheral edema.  Neuro: Alert and oriented X 3. Psych:  Answers questions appropriately.   Labs: Basic Metabolic Panel: Recent Labs    03/08/23 0904  CREATININE 0.31*   Liver Function Tests: No results for input(s): "AST", "ALT", "ALKPHOS", "BILITOT", "PROT", "ALBUMIN" in the last 72 hours. No results for input(s): "LIPASE", "AMYLASE" in the last 72 hours. CBC: Recent  Labs  03/08/23 1043 03/08/23 1437  WBC 13.2* 19.6*  NEUTROABS  --  17.4*  HGB 11.4* 11.9*  HCT 34.4* 36.0  MCV 91.7 92.1  PLT 199 172   Cardiac Enzymes: No results for input(s): "CKTOTAL", "CKMB", "CKMBINDEX", "TROPONINIHS" in the last 72 hours. BNP: No results for input(s): "BNP" in the last 72 hours. D-Dimer: No results for input(s): "DDIMER" in the last 72 hours. Hemoglobin A1C: No results for input(s): "HGBA1C" in the last 72 hours. Fasting Lipid Panel: No results for input(s): "CHOL", "HDL", "LDLCALC", "TRIG", "CHOLHDL", "LDLDIRECT" in the last 72 hours. Thyroid Function Tests: No results for input(s): "TSH", "T4TOTAL", "T3FREE", "THYROIDAB" in the last 72 hours.  Invalid input(s): "FREET3" Anemia Panel: No results for input(s): "VITAMINB12", "FOLATE", "FERRITIN", "TIBC", "IRON", "RETICCTPCT" in the last 72 hours.   Radiology: No results found.  ECHO  INTERPRETATION  NORMAL LEFT VENTRICULAR SYSTOLIC FUNCTION   WITH MODERATE LVH  NORMAL RIGHT VENTRICULAR SYSTOLIC FUNCTION  MILD VALVULAR REGURGITATION (See above)  NO VALVULAR STENOSIS  IRREGULAR HEART RHYTHM CAPTURED THROUGHOUT EXAM  ESTIMATED LVEF >55%  CALCULATED: 58.3%  GLS: -16.7%  MILD MR, TR, PI  SEVERE LAE  _________________________________________________________________________________________  Electronically signed by    Dorothyann Peng, MD on 01/08/2023 04: 59 PM           Performed By: Verdis Prime     Ordering Physician: Dorothyann Peng, MD   Steffanie Dunn 01/08/2023 Impression   Moderately abnormal myocardial perfusion scan there is evidence of left ventricular enlargement there is mild /moderate anterior apical defect of moderate intensity with mixed defect concerning for infarct versus ischemia.  Overall ejection fraction around 45% with anterior apical hypokinesis.  This is a moderate to high risk and recommend consider further evaluation invasively possibly cardiac cath  TELEMETRY  reviewed by me (LT) 03/08/2023 : Earlier a.m. sinus bradycardia rate 52-54, currently junctional bradycardia rate 36-49 with occasional PVCs  EKG reviewed by me: junctional brady with nonspecific IVCD and nonspecific T wave changes  Data reviewed by me (LT) 03/08/2023: Last outpatient cardiology note, nursing notes, vascular surgery notes , last 24h vitals tele labs imaging I/O   Principal Problem:   Malignant hypertension    ASSESSMENT AND PLAN:  Karina Leonard is an 25yoF with a PMH of HTN, bilateral carotid stenosis (70% RICA, 80% LICA), hx CVA/TIA, HFpEF (55%), abnormal lexiscan myoview (01/08/23), sinus arrhythmia who presented to [facility] for elective left carotid endarterectomy with vascular surgery. The patient's BP was extremely elevated and procedure was cancelled and was admitted for BP control.  Cardiology is consulted for further assistance.  # Malignant hypertension # Left carotid artery stenosis # Junctional bradycardia  Suspect the patient's presyncopal symptoms and minimal responsiveness this afternoon were iatrogenic from aggressive measures to control her BP with rapid blood pressure fluctuations from 280/70s down to as low as 90s/60s.  Symptoms improved after her BP rose back to 150 systolic.  She has explicitly denied chest pain and is currently asymptomatic despite her junctional bradycardia with ventricular rate 35-40s.  EKGs do show nonspecific IVCD and nonspecific T wave changes which, in the absence of chest pain and in the setting of very labile BPs is non diagnostic. -Agree with current therapy per PCCM and vascular surgery -Agree to maintain vasopressor support for SBP goal 150s-160s -No indication for temporary transvenous pacemaker or permanent pacemaker at this time -Discontinue BB, CCB's -consider hydral TID for refractory hypertension -Agree with renal ultrasound to rule out causes of secondary hypertension -Monitor and  replenish electrolytes for goal K >4,  mag >2 -Trend troponin until peak -Defer additional cardiac diagnostics at this time  # Abnormal Lexiscan Myoview Patient has a history of abnormal Lexiscan Myoview in 2021 (anterolateral defect with border zone redistribution, mild LV dilatation during stress and moderately reduced EF of 45-50%).  Most recently in April 2024 with moderate anterior apical hypokinesis with a mixed fixed versus reversible defect, it seems that since the patient was asymptomatic, chest pain-free further invasive cardiac diagnostics were deferred. -As above, defer additional cardiac diagnostics at this time   This patient's plan of care was discussed and created with Dr. Juliann Pares  and he is in agreement.  Signed: Rebeca Allegra , PA-C 03/08/2023, 2:47 PM [facility] Cardiology

## 2023-03-08 NOTE — Telephone Encounter (Signed)
Copied from CRM [id number]. Topic: Medicare AWV >> Mar 08, 2023 10:18 AM Payton Doughty wrote: Reason for CRM: Called patient to schedule Medicare Annual Wellness Visit (AWV). Left message for patient to call back and schedule Medicare Annual Wellness Visit (AWV).  Last date of AWV: 03/22/22  Please schedule an appointment at any time with Kennedy Bucker, LPN  .  If any questions, please contact me.  Thank you ,  Verlee Rossetti; Care Guide Ambulatory Clinical Support [place] [facility] Direct Dial: [phone]

## 2023-03-08 NOTE — Interval H&P Note (Signed)
History and Physical Interval Note:  03/08/2023 7:21 AM  [name]  has presented today for surgery, with the diagnosis of CAROTID ARTERY STENOSIS.  The various methods of treatment have been discussed with the patient and family. After consideration of risks, benefits and other options for treatment, the patient has consented to  Procedure(s): ENDARTERECTOMY CAROTID (Left) as a surgical intervention.  The patient's history has been reviewed, patient examined, no change in status, stable for surgery.  I have reviewed the patient's chart and labs.  Questions were answered to the patient's satisfaction.     Karina Leonard

## 2023-03-08 NOTE — Anesthesia Preprocedure Evaluation (Addendum)
Anesthesia Evaluation  Patient identified by MRN, date of birth, ID band Patient awake    Reviewed: Allergy & Precautions, H&P , NPO status , Patient's Chart, lab work & pertinent test results  Airway Mallampati: II  TM Distance: >3 FB Neck ROM: full    Dental  (+) Edentulous Upper, Edentulous Lower   Pulmonary former smoker   Pulmonary exam normal        Cardiovascular Exercise Tolerance: Poor hypertension (controlled per family and chart review. Elevated today to 180 systolic), Pt. on home beta blockers and Pt. on medications + CAD and + Peripheral Vascular Disease  + dysrhythmias Atrial Fibrillation  Rhythm:Irregular Rate:Bradycardia  TTE 01/08/2023: EF >55%, mod LVH, sev LAE, mild MR/TR/PR, G1DD  PAD: CT angiography study of the neck performed on 01/25/2023 revealed a 60% stenosis of the proximal RICA with contralateral 70% stenosis of the LICA  MYOCARDIAL PERFUSION IMAGING STUDY (LEXISCAN) performed on 01/08/2023 1. Mildly reduced left ventricular systolic function with an EF of 45% 2. Hypokinesis of the anterior and apical walls 3. No artifact noted 4. Left ventricular cavity size borderline enlarged 5. Moderate fixed perfusion abnormality of moderate intensity present in the anterior/apical region on stress images 6. Findings concerning for infarct versus ischemia 7. Moderate to high risk scan.  Recommend considering further invasive evaluation via cardiac catheterization.    Neuro/Psych  PSYCHIATRIC DISORDERS Anxiety Depression    mild cognitive impairment.TIA (remote)CVA, No Residual Symptoms    GI/Hepatic negative GI ROS, Neg liver ROS,,,  Endo/Other  diabetes (pre-diabetes)    Renal/GU Renal InsufficiencyRenal disease     Musculoskeletal   Abdominal  (+) + obese  Peds  Hematology  (+) Blood dyscrasia, anemia   Anesthesia Other Findings Pre-anaesthesia evaluation and cardiology review noted  Past  Medical History: No date: Allergy No date: Aortic atherosclerosis (HCC) 02/27/2023: Atrial fibrillation with slow ventricular response (HCC)     Comment:  a.) noted on preoperative ECG 02/27/2023; A.fib at 52               bpm with LVH and (+) inferolateral TWIs; b.) CHA2DS2VASc               = 7 (age x 2, sex, HTN, CVA/TIA x2, vascular disease               history); c.) rate/rhythm maintained on oral metoprolol;               chronically antithrombotic therapy (ASA + clopidogrel) No date: Bilateral carotid artery disease (HCC)     Comment:  a.) doppler 01/25/2023: 60% RICA, 70% LICA No date: Bradycardia No date: Cataract cortical, senile No date: Cerebrovascular disease No date: Coronary artery disease involving native coronary artery of  native heart without angina pectoris     Comment:  a.) MPI 05/24/2020: EF 45-50%. Borderline ant/lat defect              with border zone redistribution; b.) MPI 01/08/2023: EF               45%; mild-mod mixed ant apical defect c/w infarct vs.               ischemia No date: DDD (degenerative disc disease), lumbar No date: Depression No date: Diastolic dysfunction     Comment:  a.) TTE 01/08/2023: EF >55%, mod LVH, sev LAE, mild               MR/TR/PR, G1DD No date: Full dentures No date:  Hyperlipidemia No date: Hypertension No date: Keloid No date: Lipoma No date: Long term current use of antithrombotics/antiplatelets     Comment:  a.) on DAPT (ASA + clopidogrel) No date: Mild cognitive impairment No date: Pre-diabetes No date: Sciatica No date: Stroke [facility])     Comment:  a.) MRI brain 03/19/2020: multiple old small vessel               infarcts of the basal ganglia, thalamus and pons No date: TIA (transient ischemic attack) No date: Urinary incontinence  Past Surgical History: 2009: CHOLECYSTECTOMY No date: COLONOSCOPY No date: RECTAL POLYPECTOMY     Comment:  benign  BMI    Body Mass Index: 30.23 kg/m       Reproductive/Obstetrics negative OB ROS                             Anesthesia Physical Anesthesia Plan  ASA: 3  Anesthesia Plan: General ETT   Post-op Pain Management: Ofirmev IV (intra-op)* and Regional block*   Induction: Intravenous  PONV Risk Score and Plan: 2 and Ondansetron and Dexamethasone  Airway Management Planned: Oral ETT  Additional Equipment: Arterial line  Intra-op Plan:   Post-operative Plan: Extubation in OR  Informed Consent: I have reviewed the patients History and Physical, chart, labs and discussed the procedure including the risks, benefits and alternatives for the proposed anesthesia with the patient or authorized representative who has indicated his/her understanding and acceptance.     Dental Advisory Given  Plan Discussed with: CRNA and Surgeon  Anesthesia Plan Comments:        Anesthesia Quick Evaluation

## 2023-03-08 NOTE — Transfer of Care (Signed)
Immediate Anesthesia Transfer of Care Note  Patient: Karina Leonard  Procedure(s) Performed: ENDARTERECTOMY CAROTID (Left) CANCELLED PROCEDURE  Patient Location: ICU  Anesthesia Type: Pt given 2mg  Versed. No other anesthesia due to case cancellation.  Level of Consciousness: awake, alert , and oriented  Airway & Oxygen Therapy: Patient Spontanous Breathing  Post-op Assessment: Report given to RN and Post -op Vital signs reviewed and stable  Post vital signs: Reviewed  Last Vitals:  Vitals Value Taken Time  BP 263/75 03/08/23   0846  Temp 36.4 C 03/08/23 0841  Pulse 61 03/08/23 0850  Resp 19 03/08/23 0850  SpO2 97 % 03/08/23 0850  Vitals shown include unvalidated device data.  Last Pain:  Vitals:   03/08/23 0841  TempSrc: Oral  PainSc: 0-No pain         Complications: No notable events documented.

## 2023-03-08 NOTE — Progress Notes (Signed)
Patient taken back to the OR and BP found to be markedly elevated.  Blood pressure readings about 280/80 by arterial line and about 250/90 by the cuff. At that level, clearly too high to operate on her today.  At that level, risk of stroke is present and we will admit her for blood pressure control.  Will ask the hospitalist to help assist with blood pressure management.  Will give her her home medications to see if that will bring her down some as well.

## 2023-03-09 LAB — CBC
HCT: 35.5 % — ABNORMAL LOW (ref 36.0–46.0)
Hemoglobin: 11.9 g/dL — ABNORMAL LOW (ref 12.0–15.0)
MCH: 30.9 pg (ref 26.0–34.0)
MCHC: 33.5 g/dL (ref 30.0–36.0)
MCV: 92.2 fL (ref 80.0–100.0)
Platelets: 204 10*3/uL (ref 150–400)
RBC: 3.85 MIL/uL — ABNORMAL LOW (ref 3.87–5.11)
RDW: 13.9 % (ref 11.5–15.5)
WBC: 20.6 10*3/uL — ABNORMAL HIGH (ref 4.0–10.5)
nRBC: 0 % (ref 0.0–0.2)

## 2023-03-09 LAB — HEMOGLOBIN A1C
Hgb A1c MFr Bld: 5.7 % — ABNORMAL HIGH (ref 4.8–5.6)
Mean Plasma Glucose: 116.89 mg/dL

## 2023-03-09 LAB — COMPREHENSIVE METABOLIC PANEL
ALT: 37 U/L (ref 0–44)
AST: 82 U/L — ABNORMAL HIGH (ref 15–41)
Albumin: 3.4 g/dL — ABNORMAL LOW (ref 3.5–5.0)
Alkaline Phosphatase: 84 U/L (ref 38–126)
Anion gap: 8 (ref 5–15)
BUN: 38 mg/dL — ABNORMAL HIGH (ref 8–23)
CO2: 23 mmol/L (ref 22–32)
Calcium: 8.2 mg/dL — ABNORMAL LOW (ref 8.9–10.3)
Chloride: 107 mmol/L (ref 98–111)
Creatinine, Ser: 2.38 mg/dL — ABNORMAL HIGH (ref 0.44–1.00)
GFR, Estimated: 20 mL/min — ABNORMAL LOW (ref >60)
Glucose, Bld: 125 mg/dL — ABNORMAL HIGH (ref 70–99)
Potassium: 4.5 mmol/L (ref 3.5–5.1)
Sodium: 138 mmol/L (ref 135–145)
Total Bilirubin: 0.9 mg/dL (ref 0.3–1.2)
Total Protein: 6.5 g/dL (ref 6.5–8.1)

## 2023-03-09 LAB — GLUCOSE, CAPILLARY
Glucose-Capillary: 103 mg/dL — ABNORMAL HIGH (ref 70–99)
Glucose-Capillary: 116 mg/dL — ABNORMAL HIGH (ref 70–99)
Glucose-Capillary: 119 mg/dL — ABNORMAL HIGH (ref 70–99)
Glucose-Capillary: 98 mg/dL (ref 70–99)

## 2023-03-09 LAB — TROPONIN I (HIGH SENSITIVITY): Troponin I (High Sensitivity): 353 ng/L (ref <18)

## 2023-03-09 LAB — PHOSPHORUS: Phosphorus: 3.2 mg/dL (ref 2.5–4.6)

## 2023-03-09 LAB — MAGNESIUM: Magnesium: 1.6 mg/dL — ABNORMAL LOW (ref 1.7–2.4)

## 2023-03-09 MED ORDER — HYDRALAZINE HCL 20 MG/ML IJ SOLN
10.0000 mg | Freq: Once | INTRAMUSCULAR | Status: AC
  Administered 2023-03-09: 10 mg via INTRAVENOUS

## 2023-03-09 MED ORDER — CLEVIDIPINE BUTYRATE 0.5 MG/ML IV EMUL
0.5000 mg/h | INTRAVENOUS | Status: DC
  Administered 2023-03-10: 10 mg/h via INTRAVENOUS
  Administered 2023-03-10: 0.5 mg/h via INTRAVENOUS
  Administered 2023-03-10 (×3): 10 mg/h via INTRAVENOUS
  Filled 2023-03-09 (×5): qty 50

## 2023-03-09 MED ORDER — HYDRALAZINE HCL 20 MG/ML IJ SOLN
10.0000 mg | INTRAMUSCULAR | Status: DC | PRN
  Administered 2023-03-09: 10 mg via INTRAVENOUS
  Administered 2023-03-09: 20 mg via INTRAVENOUS
  Filled 2023-03-09 (×3): qty 1

## 2023-03-09 MED ORDER — HYDRALAZINE HCL 50 MG PO TABS
50.0000 mg | ORAL_TABLET | Freq: Three times a day (TID) | ORAL | Status: DC
  Administered 2023-03-09 – 2023-03-10 (×3): 50 mg via ORAL
  Filled 2023-03-09 (×3): qty 1

## 2023-03-09 MED ORDER — MAGNESIUM SULFATE 2 GM/50ML IV SOLN
2.0000 g | Freq: Once | INTRAVENOUS | Status: AC
  Administered 2023-03-09: 2 g via INTRAVENOUS
  Filled 2023-03-09: qty 50

## 2023-03-09 MED ORDER — HYDRALAZINE HCL 50 MG PO TABS
25.0000 mg | ORAL_TABLET | Freq: Three times a day (TID) | ORAL | Status: DC
  Administered 2023-03-09: 25 mg via ORAL
  Filled 2023-03-09: qty 1

## 2023-03-09 MED ORDER — HYDRALAZINE HCL 20 MG/ML IJ SOLN: 10.0000 mg | INTRAMUSCULAR | Status: DC | PRN

## 2023-03-09 NOTE — Progress Notes (Signed)
[facility] CARDIOLOGY CONSULT NOTE       Patient ID: Karina Leonard MRN: [id number] DOB/AGE: 80-Aug-1942 80 y.o.  Admit date: 03/08/2023 Referring Physician Zada Girt, NP  Primary Physician Dr. Elizabeth Sauer Primary Cardiologist Dr. Juliann Pares Reason for Consultation labile BP, junctional bradycardia   HPI: Karina Leonard is an 6yoF with a PMH of HTN, bilateral carotid stenosis (70% RICA, 80% LICA), hx CVA/TIA, HFpEF (55%), abnormal lexiscan myoview (01/08/23), sinus arrhythmia who presented to [facility] for elective left carotid endarterectomy with vascular surgery. The patient's BP was extremely elevated and procedure was cancelled and was admitted for BP control.  Cardiology is consulted for further assistance.  Interval History:  -Ongoing titration titration of antihypertensives overnight, required IV Cardene for a brief period with resultant presyncopal symptoms, improved after BP rose 160s systolic. -Feels overall better today -Continues to deny chest pain, shortness of breath, lightheadedness or dizziness -In a junctional bradycardia predominantly in the low 40s to sinus bradycardia high 40s low 50s on telemetry without evidence of long pauses, high-grade AV block, or atrial fibrillation.  Review of systems complete and found to be negative unless listed above     Past Medical History:  Diagnosis Date   Allergy    Aortic atherosclerosis (HCC)    Atrial fibrillation with slow ventricular response (HCC) 02/27/2023   a.) noted on preoperative ECG 02/27/2023; A.fib at 52 bpm with LVH and (+) inferolateral TWIs; b.) CHA2DS2VASc = 7 (age x 2, sex, HTN, CVA/TIA x2, vascular disease history); c.) rate/rhythm maintained on oral metoprolol; chronically antithrombotic therapy (ASA + clopidogrel)   Bilateral carotid artery disease (HCC)    a.) doppler 01/25/2023: 60% RICA, 70% LICA   Bradycardia    Cataract cortical, senile    Cerebrovascular disease    Coronary artery disease  involving native coronary artery of native heart without angina pectoris    a.) MPI 05/24/2020: EF 45-50%. Borderline ant/lat defect with border zone redistribution; b.) MPI 01/08/2023: EF 45%; mild-mod mixed ant apical defect c/w infarct vs. ischemia   DDD (degenerative disc disease), lumbar    Depression    Diastolic dysfunction    a.) TTE 01/08/2023: EF >55%, mod LVH, sev LAE, mild MR/TR/PR, G1DD   Full dentures    Hyperlipidemia    Hypertension    Keloid    Lipoma    Long term current use of antithrombotics/antiplatelets    a.) on DAPT (ASA + clopidogrel)   Mild cognitive impairment    Pre-diabetes    Sciatica    Stroke [facility])    a.) MRI brain 03/19/2020: multiple old small vessel infarcts of the basal ganglia, thalamus and pons   TIA (transient ischemic attack)    Urinary incontinence     Past Surgical History:  Procedure Laterality Date   CHOLECYSTECTOMY  2009   COLONOSCOPY     RECTAL POLYPECTOMY     benign    Medications Prior to Admission  Medication Sig Dispense Refill Last Dose   aspirin EC 81 MG tablet Take 81 mg by mouth daily. Take while off of the plavix for surgey   03/07/2023   cloNIDine (CATAPRES - DOSED IN MG/24 HR) 0.2 mg/24hr patch Place 0.2 mg onto the skin once a week. Sunday   Past Week   fexofenadine (ALLEGRA) 180 MG tablet Take 1 tablet (180 mg total) by mouth daily. otc 90 tablet 1 Past Week   fluticasone (FLONASE) 50 MCG/ACT nasal spray Place 1 spray into both nostrils daily. otc  Past Week   furosemide (LASIX) 20 MG tablet Take 20 mg by mouth daily. callwood   Past Week   losartan-hydrochlorothiazide (HYZAAR) 100-25 MG tablet Take 1 tablet by mouth daily. 90 tablet 1 03/07/2023   lovastatin (MEVACOR) 20 MG tablet TAKE 2 TABLETS EVERY DAY (Patient taking differently: Take 2 tablets by mouth at bedtime. TAKE 2 TABLETS EVERY DAY) 180 tablet 1 03/07/2023   metoprolol succinate (TOPROL-XL) 50 MG 24 hr tablet Take 1 tablet (50 mg total) by mouth daily. [place]  90 tablet 0 03/08/2023 at 0500   venlafaxine XR (EFFEXOR-XR) 75 MG 24 hr capsule Take 1 capsule (75 mg total) by mouth daily. (Patient taking differently: Take 75 mg by mouth at bedtime.) 90 capsule 1 03/07/2023   clopidogrel (PLAVIX) 75 MG tablet Take 1 tablet (75 mg total) by mouth daily. 90 tablet 1 03/01/2023   Social History   Socioeconomic History   Marital status: Widowed    Spouse name: Not on file   Number of children: 3   Years of education: some college   Highest education level: 12th grade  Occupational History   Occupation: Retired  Tobacco Use   Smoking status: Former    Packs/day: 1.00    Years: 20.00    Additional pack years: 0.00    Total pack years: 20.00    Types: Cigarettes    Quit date: 67    Years since quitting: 57.4   Smokeless tobacco: Never  Vaping Use   Vaping Use: Never used  Substance and Sexual Activity   Alcohol use: Yes    Alcohol/week: 0.0 standard drinks of alcohol    Comment: special occasions   Drug use: No   Sexual activity: Never  Other Topics Concern   Not on file  Social History Narrative   Lives alone   Social Determinants of Health   Financial Resource Strain: Low Risk  (03/22/2022)   Overall Financial Resource Strain (CARDIA)    Difficulty of Paying Living Expenses: Not hard at all  Food Insecurity: No Food Insecurity (03/08/2023)   Hunger Vital Sign    Worried About Running Out of Food in the Last Year: Never true    Ran Out of Food in the Last Year: Never true  Transportation Needs: No Transportation Needs (03/08/2023)   PRAPARE - Administrator, Civil Service (Medical): No    Lack of Transportation (Non-Medical): No  Physical Activity: Insufficiently Active (03/22/2022)   Exercise Vital Sign    Days of Exercise per Week: 3 days    Minutes of Exercise per Session: 30 min  Stress: No Stress Concern Present (03/22/2022)   [employer] of Occupational Health - Occupational Stress Questionnaire    Feeling of  Stress : Not at all  Social Connections: Moderately Isolated (03/22/2022)   Social Connection and Isolation Panel [NHANES]    Frequency of Communication with Friends and Family: More than three times a week    Frequency of Social Gatherings with Friends and Family: Twice a week    Attends Religious Services: More than 4 times per year    Active Member of [employer] or Organizations: No    Attends Banker Meetings: Never    Marital Status: Widowed  Intimate Partner Violence: Not At Risk (03/08/2023)   Humiliation, Afraid, Rape, and Kick questionnaire    Fear of Current or Ex-Partner: No    Emotionally Abused: No    Physically Abused: No    Sexually Abused: No  Family History  Problem Relation Age of Onset   Cancer Mother        uterine or ovarian. Unable to remember   Cancer Father        lung   Heart attack Sister       Intake/Output Summary (Last 24 hours) at 03/09/2023 0944 Last data filed at 03/09/2023 0600 Gross per 24 hour  Intake 725.61 ml  Output 200 ml  Net 525.61 ml     Vitals:   03/09/23 0645 03/09/23 0700 03/09/23 0800 03/09/23 0900  BP: (!) 169/93 (!) 180/53 (!) 143/93 (!) 176/56  Pulse: (!) 39 (!) 43 (!) 43 (!) 49  Resp: (!) 22 (!) 21 18 18   Temp:    98.7 F (37.1 C)  TempSrc:    Oral  SpO2: 95% 94% 96% 96%  Weight:      Height:        PHYSICAL EXAM General: Pleasant elderly black female, well nourished, in no acute distress.  Sitting upright in ICU bed with 2 daughters present.   HEENT:  Normocephalic and atraumatic. Neck:  No JVD.  Lungs: Normal respiratory effort on room air. Clear bilaterally to auscultation. No wheezes, crackles, rhonchi.  Heart: Bradycardic but regular. Normal S1 and S2 without gallops or murmurs.  Abdomen: Non-distended appearing.  Msk: Normal strength and tone for age. Extremities: Warm and well perfused. No clubbing, cyanosis.  No peripheral edema.  Neuro: Alert and oriented X 3. Psych:  Answers questions  appropriately.   Labs: Basic Metabolic Panel: Recent Labs    03/08/23 1436 03/08/23 1437 03/09/23 0406  NA  --  137 138  K  --  4.4 4.5  CL  --  108 107  CO2  --  16* 23  GLUCOSE  --  306* 125*  BUN  --  28* 38*  CREATININE  --  1.74* 2.38*  CALCIUM  --  7.9* 8.2*  MG  --   --  1.6*  PHOS 4.9*  --  3.2    Liver Function Tests: Recent Labs    03/08/23 1437 03/09/23 0406  AST 48* 82*  ALT 15 37  ALKPHOS 77 84  BILITOT 1.0 0.9  PROT 5.9* 6.5  ALBUMIN 3.1* 3.4*   No results for input(s): "LIPASE", "AMYLASE" in the last 72 hours. CBC: Recent Labs    03/08/23 1437 03/09/23 0406  WBC 19.6* 20.6*  NEUTROABS 17.4*  --   HGB 11.9* 11.9*  HCT 36.0 35.5*  MCV 92.1 92.2  PLT 172 204    Cardiac Enzymes: Recent Labs    03/08/23 1437 03/08/23 1756 03/09/23 0406  TROPONINIHS 6 28* 353*   BNP: Recent Labs    03/08/23 1437  BNP 433.8*   D-Dimer: No results for input(s): "DDIMER" in the last 72 hours. Hemoglobin A1C: Recent Labs    03/08/23 1756  HGBA1C 5.7*   Fasting Lipid Panel: No results for input(s): "CHOL", "HDL", "LDLCALC", "TRIG", "CHOLHDL", "LDLDIRECT" in the last 72 hours. Thyroid Function Tests: Recent Labs    03/08/23 1436  TSH 3.217   Anemia Panel: No results for input(s): "VITAMINB12", "FOLATE", "FERRITIN", "TIBC", "IRON", "RETICCTPCT" in the last 72 hours.   Radiology: US RENAL ARTERY DUPLEX LIMITED  Result Date: 03/08/2023 CLINICAL DATA:  Malignant hypertension EXAM: RENAL/URINARY TRACT ULTRASOUND RENAL DUPLEX DOPPLER ULTRASOUND COMPARISON:  CT 04/05/2008 FINDINGS: Right Kidney: Length: 9.8 x 4.8 x 5.3 cm (128 cc). Isoechoic to adjacent liver. 1.6 x 1.3 x 1.2 cm midpole cyst. No hydronephrosis.  Left Kidney: Length: 10.6 x 5.5 x 4.7 cm (144 cc). No hydronephrosis. 2.3 x 2 cm lower pole cortical cyst. Bladder:  Not imaged RENAL DUPLEX ULTRASOUND Right Renal Artery Velocities: Origin:  43.8 cm/sec Mid:  47.6 cm/sec Hilum:  43.2 cm/sec Left  Renal Artery Velocities: Origin:  70.2 cm/sec Mid:  67.8 cm/sec Hilum:  67.7 cm/sec Aortic Velocity:  141 cm/sec All renal-aortic ratios are well under 1.0. Technologist describes technically difficult study secondary to bowel gas and patient inability to hold breath. IMPRESSION: 1. No Doppler ultrasound evidence of hemodynamically significant renal artery stenosis. 2. If there is continued clinical concern, renal MRA (lower radiation risk, can be performed noncontrast in the setting of renal dysfunction) and CTA ( higher spatial resolution) represent more accurate studies, which are additionally more sensitive to the detection of duplicated renal arteries. Electronically Signed   By: Corlis Leak M.D.   On: 03/08/2023 18:26   DG Chest Port 1 View  Result Date: 03/08/2023 CLINICAL DATA:  Hypotension EXAM: PORTABLE CHEST 1 VIEW COMPARISON:  08/22/2007 FINDINGS: Single frontal view of the chest demonstrates an enlarged cardiac silhouette. There is atherosclerosis of the thoracic aorta, bilateral subclavian arteries, and bilateral carotid arteries. No acute airspace disease, effusion, or pneumothorax. No acute bony abnormalities. IMPRESSION: 1. Enlarged cardiac silhouette. 2. No acute airspace disease. 3. Extensive atherosclerosis. Electronically Signed   By: Sharlet Salina M.D.   On: 03/08/2023 14:53    ECHO  INTERPRETATION  NORMAL LEFT VENTRICULAR SYSTOLIC FUNCTION   WITH MODERATE LVH  NORMAL RIGHT VENTRICULAR SYSTOLIC FUNCTION  MILD VALVULAR REGURGITATION (See above)  NO VALVULAR STENOSIS  IRREGULAR HEART RHYTHM CAPTURED THROUGHOUT EXAM  ESTIMATED LVEF >55%  CALCULATED: 58.3%  GLS: -16.7%  MILD MR, TR, PI  SEVERE LAE  _________________________________________________________________________________________  Electronically signed by    Dorothyann Peng, MD on 01/08/2023 04: 59 PM           Performed By: Verdis Prime     Ordering Physician: Dorothyann Peng, MD   Steffanie Dunn  01/08/2023 Impression   Moderately abnormal myocardial perfusion scan there is evidence of left ventricular enlargement there is mild /moderate anterior apical defect of moderate intensity with mixed defect concerning for infarct versus ischemia.  Overall ejection fraction around 45% with anterior apical hypokinesis.  This is a moderate to high risk and recommend consider further evaluation invasively possibly cardiac cath  TELEMETRY reviewed by me (LT) 03/09/2023 : Junctional bradycardia rate 44 to sinus bradycardia with rate 52-54.  EKG reviewed by me: junctional brady with nonspecific IVCD and nonspecific T wave changes  Data reviewed by me (LT) 03/09/2023: Nursing notes, PCCM notes, vascular surgery notes , last 24h vitals tele labs imaging I/O   Principal Problem:   Malignant hypertension    ASSESSMENT AND PLAN:  Karina Leonard is an 35yoF with a PMH of HTN, bilateral carotid stenosis (70% RICA, 80% LICA), hx CVA/TIA, HFpEF (55%), abnormal lexiscan myoview (01/08/23), sinus arrhythmia who presented to [facility] for elective left carotid endarterectomy with vascular surgery. The patient's BP was extremely elevated and procedure was cancelled and was admitted for BP control.  Cardiology is consulted for further assistance.  # Malignant hypertension # Left carotid artery stenosis # Junctional bradycardia  # Demand ischemia Suspect the patient's presyncopal symptoms and minimal responsiveness this afternoon were iatrogenic from aggressive measures to control her BP with rapid blood pressure fluctuations from 280/70s down to as low as 90s/60s.  Symptoms improved after her BP rose back to  150 systolic.  She has explicitly denied chest pain and is currently asymptomatic despite her bradycardia.  EKGs do show nonspecific IVCD and nonspecific T wave changes which, in the absence of chest pain and in the setting of very labile BPs is non diagnostic.  Troponin elevation is also most consistent  with demand ischemia in the setting of labile BP and not ACS. -Agree with current therapy per PCCM and vascular surgery -No indication for temporary transvenous pacemaker or permanent pacemaker at this time -Discontinue clonidine patch as this is likely contributory to bradycardia -Discontinue metoprolol -Start hydralazine 25 mg TID  -Can add amlodipine 5 mg twice daily as an adjunct probably on an outpatient basis to achieve vascular surgery's goal of 110-140s systolic prior to proceeding with endarterectomy. -Continue losartan-HCTZ -Agree with imaging to evaluate for causes of secondary hypertension -Monitor and replenish electrolytes for goal K >4, mag >2 -Defer additional cardiac diagnostics at this time  # Abnormal Lexiscan Myoview Patient has a history of abnormal Lexiscan Myoview in 2021 (anterolateral defect with border zone redistribution, mild LV dilatation during stress and moderately reduced EF of 45-50%).  Most recently in April 2024 with moderate anterior apical hypokinesis with a mixed fixed versus reversible defect, it seems that since the patient was asymptomatic, chest pain-free further invasive cardiac diagnostics were deferred. -As above, defer additional cardiac diagnostics at this time  # Sinus arrhythmia The patient carries a diagnosis of paroxysmal atrial fibrillation in her chart I believe from an EKG obtained on 5/7 which the computer read as atrial fibrillation.  I reviewed this EKG with Dr. Juliann Pares who agrees that the computer read is erroneous, and the rhythm is sinus bradycardia with premature atrial contractions.  She does not have an indication for chronic anticoagulation or a diagnosis of atrial fibrillation based on this EKG tracing.  This patient's plan of care was discussed and created with Dr. Juliann Pares  and he is in agreement.  Signed: Rebeca Allegra , PA-C 03/09/2023, 9:44 AM [facility] Card[facility]

## 2023-03-09 NOTE — Progress Notes (Signed)
Blood pressure worse now than before administration of hydralazine, notified Webb Silversmith NP. Awaiting orders, will continue to monitor.

## 2023-03-09 NOTE — Progress Notes (Signed)
NAME:  Karina Leonard, MRN:  [id number], DOB:  06/15/1941, LOS: 1 ADMISSION DATE:  03/08/2023, CONSULTATION DATE: 03/08/23 REFERRING MD: Dr. Wyn Quaker, CHIEF COMPLAINT: Hypotension    History of Present Illness:  This is an 82 yo female with carotid artery stenosis who presented for an elective left carotid endarterectomy per vascular surgery on 05/16.  However, procedure canceled due to severe hypertension sbp 200's.  Pt subsequently admitted to the stepdown unit per vascular surgery and nitroglycerin gtt initiated for bp control.  Pt also restarted on her outpatient antihypertensives.  However, she remained hypertensive and nicardipine gtt added.  Pt later developed symptomatic hypotension sbp 90's to low 100's.  According to pts daughters pts baseline sbp 140-160's.  PCCM team consulted and levophed gtt initiated.    Pertinent  Medical History  Aortic Atherosclerosis  Atrial Fibrillation with Slow Ventricular Response  Bilateral Carotid Artery Disease  Bradycardia  CAD Degenerative Disc Disease  Depression  Diastolic Dysfunction  HLD  HTN Lipoma  Mild Cognitive Impairment  Pre-Diabetes  Sciatica TIA  Urinary Incontinence   Significant Hospital Events: Including procedures, antibiotic start and stop dates in addition to other pertinent events   05/16: Pt initially admitted to the stepdown unit with hypertensive urgency and later developed symptomatic hypotension secondary to antihypertensive medication requiring levophed gtt PCCM team consulted to assist with management   03/09/23- patient has improved overnight on no infusions now, PCCM will sign off today.  Objective   Blood pressure (!) 180/53, pulse (!) 43, temperature 98.7 F (37.1 C), temperature source Oral, resp. rate (!) 21, height 5\' 7"  (1.702 m), weight 88.1 kg, SpO2 94 %.        Intake/Output Summary (Last 24 hours) at 03/09/2023 0826 Last data filed at 03/09/2023 0600 Gross per 24 hour  Intake 825.86 ml  Output 200 ml   Net 625.86 ml    Filed Weights   03/08/23 0626 03/08/23 0841  Weight: 87.5 kg 88.1 kg    Examination: General: Acutely-ill appearing female,  HENT: Supple, no JVD  Lungs: Clear throughout, even, non labored  Cardiovascular: Junctional rhythm, no m/r/g, 2+ radial/1+ distal pulses, no edema  Abdomen: +BS x4, obese, soft, non tender  Extremities: Normal bulk and tone, moves all extremities  Neuro: Mildly lethargic, oriented, followings commands, PERRLA GU: External catheter draining yellow urine   Resolved Hospital Problem list     Assessment & Plan:  #Hypertension urgency~resolved  #Hypotension secondary to antihypertensive medications  Hx: Bradycardia, HLD, CAD, atrial fibrillation with slow ventricular response  Echo 01/08/23: EF >55%; mild MR/TR; trivial PR; moderate LVH - Continuous telemetry monitoring  - Prn levophed gtt to maintain sbp 140 to 160's - Hold nitroglycerin/nicardipine gtts and outpatient antihypertensives for now - TSH 3.217/Free T4 1.00 - Follow EKG's  - Trend troponin's until peaked  - US Renal Artery Duplex Complete pending  - Cardiology consulted appreciate input  - Continue outpatient asprin and statin   #Bilateral carotid stenosis (50% right common carotid artery stenosis/60% right internal carotid stenosis with a 70% left common carotid artery stenosis) - Vascular surgery consulted appreciate input: once stabilized will proceed with left carotid endarterectomy  #Acute kidney injury likely secondary to hypotension  - Trend BMP  - Replace electrolytes as indicated  - Monitor UOP - Avoid nephrotoxic medications when able   #Chronic diarrhea  #GERD  - Continue PPI - Prn imodium   #Type II diabetes mellitus  - CBG's ac/hs  - SSI  - Hemoglobin A1c pending  Best Practice (right click and "Reselect all SmartList Selections" daily)   Diet/type: Regular consistency (see orders) DVT prophylaxis: LMWH GI prophylaxis: PPI Lines: N/A Foley:   N/A Code Status:  full code Last date of multidisciplinary goals of care discussion [N/A]  05/16: Updated pt and pts daughter's at bedside regarding pts condition and current plan of care.  All questions were answered  Labs   CBC: Recent Labs  Lab 03/08/23 1043 03/08/23 1437 03/09/23 0406  WBC 13.2* 19.6* 20.6*  NEUTROABS  --  17.4*  --   HGB 11.4* 11.9* 11.9*  HCT 34.4* 36.0 35.5*  MCV 91.7 92.1 92.2  PLT 199 172 204     Basic Metabolic Panel: Recent Labs  Lab 03/08/23 1436 03/08/23 1437 03/09/23 0406  NA  --  137 138  K  --  4.4 4.5  CL  --  108 107  CO2  --  16* 23  GLUCOSE  --  306* 125*  BUN  --  28* 38*  CREATININE  --  1.74* 2.38*  CALCIUM  --  7.9* 8.2*  MG  --   --  1.6*  PHOS 4.9*  --  3.2    GFR: Estimated Creatinine Clearance: 21.5 mL/min (A) (by C-G formula based on SCr of 2.38 mg/dL (H)). Recent Labs  Lab 03/08/23 1043 03/08/23 1437 03/09/23 0406  WBC 13.2* 19.6* 20.6*     Liver Function Tests: Recent Labs  Lab 03/08/23 1437 03/09/23 0406  AST 48* 82*  ALT 15 37  ALKPHOS 77 84  BILITOT 1.0 0.9  PROT 5.9* 6.5  ALBUMIN 3.1* 3.4*    No results for input(s): "LIPASE", "AMYLASE" in the last 168 hours. No results for input(s): "AMMONIA" in the last 168 hours.  ABG No results found for: "PHART", "PCO2ART", "PO2ART", "HCO3", "TCO2", "ACIDBASEDEF", "O2SAT"   Coagulation Profile: No results for input(s): "INR", "PROTIME" in the last 168 hours.  Cardiac Enzymes: No results for input(s): "CKTOTAL", "CKMB", "CKMBINDEX", "TROPONINI" in the last 168 hours.  HbA1C: Hgb A1c MFr Bld  Date/Time Value Ref Range Status  03/08/2023 05:56 PM 5.7 (H) 4.8 - 5.6 % Final    Comment:    (NOTE) Pre diabetes:          5.7%-6.4%  Diabetes:              >6.4%  Glycemic control for   <7.0% adults with diabetes   02/02/2023 11:14 AM 6.0 (H) 4.8 - 5.6 % Final    Comment:             Prediabetes: 5.7 - 6.4          Diabetes: >6.4          Glycemic  control for adults with diabetes: <7.0     CBG: Recent Labs  Lab 03/08/23 0839 03/08/23 1355 03/08/23 1724 03/08/23 2130 03/09/23 0725  GLUCAP 75 203* 178* 96 103*     Review of Systems: Positives in BOLD   Gen: fever, chills, weight change, fatigue, night sweats HEENT: Denies blurred vision, double vision, hearing loss, tinnitus, sinus congestion, rhinorrhea, sore throat, neck stiffness, dysphagia PULM: shortness of breath, cough, sputum production, hemoptysis, wheezing CV: Denies chest pain, edema, orthopnea, paroxysmal nocturnal dyspnea, palpitations GI: Denies abdominal pain, nausea, vomiting, diarrhea, hematochezia, melena, constipation, change in bowel habits GU: Denies dysuria, hematuria, polyuria, oliguria, urethral discharge Endocrine: Denies hot or cold intolerance, polyuria, polyphagia or appetite change Derm: Denies rash, dry skin, scaling or peeling skin change Heme:  Denies easy bruising, bleeding, bleeding gums Neuro: dizziness, headache, numbness, weakness, slurred speech, loss of memory or consciousness   Past Medical History:  She,  has a past medical history of Allergy, Aortic atherosclerosis (HCC), Atrial fibrillation with slow ventricular response (HCC) (02/27/2023), Bilateral carotid artery disease (HCC), Bradycardia, Cataract cortical, senile, Cerebrovascular disease, Coronary artery disease involving native coronary artery of native heart without angina pectoris, DDD (degenerative disc disease), lumbar, Depression, Diastolic dysfunction, Full dentures, Hyperlipidemia, Hypertension, Keloid, Lipoma, Long term current use of antithrombotics/antiplatelets, Mild cognitive impairment, Pre-diabetes, Sciatica, Stroke (HCC), TIA (transient ischemic attack), and Urinary incontinence.   Surgical History:   Past Surgical History:  Procedure Laterality Date   CHOLECYSTECTOMY  2009   COLONOSCOPY     RECTAL POLYPECTOMY     benign     Social History:   reports that  she quit smoking about 57 years ago. Her smoking use included cigarettes. She has a 20.00 pack-year smoking history. She has never used smokeless tobacco. She reports current alcohol use. She reports that she does not use drugs.   Family History:  Her family history includes Cancer in her father and mother; Heart attack in her sister.   Allergies Allergies  Allergen Reactions   Amoxicillin Hives   Donepezil Diarrhea   Nicardipine Hcl In Dextrose Other (See Comments)    Patient vasovagal response on low dose cardene   Penicillins Hives     Home Medications  Prior to Admission medications   Medication Sig Start Date End Date Taking? Authorizing Provider  aspirin EC 81 MG tablet Take 81 mg by mouth daily. Take while off of the plavix for surgey   Yes [provider]  cloNIDine (CATAPRES - DOSED IN MG/24 HR) 0.2 mg/24hr patch Place 0.2 mg onto the skin once a week. Sunday   Yes [provider]  fexofenadine (ALLEGRA) 180 MG tablet Take 1 tablet (180 mg total) by mouth daily. otc 07/22/18  Yes Duanne Limerick, MD  fluticasone (FLONASE) 50 MCG/ACT nasal spray Place 1 spray into both nostrils daily. otc   Yes [provider]  furosemide (LASIX) 20 MG tablet Take 20 mg by mouth daily. [place] 01/18/23  Yes [provider]  losartan-hydrochlorothiazide (HYZAAR) 100-25 MG tablet Take 1 tablet by mouth daily. 02/02/23  Yes Duanne Limerick, MD  lovastatin (MEVACOR) 20 MG tablet TAKE 2 TABLETS EVERY DAY Patient taking differently: Take 2 tablets by mouth at bedtime. TAKE 2 TABLETS EVERY DAY 02/02/23  Yes Duanne Limerick, MD  metoprolol succinate (TOPROL-XL) 50 MG 24 hr tablet Take 1 tablet (50 mg total) by mouth daily. [place] 05/25/22  Yes Duanne Limerick, MD  venlafaxine XR (EFFEXOR-XR) 75 MG 24 hr capsule Take 1 capsule (75 mg total) by mouth daily. Patient taking differently: Take 75 mg by mouth at bedtime. 02/02/23  Yes Duanne Limerick, MD  clopidogrel (PLAVIX) 75  MG tablet Take 1 tablet (75 mg total) by mouth daily. 02/02/23   Duanne Limerick, MD     Critical care provider statement:   Total critical care time: 33 minutes   Performed by: Karna Christmas MD   Critical care time was exclusive of separately billable procedures and treating other patients.   Critical care was necessary to treat or prevent imminent or life-threatening deterioration.   Critical care was time spent personally by me on the following activities: development of treatment plan with patient and/or surrogate as well as nursing, discussions with consultants, evaluation of patient's response to  treatment, examination of patient, obtaining history from patient or surrogate, ordering and performing treatments and interventions, ordering and review of laboratory studies, ordering and review of radiographic studies, pulse oximetry and re-evaluation of patient's condition.    Vida Rigger, M.D.  Pulmonary & Critical Care Medicine

## 2023-03-09 NOTE — Progress Notes (Signed)
Spoke with Dr. Evie Lacks, regarding blood pressure, she asked that I reach out to the ICU provider, spoke with Webb Silversmith NP about blood pressure and concerns regarding diastolic. Rechecked blood pressure after administration of 20 mg of hydralizine, no appreciable change in blood pressure. She is going to look into history and present chart and determine next step. Will continue to monitor

## 2023-03-09 NOTE — Progress Notes (Signed)
Progress Note    03/09/2023 7:43 AM 1 Day Post-Op  Subjective:  This is an 82 yo female with carotid artery stenosis who presented for an elective left carotid endarterectomy with vascular surgery on 05/16. However, procedure canceled due to severe hypertension sbp 200's. Pt subsequently admitted to the stepdown unit per vascular surgery and nitroglycerin gtt initiated for bp control.  Patient was restarted on her outpatient antihypertensive medication and then subsequently was started on a nicardipine infusion.  Patient later in the day developed symptomatic hypotension with systolic blood pressures in the 90s.  Patient became diaphoretic and sweaty and ill feeling at the time.  Patient was then started on Levophed at 5 mgs for the hypotension.  Patient's blood pressure then circled back to her baseline of a systolic blood pressure 140-160.  Patient then felt much better easily conversing and following commands.  This morning on exam the patient is resting comfortably in bed in ICU.  One of her daughters is at the bedside.  Patient's blood pressure is noted to be systolically 170-180 and diastolically 70-80.  Rate remains in the 40s with bradycardia.  Patient endorses she feels much better this morning.  She has not taken her morning antihypertensive medications.  May explain her elevated blood pressure this morning.  Cording to cardiology's note beta-blockers and calcium channel blockers have been placed on hold at this point in time due to the patient's elevated blood pressure and bradycardia.  Patient is alert and oriented x 3 and conversing well.  She denies any nausea vomiting or dizziness.  She answers all my questions appropriately and follows commands.  No complaints overnight vitals remained stable.     Vitals:   03/09/23 0645 03/09/23 0700  BP: (!) 169/93 (!) 180/53  Pulse: (!) 39 (!) 43  Resp: (!) 22 (!) 21  Temp:    SpO2: 95% 94%   Physical Exam: Cardiac:  Irregular rhythm Hx of  Atrial Fibrillation, Bradycardia Lungs:  Clear on Auscultation throughout Incisions:  None Extremities:  Palpable pulses throughout Abdomen:  Positive Bowel Sounds, soft, non tender and non distended Neurologic: AAOX3, follows commands.   CBC    Component Value Date/Time   WBC 20.6 (H) 03/09/2023 0406   RBC 3.85 (L) 03/09/2023 0406   HGB 11.9 (L) 03/09/2023 0406   HGB 12.5 01/31/2022 1054   HCT 35.5 (L) 03/09/2023 0406   HCT 37.8 01/31/2022 1054   PLT 204 03/09/2023 0406   PLT 214 01/31/2022 1054   MCV 92.2 03/09/2023 0406   MCV 92 01/31/2022 1054   MCV 96 11/03/2014 1425   MCH 30.9 03/09/2023 0406   MCHC 33.5 03/09/2023 0406   RDW 13.9 03/09/2023 0406   RDW 12.6 01/31/2022 1054   RDW 13.7 11/03/2014 1425   LYMPHSABS 1.7 03/08/2023 1437   LYMPHSABS 2.0 01/31/2022 1054   LYMPHSABS 2.3 11/03/2014 1425   MONOABS 0.3 03/08/2023 1437   MONOABS 0.6 11/03/2014 1425   EOSABS 0.1 03/08/2023 1437   EOSABS 0.2 01/31/2022 1054   EOSABS 0.1 11/03/2014 1425   BASOSABS 0.1 03/08/2023 1437   BASOSABS 0.1 01/31/2022 1054   BASOSABS 0.1 11/03/2014 1425    BMET    Component Value Date/Time   NA 138 03/09/2023 0406   NA 143 02/02/2023 1114   K 4.5 03/09/2023 0406   CL 107 03/09/2023 0406   CO2 23 03/09/2023 0406   GLUCOSE 125 (H) 03/09/2023 0406   BUN 38 (H) 03/09/2023 0406   BUN 16 02/02/2023 1114  CREATININE 2.38 (H) 03/09/2023 0406   CALCIUM 8.2 (L) 03/09/2023 0406   GFRNONAA 20 (L) 03/09/2023 0406   GFRAA 60 01/22/2020 1140    INR No results found for: "INR"   Intake/Output Summary (Last 24 hours) at 03/09/2023 0743 Last data filed at 03/09/2023 0600 Gross per 24 hour  Intake 825.86 ml  Output 200 ml  Net 625.86 ml     Assessment/Plan:  82 y.o. female is s/p malignant hypertension with cancellation of a carotid endarterectomy surgery.  1 Day Post-Op   PLAN: Recommendations from cardiology this morning patient will be given her oral antihypertensives minus  beta-blockers and calcium channel blockers.  Patient is no longer on any blood pressure control via IV infusion.  Will continue to monitor blood pressures throughout the day to determine best medication regime for her hypertension. At this time vascular surgery does not have plans for either a surgical intervention or a procedural intervention. We will advance patient's diet to a regular diet and ambulate patient to the bedside chair.  DVT prophylaxis: Lovenox 30 mg every 24 hours SQ   Peggye Ley Hollie Bartus [facility] 03/09/2023 7:43 AM

## 2023-03-10 DIAGNOSIS — I2510 Atherosclerotic heart disease of native coronary artery without angina pectoris: Secondary | ICD-10-CM | POA: Diagnosis not present

## 2023-03-10 DIAGNOSIS — I6529 Occlusion and stenosis of unspecified carotid artery: Secondary | ICD-10-CM | POA: Diagnosis not present

## 2023-03-10 LAB — GLUCOSE, CAPILLARY
Glucose-Capillary: 122 mg/dL — ABNORMAL HIGH (ref 70–99)
Glucose-Capillary: 156 mg/dL — ABNORMAL HIGH (ref 70–99)
Glucose-Capillary: 121 mg/dL — ABNORMAL HIGH (ref 70–99)
Glucose-Capillary: 119 mg/dL — ABNORMAL HIGH (ref 70–99)
Glucose-Capillary: 130 mg/dL — ABNORMAL HIGH (ref 70–99)

## 2023-03-10 LAB — BASIC METABOLIC PANEL
Anion gap: 9 (ref 5–15)
BUN: 50 mg/dL — ABNORMAL HIGH (ref 8–23)
CO2: 21 mmol/L — ABNORMAL LOW (ref 22–32)
Calcium: 8.5 mg/dL — ABNORMAL LOW (ref 8.9–10.3)
Chloride: 106 mmol/L (ref 98–111)
Creatinine, Ser: 2.38 mg/dL — ABNORMAL HIGH (ref 0.44–1.00)
GFR, Estimated: 20 mL/min — ABNORMAL LOW (ref >60)
Glucose, Bld: 121 mg/dL — ABNORMAL HIGH (ref 70–99)
Potassium: 3.8 mmol/L (ref 3.5–5.1)
Sodium: 136 mmol/L (ref 135–145)

## 2023-03-10 MED ORDER — NOREPINEPHRINE 4 MG/250ML-% IV SOLN
INTRAVENOUS | Status: AC
  Filled 2023-03-10: qty 250

## 2023-03-10 MED ORDER — CLEVIDIPINE BUTYRATE 0.5 MG/ML IV EMUL
0.5000 mg/h | INTRAVENOUS | Status: DC
  Filled 2023-03-10: qty 100

## 2023-03-10 MED ORDER — HYDROCHLOROTHIAZIDE 50 MG PO TABS
50.0000 mg | ORAL_TABLET | Freq: Every day | ORAL | Status: DC
  Filled 2023-03-10: qty 1

## 2023-03-10 MED ORDER — HYDROCHLOROTHIAZIDE 25 MG PO TABS
25.0000 mg | ORAL_TABLET | ORAL | Status: AC
  Administered 2023-03-10: 25 mg via ORAL
  Filled 2023-03-10: qty 1

## 2023-03-10 MED ORDER — OXYCODONE HCL 5 MG PO TABS
5.0000 mg | ORAL_TABLET | Freq: Four times a day (QID) | ORAL | Status: DC | PRN
  Administered 2023-03-10 (×2): 5 mg via ORAL
  Filled 2023-03-10 (×2): qty 1

## 2023-03-10 MED ORDER — CLEVIDIPINE BUTYRATE 0.5 MG/ML IV EMUL: 0.0000 mg/h | INTRAVENOUS | Status: DC

## 2023-03-10 MED ORDER — OXYCODONE HCL 5 MG PO TABS
5.0000 mg | ORAL_TABLET | ORAL | Status: DC | PRN
  Administered 2023-03-10 – 2023-03-12 (×4): 5 mg via ORAL
  Filled 2023-03-10 (×4): qty 1

## 2023-03-10 MED ORDER — AMLODIPINE BESYLATE 5 MG PO TABS
5.0000 mg | ORAL_TABLET | Freq: Two times a day (BID) | ORAL | Status: DC
  Administered 2023-03-10 – 2023-03-12 (×5): 5 mg via ORAL
  Filled 2023-03-10 (×5): qty 1

## 2023-03-10 MED ORDER — CLEVIDIPINE BUTYRATE 0.5 MG/ML IV EMUL
0.0000 mg/h | INTRAVENOUS | Status: DC
  Administered 2023-03-10: 8 mg/h via INTRAVENOUS
  Administered 2023-03-10 – 2023-03-11 (×2): 10 mg/h via INTRAVENOUS
  Administered 2023-03-11: 8 mg/h via INTRAVENOUS
  Administered 2023-03-11: 6 mg/h via INTRAVENOUS
  Administered 2023-03-11: 12 mg/h via INTRAVENOUS
  Administered 2023-03-12: 4 mg/h via INTRAVENOUS
  Filled 2023-03-10 (×6): qty 100

## 2023-03-10 NOTE — Progress Notes (Signed)
2 Days Post-Op   Subjective/Chief Complaint: Continued hypertension overnight- now on Cleviprex with better control.   Objective: Vital signs in last 24 hours: Temp:  [98.2 F (36.8 C)-98.3 F (36.8 C)] 98.3 F (36.8 C) (05/18 0400) Pulse Rate:  [40-57] 52 (05/18 0930) Resp:  [13-25] 18 (05/18 0930) BP: (86-234)/(34-179) 181/41 (05/18 0930) SpO2:  [87 %-99 %] 95 % (05/18 0930) Last BM Date : 03/09/23  Intake/Output from previous day: 05/17 0701 - 05/18 0700 In: 405.5 [P.O.:240; I.V.:115.5; IV Piggyback:50.1] Out: 475 [Urine:475] Intake/Output this shift: Total I/O In: 39.6 [I.V.:39.6] Out: -   General appearance: alert and no distress Cardio: regular rate and rhythm Extremities: extremities normal, atraumatic, no cyanosis or edema Neurologic: Grossly normal  Lab Results:  Recent Labs    03/08/23 1437 03/09/23 0406  WBC 19.6* 20.6*  HGB 11.9* 11.9*  HCT 36.0 35.5*  PLT 172 204   BMET Recent Labs    03/09/23 0406 03/10/23 0453  NA 138 136  K 4.5 3.8  CL 107 106  CO2 23 21*  GLUCOSE 125* 121*  BUN 38* 50*  CREATININE 2.38* 2.38*  CALCIUM 8.2* 8.5*   PT/INR No results for input(s): "LABPROT", "INR" in the last 72 hours. ABG No results for input(s): "PHART", "HCO3" in the last 72 hours.  Invalid input(s): "PCO2", "PO2"  Studies/Results: US RENAL ARTERY DUPLEX LIMITED  Result Date: 03/08/2023 CLINICAL DATA:  Malignant hypertension EXAM: RENAL/URINARY TRACT ULTRASOUND RENAL DUPLEX DOPPLER ULTRASOUND COMPARISON:  CT 04/05/2008 FINDINGS: Right Kidney: Length: 9.8 x 4.8 x 5.3 cm (128 cc). Isoechoic to adjacent liver. 1.6 x 1.3 x 1.2 cm midpole cyst. No hydronephrosis. Left Kidney: Length: 10.6 x 5.5 x 4.7 cm (144 cc). No hydronephrosis. 2.3 x 2 cm lower pole cortical cyst. Bladder:  Not imaged RENAL DUPLEX ULTRASOUND Right Renal Artery Velocities: Origin:  43.8 cm/sec Mid:  47.6 cm/sec [place]:  43.2 cm/sec Left Renal Artery Velocities: Origin:  70.2 cm/sec Mid:   67.8 cm/sec [place]:  67.7 cm/sec Aortic Velocity:  141 cm/sec All renal-aortic ratios are well under 1.0. Technologist describes technically difficult study secondary to bowel gas and patient inability to hold breath. IMPRESSION: 1. No Doppler ultrasound evidence of hemodynamically significant renal artery stenosis. 2. If there is continued clinical concern, renal MRA (lower radiation risk, can be performed noncontrast in the setting of renal dysfunction) and CTA ( higher spatial resolution) represent more accurate studies, which are additionally more sensitive to the detection of duplicated renal arteries. Electronically Signed   By: Corlis Leak M.D.   On: 03/08/2023 18:26   DG Chest Port 1 View  Result Date: 03/08/2023 CLINICAL DATA:  Hypotension EXAM: PORTABLE CHEST 1 VIEW COMPARISON:  08/22/2007 FINDINGS: Single frontal view of the chest demonstrates an enlarged cardiac silhouette. There is atherosclerosis of the thoracic aorta, bilateral subclavian arteries, and bilateral carotid arteries. No acute airspace disease, effusion, or pneumothorax. No acute bony abnormalities. IMPRESSION: 1. Enlarged cardiac silhouette. 2. No acute airspace disease. 3. Extensive atherosclerosis. Electronically Signed   By: Sharlet Salina M.D.   On: 03/08/2023 14:53    Anti-infectives: Anti-infectives (From admission, onward)    Start     Dose/Rate Route Frequency Ordered Stop   03/08/23 0600  vancomycin (VANCOCIN) IVPB 1000 mg/200 mL premix        1,000 mg 200 mL/hr over 60 Minutes Intravenous On call to O.R. 03/08/23 0018 03/08/23 0855       Assessment/Plan:  Malignant HTN- Continue Cleviprex- appreciate Intensivist and Cardiology  input and care  Continue to monitor in ICU  LOS: 2 days    Eli Hose A 03/10/2023

## 2023-03-10 NOTE — Progress Notes (Signed)
Spoke with daughter who was at bedside during the night; she has requested that patient not be awakened this am for breakfast unless necessary due to her "bad night". Pt does appear to be sleeping peacefully. BP being checked every 30 minutes on auto BP cuff. Monitor shows junctional rhythm in 50's, with occ ectopic beats. Notified by CCMD that patient had 6 beat run of wide complex rhythm. Cleviprex infusing at 10 mg per hour.

## 2023-03-10 NOTE — Progress Notes (Addendum)
NAME:  Karina Leonard, MRN:  [id number], DOB:  1941-05-28, LOS: 2 ADMISSION DATE:  03/08/2023, CONSULTATION DATE: 03/08/23 REFERRING MD: Dr. Wyn Quaker, CHIEF COMPLAINT: Hypotension    History of Present Illness:  This is an 82 yo female with carotid artery stenosis who presented for an elective left carotid endarterectomy per vascular surgery on 05/16.  However, procedure canceled due to severe hypertension sbp 200's.  Pt subsequently admitted to the stepdown unit per vascular surgery and nitroglycerin gtt initiated for bp control.  Pt also restarted on her outpatient antihypertensives.  However, she remained hypertensive and nicardipine gtt added.  Pt later developed symptomatic hypotension sbp 90's to low 100's.  According to pts daughters pts baseline sbp 140-160's.  PCCM team consulted and levophed gtt initiated.    03/10/23- patient had another episode of accelerated hypertension    - we have reviewed medications , cleviprex gtt started overnight. Patient seems to have developed hydralazine tolerance with rebound effect. Have increased HCTZ to 50 from 25.  Have met with cardiology - Dr Juliann Pares , amlodipine 5 bid has been initiated. Will consider additional therapy as needed while weaning down cleviprex gtt.  Her Chronic renal impairment seems to have acute component with oliguric aki, will get nephrology to evaluate.   Pertinent  Medical History  Aortic Atherosclerosis  Atrial Fibrillation with Slow Ventricular Response  Bilateral Carotid Artery Disease  Bradycardia  CAD Degenerative Disc Disease  Depression  Diastolic Dysfunction  HLD  HTN Lipoma  Mild Cognitive Impairment  Pre-Diabetes  Sciatica TIA  Urinary Incontinence   Significant Hospital Events: Including procedures, antibiotic start and stop dates in addition to other pertinent events   05/16: Pt initially admitted to the stepdown unit with hypertensive urgency and later developed symptomatic hypotension secondary to  antihypertensive medication requiring levophed gtt PCCM team consulted to assist with management    Objective   Blood pressure (!) 180/40, pulse (!) 54, temperature 98.3 F (36.8 C), temperature source Oral, resp. rate 18, height 5\' 7"  (1.702 m), weight 88.1 kg, SpO2 94 %.        Intake/Output Summary (Last 24 hours) at 03/10/2023 4098 Last data filed at 03/10/2023 0800 Gross per 24 hour  Intake 425.5 ml  Output 475 ml  Net -49.5 ml    Filed Weights   03/08/23 0626 03/08/23 0841  Weight: 87.5 kg 88.1 kg    Examination: General: Acutely-ill appearing female,  HENT: Supple, no JVD  Lungs: Clear throughout, even, non labored  Cardiovascular: Junctional rhythm, no m/r/g, 2+ radial/1+ distal pulses, no edema  Abdomen: +BS x4, obese, soft, non tender  Extremities: Normal bulk and tone, moves all extremities  Neuro: Mildly lethargic, oriented, followings commands, PERRLA GU: External catheter draining yellow urine   Resolved Hospital Problem list     Assessment & Plan:  #Hypertension urgency~resolved  #Hypotension secondary to antihypertensive medications  Hx: Bradycardia, HLD, CAD, atrial fibrillation with slow ventricular response  Echo 01/08/23: EF >55%; mild MR/TR; trivial PR; moderate LVH - Continuous telemetry monitoring  - Prn levophed gtt to maintain sbp 140 to 160's - Hold nitroglycerin/nicardipine gtts and outpatient antihypertensives for now - TSH 3.217/Free T4 1.00 - Follow EKG's  - Trend troponin's until peaked  - US Renal Artery Duplex Complete pending  - Cardiology consulted appreciate input  - Continue outpatient asprin and statin   #Bilateral carotid stenosis (50% right common carotid artery stenosis/60% right internal carotid stenosis with a 70% left common carotid artery stenosis) - Vascular surgery consulted  appreciate input: once stabilized will proceed with left carotid endarterectomy  #Acute kidney injury on Chronic hypertensive nephropathy -  likely  secondary to hypotension  - Trend BMP  - Replace electrolytes as indicated  - Monitor UOP - Avoid nephrotoxic medications when able  -nephrology consultation  #Chronic diarrhea  #GERD  - Continue PPI - Prn imodium   #Type II diabetes mellitus  - CBG's ac/hs  - SSI  - Hemoglobin A1c pending   Best Practice (right click and "Reselect all SmartList Selections" daily)   Diet/type: Regular consistency (see orders) DVT prophylaxis: LMWH GI prophylaxis: PPI Lines: N/A Foley:  N/A Code Status:  full code Last date of multidisciplinary goals of care discussion [N/A]  05/16: Updated pt and pts daughter's at bedside regarding pts condition and current plan of care.  All questions were answered  Labs   CBC: Recent Labs  Lab 03/08/23 1043 03/08/23 1437 03/09/23 0406  WBC 13.2* 19.6* 20.6*  NEUTROABS  --  17.4*  --   HGB 11.4* 11.9* 11.9*  HCT 34.4* 36.0 35.5*  MCV 91.7 92.1 92.2  PLT 199 172 204     Basic Metabolic Panel: Recent Labs  Lab 03/08/23 1436 03/08/23 1437 03/09/23 0406 03/10/23 0453  NA  --  137 138 136  K  --  4.4 4.5 3.8  CL  --  108 107 106  CO2  --  16* 23 21*  GLUCOSE  --  306* 125* 121*  BUN  --  28* 38* 50*  CREATININE  --  1.74* 2.38* 2.38*  CALCIUM  --  7.9* 8.2* 8.5*  MG  --   --  1.6*  --   PHOS 4.9*  --  3.2  --     GFR: Estimated Creatinine Clearance: 21.5 mL/min (A) (by C-G formula based on SCr of 2.38 mg/dL (H)). Recent Labs  Lab 03/08/23 1043 03/08/23 1437 03/09/23 0406  WBC 13.2* 19.6* 20.6*     Liver Function Tests: Recent Labs  Lab 03/08/23 1437 03/09/23 0406  AST 48* 82*  ALT 15 37  ALKPHOS 77 84  BILITOT 1.0 0.9  PROT 5.9* 6.5  ALBUMIN 3.1* 3.4*    No results for input(s): "LIPASE", "AMYLASE" in the last 168 hours. No results for input(s): "AMMONIA" in the last 168 hours.  ABG No results found for: "PHART", "PCO2ART", "PO2ART", "HCO3", "TCO2", "ACIDBASEDEF", "O2SAT"   Coagulation Profile: No results for  input(s): "INR", "PROTIME" in the last 168 hours.  Cardiac Enzymes: No results for input(s): "CKTOTAL", "CKMB", "CKMBINDEX", "TROPONINI" in the last 168 hours.  HbA1C: Hgb A1c MFr Bld  Date/Time Value Ref Range Status  03/08/2023 05:56 PM 5.7 (H) 4.8 - 5.6 % Final    Comment:    (NOTE) Pre diabetes:          5.7%-6.4%  Diabetes:              >6.4%  Glycemic control for   <7.0% adults with diabetes   02/02/2023 11:14 AM 6.0 (H) 4.8 - 5.6 % Final    Comment:             Prediabetes: 5.7 - 6.4          Diabetes: >6.4          Glycemic control for adults with diabetes: <7.0     CBG: Recent Labs  Lab 03/09/23 0725 03/09/23 1132 03/09/23 1617 03/09/23 2127 03/10/23 0733  GLUCAP 103* 116* 119* 98 122*     Review  of Systems: Positives in BOLD   Gen: fever, chills, weight change, fatigue, night sweats HEENT: Denies blurred vision, double vision, hearing loss, tinnitus, sinus congestion, rhinorrhea, sore throat, neck stiffness, dysphagia PULM: shortness of breath, cough, sputum production, hemoptysis, wheezing CV: Denies chest pain, edema, orthopnea, paroxysmal nocturnal dyspnea, palpitations GI: Denies abdominal pain, nausea, vomiting, diarrhea, hematochezia, melena, constipation, change in bowel habits GU: Denies dysuria, hematuria, polyuria, oliguria, urethral discharge Endocrine: Denies hot or cold intolerance, polyuria, polyphagia or appetite change Derm: Denies rash, dry skin, scaling or peeling skin change Heme: Denies easy bruising, bleeding, bleeding gums Neuro: dizziness, headache, numbness, weakness, slurred speech, loss of memory or consciousness   Past Medical History:  She,  has a past medical history of Allergy, Aortic atherosclerosis (HCC), Atrial fibrillation with slow ventricular response (HCC) (02/27/2023), Bilateral carotid artery disease (HCC), Bradycardia, Cataract cortical, senile, Cerebrovascular disease, Coronary artery disease involving native  coronary artery of native heart without angina pectoris, DDD (degenerative disc disease), lumbar, Depression, Diastolic dysfunction, Full dentures, Hyperlipidemia, Hypertension, Keloid, Lipoma, Long term current use of antithrombotics/antiplatelets, Mild cognitive impairment, Pre-diabetes, Sciatica, Stroke (HCC), TIA (transient ischemic attack), and Urinary incontinence.   Surgical History:   Past Surgical History:  Procedure Laterality Date   CHOLECYSTECTOMY  2009   COLONOSCOPY     RECTAL POLYPECTOMY     benign     Social History:   reports that she quit smoking about 57 years ago. Her smoking use included cigarettes. She has a 20.00 pack-year smoking history. She has never used smokeless tobacco. She reports current alcohol use. She reports that she does not use drugs.   Family History:  Her family history includes Cancer in her father and mother; Heart attack in her sister.   Allergies Allergies  Allergen Reactions   Amoxicillin Hives   Donepezil Diarrhea   Nicardipine Hcl In Dextrose Other (See Comments)    Patient vasovagal response on low dose cardene   Penicillins Hives     Home Medications  Prior to Admission medications   Medication Sig Start Date End Date Taking? Authorizing Provider  aspirin EC 81 MG tablet Take 81 mg by mouth daily. Take while off of the plavix for surgey   Yes [provider]  cloNIDine (CATAPRES - DOSED IN MG/24 HR) 0.2 mg/24hr patch Place 0.2 mg onto the skin once a week. Sunday   Yes [provider]  fexofenadine (ALLEGRA) 180 MG tablet Take 1 tablet (180 mg total) by mouth daily. otc 07/22/18  Yes Duanne Limerick, MD  fluticasone (FLONASE) 50 MCG/ACT nasal spray Place 1 spray into both nostrils daily. otc   Yes [provider]  furosemide (LASIX) 20 MG tablet Take 20 mg by mouth daily. [place] 01/18/23  Yes [provider]  losartan-hydrochlorothiazide (HYZAAR) 100-25 MG tablet Take 1 tablet by mouth daily.  02/02/23  Yes Duanne Limerick, MD  lovastatin (MEVACOR) 20 MG tablet TAKE 2 TABLETS EVERY DAY Patient taking differently: Take 2 tablets by mouth at bedtime. TAKE 2 TABLETS EVERY DAY 02/02/23  Yes Duanne Limerick, MD  metoprolol succinate (TOPROL-XL) 50 MG 24 hr tablet Take 1 tablet (50 mg total) by mouth daily. [place] 05/25/22  Yes Duanne Limerick, MD  venlafaxine XR (EFFEXOR-XR) 75 MG 24 hr capsule Take 1 capsule (75 mg total) by mouth daily. Patient taking differently: Take 75 mg by mouth at bedtime. 02/02/23  Yes Duanne Limerick, MD  clopidogrel (PLAVIX) 75 MG tablet Take 1 tablet (75 mg total)  by mouth daily. 02/02/23   Duanne Limerick, MD     Critical care provider statement:   Total critical care time: 33 minutes   Performed by: Karna Christmas MD   Critical care time was exclusive of separately billable procedures and treating other patients.   Critical care was necessary to treat or prevent imminent or life-threatening deterioration.   Critical care was time spent personally by me on the following activities: development of treatment plan with patient and/or surrogate as well as nursing, discussions with consultants, evaluation of patient's response to treatment, examination of patient, obtaining history from patient or surrogate, ordering and performing treatments and interventions, ordering and review of laboratory studies, ordering and review of radiographic studies, pulse oximetry and re-evaluation of patient's condition.    Vida Rigger, M.D.  Pulmonary & Critical Care Medicine

## 2023-03-10 NOTE — Progress Notes (Signed)
[facility] Cardiology    SUBJECTIVE: Patient resting comfortably in bed complains of right arm and hand numbness some pain she says she has a hand lipoma which has been evaluated by orthopedics in the past and needs an MRI for consideration for corrective therapy otherwise the patient feels reasonably well no chest pain no headaches no vertigo   Vitals:   03/10/23 0819 03/10/23 0830 03/10/23 0900 03/10/23 0930  BP:  (!) 180/40 (!) 164/40 (!) 181/41  Pulse: (!) 54 (!) 54 (!) 54 (!) 52  Resp: 19 18 19 18   Temp:      TempSrc:      SpO2: 93% 94% 94% 95%  Weight:      Height:         Intake/Output Summary (Last 24 hours) at 03/10/2023 1003 Last data filed at 03/10/2023 0900 Gross per 24 hour  Intake 299.39 ml  Output 475 ml  Net -175.61 ml      PHYSICAL EXAM  General: Well developed, well nourished, in no acute distress HEENT:  Normocephalic and atramatic Neck:  No JVD.  Lungs: Clear bilaterally to auscultation and percussion. Heart: Bradycardia. Normal S1 and S2 without gallops or murmurs.  Abdomen: Bowel sounds are positive, abdomen soft and non-tender  Msk:  Back normal, normal gait. Normal strength and tone for age. Extremities: No clubbing, cyanosis or edema.   Neuro: Alert and oriented X 3. Psych:  Good affect, responds appropriately   LABS: Basic Metabolic Panel: Recent Labs    03/08/23 1436 03/08/23 1437 03/09/23 0406 03/10/23 0453  NA  --    < > 138 136  K  --    < > 4.5 3.8  CL  --    < > 107 106  CO2  --    < > 23 21*  GLUCOSE  --    < > 125* 121*  BUN  --    < > 38* 50*  CREATININE  --    < > 2.38* 2.38*  CALCIUM  --    < > 8.2* 8.5*  MG  --   --  1.6*  --   PHOS 4.9*  --  3.2  --    < > = values in this interval not displayed.   Liver Function Tests: Recent Labs    03/08/23 1437 03/09/23 0406  AST 48* 82*  ALT 15 37  ALKPHOS 77 84  BILITOT 1.0 0.9  PROT 5.9* 6.5  ALBUMIN 3.1* 3.4*   No results for input(s): "LIPASE", "AMYLASE" in the last 72  hours. CBC: Recent Labs    03/08/23 1437 03/09/23 0406  WBC 19.6* 20.6*  NEUTROABS 17.4*  --   HGB 11.9* 11.9*  HCT 36.0 35.5*  MCV 92.1 92.2  PLT 172 204   Cardiac Enzymes: No results for input(s): "CKTOTAL", "CKMB", "CKMBINDEX", "TROPONINI" in the last 72 hours. BNP: Invalid input(s): "POCBNP" D-Dimer: No results for input(s): "DDIMER" in the last 72 hours. Hemoglobin A1C: Recent Labs    03/08/23 1756  HGBA1C 5.7*   Fasting Lipid Panel: No results for input(s): "CHOL", "HDL", "LDLCALC", "TRIG", "CHOLHDL", "LDLDIRECT" in the last 72 hours. Thyroid Function Tests: Recent Labs    03/08/23 1436  TSH 3.217   Anemia Panel: No results for input(s): "VITAMINB12", "FOLATE", "FERRITIN", "TIBC", "IRON", "RETICCTPCT" in the last 72 hours.  US RENAL ARTERY DUPLEX LIMITED  Result Date: 03/08/2023 CLINICAL DATA:  Malignant hypertension EXAM: RENAL/URINARY TRACT ULTRASOUND RENAL DUPLEX DOPPLER ULTRASOUND COMPARISON:  CT 04/05/2008 FINDINGS: Right  Kidney: Length: 9.8 x 4.8 x 5.3 cm (128 cc). Isoechoic to adjacent liver. 1.6 x 1.3 x 1.2 cm midpole cyst. No hydronephrosis. Left Kidney: Length: 10.6 x 5.5 x 4.7 cm (144 cc). No hydronephrosis. 2.3 x 2 cm lower pole cortical cyst. Bladder:  Not imaged RENAL DUPLEX ULTRASOUND Right Renal Artery Velocities: Origin:  43.8 cm/sec Mid:  47.6 cm/sec [place]:  43.2 cm/sec Left Renal Artery Velocities: Origin:  70.2 cm/sec Mid:  67.8 cm/sec [place]:  67.7 cm/sec Aortic Velocity:  141 cm/sec All renal-aortic ratios are well under 1.0. Technologist describes technically difficult study secondary to bowel gas and patient inability to hold breath. IMPRESSION: 1. No Doppler ultrasound evidence of hemodynamically significant renal artery stenosis. 2. If there is continued clinical concern, renal MRA (lower radiation risk, can be performed noncontrast in the setting of renal dysfunction) and CTA ( higher spatial resolution) represent more accurate studies, which are  additionally more sensitive to the detection of duplicated renal arteries. Electronically Signed   By: Corlis Leak M.D.   On: 03/08/2023 18:26   DG Chest Port 1 View  Result Date: 03/08/2023 CLINICAL DATA:  Hypotension EXAM: PORTABLE CHEST 1 VIEW COMPARISON:  08/22/2007 FINDINGS: Single frontal view of the chest demonstrates an enlarged cardiac silhouette. There is atherosclerosis of the thoracic aorta, bilateral subclavian arteries, and bilateral carotid arteries. No acute airspace disease, effusion, or pneumothorax. No acute bony abnormalities. IMPRESSION: 1. Enlarged cardiac silhouette. 2. No acute airspace disease. 3. Extensive atherosclerosis. Electronically Signed   By: Sharlet Salina M.D.   On: 03/08/2023 14:53     Echo defered  TELEMETRY: Sinus bradycardia 55:  ASSESSMENT AND PLAN:  Principal Problem:   Malignant hypertension Peripheral vascular disease Carotid artery stenosis Bradycardia Atrial fibrillation Diastolic dysfunction Hyperlipidemia History of TIA Mild cognitive impairment History of lipoma Preop for left carotid endarterectomy . Plan Continue ICU level care Pain blood pressure management and control my goal is between systolic of 140 and 170 Improved bradycardia we discontinued clonidine will avoid AV nodal blocking drugs Agree with calcium blockers will hopefully switch from IV to p.o. medications Up out of bed to chair No definitive cardiac intervention necessary at this stage Case discussed with Dr. Mervyn Skeeters of ICU team Patient is acceptable moderate risk once blood pressure has stabilized     Alwyn Pea, MD 03/10/2023 10:03 AM

## 2023-03-11 ENCOUNTER — Inpatient Hospital Stay: Payer: Medicare Other

## 2023-03-11 LAB — URINALYSIS, COMPLETE (UACMP) WITH MICROSCOPIC
Bilirubin Urine: NEGATIVE
Glucose, UA: NEGATIVE mg/dL
Ketones, ur: NEGATIVE mg/dL
Nitrite: NEGATIVE
Protein, ur: 30 mg/dL — AB
Specific Gravity, Urine: 1.011 (ref 1.005–1.030)
pH: 5 (ref 5.0–8.0)

## 2023-03-11 LAB — BASIC METABOLIC PANEL
Anion gap: 8 (ref 5–15)
BUN: 61 mg/dL — ABNORMAL HIGH (ref 8–23)
CO2: 23 mmol/L (ref 22–32)
Calcium: 8.2 mg/dL — ABNORMAL LOW (ref 8.9–10.3)
Chloride: 104 mmol/L (ref 98–111)
Creatinine, Ser: 2.64 mg/dL — ABNORMAL HIGH (ref 0.44–1.00)
GFR, Estimated: 18 mL/min — ABNORMAL LOW (ref >60)
Glucose, Bld: 128 mg/dL — ABNORMAL HIGH (ref 70–99)
Potassium: 3.7 mmol/L (ref 3.5–5.1)
Sodium: 135 mmol/L (ref 135–145)

## 2023-03-11 LAB — PROTEIN / CREATININE RATIO, URINE
Creatinine, Urine: 160 mg/dL
Protein Creatinine Ratio: 0.15 mg/mg{Cre} (ref 0.00–0.15)
Total Protein, Urine: 24 mg/dL

## 2023-03-11 LAB — HEPATITIS B CORE ANTIBODY, TOTAL: Hep B Core Total Ab: NONREACTIVE

## 2023-03-11 LAB — HEPATITIS B CORE ANTIBODY, IGM: Hep B C IgM: NONREACTIVE

## 2023-03-11 LAB — GLUCOSE, CAPILLARY
Glucose-Capillary: 125 mg/dL — ABNORMAL HIGH (ref 70–99)
Glucose-Capillary: 122 mg/dL — ABNORMAL HIGH (ref 70–99)
Glucose-Capillary: 135 mg/dL — ABNORMAL HIGH (ref 70–99)
Glucose-Capillary: 119 mg/dL — ABNORMAL HIGH (ref 70–99)

## 2023-03-11 LAB — CBC
HCT: 34.3 % — ABNORMAL LOW (ref 36.0–46.0)
Hemoglobin: 11.5 g/dL — ABNORMAL LOW (ref 12.0–15.0)
MCH: 30.9 pg (ref 26.0–34.0)
MCHC: 33.5 g/dL (ref 30.0–36.0)
MCV: 92.2 fL (ref 80.0–100.0)
Platelets: 190 10*3/uL (ref 150–400)
RBC: 3.72 MIL/uL — ABNORMAL LOW (ref 3.87–5.11)
RDW: 14.2 % (ref 11.5–15.5)
WBC: 17.7 10*3/uL — ABNORMAL HIGH (ref 4.0–10.5)
nRBC: 0 % (ref 0.0–0.2)

## 2023-03-11 LAB — MAGNESIUM: Magnesium: 2.2 mg/dL (ref 1.7–2.4)

## 2023-03-11 LAB — HEPATITIS B SURFACE ANTIGEN: Hepatitis B Surface Ag: NONREACTIVE

## 2023-03-11 MED ORDER — HYDROCHLOROTHIAZIDE 25 MG PO TABS
50.0000 mg | ORAL_TABLET | Freq: Every day | ORAL | Status: DC
  Administered 2023-03-11: 50 mg via ORAL
  Filled 2023-03-11: qty 2

## 2023-03-11 MED ORDER — CLONIDINE HCL 0.1 MG PO TABS
0.2000 mg | ORAL_TABLET | Freq: Three times a day (TID) | ORAL | Status: DC
  Administered 2023-03-11 – 2023-03-12 (×4): 0.2 mg via ORAL
  Filled 2023-03-11 (×4): qty 2

## 2023-03-11 MED ORDER — HYDROCHLOROTHIAZIDE 50 MG PO TABS: 50.0000 mg | ORAL_TABLET | Freq: Every day | ORAL | Status: DC

## 2023-03-11 NOTE — Progress Notes (Signed)
[facility] Cardiology    SUBJECTIVE: Patient feels somewhat better no lightheadedness no chest pain no shortness of breath somewhat improved from previously blood pressure is still labile but improving   Vitals:   03/11/23 0800 03/11/23 0830 03/11/23 0900 03/11/23 1000  BP: (!) 177/44 (!) 184/50 (!) 184/58 (!) 185/41  Pulse: 63 65 64 65  Resp: 17 (!) 21 (!) 22 20  Temp:      TempSrc:      SpO2: 90% 93% 93% 92%  Weight:      Height:         Intake/Output Summary (Last 24 hours) at 03/11/2023 1025 Last data filed at 03/11/2023 1011 Gross per 24 hour  Intake 919.52 ml  Output 640 ml  Net 279.52 ml      PHYSICAL EXAM  General: Well developed, well nourished, in no acute distress HEENT:  Normocephalic and atramatic Neck:  No JVD.  Lungs: Clear bilaterally to auscultation and percussion. Heart: Cardia. Normal S1 and S2 without gallops or murmurs.  Abdomen: Bowel sounds are positive, abdomen soft and non-tender  Msk:  Back normal, normal gait. Normal strength and tone for age. Extremities: No clubbing, cyanosis or edema.   Neuro: Alert and oriented X 3. Psych:  Good affect, responds appropriately   LABS: Basic Metabolic Panel: Recent Labs    03/08/23 1436 03/08/23 1437 03/09/23 0406 03/10/23 0453 03/11/23 0417  NA  --    < > 138 136 135  K  --    < > 4.5 3.8 3.7  CL  --    < > 107 106 104  CO2  --    < > 23 21* 23  GLUCOSE  --    < > 125* 121* 128*  BUN  --    < > 38* 50* 61*  CREATININE  --    < > 2.38* 2.38* 2.64*  CALCIUM  --    < > 8.2* 8.5* 8.2*  MG  --   --  1.6*  --  2.2  PHOS 4.9*  --  3.2  --   --    < > = values in this interval not displayed.   Liver Function Tests: Recent Labs    03/08/23 1437 03/09/23 0406  AST 48* 82*  ALT 15 37  ALKPHOS 77 84  BILITOT 1.0 0.9  PROT 5.9* 6.5  ALBUMIN 3.1* 3.4*   No results for input(s): "LIPASE", "AMYLASE" in the last 72 hours. CBC: Recent Labs    03/08/23 1437 03/09/23 0406 03/11/23 0417  WBC 19.6*  20.6* 17.7*  NEUTROABS 17.4*  --   --   HGB 11.9* 11.9* 11.5*  HCT 36.0 35.5* 34.3*  MCV 92.1 92.2 92.2  PLT 172 204 190   Cardiac Enzymes: No results for input(s): "CKTOTAL", "CKMB", "CKMBINDEX", "TROPONINI" in the last 72 hours. BNP: Invalid input(s): "POCBNP" D-Dimer: No results for input(s): "DDIMER" in the last 72 hours. Hemoglobin A1C: Recent Labs    03/08/23 1756  HGBA1C 5.7*   Fasting Lipid Panel: No results for input(s): "CHOL", "HDL", "LDLCALC", "TRIG", "CHOLHDL", "LDLDIRECT" in the last 72 hours. Thyroid Function Tests: Recent Labs    03/08/23 1436  TSH 3.217   Anemia Panel: No results for input(s): "VITAMINB12", "FOLATE", "FERRITIN", "TIBC", "IRON", "RETICCTPCT" in the last 72 hours.  No results found.   Echo preserved left ventricular function EF of greater than 55%  TELEMETRY: Sinus bradycardia with junctional bradycardia diffuse nonspecific ST-T wave changes rate of 65:  ASSESSMENT AND  PLAN:  Principal Problem:   Malignant hypertension Preop for CEA carotid endarterectomy History of CVA Atrial fibrillation Peripheral vascular disease bilateral carotid artery disease Labile hypertension Bradycardia Hyperlipidemia TIA Cognitive impairment GERD  Plan Continue ICU level care for strict blood pressure management and control goal is systolic blood pressure under 161 Preop for carotid endarterectomy once blood pressure stabilizes Continue renal artery nephrology evaluation possible secondary hypo and hypertension renal artery stenosis consider renal angiogram GERD continue PPIs for reflux symptoms Bradycardia probably related to medication will discontinue clonidine and avoid AV nodal blocking drugs Atrial fibrillation slow ventricular response consider anticoagulation once patient stabilized DVT prophylaxis Diabetes type 2 uncomplicated continue medical therapy Conservative cardiac input at this stage    Alwyn Pea, MD 03/11/2023 10:25  AM

## 2023-03-11 NOTE — Consult Note (Signed)
[facility]  CONSULT NOTE    Date: 03/11/2023                  Patient Name:  Karina Leonard  MRN: [id number]  DOB: 31-Oct-1940  Age / Sex: 82 y.o., female         PCP: Duanne Limerick, MD                 Service Requesting Consult: Dr. Wyn Quaker                 Reason for Consult: Acute kidney injury            History of Present Illness: Karina Leonard was scheduled for left carotid endarterectomy by Dr. Wyn Quaker on 5/16 however patient found to have hypertension and admitted for hypertensive management. Placed on nifedipine gtt and now changed to clevedipine gtt.   Patient unaware that she had any chronic kidney disease. She does endorse sparing using of Aleve.   Patient denies history of diabetes.   Patient claims to be taking all her home medications as prescribed. She states her blood pressure is mostly at goal at home.    Medications: Outpatient medications: Medications Prior to Admission  Medication Sig Dispense Refill Last Dose   aspirin EC 81 MG tablet Take 81 mg by mouth daily. Take while off of the plavix for surgey   03/07/2023   cloNIDine (CATAPRES - DOSED IN MG/24 HR) 0.2 mg/24hr patch Place 0.2 mg onto the skin once a week. Sunday   Past Week   fexofenadine (ALLEGRA) 180 MG tablet Take 1 tablet (180 mg total) by mouth daily. otc 90 tablet 1 Past Week   fluticasone (FLONASE) 50 MCG/ACT nasal spray Place 1 spray into both nostrils daily. otc   Past Week   furosemide (LASIX) 20 MG tablet Take 20 mg by mouth daily. callwood   Past Week   losartan-hydrochlorothiazide (HYZAAR) 100-25 MG tablet Take 1 tablet by mouth daily. 90 tablet 1 03/07/2023   lovastatin (MEVACOR) 20 MG tablet TAKE 2 TABLETS EVERY DAY (Patient taking differently: Take 2 tablets by mouth at bedtime. TAKE 2 TABLETS EVERY DAY) 180 tablet 1 03/07/2023   metoprolol succinate (TOPROL-XL) 50 MG 24 hr tablet Take 1 tablet (50 mg total) by mouth daily. Callwood 90 tablet 0 03/08/2023 at 0500    venlafaxine XR (EFFEXOR-XR) 75 MG 24 hr capsule Take 1 capsule (75 mg total) by mouth daily. (Patient taking differently: Take 75 mg by mouth at bedtime.) 90 capsule 1 03/07/2023   clopidogrel (PLAVIX) 75 MG tablet Take 1 tablet (75 mg total) by mouth daily. 90 tablet 1 03/01/2023    Current medications: Current Facility-Administered Medications  Medication Dose Route Frequency Provider Last Rate Last Admin   0.9 %  sodium chloride infusion  250 mL Intravenous Continuous Ezequiel Essex, NP   Stopped at 03/09/23 1239   acetaminophen (TYLENOL) tablet 325-650 mg  325-650 mg Oral Q4H PRN Annice Needy, MD   650 mg at 03/10/23 1000   Or   acetaminophen (TYLENOL) suppository 325-650 mg  325-650 mg Rectal Q4H PRN Annice Needy, MD       alum & mag hydroxide-simeth (MAALOX/MYLANTA) 200-200-20 MG/5ML suspension 15-30 mL  15-30 mL Oral Q2H PRN Annice Needy, MD       amLODipine (NORVASC) tablet 5 mg  5 mg Oral BID Callwood, Dwayne D, MD   5 mg at 03/11/23 0925   aspirin  EC tablet 81 mg  81 mg Oral Daily Annice Needy, MD   81 mg at 03/11/23 4332   Chlorhexidine Gluconate Cloth 2 % PADS 6 each  6 each Topical Daily Annice Needy, MD   6 each at 03/10/23 2151   clevidipine (CLEVIPREX) infusion 0.5 mg/mL  0-21 mg/hr Intravenous Continuous Vida Rigger, MD 16 mL/hr at 03/11/23 1140 8 mg/hr at 03/11/23 1140   docusate sodium (COLACE) capsule 100 mg  100 mg Oral BID Annice Needy, MD   100 mg at 03/08/23 0904   enoxaparin (LOVENOX) injection 30 mg  30 mg Subcutaneous Q24H Sharen Hones, RPH   30 mg at 03/10/23 2107   fluticasone (FLONASE) 50 MCG/ACT nasal spray 1 spray  1 spray Each Nare Daily Annice Needy, MD       furosemide (LASIX) tablet 20 mg  20 mg Oral Daily Annice Needy, MD   20 mg at 03/11/23 0925   guaiFENesin-dextromethorphan (ROBITUSSIN DM) 100-10 MG/5ML syrup 15 mL  15 mL Oral Q4H PRN Annice Needy, MD       hydrochlorothiazide (HYDRODIURIL) tablet 50 mg  50 mg Oral Daily Mila Merry A, RPH   50  mg at 03/11/23 9518   insulin aspart (novoLOG) injection 0-5 Units  0-5 Units Subcutaneous QHS Ezequiel Essex, NP       insulin aspart (novoLOG) injection 0-9 Units  0-9 Units Subcutaneous TID WC Ezequiel Essex, NP       loperamide (IMODIUM) capsule 2 mg  2 mg Oral PRN Ezequiel Essex, NP   2 mg at 03/10/23 1031   loratadine (CLARITIN) tablet 10 mg  10 mg Oral Daily Annice Needy, MD   10 mg at 03/11/23 0924   losartan (COZAAR) tablet 100 mg  100 mg Oral Daily Tressie Ellis, RPH   100 mg at 03/11/23 0932   magnesium hydroxide (MILK OF MAGNESIA) suspension 30 mL  30 mL Oral Daily PRN Annice Needy, MD       melatonin tablet 5 mg  5 mg Oral QHS PRN Rust-Chester, Cecelia Byars, NP   5 mg at 03/09/23 2144   ondansetron (ZOFRAN) injection 4 mg  4 mg Intravenous Q6H PRN Annice Needy, MD       oxyCODONE (Oxy IR/ROXICODONE) immediate release tablet 5 mg  5 mg Oral Q4H PRN Jimmye Norman, NP   5 mg at 03/10/23 2109   pantoprazole (PROTONIX) EC tablet 40 mg  40 mg Oral Daily Annice Needy, MD   40 mg at 03/11/23 0926   phenol (CHLORASEPTIC) mouth spray 1 spray  1 spray Mouth/Throat PRN Annice Needy, MD       pravastatin (PRAVACHOL) tablet 40 mg  40 mg Oral q1800 Annice Needy, MD   40 mg at 03/10/23 1721   venlafaxine XR (EFFEXOR-XR) 24 hr capsule 75 mg  75 mg Oral QHS Annice Needy, MD   75 mg at 03/10/23 2109      Allergies: Allergies  Allergen Reactions   Amoxicillin Hives   Donepezil Diarrhea   Nicardipine Hcl In Dextrose Other (See Comments)    Patient vasovagal response on low dose cardene   Penicillins Hives      Past Medical History: Past Medical History:  Diagnosis Date   Allergy    Aortic atherosclerosis (HCC)    Atrial fibrillation with slow ventricular response (HCC) 02/27/2023   a.) noted on preoperative ECG 02/27/2023; A.fib at 52  bpm with LVH and (+) inferolateral TWIs; b.) CHA2DS2VASc = 7 (age x 2, sex, HTN, CVA/TIA x2, vascular disease history); c.) rate/rhythm maintained  on oral metoprolol; chronically antithrombotic therapy (ASA + clopidogrel)   Bilateral carotid artery disease (HCC)    a.) doppler 01/25/2023: 60% RICA, 70% LICA   Bradycardia    Cataract cortical, senile    Cerebrovascular disease    Coronary artery disease involving native coronary artery of native heart without angina pectoris    a.) MPI 05/24/2020: EF 45-50%. Borderline ant/lat defect with border zone redistribution; b.) MPI 01/08/2023: EF 45%; mild-mod mixed ant apical defect c/w infarct vs. ischemia   DDD (degenerative disc disease), lumbar    Depression    Diastolic dysfunction    a.) TTE 01/08/2023: EF >55%, mod LVH, sev LAE, mild MR/TR/PR, G1DD   Full dentures    Hyperlipidemia    Hypertension    Keloid    Lipoma    Long term current use of antithrombotics/antiplatelets    a.) on DAPT (ASA + clopidogrel)   Mild cognitive impairment    Pre-diabetes    Sciatica    Stroke [facility])    a.) MRI brain 03/19/2020: multiple old small vessel infarcts of the basal ganglia, thalamus and pons   TIA (transient ischemic attack)    Urinary incontinence      Past Surgical History: Past Surgical History:  Procedure Laterality Date   CHOLECYSTECTOMY  2009   COLONOSCOPY     RECTAL POLYPECTOMY     benign     Family History: Family History  Problem Relation Age of Onset   Cancer Mother        uterine or ovarian. Unable to remember   Cancer Father        lung   Heart attack Sister      Social History: Social History   Socioeconomic History   Marital status: Widowed    Spouse name: Not on file   Number of children: 3   Years of education: some college   Highest education level: 12th grade  Occupational History   Occupation: Retired  Tobacco Use   Smoking status: Former    Packs/day: 1.00    Years: 20.00    Additional pack years: 0.00    Total pack years: 20.00    Types: Cigarettes    Quit date: 23    Years since quitting: 57.4   Smokeless tobacco: Never  Vaping  Use   Vaping Use: Never used  Substance and Sexual Activity   Alcohol use: Yes    Alcohol/week: 0.0 standard drinks of alcohol    Comment: special occasions   Drug use: No   Sexual activity: Never  Other Topics Concern   Not on file  Social History Narrative   Lives alone   Social Determinants of Health   Financial Resource Strain: Low Risk  (03/22/2022)   Overall Financial Resource Strain (CARDIA)    Difficulty of Paying Living Expenses: Not hard at all  Food Insecurity: No Food Insecurity (03/08/2023)   Hunger Vital Sign    Worried About Running Out of Food in the Last Year: Never true    Ran Out of Food in the Last Year: Never true  Transportation Needs: No Transportation Needs (03/08/2023)   PRAPARE - Administrator, Civil Service (Medical): No    Lack of Transportation (Non-Medical): No  Physical Activity: Insufficiently Active (03/22/2022)   Exercise Vital Sign    Days of Exercise per Week:  3 days    Minutes of Exercise per Session: 30 min  Stress: No Stress Concern Present (03/22/2022)   [employer] of Occupational Health - Occupational Stress Questionnaire    Feeling of Stress : Not at all  Social Connections: Moderately Isolated (03/22/2022)   Social Connection and Isolation Panel [NHANES]    Frequency of Communication with Friends and Family: More than three times a week    Frequency of Social Gatherings with Friends and Family: Twice a week    Attends Religious Services: More than 4 times per year    Active Member of [employer] or Organizations: No    Attends Banker Meetings: Never    Marital Status: Widowed  Intimate Partner Violence: Not At Risk (03/08/2023)   Humiliation, Afraid, Rape, and Kick questionnaire    Fear of Current or Ex-Partner: No    Emotionally Abused: No    Physically Abused: No    Sexually Abused: No       Vital Signs: Blood pressure (!) 210/50, pulse 67, temperature 98.4 F (36.9 C), temperature source Oral,  resp. rate (!) 23, height 5\' 7"  (1.702 m), weight 88.1 kg, SpO2 95 %.  Weight trends: Filed Weights   03/08/23 0626 03/08/23 0841  Weight: 87.5 kg 88.1 kg    Physical Exam: General: NAD, sitting up in bed  Head: Normocephalic, atraumatic. Moist oral mucosal membranes  Eyes: Anicteric, PERRL  Neck: Supple, trachea midline  Lungs:  Clear to auscultation  Heart: Regular rate and rhythm  Abdomen:  Soft, nontender,   Extremities:  no peripheral edema.  Neurologic: Nonfocal, moving all four extremities  Skin: No lesions  Access: none     Lab results: Basic Metabolic Panel: Recent Labs  Lab 03/08/23 1436 03/08/23 1437 03/09/23 0406 03/10/23 0453 03/11/23 0417  NA  --    < > 138 136 135  K  --    < > 4.5 3.8 3.7  CL  --    < > 107 106 104  CO2  --    < > 23 21* 23  GLUCOSE  --    < > 125* 121* 128*  BUN  --    < > 38* 50* 61*  CREATININE  --    < > 2.38* 2.38* 2.64*  CALCIUM  --    < > 8.2* 8.5* 8.2*  MG  --   --  1.6*  --  2.2  PHOS 4.9*  --  3.2  --   --    < > = values in this interval not displayed.    Liver Function Tests: Recent Labs  Lab 03/08/23 1437 03/09/23 0406  AST 48* 82*  ALT 15 37  ALKPHOS 77 84  BILITOT 1.0 0.9  PROT 5.9* 6.5  ALBUMIN 3.1* 3.4*   No results for input(s): "LIPASE", "AMYLASE" in the last 168 hours. No results for input(s): "AMMONIA" in the last 168 hours.  CBC: Recent Labs  Lab 03/08/23 1043 03/08/23 1437 03/09/23 0406 03/11/23 0417  WBC 13.2* 19.6* 20.6* 17.7*  NEUTROABS  --  17.4*  --   --   HGB 11.4* 11.9* 11.9* 11.5*  HCT 34.4* 36.0 35.5* 34.3*  MCV 91.7 92.1 92.2 92.2  PLT 199 172 204 190    Cardiac Enzymes: No results for input(s): "CKTOTAL", "CKMB", "CKMBINDEX", "TROPONINI" in the last 168 hours.  BNP: Invalid input(s): "POCBNP"  CBG: Recent Labs  Lab 03/10/23 1314 03/10/23 1614 03/10/23 2105 03/11/23 0743 03/11/23 1118  GLUCAP 121*  119* 130* 125* 122*    Microbiology: Results for orders placed  or performed during the hospital encounter of 02/27/23  Surgical pcr screen     Status: None   Collection Time: 02/27/23 12:00 PM   Specimen: Nasal Mucosa; Nasal Swab  Result Value Ref Range Status   MRSA, PCR NEGATIVE NEGATIVE Final   Staphylococcus aureus NEGATIVE NEGATIVE Final    Comment: (NOTE) The Xpert SA Assay (FDA approved for NASAL specimens in patients 41 years of age and older), is one component of a comprehensive surveillance program. It is not intended to diagnose infection nor to guide or monitor treatment. Performed at [facility], [address] [place], [place] [address]     Coagulation Studies: No results for input(s): "LABPROT", "INR" in the last 72 hours.  Urinalysis: No results for input(s): "COLORURINE", "LABSPEC", "PHURINE", "GLUCOSEU", "HGBUR", "BILIRUBINUR", "KETONESUR", "PROTEINUR", "UROBILINOGEN", "NITRITE", "LEUKOCYTESUR" in the last 72 hours.  Invalid input(s): "APPERANCEUR"    Imaging: No results found.   Assessment & Plan: Karina Leonard is a 82 y.o. black female with hypertension, peripheral vascular disease, carotid artery stenosis, coronary artery disease, atrial fibrillation, diastolic congestive heart failure and TIA, who was admitted to [facility] on 03/08/2023 for Malignant hypertension [I10]  Acute Kidney Injury on chronic kidney disease stage IIIA: baseline creatinine of 1.17, GFR of 47 on 02/27/23. Acute kidney injury secondary to hypertensive emergency. Urinalysis with proteinuria and hematuria from 2023. No IV contrast exposure. No signs of obstruction.  - no acute indication for dialysis.  - hold losartan - Check urine studies.   Hypertension: with hypertensive emergency on admission. Placed on clevedipine gtt. Secondary work up shows no renal artery stenosis and normal thyroid function panel. Echocardiogram from 01/08/23 reviewed.  - Home regimen of clonidine, furosemide, losartan, hydrochlorothiazide and metoprolol which  have all been reordered.  - started on amlodipine this admission.  - will hold hydrochlorothiazide, furosemide and losartan  - Check plasma renin activity, aldosterone, cortisol, metanephrines and catecholamines - start patient on clonidine. Stopping this agent can cause severe rebound hypertension.       LOS: 3 Salah Burlison 5/19/202412:01 PM

## 2023-03-11 NOTE — Progress Notes (Signed)
NAME:  Karina Leonard, MRN:  [id number], DOB:  September 25, 1941, LOS: 3 ADMISSION DATE:  03/08/2023, CONSULTATION DATE: 03/08/23 REFERRING MD: Dr. Wyn Quaker, CHIEF COMPLAINT: Hypotension    History of Present Illness:  This is an 82 yo female with carotid artery stenosis who presented for an elective left carotid endarterectomy per vascular surgery on 05/16.  However, procedure canceled due to severe hypertension sbp 200's.  Pt subsequently admitted to the stepdown unit per vascular surgery and nitroglycerin gtt initiated for bp control.  Pt also restarted on her outpatient antihypertensives.  However, she remained hypertensive and nicardipine gtt added.  Pt later developed symptomatic hypotension sbp 90's to low 100's.  According to pts daughters pts baseline sbp 140-160's.  PCCM team consulted and levophed gtt initiated.    03/10/23- patient had another episode of accelerated hypertension    - we have reviewed medications , cleviprex gtt started overnight. Patient seems to have developed hydralazine tolerance with rebound effect. Have increased HCTZ to 50 from 25.  Have met with cardiology - Dr Juliann Pares , amlodipine 5 bid has been initiated. Will consider additional therapy as needed while weaning down cleviprex gtt.  Her Chronic renal impairment seems to have acute component with oliguric aki, will get nephrology to evaluate.  03/11/23- patient in no distress this am.  BP still elevated and still requires cleviprex titration with the PO regimen.  Ive discussed her therapy with nephorlogist today and hopefully we can find regimen that works well for patient. This has been a tough refractory case and I certainly appreciate everyone collaborating.   Pertinent  Medical History  Aortic Atherosclerosis  Atrial Fibrillation with Slow Ventricular Response  Bilateral Carotid Artery Disease  Bradycardia  CAD Degenerative Disc Disease  Depression  Diastolic Dysfunction  HLD  HTN Lipoma  Mild Cognitive Impairment   Pre-Diabetes  Sciatica TIA  Urinary Incontinence   Significant Hospital Events: Including procedures, antibiotic start and stop dates in addition to other pertinent events   05/16: Pt initially admitted to the stepdown unit with hypertensive urgency and later developed symptomatic hypotension secondary to antihypertensive medication requiring levophed gtt PCCM team consulted to assist with management    Objective   Blood pressure (!) 185/41, pulse 65, temperature 98.4 F (36.9 C), temperature source Oral, resp. rate 20, height 5\' 7"  (1.702 m), weight 88.1 kg, SpO2 92 %.        Intake/Output Summary (Last 24 hours) at 03/11/2023 1016 Last data filed at 03/11/2023 1011 Gross per 24 hour  Intake 919.52 ml  Output 640 ml  Net 279.52 ml    Filed Weights   03/08/23 0626 03/08/23 0841  Weight: 87.5 kg 88.1 kg    Examination: General: Acutely-ill appearing female,  HENT: Supple, no JVD  Lungs: Clear throughout, even, non labored  Cardiovascular: Junctional rhythm, no m/r/g, 2+ radial/1+ distal pulses, no edema  Abdomen: +BS x4, obese, soft, non tender  Extremities: Normal bulk and tone, moves all extremities  Neuro: Mildly lethargic, oriented, followings commands, PERRLA GU: External catheter draining yellow urine   Resolved Hospital Problem list     Assessment & Plan:  #Hypertension urgency~resolved  #Hypotension secondary to antihypertensive medications  Hx: Bradycardia, HLD, CAD, atrial fibrillation with slow ventricular response  Echo 01/08/23: EF >55%; mild MR/TR; trivial PR; moderate LVH - Continuous telemetry monitoring  - Prn levophed gtt to maintain sbp 140 to 160's - Hold nitroglycerin/nicardipine gtts and outpatient antihypertensives for now - TSH 3.217/Free T4 1.00 - Follow EKG's  -  Trend troponin's until peaked  - US Renal Artery Duplex Complete pending  - Cardiology consulted appreciate input  - Continue outpatient asprin and statin   #Bilateral carotid  stenosis (50% right common carotid artery stenosis/60% right internal carotid stenosis with a 70% left common carotid artery stenosis) - Vascular surgery consulted appreciate input: once stabilized will proceed with left carotid endarterectomy  #Acute kidney injury on Chronic hypertensive nephropathy -  likely secondary to hypotension  - Trend BMP  - Replace electrolytes as indicated  - Monitor UOP - Avoid nephrotoxic medications when able  -nephrology consultation  #Chronic diarrhea  #GERD  - Continue PPI - Prn imodium   #Type II diabetes mellitus  - CBG's ac/hs  - SSI  - Hemoglobin A1c pending   Best Practice (right click and "Reselect all SmartList Selections" daily)   Diet/type: Regular consistency (see orders) DVT prophylaxis: LMWH GI prophylaxis: PPI Lines: N/A Foley:  N/A Code Status:  full code Last date of multidisciplinary goals of care discussion [N/A]  05/16: Updated pt and pts daughter's at bedside regarding pts condition and current plan of care.  All questions were answered  Labs   CBC: Recent Labs  Lab 03/08/23 1043 03/08/23 1437 03/09/23 0406 03/11/23 0417  WBC 13.2* 19.6* 20.6* 17.7*  NEUTROABS  --  17.4*  --   --   HGB 11.4* 11.9* 11.9* 11.5*  HCT 34.4* 36.0 35.5* 34.3*  MCV 91.7 92.1 92.2 92.2  PLT 199 172 204 190     Basic Metabolic Panel: Recent Labs  Lab 03/08/23 1436 03/08/23 1437 03/09/23 0406 03/10/23 0453 03/11/23 0417  NA  --  137 138 136 135  K  --  4.4 4.5 3.8 3.7  CL  --  108 107 106 104  CO2  --  16* 23 21* 23  GLUCOSE  --  306* 125* 121* 128*  BUN  --  28* 38* 50* 61*  CREATININE  --  1.74* 2.38* 2.38* 2.64*  CALCIUM  --  7.9* 8.2* 8.5* 8.2*  MG  --   --  1.6*  --  2.2  PHOS 4.9*  --  3.2  --   --     GFR: Estimated Creatinine Clearance: 19.4 mL/min (A) (by C-G formula based on SCr of 2.64 mg/dL (H)). Recent Labs  Lab 03/08/23 1043 03/08/23 1437 03/09/23 0406 03/11/23 0417  WBC 13.2* 19.6* 20.6* 17.7*      Liver Function Tests: Recent Labs  Lab 03/08/23 1437 03/09/23 0406  AST 48* 82*  ALT 15 37  ALKPHOS 77 84  BILITOT 1.0 0.9  PROT 5.9* 6.5  ALBUMIN 3.1* 3.4*    No results for input(s): "LIPASE", "AMYLASE" in the last 168 hours. No results for input(s): "AMMONIA" in the last 168 hours.  ABG No results found for: "PHART", "PCO2ART", "PO2ART", "HCO3", "TCO2", "ACIDBASEDEF", "O2SAT"   Coagulation Profile: No results for input(s): "INR", "PROTIME" in the last 168 hours.  Cardiac Enzymes: No results for input(s): "CKTOTAL", "CKMB", "CKMBINDEX", "TROPONINI" in the last 168 hours.  HbA1C: Hgb A1c MFr Bld  Date/Time Value Ref Range Status  03/08/2023 05:56 PM 5.7 (H) 4.8 - 5.6 % Final    Comment:    (NOTE) Pre diabetes:          5.7%-6.4%  Diabetes:              >6.4%  Glycemic control for   <7.0% adults with diabetes   02/02/2023 11:14 AM 6.0 (H) 4.8 - 5.6 %  Final    Comment:             Prediabetes: 5.7 - 6.4          Diabetes: >6.4          Glycemic control for adults with diabetes: <7.0     CBG: Recent Labs  Lab 03/10/23 1118 03/10/23 1314 03/10/23 1614 03/10/23 2105 03/11/23 0743  GLUCAP 156* 121* 119* 130* 125*     Review of Systems: Positives in BOLD   Gen: fever, chills, weight change, fatigue, night sweats HEENT: Denies blurred vision, double vision, hearing loss, tinnitus, sinus congestion, rhinorrhea, sore throat, neck stiffness, dysphagia PULM: shortness of breath, cough, sputum production, hemoptysis, wheezing CV: Denies chest pain, edema, orthopnea, paroxysmal nocturnal dyspnea, palpitations GI: Denies abdominal pain, nausea, vomiting, diarrhea, hematochezia, melena, constipation, change in bowel habits GU: Denies dysuria, hematuria, polyuria, oliguria, urethral discharge Endocrine: Denies hot or cold intolerance, polyuria, polyphagia or appetite change Derm: Denies rash, dry skin, scaling or peeling skin change Heme: Denies easy  bruising, bleeding, bleeding gums Neuro: dizziness, headache, numbness, weakness, slurred speech, loss of memory or consciousness   Past Medical History:  She,  has a past medical history of Allergy, Aortic atherosclerosis (HCC), Atrial fibrillation with slow ventricular response (HCC) (02/27/2023), Bilateral carotid artery disease (HCC), Bradycardia, Cataract cortical, senile, Cerebrovascular disease, Coronary artery disease involving native coronary artery of native heart without angina pectoris, DDD (degenerative disc disease), lumbar, Depression, Diastolic dysfunction, Full dentures, Hyperlipidemia, Hypertension, Keloid, Lipoma, Long term current use of antithrombotics/antiplatelets, Mild cognitive impairment, Pre-diabetes, Sciatica, Stroke (HCC), TIA (transient ischemic attack), and Urinary incontinence.   Surgical History:   Past Surgical History:  Procedure Laterality Date   CHOLECYSTECTOMY  2009   COLONOSCOPY     RECTAL POLYPECTOMY     benign     Social History:   reports that she quit smoking about 57 years ago. Her smoking use included cigarettes. She has a 20.00 pack-year smoking history. She has never used smokeless tobacco. She reports current alcohol use. She reports that she does not use drugs.   Family History:  Her family history includes Cancer in her father and mother; Heart attack in her sister.   Allergies Allergies  Allergen Reactions   Amoxicillin Hives   Donepezil Diarrhea   Nicardipine Hcl In Dextrose Other (See Comments)    Patient vasovagal response on low dose cardene   Penicillins Hives     Home Medications  Prior to Admission medications   Medication Sig Start Date End Date Taking? Authorizing Provider  aspirin EC 81 MG tablet Take 81 mg by mouth daily. Take while off of the plavix for surgey   Yes [provider]  cloNIDine (CATAPRES - DOSED IN MG/24 HR) 0.2 mg/24hr patch Place 0.2 mg onto the skin once a week. Sunday   Yes [provider]  fexofenadine (ALLEGRA) 180 MG tablet Take 1 tablet (180 mg total) by mouth daily. otc 07/22/18  Yes Duanne Limerick, MD  fluticasone (FLONASE) 50 MCG/ACT nasal spray Place 1 spray into both nostrils daily. otc   Yes [provider]  furosemide (LASIX) 20 MG tablet Take 20 mg by mouth daily. [place] 01/18/23  Yes [provider]  losartan-hydrochlorothiazide (HYZAAR) 100-25 MG tablet Take 1 tablet by mouth daily. 02/02/23  Yes Duanne Limerick, MD  lovastatin (MEVACOR) 20 MG tablet TAKE 2 TABLETS EVERY DAY Patient taking differently: Take 2 tablets by mouth at bedtime. TAKE 2 TABLETS EVERY DAY 02/02/23  Yes Duanne Limerick, MD  metoprolol succinate (TOPROL-XL) 50 MG 24 hr tablet Take 1 tablet (50 mg total) by mouth daily. [place] 05/25/22  Yes Duanne Limerick, MD  venlafaxine XR (EFFEXOR-XR) 75 MG 24 hr capsule Take 1 capsule (75 mg total) by mouth daily. Patient taking differently: Take 75 mg by mouth at bedtime. 02/02/23  Yes Duanne Limerick, MD  clopidogrel (PLAVIX) 75 MG tablet Take 1 tablet (75 mg total) by mouth daily. 02/02/23   Duanne Limerick, MD     Critical care provider statement:   Total critical care time: 33 minutes   Performed by: Karna Christmas MD   Critical care time was exclusive of separately billable procedures and treating other patients.   Critical care was necessary to treat or prevent imminent or life-threatening deterioration.   Critical care was time spent personally by me on the following activities: development of treatment plan with patient and/or surrogate as well as nursing, discussions with consultants, evaluation of patient's response to treatment, examination of patient, obtaining history from patient or surrogate, ordering and performing treatments and interventions, ordering and review of laboratory studies, ordering and review of radiographic studies, pulse oximetry and re-evaluation of patient's condition.    Vida Rigger, M.D.   Pulmonary & Critical Care Medicine

## 2023-03-11 NOTE — Progress Notes (Signed)
3 Days Post-Op   Subjective/Chief Complaint: Without complaint- denies further right arm pain/discomfort after analgesia given. SBP- 170s on multiple agents and Cleviprex. States prior TIA was forgetfulness.   Objective: Vital signs in last 24 hours: Temp:  [97.6 F (36.4 C)-98.4 F (36.9 C)] 98.4 F (36.9 C) (05/19 0400) Pulse Rate:  [54-69] 64 (05/19 0900) Resp:  [17-23] 22 (05/19 0900) BP: (98-208)/(37-111) 184/58 (05/19 0900) SpO2:  [89 %-96 %] 93 % (05/19 0900) Last BM Date : 03/09/23  Intake/Output from previous day: 05/18 0701 - 05/19 0700 In: 723.2 [P.O.:300; I.V.:423.2] Out: 500 [Urine:500] Intake/Output this shift: Total I/O In: 16 [I.V.:16] Out: -   General appearance: alert and no distress Cardio: regular rate and rhythm Extremities: extremities normal, atraumatic, no cyanosis or edema Neurologic: Grossly normal  Lab Results:  Recent Labs    03/09/23 0406 03/11/23 0417  WBC 20.6* 17.7*  HGB 11.9* 11.5*  HCT 35.5* 34.3*  PLT 204 190   BMET Recent Labs    03/10/23 0453 03/11/23 0417  NA 136 135  K 3.8 3.7  CL 106 104  CO2 21* 23  GLUCOSE 121* 128*  BUN 50* 61*  CREATININE 2.38* 2.64*  CALCIUM 8.5* 8.2*   PT/INR No results for input(s): "LABPROT", "INR" in the last 72 hours. ABG No results for input(s): "PHART", "HCO3" in the last 72 hours.  Invalid input(s): "PCO2", "PO2"  Studies/Results: No results found.  Anti-infectives: Anti-infectives (From admission, onward)    Start     Dose/Rate Route Frequency Ordered Stop   03/08/23 0600  vancomycin (VANCOCIN) IVPB 1000 mg/200 mL premix        1,000 mg 200 mL/hr over 60 Minutes Intravenous On call to O.R. 03/08/23 0018 03/08/23 0855       Assessment/Plan: Malignant HTN- continue regimen- appreciate Cardiology and Intensivist input/care Will plan for renal angio-possible intervention this week Neuro checks to ensure no new TIA symptoms  LOS: 3 days    Karina Leonard  A 03/11/2023

## 2023-03-12 ENCOUNTER — Inpatient Hospital Stay: Payer: Medicare Other

## 2023-03-12 DIAGNOSIS — I6523 Occlusion and stenosis of bilateral carotid arteries: Secondary | ICD-10-CM | POA: Diagnosis not present

## 2023-03-12 DIAGNOSIS — R7989 Other specified abnormal findings of blood chemistry: Secondary | ICD-10-CM

## 2023-03-12 DIAGNOSIS — N179 Acute kidney failure, unspecified: Secondary | ICD-10-CM | POA: Diagnosis not present

## 2023-03-12 DIAGNOSIS — I10 Essential (primary) hypertension: Secondary | ICD-10-CM

## 2023-03-12 DIAGNOSIS — I639 Cerebral infarction, unspecified: Secondary | ICD-10-CM

## 2023-03-12 LAB — CBC
HCT: 34.5 % — ABNORMAL LOW (ref 36.0–46.0)
Hemoglobin: 11.2 g/dL — ABNORMAL LOW (ref 12.0–15.0)
MCH: 30.4 pg (ref 26.0–34.0)
MCHC: 32.5 g/dL (ref 30.0–36.0)
MCV: 93.8 fL (ref 80.0–100.0)
Platelets: 187 10*3/uL (ref 150–400)
RBC: 3.68 MIL/uL — ABNORMAL LOW (ref 3.87–5.11)
RDW: 14.3 % (ref 11.5–15.5)
WBC: 12.8 10*3/uL — ABNORMAL HIGH (ref 4.0–10.5)
nRBC: 0 % (ref 0.0–0.2)

## 2023-03-12 LAB — RENAL FUNCTION PANEL
Albumin: 2.7 g/dL — ABNORMAL LOW (ref 3.5–5.0)
Anion gap: 9 (ref 5–15)
BUN: 65 mg/dL — ABNORMAL HIGH (ref 8–23)
CO2: 21 mmol/L — ABNORMAL LOW (ref 22–32)
Calcium: 7.8 mg/dL — ABNORMAL LOW (ref 8.9–10.3)
Chloride: 104 mmol/L (ref 98–111)
Creatinine, Ser: 3.37 mg/dL — ABNORMAL HIGH (ref 0.44–1.00)
GFR, Estimated: 13 mL/min — ABNORMAL LOW (ref >60)
Glucose, Bld: 124 mg/dL — ABNORMAL HIGH (ref 70–99)
Phosphorus: 4.6 mg/dL (ref 2.5–4.6)
Potassium: 3.7 mmol/L (ref 3.5–5.1)
Sodium: 134 mmol/L — ABNORMAL LOW (ref 135–145)

## 2023-03-12 LAB — GLUCOSE, CAPILLARY
Glucose-Capillary: 109 mg/dL — ABNORMAL HIGH (ref 70–99)
Glucose-Capillary: 157 mg/dL — ABNORMAL HIGH (ref 70–99)
Glucose-Capillary: 93 mg/dL (ref 70–99)
Glucose-Capillary: 133 mg/dL — ABNORMAL HIGH (ref 70–99)

## 2023-03-12 LAB — TROPONIN I (HIGH SENSITIVITY)
Troponin I (High Sensitivity): 2765 ng/L (ref <18)
Troponin I (High Sensitivity): 3149 ng/L (ref <18)
Troponin I (High Sensitivity): 2667 ng/L (ref <18)

## 2023-03-12 LAB — KAPPA/LAMBDA LIGHT CHAINS
Kappa free light chain: 42.3 mg/L — ABNORMAL HIGH (ref 3.3–19.4)
Kappa, lambda light chain ratio: 1.12 (ref 0.26–1.65)
Lambda free light chains: 37.8 mg/L — ABNORMAL HIGH (ref 5.7–26.3)

## 2023-03-12 LAB — MAGNESIUM: Magnesium: 2.1 mg/dL (ref 1.7–2.4)

## 2023-03-12 LAB — TRIGLYCERIDES: Triglycerides: 76 mg/dL (ref <150)

## 2023-03-12 LAB — CORTISOL-AM, BLOOD: Cortisol - AM: 7.4 ug/dL (ref 6.7–22.6)

## 2023-03-12 LAB — PROCALCITONIN: Procalcitonin: 1 ng/mL

## 2023-03-12 MED ORDER — AMLODIPINE BESYLATE 10 MG PO TABS: 10.0000 mg | ORAL_TABLET | Freq: Every day | ORAL | Status: DC

## 2023-03-12 MED ORDER — AMLODIPINE BESYLATE 5 MG PO TABS: 5.0000 mg | ORAL_TABLET | Freq: Once | ORAL | Status: DC

## 2023-03-12 MED ORDER — CLONIDINE HCL 0.1 MG PO TABS
0.1000 mg | ORAL_TABLET | Freq: Three times a day (TID) | ORAL | Status: AC
  Administered 2023-03-12 – 2023-03-14 (×8): 0.1 mg via ORAL
  Filled 2023-03-12 (×8): qty 1

## 2023-03-12 MED ORDER — STROKE: EARLY STAGES OF RECOVERY BOOK: Freq: Once | Status: AC

## 2023-03-12 MED ORDER — SODIUM CHLORIDE 0.9 % IV SOLN: INTRAVENOUS | Status: DC

## 2023-03-12 MED ORDER — HEPARIN BOLUS VIA INFUSION
4000.0000 [IU] | Freq: Once | INTRAVENOUS | Status: AC
  Administered 2023-03-12: 4000 [IU] via INTRAVENOUS
  Filled 2023-03-12: qty 4000

## 2023-03-12 MED ORDER — HEPARIN (PORCINE) 25000 UT/250ML-% IV SOLN
1100.0000 [IU]/h | INTRAVENOUS | Status: DC
  Administered 2023-03-12: 1100 [IU]/h via INTRAVENOUS
  Filled 2023-03-12: qty 250

## 2023-03-12 MED ORDER — CIPROFLOXACIN IN D5W 400 MG/200ML IV SOLN
400.0000 mg | INTRAVENOUS | Status: DC
  Administered 2023-03-12 – 2023-03-14 (×3): 400 mg via INTRAVENOUS
  Filled 2023-03-12 (×3): qty 200

## 2023-03-12 MED ORDER — AMLODIPINE BESYLATE 5 MG PO TABS
5.0000 mg | ORAL_TABLET | Freq: Every day | ORAL | Status: DC
  Administered 2023-03-13 – 2023-03-14 (×2): 5 mg via ORAL
  Filled 2023-03-12 (×2): qty 1

## 2023-03-12 NOTE — Progress Notes (Signed)
[facility]    SUBJECTIVE: States to be doing reasonably well still has some mild cognitive deficits but improving no focal physical or neurological deficits waxing and waning blood pressure no chest pain still bradycardic   Vitals:   03/12/23 0630 03/12/23 0645 03/12/23 0700 03/12/23 0710  BP: (!) 157/29 (!) 157/32 (!) 147/32   Pulse: (!) 49 (!) 49 (!) 48   Resp: 17 17 17    Temp:    97.7 F (36.5 C)  TempSrc:    Oral  SpO2: 98% 99% 99%   Weight:      Height:         Intake/Output Summary (Last 24 hours) at 03/12/2023 0813 Last data filed at 03/12/2023 1610 Gross per 24 hour  Intake 1510.78 ml  Output 775 ml  Net 735.78 ml      PHYSICAL EXAM  General: Well developed, well nourished, in no acute distress HEENT:  Normocephalic and atramatic Neck:  No JVD.  Lungs: Clear bilaterally to auscultation and percussion. Heart: HRRR . Normal S1 and S2 without gallops or murmurs.  Abdomen: Bowel sounds are positive, abdomen soft and non-tender  Msk:  Back normal, normal gait. Normal strength and tone for age. Extremities: No clubbing, cyanosis or edema.   Neuro: Alert and oriented X 3. Psych:  Good affect, responds appropriately   LABS: Basic Metabolic Panel: Recent Labs    03/10/23 0453 03/11/23 0417  NA 136 135  K 3.8 3.7  CL 106 104  CO2 21* 23  GLUCOSE 121* 128*  BUN 50* 61*  CREATININE 2.38* 2.64*  CALCIUM 8.5* 8.2*  MG  --  2.2   Liver Function Tests: No results for input(s): "AST", "ALT", "ALKPHOS", "BILITOT", "PROT", "ALBUMIN" in the last 72 hours. No results for input(s): "LIPASE", "AMYLASE" in the last 72 hours. CBC: Recent Labs    03/11/23 0417  WBC 17.7*  HGB 11.5*  HCT 34.3*  MCV 92.2  PLT 190   Cardiac Enzymes: No results for input(s): "CKTOTAL", "CKMB", "CKMBINDEX", "TROPONINI" in the last 72 hours. BNP: Invalid input(s): "POCBNP" D-Dimer: No results for input(s): "DDIMER" in the last 72 hours. Hemoglobin A1C: No results for  input(s): "HGBA1C" in the last 72 hours. Fasting Lipid Panel: Recent Labs    03/12/23 0511  TRIG 76   Thyroid Function Tests: No results for input(s): "TSH", "T4TOTAL", "T3FREE", "THYROIDAB" in the last 72 hours.  Invalid input(s): "FREET3" Anemia Panel: No results for input(s): "VITAMINB12", "FOLATE", "FERRITIN", "TIBC", "IRON", "RETICCTPCT" in the last 72 hours.  US RENAL  Result Date: 03/11/2023 CLINICAL DATA:  6274 Acute kidney failure, unspecified (HCC) [id number] Chronic kidney disease (CKD) stage G3a/A1, moderately decreased glomerular filtration rate (GFR) between 45-59 mL/min EXAM: RENAL / URINARY TRACT ULTRASOUND COMPLETE COMPARISON:  03/08/2023 FINDINGS: Right Kidney: Renal measurements: 9.6 x 4.3 x 5.2 cm = volume: 109 mL. Parenchyma is hypoechoic compared to adjacent liver. 1.6 x 1.5 cm cortical cyst partially exophytic mid kidney. No hydronephrosis. Left Kidney: Renal measurements: 10.6 x 5.2 x 5.2 cm = volume: 152 mL. 2.3 x 1.7 x 2 cm simple cyst mid kidney. No hydronephrosis. Bladder: Physiologically distended. Other: None. IMPRESSION: 1. No hydronephrosis. 2. Bilateral simple renal cysts. Electronically Signed   By: Corlis Leak M.D.   On: 03/11/2023 16:20     Echo preserved left ventricular function EF of at least 50 to 55%  TELEMETRY: Bradycardia chronic stable:  ASSESSMENT AND PLAN:  Principal Problem:   Malignant hypertension Peripheral vascular disease CVA acute  Renal insufficiency Altered mental status Hyperlipidemia Bradycardia  Plan Continue aggressive ICU level management and care Systolic blood pressure management and control try to keep systolic blood pressure under 161 and above 140 Anticoagulation with heparin for now Continue statin therapy Agree with nephrology input for renal insufficiency will need renal artery stenosis evaluation Post CVA management aspirin Plavix Will need monitor to rule out atrial fibrillation as underlying etiology  current Currently recommend continue statin therapy Patient will probably need rehab    Alwyn Pea, MD 03/12/2023 8:13 AM

## 2023-03-12 NOTE — Progress Notes (Signed)
[facility]  Daily Progress Note   Subjective  -   Patient is somewhat somnolent this morning.  Did get pain medication earlier today.  Has been having right arm pain.  Objective Vitals:   03/12/23 1000 03/12/23 1100 03/12/23 1127 03/12/23 1200  BP: (!) 128/42 (!) 120/40  (!) 143/49  Pulse: (!) 51 (!) 48  (!) 47  Resp: 16 15  15   Temp:   (!) 97.4 F (36.3 C)   TempSrc:   Oral   SpO2: 100% 100%  99%  Weight:      Height:        Intake/Output Summary (Last 24 hours) at 03/12/2023 1425 Last data filed at 03/12/2023 1414 Gross per 24 hour  Intake 890.67 ml  Output 525 ml  Net 365.67 ml    PULM  CTAB CV  bradycardic   Laboratory CBC    Component Value Date/Time   WBC 12.8 (H) 03/12/2023 0855   HGB 11.2 (L) 03/12/2023 0855   HGB 12.5 01/31/2022 1054   HCT 34.5 (L) 03/12/2023 0855   HCT 37.8 01/31/2022 1054   PLT 187 03/12/2023 0855   PLT 214 01/31/2022 1054    BMET    Component Value Date/Time   NA 134 (L) 03/12/2023 0507   NA 143 02/02/2023 1114   K 3.7 03/12/2023 0507   CL 104 03/12/2023 0507   CO2 21 (L) 03/12/2023 0507   GLUCOSE 124 (H) 03/12/2023 0507   BUN 65 (H) 03/12/2023 0507   BUN 16 02/02/2023 1114   CREATININE 3.37 (H) 03/12/2023 0507   CALCIUM 7.8 (L) 03/12/2023 0507   GFRNONAA 13 (L) 03/12/2023 0507   GFRAA 60 01/22/2020 1140    Assessment/Planning:   Carotid stenosis.  Carotid endarterectomy has been put on hold secondary to her hypertension, cardiac, and renal issues.  At some point, carotid endarterectomy on the left will need to be done but this is likely going to have to be in a few weeks when her renal function has returned and her blood pressure is more stable at a lower level.  Significant risk of bleeding if her perioperative blood pressure is in the 170-180 or greater range. Acute kidney injury.  Has had no contrast exposure.  Nephrology is following.  Foley catheter to be placed to monitor urine  output. Elevated troponins.  Cardiology is following.  To start anticoagulation today.  Renal function would preclude a cardiac catheterization at this time.  Festus Barren  03/12/2023, 2:25 PM

## 2023-03-12 NOTE — Progress Notes (Signed)
NAME:  Karina Leonard, MRN:  [id number], DOB:  12/02/1940, LOS: 4 ADMISSION DATE:  03/08/2023, CONSULTATION DATE: 03/08/23 REFERRING MD: Dr. Wyn Quaker, CHIEF COMPLAINT: Hypotension    History of Present Illness:  This is an 82 yo female with carotid artery stenosis who presented for an elective left carotid endarterectomy per vascular surgery on 05/16.  However, procedure canceled due to severe hypertension sbp 200's.  Pt subsequently admitted to the stepdown unit per vascular surgery and nitroglycerin gtt initiated for bp control.  Pt also restarted on her outpatient antihypertensives.  However, she remained hypertensive and nicardipine gtt added.  Pt later developed symptomatic hypotension sbp 90's to low 100's.  According to pts daughters pts baseline sbp 140-160's.  PCCM team consulted and levophed gtt initiated.    Pertinent  Medical History  Aortic Atherosclerosis  Atrial Fibrillation with Slow Ventricular Response  Bilateral Carotid Artery Disease  Bradycardia  CAD Degenerative Disc Disease  Depression  Diastolic Dysfunction  HLD  HTN Lipoma  Mild Cognitive Impairment  Pre-Diabetes  Sciatica TIA  Urinary Incontinence   Significant Hospital Events: Including procedures, antibiotic start and stop dates in addition to other pertinent events   05/16: Pt initially admitted to the stepdown unit with hypertensive urgency and later developed symptomatic hypotension secondary to antihypertensive medication requiring levophed gtt PCCM team consulted to assist with management  05/18: Pt had another episode of accelerated hypertension we have reviewed            medications, cleviprex gtt started overnight. Patient seems to have developed hydralazine        tolerance with rebound effect. Have increased HCTZ to 50 from 25.  Have met with        cardiology - Dr Juliann Pares , amlodipine 5 bid has been initiated. Will consider additional        therapy as needed while weaning down cleviprex gtt.  Her  Chronic renal impairment        seems to have acute component with oliguric aki, will get nephrology to evaluate.  05/19: Pt in no distress this am. BP still elevated and still requires cleviprex titration with the PO regimen. Nephrology restarted clonidine due to concern of rebound hypertension following discontinuation of outpatient clonidine patch  05/20: Cleviprex gtt currently off.  SBP improved significantly now 130-150.  Pt now with increasing troponin [phone] Cardiology following and aware  Micro Data:   Urine 05/20>>  Antimicrobials:    Anti-infectives (From admission, onward)    Start     Dose/Rate Route Frequency Ordered Stop   03/12/23 0900  ciprofloxacin (CIPRO) IVPB 400 mg        400 mg 200 mL/hr over 60 Minutes Intravenous Every 24 hours 03/12/23 0812     03/08/23 0600  vancomycin (VANCOCIN) IVPB 1000 mg/200 mL premix        1,000 mg 200 mL/hr over 60 Minutes Intravenous On call to O.R. 03/08/23 0018 03/08/23 0855      Interim History / Subjective:  As outlined above under significant events   Objective   Blood pressure (!) 147/32, pulse (!) 48, temperature 97.7 F (36.5 C), temperature source Oral, resp. rate 17, height 5\' 7"  (1.702 m), weight 88.1 kg, SpO2 99 %.        Intake/Output Summary (Last 24 hours) at 03/12/2023 0747 Last data filed at 03/12/2023 0634 Gross per 24 hour  Intake 1526.76 ml  Output 775 ml  Net 751.76 ml   Filed Weights   03/08/23 1478  03/08/23 0841  Weight: 87.5 kg 88.1 kg    Examination: General: Acutely-ill appearing female, NAD on RA  HENT: Supple, no JVD  Lungs: Clear throughout, even, non labored  Cardiovascular: Sinus bradycardia, no m/r/g, 2+ radial/1+ distal pulses, no edema  Abdomen: +BS x4, obese, soft, non tender  Extremities: Normal bulk and tone, moves all extremities  Neuro: Alert and oriented, followings commands, PERRLA GU: External catheter   Resolved Hospital Problem list   Hypotension   Assessment &  Plan:  #Hypertension urgency~improving  #Elevated troponin's secondary to demand ischemia vs. NSTEMI  Hx: Bradycardia, HLD, CAD, atrial fibrillation with slow ventricular response  Echo 01/08/23: EF >55%; mild MR/TR; trivial PR; moderate LVH - Continuous telemetry monitoring  - TSH 3.217/Free T4 1.00 - Follow EKG's  - Trend troponin's until peaked (28~353~2,755~) - Cardiology consulted appreciate input  - Continue amlodipine and clonidine - Continue outpatient asprin and statin   #Bilateral carotid stenosis (50% right common carotid artery stenosis/60% right internal carotid stenosis with a 70% left common carotid artery stenosis) - Vascular surgery consulted appreciate input: once stabilized will proceed with left carotid endarterectomy  #Acute kidney injury on chronic kidney disease stage IIIA secondary to hypertensive urgency (baseline creatinine of 1.17, GFR of 47 on 02/27/2023)~worsening  US Renal Artery Duplex Limited 03/08/23: No Doppler ultrasound evidence of hemodynamically significant renal artery stenosis. If there is continued clinical concern, renal MRA (lower radiation risk, can be performed noncontrast in the setting of renal dysfunction) and CTA ( higher spatial resolution) represent more accurate studies, which are additionally more sensitive to the detection of duplicated renal arteries. - Trend BMP  - Replace electrolytes as indicated  - Monitor UOP - Avoid nephrotoxic medications when able  - Nephrology consulted appreciate input   #Possible UTI  - Trend WBC and monitor fever curve  - Trend PCT  - Follow urine culture  - Continue abx as outlined above pending culture results and sensitivities   #Chronic diarrhea  #GERD  - Continue PPI - Prn imodium   #Prediabetes  Hemoglobin A1c 03/08/23: 5.7 - CBG's ac/hs  - SSI   Best Practice (right click and "Reselect all SmartList Selections" daily)   Diet/type: Regular consistency (see orders) DVT prophylaxis:  LMWH GI prophylaxis: PPI Lines: N/A Foley:  N/A Code Status:  full code Last date of multidisciplinary goals of care discussion [03/12/23]  05/20: Updated pt regarding current plan of care and answered all questions.  Will update pts daughters when they arrive at bedside  Labs   CBC: Recent Labs  Lab 03/08/23 1043 03/08/23 1437 03/09/23 0406 03/11/23 0417  WBC 13.2* 19.6* 20.6* 17.7*  NEUTROABS  --  17.4*  --   --   HGB 11.4* 11.9* 11.9* 11.5*  HCT 34.4* 36.0 35.5* 34.3*  MCV 91.7 92.1 92.2 92.2  PLT 199 172 204 190    Basic Metabolic Panel: Recent Labs  Lab 03/08/23 1436 03/08/23 1437 03/09/23 0406 03/10/23 0453 03/11/23 0417  NA  --  137 138 136 135  K  --  4.4 4.5 3.8 3.7  CL  --  108 107 106 104  CO2  --  16* 23 21* 23  GLUCOSE  --  306* 125* 121* 128*  BUN  --  28* 38* 50* 61*  CREATININE  --  1.74* 2.38* 2.38* 2.64*  CALCIUM  --  7.9* 8.2* 8.5* 8.2*  MG  --   --  1.6*  --  2.2  PHOS 4.9*  --  3.2  --   --    GFR: Estimated Creatinine Clearance: 19.4 mL/min (A) (by C-G formula based on SCr of 2.64 mg/dL (H)). Recent Labs  Lab 03/08/23 1043 03/08/23 1437 03/09/23 0406 03/11/23 0417  WBC 13.2* 19.6* 20.6* 17.7*    Liver Function Tests: Recent Labs  Lab 03/08/23 1437 03/09/23 0406  AST 48* 82*  ALT 15 37  ALKPHOS 77 84  BILITOT 1.0 0.9  PROT 5.9* 6.5  ALBUMIN 3.1* 3.4*   No results for input(s): "LIPASE", "AMYLASE" in the last 168 hours. No results for input(s): "AMMONIA" in the last 168 hours.  ABG No results found for: "PHART", "PCO2ART", "PO2ART", "HCO3", "TCO2", "ACIDBASEDEF", "O2SAT"   Coagulation Profile: No results for input(s): "INR", "PROTIME" in the last 168 hours.  Cardiac Enzymes: No results for input(s): "CKTOTAL", "CKMB", "CKMBINDEX", "TROPONINI" in the last 168 hours.  HbA1C: Hgb A1c MFr Bld  Date/Time Value Ref Range Status  03/08/2023 05:56 PM 5.7 (H) 4.8 - 5.6 % Final    Comment:    (NOTE) Pre diabetes:           5.7%-6.4%  Diabetes:              >6.4%  Glycemic control for   <7.0% adults with diabetes   02/02/2023 11:14 AM 6.0 (H) 4.8 - 5.6 % Final    Comment:             Prediabetes: 5.7 - 6.4          Diabetes: >6.4          Glycemic control for adults with diabetes: <7.0     CBG: Recent Labs  Lab 03/11/23 0743 03/11/23 1118 03/11/23 1609 03/11/23 2109 03/12/23 0723  GLUCAP 125* 122* 135* 119* 109*    Review of Systems: Positives in BOLD   Gen: fever, chills, weight change, fatigue, night sweats HEENT: Denies blurred vision, double vision, hearing loss, tinnitus, sinus congestion, rhinorrhea, sore throat, neck stiffness, dysphagia PULM: shortness of breath, cough, sputum production, hemoptysis, wheezing CV: Denies chest pain, edema, orthopnea, paroxysmal nocturnal dyspnea, palpitations GI: Denies abdominal pain, nausea, vomiting, diarrhea, hematochezia, melena, constipation, change in bowel habits GU: Denies dysuria, hematuria, polyuria, oliguria, urethral discharge Endocrine: Denies hot or cold intolerance, polyuria, polyphagia or appetite change Derm: Denies rash, dry skin, scaling or peeling skin change Heme: Denies easy bruising, bleeding, bleeding gums Neuro: dizziness, headache, numbness, weakness, slurred speech, loss of memory or consciousness   Past Medical History:  She,  has a past medical history of Allergy, Aortic atherosclerosis (HCC), Atrial fibrillation with slow ventricular response (HCC) (02/27/2023), Bilateral carotid artery disease (HCC), Bradycardia, Cataract cortical, senile, Cerebrovascular disease, Coronary artery disease involving native coronary artery of native heart without angina pectoris, DDD (degenerative disc disease), lumbar, Depression, Diastolic dysfunction, Full dentures, Hyperlipidemia, Hypertension, Keloid, Lipoma, Long term current use of antithrombotics/antiplatelets, Mild cognitive impairment, Pre-diabetes, Sciatica, Stroke (HCC), TIA  (transient ischemic attack), and Urinary incontinence.   Surgical History:   Past Surgical History:  Procedure Laterality Date   CHOLECYSTECTOMY  2009   COLONOSCOPY     RECTAL POLYPECTOMY     benign     Social History:   reports that she quit smoking about 57 years ago. Her smoking use included cigarettes. She has a 20.00 pack-year smoking history. She has never used smokeless tobacco. She reports current alcohol use. She reports that she does not use drugs.   Family History:  Her family history includes Cancer in her father and  mother; Heart attack in her sister.   Allergies Allergies  Allergen Reactions   Amoxicillin Hives   Donepezil Diarrhea   Nicardipine Hcl In Dextrose Other (See Comments)    Patient vasovagal response on low dose cardene   Penicillins Hives     Home Medications  Prior to Admission medications   Medication Sig Start Date End Date Taking? Authorizing Provider  aspirin EC 81 MG tablet Take 81 mg by mouth daily. Take while off of the plavix for surgey   Yes [provider]  cloNIDine (CATAPRES - DOSED IN MG/24 HR) 0.2 mg/24hr patch Place 0.2 mg onto the skin once a week. Sunday   Yes [provider]  fexofenadine (ALLEGRA) 180 MG tablet Take 1 tablet (180 mg total) by mouth daily. otc 07/22/18  Yes Duanne Limerick, MD  fluticasone (FLONASE) 50 MCG/ACT nasal spray Place 1 spray into both nostrils daily. otc   Yes [provider]  furosemide (LASIX) 20 MG tablet Take 20 mg by mouth daily. [place] 01/18/23  Yes [provider]  losartan-hydrochlorothiazide (HYZAAR) 100-25 MG tablet Take 1 tablet by mouth daily. 02/02/23  Yes Duanne Limerick, MD  lovastatin (MEVACOR) 20 MG tablet TAKE 2 TABLETS EVERY DAY Patient taking differently: Take 2 tablets by mouth at bedtime. TAKE 2 TABLETS EVERY DAY 02/02/23  Yes Duanne Limerick, MD  metoprolol succinate (TOPROL-XL) 50 MG 24 hr tablet Take 1 tablet (50 mg total) by mouth daily. [place]  05/25/22  Yes Duanne Limerick, MD  venlafaxine XR (EFFEXOR-XR) 75 MG 24 hr capsule Take 1 capsule (75 mg total) by mouth daily. Patient taking differently: Take 75 mg by mouth at bedtime. 02/02/23  Yes Duanne Limerick, MD  clopidogrel (PLAVIX) 75 MG tablet Take 1 tablet (75 mg total) by mouth daily. 02/02/23   Duanne Limerick, MD     Critical care time: 40 minutes     Zada Girt, AGNP  Pulmonary/Critical Care Pager [phone] (please enter 7 digits) PCCM Consult Pager [phone] (please enter 7 digits) a

## 2023-03-12 NOTE — Plan of Care (Signed)
  Problem: Education: Goal: Knowledge of General Education information will improve Description: Including pain rating scale, medication(s)/side effects and non-pharmacologic comfort measures Outcome: Progressing   Problem: Education: Goal: Knowledge of General Education information will improve Description: Including pain rating scale, medication(s)/side effects and non-pharmacologic comfort measures Outcome: Progressing   Problem: Clinical Measurements: Goal: Will remain free from infection Outcome: Progressing   Problem: Coping: Goal: Level of anxiety will decrease Outcome: Not Applicable

## 2023-03-12 NOTE — Progress Notes (Signed)
Notified by Annabelle Harman, NP that she wanted code stroke called d/t pt having some right sided weakness and facial droop. Pt's daughter also made Annabelle Harman, NP aware that pt was having a difficult time finding words. Code stroke activated at 1614. Pt taken to CT by this RN at 1629. Tresa Endo, stroke coordinator, and neurologist present in CT. Pt taken back up to ICU 7 with no s/s of distress. Family also at bedside.

## 2023-03-12 NOTE — Progress Notes (Signed)
   03/12/23 1600  Spiritual Encounters  Type of Visit Attempt (pt unavailable)  Referral source Trauma page;Code page  Reason for visit Code  OnCall Visit No  Spiritual Framework  Patient Stress Factors Not reviewed  Family Stress Factors Not reviewed  Spiritual Care Plan  Spiritual Care Issues Still Outstanding Referring to oncoming chaplain for further support   Responded to code stroke page. Unable to see patient family members were consoling each other. Let them know a chaplain is here 24/7 if they every needed one.

## 2023-03-12 NOTE — Progress Notes (Signed)
ANTICOAGULATION CONSULT NOTE  Pharmacy Consult for heparin infusion Indication: chest pain/ACS  Allergies  Allergen Reactions   Amoxicillin Hives   Donepezil Diarrhea   Nicardipine Hcl In Dextrose Other (See Comments)    Patient vasovagal response on low dose cardene   Penicillins Hives    Patient Measurements: Height: 5\' 7"  (170.2 cm) Weight: 88.1 kg (194 lb 3.6 oz) IBW/kg (Calculated) : 61.6 Heparin Dosing Weight: 80.2 kg  Vital Signs: Temp: 97.4 F (36.3 C) (05/20 1127) Temp Source: Oral (05/20 1127) BP: 120/40 (05/20 1100) Pulse Rate: 48 (05/20 1100)  Labs: Recent Labs    03/10/23 0453 03/11/23 0417 03/12/23 0507 03/12/23 0855 03/12/23 0857 03/12/23 1102  HGB  --  11.5*  --  11.2*  --   --   HCT  --  34.3*  --  34.5*  --   --   PLT  --  190  --  187  --   --   CREATININE 2.38* 2.64* 3.37*  --   --   --   TROPONINIHS  --   --  2,765*  --  3,149* 2,667*    Estimated Creatinine Clearance: 15.2 mL/min (A) (by C-G formula based on SCr of 3.37 mg/dL (H)).   Medical History: Past Medical History:  Diagnosis Date   Allergy    Aortic atherosclerosis (HCC)    Atrial fibrillation with slow ventricular response (HCC) 02/27/2023   a.) noted on preoperative ECG 02/27/2023; A.fib at 52 bpm with LVH and (+) inferolateral TWIs; b.) CHA2DS2VASc = 7 (age x 2, sex, HTN, CVA/TIA x2, vascular disease history); c.) rate/rhythm maintained on oral metoprolol; chronically antithrombotic therapy (ASA + clopidogrel)   Bilateral carotid artery disease (HCC)    a.) doppler 01/25/2023: 60% RICA, 70% LICA   Bradycardia    Cataract cortical, senile    Cerebrovascular disease    Coronary artery disease involving native coronary artery of native heart without angina pectoris    a.) MPI 05/24/2020: EF 45-50%. Borderline ant/lat defect with border zone redistribution; b.) MPI 01/08/2023: EF 45%; mild-mod mixed ant apical defect c/w infarct vs. ischemia   DDD (degenerative disc disease),  lumbar    Depression    Diastolic dysfunction    a.) TTE 01/08/2023: EF >55%, mod LVH, sev LAE, mild MR/TR/PR, G1DD   Full dentures    Hyperlipidemia    Hypertension    Keloid    Lipoma    Long term current use of antithrombotics/antiplatelets    a.) on DAPT (ASA + clopidogrel)   Mild cognitive impairment    Pre-diabetes    Sciatica    Stroke [facility])    a.) MRI brain 03/19/2020: multiple old small vessel infarcts of the basal ganglia, thalamus and pons   TIA (transient ischemic attack)    Urinary incontinence     Medications:  Scheduled:   [START ON 03/13/2023] amLODipine  5 mg Oral Daily   aspirin EC  81 mg Oral Daily   Chlorhexidine Gluconate Cloth  6 each Topical Daily   cloNIDine  0.2 mg Oral TID   docusate sodium  100 mg Oral BID   fluticasone  1 spray Each Nare Daily   insulin aspart  0-5 Units Subcutaneous QHS   insulin aspart  0-9 Units Subcutaneous TID WC   loratadine  10 mg Oral Daily   pantoprazole  40 mg Oral Daily   pravastatin  40 mg Oral q1800   venlafaxine XR  75 mg Oral QHS    Assessment: 82yoF  with a PMH of HTN, bilateral carotid stenosis (70% RICA, 80% LICA), hx CVA/TIA, HFpEF (55%), abnormal lexiscan myoview (01/08/23), sinus arrhythmia who presented to [facility] for elective left carotid endarterectomy with vascular surgery which was cancelled for elevated BP. No chronic anticoagulation PTA   Goal of Therapy:  Heparin level 0.3-0.7 units/ml Monitor platelets by anticoagulation protocol: Yes   Plan:  Give 4000 units bolus x 1 Start heparin infusion at 1100 units/hr Check anti-Xa level in 8 hours and daily while on heparin Continue to monitor H&H and platelets  Lowella Bandy 03/12/2023,11:57 AM

## 2023-03-12 NOTE — Consult Note (Signed)
Neurology Consultation Reason for Consult: difficulty speaking Referring Physician: Belia Heman, K  CC: Difficulty speaking  History is obtained from: Patient, daughter  HPI: Karina Leonard is a 82 y.o. female with a history of possible atrial fibrillation although there has been some disagreement who presented for an elective left endarterectomy and was found to have extremely elevated blood pressures.  She was admitted to the ICU and started on nitro drip, then switched to Cardene and had her blood pressure upon to the 90s systolic.  She was poorly responsive and developed a junctional bradycardia.  Over the past few days, it is a noted that she attends difficulty with word finding but the daughters noticed that it was possibly slightly worse today and mentioned it to the nurse.  After mentioning it to the nurse, a code stroke was activated.   LKW: Thursday tnk given?: no, outside of window   Past Medical History:  Diagnosis Date   Allergy    Aortic atherosclerosis (HCC)    Atrial fibrillation with slow ventricular response (HCC) 02/27/2023   a.) noted on preoperative ECG 02/27/2023; A.fib at 52 bpm with LVH and (+) inferolateral TWIs; b.) CHA2DS2VASc = 7 (age x 2, sex, HTN, CVA/TIA x2, vascular disease history); c.) rate/rhythm maintained on oral metoprolol; chronically antithrombotic therapy (ASA + clopidogrel)   Bilateral carotid artery disease (HCC)    a.) doppler 01/25/2023: 60% RICA, 70% LICA   Bradycardia    Cataract cortical, senile    Cerebrovascular disease    Coronary artery disease involving native coronary artery of native heart without angina pectoris    a.) MPI 05/24/2020: EF 45-50%. Borderline ant/lat defect with border zone redistribution; b.) MPI 01/08/2023: EF 45%; mild-mod mixed ant apical defect c/w infarct vs. ischemia   DDD (degenerative disc disease), lumbar    Depression    Diastolic dysfunction    a.) TTE 01/08/2023: EF >55%, mod LVH, sev LAE, mild MR/TR/PR, G1DD    Full dentures    Hyperlipidemia    Hypertension    Keloid    Lipoma    Long term current use of antithrombotics/antiplatelets    a.) on DAPT (ASA + clopidogrel)   Mild cognitive impairment    Pre-diabetes    Sciatica    Stroke [facility])    a.) MRI brain 03/19/2020: multiple old small vessel infarcts of the basal ganglia, thalamus and pons   TIA (transient ischemic attack)    Urinary incontinence      Family History  Problem Relation Age of Onset   Cancer Mother        uterine or ovarian. Unable to remember   Cancer Father        lung   Heart attack Sister      Social History:  reports that she quit smoking about 57 years ago. Her smoking use included cigarettes. She has a 20.00 pack-year smoking history. She has never used smokeless tobacco. She reports current alcohol use. She reports that she does not use drugs.   Exam: Current vital signs: BP (!) 157/52   Pulse (!) 47   Temp 97.6 F (36.4 C) (Oral)   Resp 15   Ht 5\' 7"  (1.702 m)   Wt 88.1 kg   SpO2 (!) 88%   BMI 30.42 kg/m  Vital signs in last 24 hours: Temp:  [97.4 F (36.3 C)-98.6 F (37 C)] 97.6 F (36.4 C) (05/20 1700) Pulse Rate:  [47-59] 47 (05/20 1800) Resp:  [13-22] 15 (05/20 1800) BP: (111-201)/(29-60) 157/52 (  05/20 1800) SpO2:  [88 %-100 %] 88 % (05/20 1800)   Physical Exam  Appears well-developed and well-nourished.   Neuro: Mental Status: Patient is awake, alert, oriented to person, place, month, year, and situation. Patient is able to give a clear and coherent history. No signs of neglect, she does have difficulty with word finding Cranial Nerves: II: Visual Fields are full. Pupils are equal, round, and reactive to light.   III,IV, VI: EOMI without ptosis or diploplia.  V: Facial sensation is symmetric to temperature VII: Facial movement is asymmetric, there is a lag on the left compared to right VIII: hearing is intact to voice X: Uvula elevates symmetrically XI: Shoulder shrug is  symmetric. XII: tongue is midline without atrophy or fasciculations.  Motor: Tone is normal. Bulk is normal. 5/5 strength was present in all four extremities.  Sensory: Sensation is symmetric to light touch and temperature in the arms and legs. Cerebellar Finger-nose-finger is intact     I have reviewed labs in epic and the results pertinent to this consultation are:   I have reviewed the images obtained: CT head-left temporal stroke  Impression: 82 year old female with left temporal stroke.  Etiology could be due to her precipitous blood pressure drop in the setting of known underlying carotid stenosis versus embolization from her carotid versus due to atrial fibrillation.  An MRI might provide more information, and review an MRA at same time.  Recommendations: - HgbA1c, fasting lipid panel - MRI of the brain without contrast - MRA head and neck - I would favor stopping heparin for now pending MRI - Prophylactic therapy-Antiplatelet med: Aspirin - dose 81mg  - Risk factor modification - Telemetry monitoring - PT consult, OT consult, Speech consult   Ritta Slot, MD [facility] [phone]  If 7pm- 7am, please page neurology on call as listed in AMION.

## 2023-03-12 NOTE — Progress Notes (Signed)
[facility] CLINIC CARDIOLOGY CONSULT NOTE       Patient ID: Karina Leonard MRN: [id number] DOB/AGE: 1941/01/22 82 y.o.  Admit date: 03/08/2023 Referring Physician Zada Girt, NP  Primary Physician Dr. Elizabeth Sauer Primary Cardiologist Dr. Juliann Pares Reason for Consultation labile BP, junctional bradycardia   HPI: Karina Leonard is an 24yoF with a PMH of HTN, bilateral carotid stenosis (70% RICA, 80% LICA), hx CVA/TIA, HFpEF (55%), abnormal lexiscan myoview (01/08/23), sinus arrhythmia who presented to [facility] for elective left carotid endarterectomy with vascular surgery. The patient's BP was extremely elevated and procedure was cancelled and was admitted for BP control.  Cardiology is consulted for further assistance.  Interval History:  -Patient reports she is feeling well and denies any chest pain, SOB, nausea, palpitations, dizziness.  -Troponin repeated this AM 2667 < 3149 < 2765. Initial 353 on 5/17.  -Bump in Cr this AM to 3.37 from 2.64 yesterday  Review of systems complete and found to be negative unless listed above    Past Medical History:  Diagnosis Date   Allergy    Aortic atherosclerosis (HCC)    Atrial fibrillation with slow ventricular response (HCC) 02/27/2023   a.) noted on preoperative ECG 02/27/2023; A.fib at 52 bpm with LVH and (+) inferolateral TWIs; b.) CHA2DS2VASc = 7 (age x 2, sex, HTN, CVA/TIA x2, vascular disease history); c.) rate/rhythm maintained on oral metoprolol; chronically antithrombotic therapy (ASA + clopidogrel)   Bilateral carotid artery disease (HCC)    a.) doppler 01/25/2023: 60% RICA, 70% LICA   Bradycardia    Cataract cortical, senile    Cerebrovascular disease    Coronary artery disease involving native coronary artery of native heart without angina pectoris    a.) MPI 05/24/2020: EF 45-50%. Borderline ant/lat defect with border zone redistribution; b.) MPI 01/08/2023: EF 45%; mild-mod mixed ant apical defect c/w infarct vs. ischemia    DDD (degenerative disc disease), lumbar    Depression    Diastolic dysfunction    a.) TTE 01/08/2023: EF >55%, mod LVH, sev LAE, mild MR/TR/PR, G1DD   Full dentures    Hyperlipidemia    Hypertension    Keloid    Lipoma    Long term current use of antithrombotics/antiplatelets    a.) on DAPT (ASA + clopidogrel)   Mild cognitive impairment    Pre-diabetes    Sciatica    Stroke [facility])    a.) MRI brain 03/19/2020: multiple old small vessel infarcts of the basal ganglia, thalamus and pons   TIA (transient ischemic attack)    Urinary incontinence     Past Surgical History:  Procedure Laterality Date   CHOLECYSTECTOMY  2009   COLONOSCOPY     RECTAL POLYPECTOMY     benign    Medications Prior to Admission  Medication Sig Dispense Refill Last Dose   aspirin EC 81 MG tablet Take 81 mg by mouth daily. Take while off of the plavix for surgey   03/07/2023   cloNIDine (CATAPRES - DOSED IN MG/24 HR) 0.2 mg/24hr patch Place 0.2 mg onto the skin once a week. Sunday   Past Week   fexofenadine (ALLEGRA) 180 MG tablet Take 1 tablet (180 mg total) by mouth daily. otc 90 tablet 1 Past Week   fluticasone (FLONASE) 50 MCG/ACT nasal spray Place 1 spray into both nostrils daily. otc   Past Week   furosemide (LASIX) 20 MG tablet Take 20 mg by mouth daily. callwood   Past Week   losartan-hydrochlorothiazide (HYZAAR) 100-25 MG tablet  Take 1 tablet by mouth daily. 90 tablet 1 03/07/2023   lovastatin (MEVACOR) 20 MG tablet TAKE 2 TABLETS EVERY DAY (Patient taking differently: Take 2 tablets by mouth at bedtime. TAKE 2 TABLETS EVERY DAY) 180 tablet 1 03/07/2023   metoprolol succinate (TOPROL-XL) 50 MG 24 hr tablet Take 1 tablet (50 mg total) by mouth daily. [place]wood 90 tablet 0 03/08/2023 at 0500   venlafaxine XR (EFFEXOR-XR) 75 MG 24 hr capsule Take 1 capsule (75 mg total) by mouth daily. (Patient taking differently: Take 75 mg by mouth at bedtime.) 90 capsule 1 03/07/2023   clopidogrel (PLAVIX) 75 MG tablet Take  1 tablet (75 mg total) by mouth daily. 90 tablet 1 03/01/2023   Social History   Socioeconomic History   Marital status: Widowed    Spouse name: Not on file   Number of children: 3   Years of education: some college   Highest education level: 12th grade  Occupational History   Occupation: Retired  Tobacco Use   Smoking status: Former    Packs/day: 1.00    Years: 20.00    Additional pack years: 0.00    Total pack years: 20.00    Types: Cigarettes    Quit date: 56    Years since quitting: 57.4   Smokeless tobacco: Never  Vaping Use   Vaping Use: Never used  Substance and Sexual Activity   Alcohol use: Yes    Alcohol/week: 0.0 standard drinks of alcohol    Comment: special occasions   Drug use: No   Sexual activity: Never  Other Topics Concern   Not on file  Social History Narrative   Lives alone   Social Determinants of Health   Financial Resource Strain: Low Risk  (03/22/2022)   Overall Financial Resource Strain (CARDIA)    Difficulty of Paying Living Expenses: Not hard at all  Food Insecurity: No Food Insecurity (03/08/2023)   Hunger Vital Sign    Worried About Running Out of Food in the Last Year: Never true    Ran Out of Food in the Last Year: Never true  Transportation Needs: No Transportation Needs (03/08/2023)   PRAPARE - Administrator, Civil Service (Medical): No    Lack of Transportation (Non-Medical): No  Physical Activity: Insufficiently Active (03/22/2022)   Exercise Vital Sign    Days of Exercise per Week: 3 days    Minutes of Exercise per Session: 30 min  Stress: No Stress Concern Present (03/22/2022)   [employer] of Occupational Health - Occupational Stress Questionnaire    Feeling of Stress : Not at all  Social Connections: Moderately Isolated (03/22/2022)   Social Connection and Isolation Panel [NHANES]    Frequency of Communication with Friends and Family: More than three times a week    Frequency of Social Gatherings with Friends  and Family: Twice a week    Attends Religious Services: More than 4 times per year    Active Member of [employer] or Organizations: No    Attends Banker Meetings: Never    Marital Status: Widowed  Intimate Partner Violence: Not At Risk (03/08/2023)   Humiliation, Afraid, Rape, and Kick questionnaire    Fear of Current or Ex-Partner: No    Emotionally Abused: No    Physically Abused: No    Sexually Abused: No    Family History  Problem Relation Age of Onset   Cancer Mother        uterine or ovarian. Unable to remember  Cancer Father        lung   Heart attack Sister       Intake/Output Summary (Last 24 hours) at 03/12/2023 1441 Last data filed at 03/12/2023 1414 Gross per 24 hour  Intake 890.67 ml  Output 525 ml  Net 365.67 ml    Vitals:   03/12/23 1000 03/12/23 1100 03/12/23 1127 03/12/23 1200  BP: (!) 128/42 (!) 120/40  (!) 143/49  Pulse: (!) 51 (!) 48  (!) 47  Resp: 16 15  15   Temp:   (!) 97.4 F (36.3 C)   TempSrc:   Oral   SpO2: 100% 100%  99%  Weight:      Height:        PHYSICAL EXAM General: Pleasant elderly black female, well nourished, in no acute distress.  Sitting upright in bedside chair with no family present.   HEENT:  Normocephalic and atraumatic. Neck:  No JVD.  Lungs: Normal respiratory effort on room air. Clear bilaterally to auscultation. No wheezes, crackles, rhonchi.  Heart: Bradycardic but regular. Normal S1 and S2 without gallops or murmurs.  Abdomen: Non-distended appearing.  Msk: Normal strength and tone for age. Extremities: Warm and well perfused. No clubbing, cyanosis.  No peripheral edema.  Neuro: Alert and oriented X 3. Psych:  Answers questions appropriately.   Labs: Basic Metabolic Panel: Recent Labs    03/11/23 0417 03/12/23 0507  NA 135 134*  K 3.7 3.7  CL 104 104  CO2 23 21*  GLUCOSE 128* 124*  BUN 61* 65*  CREATININE 2.64* 3.37*  CALCIUM 8.2* 7.8*  MG 2.2 2.1  PHOS  --  4.6   Liver Function  Tests: Recent Labs    03/12/23 0507  ALBUMIN 2.7*    No results for input(s): "LIPASE", "AMYLASE" in the last 72 hours. CBC: Recent Labs    03/11/23 0417 03/12/23 0855  WBC 17.7* 12.8*  HGB 11.5* 11.2*  HCT 34.3* 34.5*  MCV 92.2 93.8  PLT 190 187   Cardiac Enzymes: Recent Labs    03/12/23 0507 03/12/23 0857 03/12/23 1102  TROPONINIHS 2,765* 3,149* 2,667*   BNP: No results for input(s): "BNP" in the last 72 hours.  D-Dimer: No results for input(s): "DDIMER" in the last 72 hours. Hemoglobin A1C: No results for input(s): "HGBA1C" in the last 72 hours.  Fasting Lipid Panel: Recent Labs    03/12/23 0511  TRIG 76   Thyroid Function Tests: No results for input(s): "TSH", "T4TOTAL", "T3FREE", "THYROIDAB" in the last 72 hours.  Invalid input(s): "FREET3"  Anemia Panel: No results for input(s): "VITAMINB12", "FOLATE", "FERRITIN", "TIBC", "IRON", "RETICCTPCT" in the last 72 hours.   Radiology: US Venous Img Upper Uni Right(DVT)  Result Date: 03/12/2023 CLINICAL DATA:  Right upper extremity pain and edema. EXAM: RIGHT UPPER EXTREMITY VENOUS DOPPLER ULTRASOUND TECHNIQUE: Gray-scale sonography with graded compression, as well as color Doppler and duplex ultrasound were performed to evaluate the upper extremity deep venous system from the level of the subclavian vein and including the jugular, axillary, basilic, radial, ulnar and upper cephalic vein. Spectral Doppler was utilized to evaluate flow at rest and with distal augmentation maneuvers. COMPARISON:  None Available. FINDINGS: Contralateral Subclavian Vein: Respiratory phasicity is normal and symmetric with the symptomatic side. No evidence of thrombus. Normal compressibility. Internal Jugular Vein: No evidence of thrombus. Normal compressibility, respiratory phasicity and response to augmentation. Subclavian Vein: No evidence of thrombus. Normal compressibility, respiratory phasicity and response to augmentation. Axillary  Vein: No evidence of thrombus. Normal  compressibility, respiratory phasicity and response to augmentation. Cephalic Vein: Superficial thrombophlebitis of a segment of the right cephalic vein in the forearm with associated venous distension. Basilic Vein: No evidence of thrombus. Normal compressibility, respiratory phasicity and response to augmentation. Brachial Veins: No evidence of thrombus. Normal compressibility, respiratory phasicity and response to augmentation. Radial Veins: No evidence of thrombus. Normal compressibility, respiratory phasicity and response to augmentation. Ulnar Veins: No evidence of thrombus. Normal compressibility, respiratory phasicity and response to augmentation. Venous Reflux:  None visualized. Other Findings:  No abnormal fluid collections identified. IMPRESSION: 1. No evidence of DVT within the right upper extremity. 2. Superficial thrombophlebitis of a segment of the right cephalic vein in the forearm. Electronically Signed   By: Irish Lack M.D.   On: 03/12/2023 12:24   US RENAL  Result Date: 03/11/2023 CLINICAL DATA:  6274 Acute kidney failure, unspecified (HCC) [id number] Chronic kidney disease (CKD) stage G3a/A1, moderately decreased glomerular filtration rate (GFR) between 45-59 mL/min EXAM: RENAL / URINARY TRACT ULTRASOUND COMPLETE COMPARISON:  03/08/2023 FINDINGS: Right Kidney: Renal measurements: 9.6 x 4.3 x 5.2 cm = volume: 109 mL. Parenchyma is hypoechoic compared to adjacent liver. 1.6 x 1.5 cm cortical cyst partially exophytic mid kidney. No hydronephrosis. Left Kidney: Renal measurements: 10.6 x 5.2 x 5.2 cm = volume: 152 mL. 2.3 x 1.7 x 2 cm simple cyst mid kidney. No hydronephrosis. Bladder: Physiologically distended. Other: None. IMPRESSION: 1. No hydronephrosis. 2. Bilateral simple renal cysts. Electronically Signed   By: Corlis Leak M.D.   On: 03/11/2023 16:20   US RENAL ARTERY DUPLEX LIMITED  Result Date: 03/08/2023 CLINICAL DATA:  Malignant  hypertension EXAM: RENAL/URINARY TRACT ULTRASOUND RENAL DUPLEX DOPPLER ULTRASOUND COMPARISON:  CT 04/05/2008 FINDINGS: Right Kidney: Length: 9.8 x 4.8 x 5.3 cm (128 cc). Isoechoic to adjacent liver. 1.6 x 1.3 x 1.2 cm midpole cyst. No hydronephrosis. Left Kidney: Length: 10.6 x 5.5 x 4.7 cm (144 cc). No hydronephrosis. 2.3 x 2 cm lower pole cortical cyst. Bladder:  Not imaged RENAL DUPLEX ULTRASOUND Right Renal Artery Velocities: Origin:  43.8 cm/sec Mid:  47.6 cm/sec Hilum:  43.2 cm/sec Left Renal Artery Velocities: Origin:  70.2 cm/sec Mid:  67.8 cm/sec Hilum:  67.7 cm/sec Aortic Velocity:  141 cm/sec All renal-aortic ratios are well under 1.0. Technologist describes technically difficult study secondary to bowel gas and patient inability to hold breath. IMPRESSION: 1. No Doppler ultrasound evidence of hemodynamically significant renal artery stenosis. 2. If there is continued clinical concern, renal MRA (lower radiation risk, can be performed noncontrast in the setting of renal dysfunction) and CTA ( higher spatial resolution) represent more accurate studies, which are additionally more sensitive to the detection of duplicated renal arteries. Electronically Signed   By: Corlis Leak M.D.   On: 03/08/2023 18:26   DG Chest Port 1 View  Result Date: 03/08/2023 CLINICAL DATA:  Hypotension EXAM: PORTABLE CHEST 1 VIEW COMPARISON:  08/22/2007 FINDINGS: Single frontal view of the chest demonstrates an enlarged cardiac silhouette. There is atherosclerosis of the thoracic aorta, bilateral subclavian arteries, and bilateral carotid arteries. No acute airspace disease, effusion, or pneumothorax. No acute bony abnormalities. IMPRESSION: 1. Enlarged cardiac silhouette. 2. No acute airspace disease. 3. Extensive atherosclerosis. Electronically Signed   By: Sharlet Salina M.D.   On: 03/08/2023 14:53    ECHO  INTERPRETATION  NORMAL LEFT VENTRICULAR SYSTOLIC FUNCTION   WITH MODERATE LVH  NORMAL RIGHT VENTRICULAR SYSTOLIC  FUNCTION  MILD VALVULAR REGURGITATION (See above)  NO VALVULAR STENOSIS  IRREGULAR HEART RHYTHM CAPTURED THROUGHOUT EXAM  ESTIMATED LVEF >55%  CALCULATED: 58.3%  GLS: -16.7%  MILD MR, TR, PI  SEVERE LAE  _________________________________________________________________________________________  Electronically signed by    Dorothyann Peng, MD on 01/08/2023 04: 59 PM           Performed By: Verdis Prime     Ordering Physician: Dorothyann Peng, MD   Steffanie Dunn 01/08/2023 Impression   Moderately abnormal myocardial perfusion scan there is evidence of left ventricular enlargement there is mild /moderate anterior apical defect of moderate intensity with mixed defect concerning for infarct versus ischemia.  Overall ejection fraction around 45% with anterior apical hypokinesis.  This is a moderate to high risk and recommend consider further evaluation invasively possibly cardiac cath  TELEMETRY reviewed by me [facility]) 03/12/2023 : Junctional bradycardia rate 48 with periods of wide-complex idioventricular rhythm with rates in 70-80s.  EKG reviewed by me: junctional brady with nonspecific IVCD and nonspecific T wave changes  Data reviewed by me [facility]) 03/12/2023: Nursing notes, PCCM notes, vascular surgery notes , last 24h vitals tele labs imaging I/O   Principal Problem:   Malignant hypertension    ASSESSMENT AND PLAN:  Karina Leonard is an 57yoF with a PMH of HTN, bilateral carotid stenosis (70% RICA, 80% LICA), hx CVA/TIA, HFpEF (55%), abnormal lexiscan myoview (01/08/23), sinus arrhythmia who presented to [facility] for elective left carotid endarterectomy with vascular surgery. The patient's BP was extremely elevated and procedure was cancelled and was admitted for BP control.  Cardiology is consulted for further assistance.  # Malignant hypertension # Left carotid artery stenosis # Junctional bradycardia  Suspect the patient's presyncopal symptoms and minimal responsiveness  afternoon of 5/16 were iatrogenic from aggressive measures to control her BP with rapid blood pressure fluctuations from 280/70s down to as low as 90s/60s.  Symptoms improved after her BP rose back to 150 systolic.  She has explicitly denied chest pain, SOB, nausea, dizziness and is currently asymptomatic despite her bradycardia.  -Agree with current therapy per PCCM and vascular surgery -No indication for temporary transvenous pacemaker or permanent pacemaker at this time -Continue Clonidine 0.2 mg tid and Amlodipine 5 mg bid. Monitor for worsening bradycardia. Holding nephrotoxic medications given renal function.  -Vascular surgery's goal of 110-140s systolic prior to proceeding with endarterectomy. -Secondary hypertension workup pending, renal duplex negative -Monitor and replenish electrolytes for goal K >4, mag >2 -Defer additional cardiac diagnostics at this time   # ?Demand Ischemia vs ACS # Hx Abnormal Lexiscan Myoview Patient has a history of abnormal Lexiscan Myoview in 2021 (anterolateral defect with border zone redistribution, mild LV dilatation during stress and moderately reduced EF of 45-50%).  Most recently in April 2024 with moderate anterior apical hypokinesis with a mixed fixed versus reversible defect, it seems that since the patient was asymptomatic, chest pain-free further invasive cardiac diagnostics were deferred. -Patient remains without chest pain, shortness of breath, dizziness, nausea, diaphoresis.  -Troponins this admission 353 on 5/17 repeating on 5/20 and trended 2765 > 3149 > 2667. Repeat EKG today reviewed by Dr. Juliann Pares. -Suspect troponin elevated 2/2 significantly labile BP. EKGs do show nonspecific IVCD and nonspecific T wave changes which, in the absence of chest pain and in the setting of very labile BPs is non-diagnostic.   -Given trend of troponins will treat conservatively with heparin x 24-48 hours. -As above, defer additional cardiac diagnostics at this  time   #AKI on CKD Stage IIIa Cr today 3.37 from 2.64 yesterday.  -  Appreciate nephrology input, continue holding nephrotoxic medications.    # Sinus arrhythmia The patient carries a diagnosis of paroxysmal atrial fibrillation in her chart likely from an EKG obtained on 5/7 which the computer read as atrial fibrillation. EKG reviewed by Dr. Juliann Pares and Jodie Echevaria, PA who agree that the computer read is erroneous, and the rhythm is sinus bradycardia with premature atrial contractions.  She does not have an indication for chronic anticoagulation or a diagnosis of atrial fibrillation based on this EKG tracing.  This patient's plan of care was discussed and created with Dr. Juliann Pares  and he is in agreement.  Signed: Gale Journey , PA-C 03/12/2023, 2:41 PM [facility] Cardiology

## 2023-03-12 NOTE — Code Documentation (Addendum)
Stroke Response Nurse Documentation Code Documentation  Karina Leonard is a 82 y.o. female admitted to [facility] on 03/08/2023 for elective left carotid endarectomy that was cancelled due to HTN with past medical hx of  HLD, HTN, stroke, CAD, a-fib, DDD, diastolic dysfuntion, and mild cognitive impairment. On heparin IV. Code stroke was activated by ICU .   Patient on ICU unit where she was [facility] Thursday 5/16 and now complaining of word finding difficulty and facial droop. Bedside nurse initially reported LKW 1600 when they noticed facial droop at 1600 assessment, at which time a code stroke was activated. Upon further discussion with family, LKW found to be several days ago.   Stroke team meets patient in CT after patient activation.  NIHSS 3, see documentation for details and code stroke times. Patient with disoriented, left facial droop, and Expressive aphasia  on exam. The following imaging was completed:  CT Head. Patient is not a candidate for IV Thrombolytic due to outside window, per MD. Patient is not a candidate for IR due to patient outside window, per MD.   Care/Plan: q2h NIHSS and vital signs. Swallow screen per protocol.   Bedside handoff with ICU RN Harrold Donath.    Wille Glaser  Stroke Response RN

## 2023-03-12 NOTE — Progress Notes (Signed)
Progress Note   Arrived at bedside pt daughter at bedside concerned pt has new right-sided facial droop.  Performed neurological exam pt noted to have slight right sided facial droop, RUE hand grip weakness 3/5, and aphagia.  Code Stroke initiated per protocol.    Zada Girt, AGNP  Pulmonary/Critical Care Pager [phone] (please enter 7 digits) PCCM Consult Pager [phone] (please enter 7 digits)

## 2023-03-12 NOTE — Progress Notes (Addendum)
[facility]  ROUNDING NOTE   Subjective:   Patient seen and evaluated sitting in chair Alert and oriented Denies nausea or vomiting No lower extremity edema  Creatinine 3.37  Objective:  Vital signs in last 24 hours:  Temp:  [97.4 F (36.3 C)-98.6 F (37 C)] 97.4 F (36.3 C) (05/20 1127) Pulse Rate:  [48-71] 48 (05/20 1100) Resp:  [13-25] 15 (05/20 1100) BP: (111-211)/(29-103) 120/40 (05/20 1100) SpO2:  [90 %-100 %] 100 % (05/20 1100)  Weight change:  Filed Weights   03/08/23 0626 03/08/23 0841  Weight: 87.5 kg 88.1 kg    Intake/Output: I/O last 3 completed shifts: In: 1764 [P.O.:1200; I.V.:564] Out: 875 [Urine:875]   Intake/Output this shift:  Total I/O In: 0  Out: 290 [Urine:290]  Physical Exam: General: NAD  Head: Normocephalic, atraumatic. Moist oral mucosal membranes  Eyes: Anicteric  Lungs:  Clear to auscultation  Heart: Regular rate and rhythm  Abdomen:  Soft, nontender  Extremities:  No peripheral edema.  Neurologic: Alert and oriented, moving all four extremities  Skin: No lesions  Access: None    Basic Metabolic Panel: Recent Labs  Lab 03/08/23 1436 03/08/23 1437 03/08/23 1437 03/09/23 0406 03/10/23 0453 03/11/23 0417 03/12/23 0507  NA  --  137  --  138 136 135 134*  K  --  4.4  --  4.5 3.8 3.7 3.7  CL  --  108  --  107 106 104 104  CO2  --  16*  --  23 21* 23 21*  GLUCOSE  --  306*  --  125* 121* 128* 124*  BUN  --  28*  --  38* 50* 61* 65*  CREATININE  --  1.74*  --  2.38* 2.38* 2.64* 3.37*  CALCIUM  --  7.9*   < > 8.2* 8.5* 8.2* 7.8*  MG  --   --   --  1.6*  --  2.2 2.1  PHOS 4.9*  --   --  3.2  --   --  4.6   < > = values in this interval not displayed.    Liver Function Tests: Recent Labs  Lab 03/08/23 1437 03/09/23 0406 03/12/23 0507  AST 48* 82*  --   ALT 15 37  --   ALKPHOS 77 84  --   BILITOT 1.0 0.9  --   PROT 5.9* 6.5  --   ALBUMIN 3.1* 3.4* 2.7*   No results for input(s): "LIPASE", "AMYLASE" in  the last 168 hours. No results for input(s): "AMMONIA" in the last 168 hours.  CBC: Recent Labs  Lab 03/08/23 1043 03/08/23 1437 03/09/23 0406 03/11/23 0417 03/12/23 0855  WBC 13.2* 19.6* 20.6* 17.7* 12.8*  NEUTROABS  --  17.4*  --   --   --   HGB 11.4* 11.9* 11.9* 11.5* 11.2*  HCT 34.4* 36.0 35.5* 34.3* 34.5*  MCV 91.7 92.1 92.2 92.2 93.8  PLT 199 172 204 190 187    Cardiac Enzymes: No results for input(s): "CKTOTAL", "CKMB", "CKMBINDEX", "TROPONINI" in the last 168 hours.  BNP: Invalid input(s): "POCBNP"  CBG: Recent Labs  Lab 03/11/23 1118 03/11/23 1609 03/11/23 2109 03/12/23 0723 03/12/23 1121  GLUCAP 122* 135* 119* 109* 157*    Microbiology: Results for orders placed or performed during the hospital encounter of 02/27/23  Surgical pcr screen     Status: None   Collection Time: 02/27/23 12:00 PM   Specimen: Nasal Mucosa; Nasal Swab  Result Value Ref Range Status  MRSA, PCR NEGATIVE NEGATIVE Final   Staphylococcus aureus NEGATIVE NEGATIVE Final    Comment: (NOTE) The Xpert SA Assay (FDA approved for NASAL specimens in patients 54 years of age and older), is one component of a comprehensive surveillance program. It is not intended to diagnose infection nor to guide or monitor treatment. Performed at [facility], [address] [place], [place] [address]     Coagulation Studies: No results for input(s): "LABPROT", "INR" in the last 72 hours.  Urinalysis: Recent Labs    03/11/23 1637  COLORURINE YELLOW*  LABSPEC 1.011  PHURINE 5.0  GLUCOSEU NEGATIVE  HGBUR SMALL*  BILIRUBINUR NEGATIVE  KETONESUR NEGATIVE  PROTEINUR 30*  NITRITE NEGATIVE  LEUKOCYTESUR LARGE*      Imaging: US RENAL  Result Date: 03/11/2023 CLINICAL DATA:  6274 Acute kidney failure, unspecified (HCC) [id number] Chronic kidney disease (CKD) stage G3a/A1, moderately decreased glomerular filtration rate (GFR) between 45-59 mL/min EXAM: RENAL / URINARY TRACT  ULTRASOUND COMPLETE COMPARISON:  03/08/2023 FINDINGS: Right Kidney: Renal measurements: 9.6 x 4.3 x 5.2 cm = volume: 109 mL. Parenchyma is hypoechoic compared to adjacent liver. 1.6 x 1.5 cm cortical cyst partially exophytic mid kidney. No hydronephrosis. Left Kidney: Renal measurements: 10.6 x 5.2 x 5.2 cm = volume: 152 mL. 2.3 x 1.7 x 2 cm simple cyst mid kidney. No hydronephrosis. Bladder: Physiologically distended. Other: None. IMPRESSION: 1. No hydronephrosis. 2. Bilateral simple renal cysts. Electronically Signed   By: Corlis Leak M.D.   On: 03/11/2023 16:20     Medications:    sodium chloride Stopped (03/09/23 1239)   ciprofloxacin 400 mg (03/12/23 0946)   clevidipine Stopped (03/12/23 0602)    [START ON 03/13/2023] amLODipine  5 mg Oral Daily   aspirin EC  81 mg Oral Daily   Chlorhexidine Gluconate Cloth  6 each Topical Daily   cloNIDine  0.2 mg Oral TID   docusate sodium  100 mg Oral BID   enoxaparin (LOVENOX) injection  30 mg Subcutaneous Q24H   fluticasone  1 spray Each Nare Daily   insulin aspart  0-5 Units Subcutaneous QHS   insulin aspart  0-9 Units Subcutaneous TID WC   loratadine  10 mg Oral Daily   pantoprazole  40 mg Oral Daily   pravastatin  40 mg Oral q1800   venlafaxine XR  75 mg Oral QHS   acetaminophen **OR** acetaminophen, guaiFENesin-dextromethorphan, loperamide, magnesium hydroxide, melatonin, ondansetron, oxyCODONE, phenol  Assessment/ Plan:  [name] is a 82 y.o.  female  with hypertension, peripheral vascular disease, carotid artery stenosis, coronary artery disease, atrial fibrillation, diastolic congestive heart failure and TIA, who was admitted to [facility] on 03/08/2023 for Malignant hypertension [I10]   Acute Kidney Injury on chronic kidney disease stage IIIA: baseline creatinine of 1.17, GFR of 47 on 02/27/23. Acute kidney injury secondary to hypertensive emergency. Urinalysis with proteinuria and hematuria from 2023. No IV contrast exposure. No signs  of obstruction.  - Creatinine continues to rise, not unexpected during hypertension control.  - No indication for dialysis   Lab Results  Component Value Date   CREATININE 3.37 (H) 03/12/2023   CREATININE 2.64 (H) 03/11/2023   CREATININE 2.38 (H) 03/10/2023    Intake/Output Summary (Last 24 hours) at 03/12/2023 1129 Last data filed at 03/12/2023 1128 Gross per 24 hour  Intake 986.08 ml  Output 725 ml  Net 261.08 ml    Hypertension: with hypertensive emergency on admission. Placed on clevedipine gtt. Secondary work up shows no  renal artery stenosis and normal thyroid function panel. Echocardiogram from 01/08/23 reviewed.  - Home regimen of clonidine, furosemide, losartan, hydrochlorothiazide and metoprolol which have all been reordered.  - started on amlodipine this admission.  - will hold hydrochlorothiazide, furosemide and losartan  - Checking plasma renin activity, aldosterone, cortisol, metanephrines and catecholamines - Continue clonidine to prevent severe rebound hypertension.  - Blood pressure 128/42, symptomatic per critical care team. Agree with decreasing Clonidine.    LOS: 4   5/20/202411:29 AM

## 2023-03-13 ENCOUNTER — Inpatient Hospital Stay (HOSPITAL_COMMUNITY)
Admission: RE | Admit: 2023-03-13 | Discharge: 2023-03-13 | Disposition: A | Discharge status: Home / Self Care | Payer: Medicare Other | Source: Home / Self Care | Attending: Critical Care Medicine | Admitting: Critical Care Medicine

## 2023-03-13 ENCOUNTER — Inpatient Hospital Stay: Payer: Medicare Other

## 2023-03-13 DIAGNOSIS — I6389 Other cerebral infarction: Secondary | ICD-10-CM

## 2023-03-13 DIAGNOSIS — N179 Acute kidney failure, unspecified: Secondary | ICD-10-CM | POA: Diagnosis not present

## 2023-03-13 DIAGNOSIS — I6523 Occlusion and stenosis of bilateral carotid arteries: Secondary | ICD-10-CM | POA: Diagnosis not present

## 2023-03-13 DIAGNOSIS — I10 Essential (primary) hypertension: Secondary | ICD-10-CM | POA: Diagnosis not present

## 2023-03-13 DIAGNOSIS — I639 Cerebral infarction, unspecified: Secondary | ICD-10-CM | POA: Diagnosis not present

## 2023-03-13 LAB — CBC
HCT: 30.2 % — ABNORMAL LOW (ref 36.0–46.0)
Hemoglobin: 9.8 g/dL — ABNORMAL LOW (ref 12.0–15.0)
MCH: 30.7 pg (ref 26.0–34.0)
MCHC: 32.5 g/dL (ref 30.0–36.0)
MCV: 94.7 fL (ref 80.0–100.0)
Platelets: 173 10*3/uL (ref 150–400)
RBC: 3.19 MIL/uL — ABNORMAL LOW (ref 3.87–5.11)
RDW: 14.1 % (ref 11.5–15.5)
WBC: 12.3 10*3/uL — ABNORMAL HIGH (ref 4.0–10.5)
nRBC: 0 % (ref 0.0–0.2)

## 2023-03-13 LAB — GLUCOSE, CAPILLARY
Glucose-Capillary: 137 mg/dL — ABNORMAL HIGH (ref 70–99)
Glucose-Capillary: 152 mg/dL — ABNORMAL HIGH (ref 70–99)
Glucose-Capillary: 120 mg/dL — ABNORMAL HIGH (ref 70–99)
Glucose-Capillary: 150 mg/dL — ABNORMAL HIGH (ref 70–99)

## 2023-03-13 LAB — BASIC METABOLIC PANEL
Anion gap: 8 (ref 5–15)
BUN: 73 mg/dL — ABNORMAL HIGH (ref 8–23)
CO2: 22 mmol/L (ref 22–32)
Calcium: 7.6 mg/dL — ABNORMAL LOW (ref 8.9–10.3)
Chloride: 102 mmol/L (ref 98–111)
Creatinine, Ser: 3.46 mg/dL — ABNORMAL HIGH (ref 0.44–1.00)
GFR, Estimated: 13 mL/min — ABNORMAL LOW (ref >60)
Glucose, Bld: 123 mg/dL — ABNORMAL HIGH (ref 70–99)
Potassium: 3.9 mmol/L (ref 3.5–5.1)
Sodium: 132 mmol/L — ABNORMAL LOW (ref 135–145)

## 2023-03-13 LAB — ECHOCARDIOGRAM COMPLETE
AR max vel: 2.46 cm2
AV Area VTI: 2.18 cm2
AV Area mean vel: 2.16 cm2
AV Mean grad: 2 mmHg
AV Peak grad: 5.1 mmHg
Ao pk vel: 1.13 m/s
Area-P 1/2: 3.17 cm2
Est EF: >75
Height: 67 in
MV VTI: 2.63 cm2
S' Lateral: 2 cm
Weight: 3107.6 oz

## 2023-03-13 LAB — LIPID PANEL
Cholesterol: 113 mg/dL (ref 0–200)
HDL: 39 mg/dL — ABNORMAL LOW (ref >40)
LDL Cholesterol: 59 mg/dL (ref 0–99)
Total CHOL/HDL Ratio: 2.9 RATIO
Triglycerides: 74 mg/dL (ref <150)
VLDL: 15 mg/dL (ref 0–40)

## 2023-03-13 LAB — MAGNESIUM: Magnesium: 2.1 mg/dL (ref 1.7–2.4)

## 2023-03-13 LAB — URINE CULTURE: Culture: <10000 — AB

## 2023-03-13 LAB — PHOSPHORUS: Phosphorus: 5.6 mg/dL — ABNORMAL HIGH (ref 2.5–4.6)

## 2023-03-13 LAB — TROPONIN I (HIGH SENSITIVITY): Troponin I (High Sensitivity): 1734 ng/L (ref <18)

## 2023-03-13 MED ORDER — OXYCODONE HCL 5 MG PO TABS
5.0000 mg | ORAL_TABLET | Freq: Once | ORAL | Status: DC
  Filled 2023-03-13: qty 1

## 2023-03-13 MED ORDER — POLYETHYLENE GLYCOL 3350 17 G PO PACK: 17.0000 g | PACK | Freq: Every day | ORAL | Status: DC | PRN

## 2023-03-13 NOTE — Progress Notes (Signed)
*  PRELIMINARY RESULTS* Echocardiogram 2D Echocardiogram has been performed.  Karina Leonard 03/13/2023, 11:36 AM

## 2023-03-13 NOTE — TOC Initial Note (Signed)
Transition of Care [facility]) - Initial/Assessment Note    Patient Details  Name: Karina Leonard MRN: [id number] Date of Birth: 1940-11-09  Transition of Care [facility]) CM/SW Contact:    Darolyn Rua, LCSW Phone Number: 03/13/2023, 1:53 PM  Clinical Narrative:                  CSW met with patient and family at bedside, reviewed home health recommendations made by PT. They are in agreement with [facility] PT and RN services, discussed agency options. They report they would like to review agency options with family and let CSW know what agency this wish to move forward with securing home health services with.   PCP is Elizabeth Sauer, agreeable to 3in1 and RW to be ordered prior to discharge and delivered to hospital room.   Expected Discharge Plan: Home w Home Health Services Barriers to Discharge: Continued Medical Work up   Patient Goals and CMS Choice Patient states their goals for this hospitalization and ongoing recovery are:: to go home CMS Medicare.gov Compare Post Acute Care list provided to:: Patient Choice offered to / list presented to : Patient      Expected Discharge Plan and Services       Living arrangements for the past 2 months: Single Family Home                                      Prior Living Arrangements/Services Living arrangements for the past 2 months: Single Family Home Lives with:: Self                   Activities of Daily Living Home Assistive Devices/Equipment: Eyeglasses, Dentures (specify type) ADL Screening (condition at time of admission) Patient's cognitive ability adequate to safely complete daily activities?: Yes Is the patient deaf or have difficulty hearing?: No Does the patient have difficulty seeing, even when wearing glasses/contacts?: No Does the patient have difficulty concentrating, remembering, or making decisions?: No Patient able to express need for assistance with ADLs?: Yes Does the patient have difficulty dressing or  bathing?: No Independently performs ADLs?: Yes (appropriate for developmental age) Does the patient have difficulty walking or climbing stairs?: No Weakness of Legs: None Weakness of Arms/Hands: None  Permission Sought/Granted                  Emotional Assessment              Admission diagnosis:  Malignant hypertension [I10] Patient Active Problem List   Diagnosis Date Noted   Malignant hypertension 03/08/2023   Carotid stenosis 02/09/2023   Diabetes mellitus without complication (HCC) 02/09/2023   Urinary incontinence 07/19/2021   Need for vaccination 03/18/2019   H/O hypercholesterolemia 03/18/2019   Drug-induced obesity 03/18/2019   Coronary artery disease 03/18/2019   Cataract cortical, senile 03/18/2019   Taking medication for chronic disease 03/18/2019   Numbness and tingling of right upper extremity 08/15/2016   Lipoma of right upper extremity 08/15/2016   Cerebral microvascular disease 07/23/2015   Familial multiple lipoprotein-type hyperlipidemia 02/16/2015   Anxiety attack 02/16/2015   Recurrent major depressive episodes (HCC) 02/16/2015   Essential (primary) hypertension 02/16/2015   H/O: HTN (hypertension) 02/16/2015   Asymptomatic hypertensive urgency 11/14/2014   Tubulovillous adenoma of rectum 10/21/2014   Depression 10/21/2014   Nuclear sclerosis of both eyes 07/07/2014   PCP:  Duanne Limerick, MD Pharmacy:  [facility]) [facility] - [place], [place] - [address] [place] [address] Phone: [phone] Fax: [phone]  [id number], [place] - [address] AT [facility] OF [address] [facility] [place] [address] Phone: [phone] Fax: [phone]     Social Determinants of Health (SDOH) Social History: SDOH Screenings   Food Insecurity: No Food Insecurity (03/08/2023)  Housing: Low Risk  (03/08/2023)  Transportation Needs: No  Transportation Needs (03/08/2023)  Utilities: Not At Risk (03/08/2023)  Alcohol Screen: Low Risk  (03/22/2022)  Depression (PHQ2-9): Low Risk  (02/02/2023)  Financial Resource Strain: Low Risk  (03/22/2022)  Physical Activity: Insufficiently Active (03/22/2022)  Social Connections: Moderately Isolated (03/22/2022)  Stress: No Stress Concern Present (03/22/2022)  Tobacco Use: Medium Risk (03/08/2023)   SDOH Interventions:     Readmission Risk Interventions     No data to display

## 2023-03-13 NOTE — Evaluation (Signed)
Clinical/Bedside Swallow Evaluation Patient Details  Name: Karina Leonard MRN: [id number] Date of Birth: Apr 30, 1941  Today's Date: 03/13/2023 Time: SLP Start Time (ACUTE ONLY): 1005 SLP Stop Time (ACUTE ONLY): 1050 SLP Time Calculation (min) (ACUTE ONLY): 45 min  Past Medical History:  Past Medical History:  Diagnosis Date   Allergy    Aortic atherosclerosis (HCC)    Atrial fibrillation with slow ventricular response (HCC) 02/27/2023   a.) noted on preoperative ECG 02/27/2023; A.fib at 52 bpm with LVH and (+) inferolateral TWIs; b.) CHA2DS2VASc = 7 (age x 2, sex, HTN, CVA/TIA x2, vascular disease history); c.) rate/rhythm maintained on oral metoprolol; chronically antithrombotic therapy (ASA + clopidogrel)   Bilateral carotid artery disease (HCC)    a.) doppler 01/25/2023: 60% RICA, 70% LICA   Bradycardia    Cataract cortical, senile    Cerebrovascular disease    Coronary artery disease involving native coronary artery of native heart without angina pectoris    a.) MPI 05/24/2020: EF 45-50%. Borderline ant/lat defect with border zone redistribution; b.) MPI 01/08/2023: EF 45%; mild-mod mixed ant apical defect c/w infarct vs. ischemia   DDD (degenerative disc disease), lumbar    Depression    Diastolic dysfunction    a.) TTE 01/08/2023: EF >55%, mod LVH, sev LAE, mild MR/TR/PR, G1DD   Full dentures    Hyperlipidemia    Hypertension    Keloid    Lipoma    Long term current use of antithrombotics/antiplatelets    a.) on DAPT (ASA + clopidogrel)   Mild cognitive impairment    Pre-diabetes    Sciatica    Stroke [facility])    a.) MRI brain 03/19/2020: multiple old small vessel infarcts of the basal ganglia, thalamus and pons   TIA (transient ischemic attack)    Urinary incontinence    Past Surgical History:  Past Surgical History:  Procedure Laterality Date   CHOLECYSTECTOMY  2009   COLONOSCOPY     RECTAL POLYPECTOMY     benign   HPI:  Pt is an 82 yo female with PMH that  includes Cognitive decline per outpt Neurology evaluation last month(per Dtr), carotid artery stenosis, A-fib with slow ventricular response, bradycardia, CAD, DDD, depression, diastolic dysfunction, lipoma, HTN, pre-diabetes, TIA, and sciatica who presented for an elective left carotid endarterectomy per vascular surgery on 05/16.  However, procedure canceled due to severe hypertension sbp 200's.  Pt subsequently admitted to the stepdown.  Per MRI: multiple bilateral small acute infarcts with largest lesions in the R cerebellum, L temporal lobe, and L basal ganglia, malignant hypertension, left carotid artery stenosis, junctional bradycardia, demand Ischemia vs ACS, and AKI on CKD IIIa. Last evening, a code stroke was activated due to mild speech difficulty, facial droop, and right-sided weakness. Sign and symptoms resolved once back from CT, per MD note.  CXR at admit: No acute airspace disease.  3. Extensive atherosclerosis.    Assessment / Plan / Recommendation  Clinical Impression   Pt seen for BSE today. Pt awake, sitting up in chair in room w/ Dtrs present post PT session. Pt was feeding self her breakfast meal post tray setup. Pt verbal and followed all instructions w/ gentle command. She was A/O x3. She assisted w/ Denture care post meal. OF NOTE: Dtrs stated pt was evaluated by Outpt Neurologist last month and dx'd w/ Cognitive decline w/ Outpatient Cognitive therapy rec'd. Dtrs yet to schedule. Information given for Outpt ST services at [facility].  Pt afebrile, on RA; WBC 12.3.  Pt appears to present w/ functional oropharyngeal phase swallowing w/ No oropharyngeal phase dysphagia noted, No overt sensorimotor deficits noted. Pt consumed po trials w/ No immediate, overt clinical s/s of aspiration during po trials.  Pt appears at reduced risk for aspiration following general aspiration precautions. However, pt does have challenging factors that could impact oropharyngeal swallowing to include  deconditioning/weakness, hospitalization, and mild Cognitive decline. These factors can increase risk for aspiration, dysphagia as well as decreased oral intake overall.  During po trials, pt fed self/consumed all consistencies w/ no overt coughing, decline in vocal quality, or change in respiratory presentation during/post trials. O2 sats 98%. Oral phase appeared [facility] w/ timely bolus management, mastication, and control of bolus propulsion for A-P transfer for swallowing. Oral clearing achieved w/ all trial consistencies -- moistened, soft foods consumed. Pt fed self choosing foods/drink she wanted w/ no confusion nor difficulty w/ tasks noted; verbal communication re: items appropriate. She then assisted w/ Denture care afterward. OM Exam appeared [facility] w/ no unilateral weakness; no Right sided oral weakness noted. Speech intelligible, clear.   Recommend continue a Regular consistency diet w/ well-Cut meats, moistened foods; Thin liquids -- pt prefers to use straws; pt should Hold Cup when drinking. Recommend general aspiration precautions, tray setup support. Pills WHOLE in Puree for safer, easier swallowing if needed.  Education given on Pills in Puree if needed; food consistencies and easy to eat options; general aspiration precautions; denture care; f/u w/ outpt services to pt and Dtrs. NSG to reconsult if any new needs arise. NSG updated, agreed. MD updated. Recommend Dietician f/u for support if needed. SLP Visit Diagnosis: Dysphagia, unspecified (R13.10)    Aspiration Risk   (reduced following general aspiration precautions)    Diet Recommendation   continue a Regular consistency diet w/ well-Cut meats, moistened foods; Thin liquids -- pt prefers to use straws; pt should Hold Cup when drinking. Recommend general aspiration precautions, tray setup support.  Medication Administration: Whole meds with puree (if needed for ease of swallowing)    Other  Recommendations Recommended Consults:   (Dietician f/u if needed; outpt Neurology for management) Oral Care Recommendations: Oral care BID;Oral care before and after PO;Staff/trained caregiver to provide oral care (Denture care)    Recommendations for follow up therapy are one component of a multi-disciplinary discharge planning process, led by the attending physician.  Recommendations may be updated based on patient status, additional functional criteria and insurance authorization.  Follow up Recommendations No SLP follow up      Assistance Recommended at Discharge  Intermittent as needed  Functional Status Assessment Patient has not had a recent decline in their functional status  Frequency and Duration  (n/a)   (n/a)       Prognosis Prognosis for improved oropharyngeal function: Good Barriers to Reach Goals: Cognitive deficits;Time post onset;Severity of deficits Barriers/Prognosis Comment: mild Cognitive decline at baseline      Swallow Study   General Date of Onset: 03/07/23 HPI: Pt is an 82 yo female with PMH that includes Cognitive decline per outpt Neurology evaluation last month(per Dtr), carotid artery stenosis, A-fib with slow ventricular response, bradycardia, CAD, DDD, depression, diastolic dysfunction, lipoma, HTN, pre-diabetes, TIA, and sciatica who presented for an elective left carotid endarterectomy per vascular surgery on 05/16.  However, procedure canceled due to severe hypertension sbp 200's.  Pt subsequently admitted to the stepdown.  Per MRI: multiple bilateral small acute infarcts with largest lesions in the R cerebellum, L temporal lobe, and L basal ganglia,  malignant hypertension, left carotid artery stenosis, junctional bradycardia, demand Ischemia vs ACS, and AKI on CKD IIIa. Last evening, a code stroke was activated due to mild speech difficulty, facial droop, and right-sided weakness. Sign and symptoms resolved once back from CT, per MD note.  CXR at admit: No acute airspace disease.  3. Extensive  atherosclerosis. Type of Study: Bedside Swallow Evaluation Previous Swallow Assessment: none Diet Prior to this Study: Regular;Thin liquids (Level 0) Temperature Spikes Noted: No (wbc 12.3) Respiratory Status: Room air History of Recent Intubation: No Behavior/Cognition: Alert;Cooperative;Pleasant mood;Confused;Distractible;Requires cueing (mild cogntiive decline at baseline) Oral Cavity Assessment: Within Functional Limits Oral Care Completed by SLP: Recent completion by staff Oral Cavity - Dentition: Dentures, top;Dentures, bottom Vision: Functional for self-feeding Self-Feeding Abilities: Able to feed self;Needs set up Patient Positioning: Upright in chair Baseline Vocal Quality: Normal Volitional Cough: Strong Volitional Swallow: Able to elicit    Oral/Motor/Sensory Function Overall Oral Motor/Sensory Function: Within functional limits (no unilateral weakness)   Ice Chips Ice chips: Not tested   Thin Liquid Thin Liquid: Within functional limits Presentation: Self Fed;Straw (4 ozs)    Nectar Thick Nectar Thick Liquid: Not tested   Honey Thick Honey Thick Liquid: Not tested   Puree Puree: Within functional limits Presentation: Self Fed;Spoon (4 ozs)   Solid     Solid: Within functional limits Presentation: Self Fed;Spoon (8+ trials)         Jerilynn Som, MS, CCC-SLP Speech Language Pathologist Rehab Services; [facility] [phone] (ascom) Vishwa Dais 03/13/2023,1:53 PM

## 2023-03-13 NOTE — Progress Notes (Signed)
[facility]  Daily Progress Note   Subjective  -   Doing much better today.  Much more alert and awake.  Did have a CT scan and subsequently an MRI last night which showed bilateral infarcts.  Since this was on the CT scan, this was probably several days old.  This may explain her hypertensive crisis from Thursday or this may be a result of the hypertensive crisis. Creatinine today is stable.  Urine output was reasonable.  Blood pressure has been stable in the 160 systolic this seems to be a good level for her.  Objective Vitals:   03/13/23 0600 03/13/23 0700 03/13/23 0800 03/13/23 0900  BP: (!) 167/47 (!) 165/44 (!) 168/46 (!) 178/53  Pulse: (!) 47 (!) 44 (!) 43 (!) 45  Resp: 14 14 15 14   Temp:   97.8 F (36.6 C)   TempSrc:   Oral   SpO2: 98% 96% 98% 96%  Weight:      Height:        Intake/Output Summary (Last 24 hours) at 03/13/2023 0943 Last data filed at 03/12/2023 1800 Gross per 24 hour  Intake 391.86 ml  Output 515 ml  Net -123.14 ml    PULM  CTAB CV  bradycardic VASC  awake and alert with neuroexam normal today.  Laboratory CBC    Component Value Date/Time   WBC 12.3 (H) 03/13/2023 0409   HGB 9.8 (L) 03/13/2023 0409   HGB 12.5 01/31/2022 1054   HCT 30.2 (L) 03/13/2023 0409   HCT 37.8 01/31/2022 1054   PLT 173 03/13/2023 0409   PLT 214 01/31/2022 1054    BMET    Component Value Date/Time   NA 132 (L) 03/13/2023 0409   NA 143 02/02/2023 1114   K 3.9 03/13/2023 0409   CL 102 03/13/2023 0409   CO2 22 03/13/2023 0409   GLUCOSE 123 (H) 03/13/2023 0409   BUN 73 (H) 03/13/2023 0409   BUN 16 02/02/2023 1114   CREATININE 3.46 (H) 03/13/2023 0409   CALCIUM 7.6 (L) 03/13/2023 0409   GFRNONAA 13 (L) 03/13/2023 0409   GFRAA 60 01/22/2020 1140    Assessment/Planning:   Bilateral carotid artery stenosis.  Admitted when she came in for planned carotid endarterectomy with hypertensive crisis.  Blood pressure control has been difficult but  it seems to be doing much better.  Did have MRI findings yesterday of strokes.  With her known severe carotid disease left worse than right, this as well as her severe hypertensive issues or risk factors for stroke. Would ultimately benefit from left carotid endarterectomy and potentially eventually a right carotid endarterectomy as well with right-sided strokes as well.  Would delay this 2 weeks to recover from her recent stroke anyway, would really like to get her blood pressure in a reasonable range and see her renal function returned to near normal before we consider surgery.  I suspect this would be a weeks down the line. Renal function has stabilized but creatinine remains markedly elevated.  Appreciate nephrology input Blood pressure control is much improved and currently off drips.  Appreciate critical care service taking over her primary care as no surgical intervention will be performed on this admission.   Karina Leonard  03/13/2023, 9:43 AM

## 2023-03-13 NOTE — Progress Notes (Signed)
Per family, oxycodone not to be given due to previous pt somnolence and AKI.  Tylenol okay.

## 2023-03-13 NOTE — Progress Notes (Signed)
Reported BP in the 170-180s systolic to NP OUMA

## 2023-03-13 NOTE — Evaluation (Signed)
Physical Therapy Evaluation Patient Details Name: Karina Leonard MRN: [id number] DOB: February 10, 1941 Today's Date: 03/13/2023  History of Present Illness  Pt is an 82 yo female with PMH that includes carotid artery stenosis, A-fib with slow ventricular response, bradycardia, CAD, DDD, depression, diastolic dysfunction, lipoma, HTN, pre-diabetes, TIA, and sciatica who presented for an elective left carotid endarterectomy per vascular surgery on 05/16.  However, procedure canceled due to severe hypertension sbp 200's.  Pt subsequently admitted to the stepdown. MD assessment includes: multiple bilateral small acute infarcts with largest lesions in the R cerebellum, L temporal lobe, and L basal ganglia, malignant hypertension, left carotid artery stenosis, junctional bradycardia, demand Ischemia vs ACS, and AKI on CKD IIIa.   Clinical Impression  Pt was pleasant and motivated to participate during the session and put forth good effort throughout. Pt found with resting HR in the 40s, ok to participate with PT services at that level per Dr. Belia Heman.  Pt taken through graded intensity activity with no adverse symptoms and with HR increasing to the low to mid 50s with activity, MD/nsg notified.  Pt required extra time and cuing to perform functional tasks but no physical assistance and was able to amb 6 feet at the EOB and from bed to chair with no LOB.  Pt presented with BUE and BLE strength that was [facility] but some mild deficits noted on the R side compared to the left.  Pt also presented with minimally slower movements during gross and fine motor coordination testing with her RUE compared to her left.  No deficits to sensation to light touch/proprioception noted.  Pt will benefit from continued PT services upon discharge to safely address deficits listed in patient problem list for decreased caregiver assistance and eventual return to PLOF.         Recommendations for follow up therapy are one component of a  multi-disciplinary discharge planning process, led by the attending physician.  Recommendations may be updated based on patient status, additional functional criteria and insurance authorization.  Follow Up Recommendations       Assistance Recommended at Discharge Frequent or constant Supervision/Assistance  Patient can return home with the following  A little help with walking and/or transfers;A little help with bathing/dressing/bathroom;Assistance with cooking/housework;Direct supervision/assist for medications management;Help with stairs or ramp for entrance;Assist for transportation    Equipment Recommendations Rolling walker (2 wheels);BSC/3in1  Recommendations for Other Services       Functional Status Assessment Patient has had a recent decline in their functional status and demonstrates the ability to make significant improvements in function in a reasonable and predictable amount of time.     Precautions / Restrictions Precautions Precautions: Fall Restrictions Weight Bearing Restrictions: No Other Position/Activity Restrictions: Watch HR      Mobility  Bed Mobility Overal bed mobility: Needs Assistance Bed Mobility: Supine to Sit     Supine to sit: Supervision     General bed mobility comments: Min to mod extra time, effort, and use of the rail along with cues for sequencing    Transfers Overall transfer level: Needs assistance Equipment used: Rolling walker (2 wheels) Transfers: Sit to/from Stand Sit to Stand: From elevated surface, Min guard           General transfer comment: Pt unable to stand without cues for UE positioning and increased trunk flexion but with cuing able to stand with extra time and effort but no physical assist    Ambulation/Gait Ambulation/Gait assistance: Min guard Gait Distance (Feet): 6  Feet Assistive device: Rolling walker (2 wheels) Gait Pattern/deviations: Step-to pattern, Decreased step length - right, Decreased step  length - left Gait velocity: decreased     General Gait Details: Pt able to take multiple steps at the EOB and then from bed to chair with min verbal cues for sequencing; no overt LOB or buckling noted  Stairs            Wheelchair Mobility    Modified Rankin (Stroke Patients Only)       Balance Overall balance assessment: Needs assistance   Sitting balance-Leahy Scale: Good Sitting balance - Comments: Pt initially with posterior and/or R lateral lean in sitting but improved with weight shifting activities, no physical assist needed to prevent LOB Postural control: Right lateral lean, Posterior lean Standing balance support: Bilateral upper extremity supported, During functional activity Standing balance-Leahy Scale: Fair                               Pertinent Vitals/Pain Pain Assessment Pain Assessment: No/denies pain    Home Living Family/patient expects to be discharged to:: Private residence Living Arrangements: Children Available Help at Discharge: Family;Available 24 hours/day Type of Home: House Home Access: Stairs to enter Entrance Stairs-Rails: Right;Left (too wide for both) Entrance Stairs-Number of Steps: 3   Home Layout: One level Home Equipment: Agricultural consultant (2 wheels);Cane - single point Additional Comments: Pt with some difficulty providing details during history, two of her dtrs in room to assist.  Per family pt will rotate living with her three daughters at discharge or they will rotate staying with her, to be decided at a future date, pt will have 24/7 supervision as needed.  Of the various homes pt may be staying at all are 1-story and the most steps needed to enter will be 3 steps with wide bilateral rails    Prior Function Prior Level of Function : Independent/Modified Independent             Mobility Comments: Mod Ind amb with a three prong 'hurricane' type SPC, one fall in the last 6 months secondary to slipping on wet BR  floor ADLs Comments: Ind with ADLs     Hand Dominance   Dominant Hand: Right    Extremity/Trunk Assessment   Upper Extremity Assessment Upper Extremity Assessment: RUE deficits/detail;LUE deficits/detail RUE Deficits / Details: BUE strength grossly WFL with mild deficits in fine/gross motor to BUEs but mildly more pronounced on the R; RUE movements somewhat slower than on the L RUE Sensation: WNL RUE Coordination: decreased fine motor;decreased gross motor LUE Deficits / Details: BUE strength grossly WFL with mild deficits in fine/gross motor to BUEs but mildly more pronounced on the R; RUE movements somewhat slower than on the L LUE Sensation: WNL    Lower Extremity Assessment Lower Extremity Assessment: Generalized weakness;RLE deficits/detail;LLE deficits/detail RLE Deficits / Details: BLE strength grossly WFL but mild deficits on the RLE compared to the L RLE Sensation: WNL LLE Deficits / Details: BLE strength grossly WFL but mild deficits on the RLE compared to the L LLE Sensation: WNL       Communication   Communication: Other (comment) (minor word finding difficulty)  Cognition Arousal/Alertness: Awake/alert Behavior During Therapy: WFL for tasks assessed/performed Overall Cognitive Status: Impaired/Different from baseline Area of Impairment: Memory                     Memory: Decreased short-term memory  General Comments: Pt required assistance of family to provide accurate history but pleasant and able to follow all 1-step commands well with min extra time and cuing; per daughter pt with some baseline memory/cognitive deficit at baseline but more pronounced since this admission        General Comments      Exercises Other Exercises Other Exercises: Seated L lateral and anterior weight shifting activities to address R lateral and posterior lean in sitting Other Exercises: Multiple sit to/from stand transfers to/from various surfaces with cues  for sequencing   Assessment/Plan    PT Assessment Patient needs continued PT services  PT Problem List Decreased strength;Decreased activity tolerance;Decreased balance;Decreased mobility;Decreased coordination;Decreased knowledge of use of DME       PT Treatment Interventions DME instruction;Gait training;Stair training;Functional mobility training;Therapeutic activities;Therapeutic exercise;Balance training;Patient/family education    PT Goals (Current goals can be found in the Care Plan section)  Acute Rehab PT Goals Patient Stated Goal: Improved balance PT Goal Formulation: With patient Time For Goal Achievement: 03/26/23 Potential to Achieve Goals: Good    Frequency Min 4X/week     Co-evaluation               AM-PAC PT "6 Clicks" Mobility  Outcome Measure Help needed turning from your back to your side while in a flat bed without using bedrails?: A Little Help needed moving from lying on your back to sitting on the side of a flat bed without using bedrails?: A Little Help needed moving to and from a bed to a chair (including a wheelchair)?: A Little Help needed standing up from a chair using your arms (e.g., wheelchair or bedside chair)?: A Little Help needed to walk in hospital room?: A Little Help needed climbing 3-5 steps with a railing? : A Lot 6 Click Score: 17    End of Session Equipment Utilized During Treatment: Gait belt Activity Tolerance: Patient tolerated treatment well Patient left: in chair;with call bell/phone within reach;with family/visitor present Nurse Communication: Mobility status PT Visit Diagnosis: Muscle weakness (generalized) (M62.81);Difficulty in walking, not elsewhere classified (R26.2)    Time: 9604-5409 PT Time Calculation (min) (ACUTE ONLY): 37 min   Charges:   PT Evaluation $PT Eval Moderate Complexity: 1 Mod PT Treatments $Therapeutic Activity: 8-22 mins        D. Scott Elene Downum PT, DPT 03/13/23, 11:42 AM

## 2023-03-13 NOTE — Progress Notes (Signed)
Subjective: Feels better today.   Exam: Vitals:   03/13/23 1300 03/13/23 1304  BP:  (!) 158/41  Pulse:  (!) 40  Resp:  10  Temp:    SpO2: 94% 95%   Gen: In bed, NAD Resp: non-labored breathing, no acute distress Abd: soft, nt  Neuro: MS: Feels much better awake, alert, still with some word finding difficulty CN: Pupils equal round and reactive, she has a very mild left exophoria, left facial weakness Motor: She has no drift in any extremity  Pertinent Labs: LDL 59  Impression: 82 year old female with multiple strokes predominantly in the left hemisphere following hypertensive crisis and subsequent correction in the setting of left carotid stenosis as well as vertebral occlusion.  She does have a small cerebellar stroke as well.  The left carotid stenosis could explain the left hemispheric strokes, either as an embolic event or as occurring due to relative ischemia in the setting of correction of her blood pressure.  This does not, however, explained the cerebellar stroke.  It is possible that this was in a tenuous area either due to local small vessel disease or her vertebral disease.  There has not been definite documentation of atrial fibrillation per cardiology.  Her strokes are all quite small, and could consider restarting anticoagulation if the indication for such as strong, though there is some risk it is relatively small.  She does need to be on antiplatelet therapy.  MRA with some concerning findings that may be artifactual, I will do carotid ultrasound to revisit these findings.  Recommendations: 1) continue antiplatelet therapy with asa 81mg  daily 2) if needed for a non-stroke indication, could consider low dose heparin 3) carotid US 4) continue statin therapy as currently at goal LDL 5) avoid hypotension 6) agree with cardiac monitor at discharge.    Ritta Slot, MD [facility] [phone]  If 7pm- 7am, please page neurology on call as  listed in [facility].

## 2023-03-13 NOTE — Progress Notes (Signed)
[facility]  ROUNDING NOTE   Subjective:   Patient seen sitting up in bed Daughter at bedside Mentation improved today Tolerating small meals  Code stroke activated yesterday evening due to speech difficulty, facial droop, and right-sided weakness. Sign and symptoms resolved once back from CT  Creatinine 3.46  Objective:  Vital signs in last 24 hours:  Temp:  [97.4 F (36.3 C)-98 F (36.7 C)] 97.8 F (36.6 C) (05/21 0800) Pulse Rate:  [43-50] 50 (05/21 1002) Resp:  [13-20] 16 (05/21 1002) BP: (127-178)/(40-82) 127/46 (05/21 1002) SpO2:  [88 %-99 %] 93 % (05/21 1002)  Weight change:  Filed Weights   03/08/23 0626 03/08/23 0841  Weight: 87.5 kg 88.1 kg    Intake/Output: I/O last 3 completed shifts: In: 520.4 [P.O.:120; I.V.:200.4; IV Piggyback:200] Out: 525 [Urine:525]   Intake/Output this shift:  Total I/O In: -  Out: 185 [Urine:185]  Physical Exam: General: NAD  Head: Normocephalic, atraumatic. Moist oral mucosal membranes  Eyes: Anicteric  Lungs:  Clear on auscultation   Heart: Regular rate and rhythm  Abdomen:  Soft, nontender  Extremities:  No peripheral edema.  Neurologic: Alert and oriented, moving all four extremities  Skin: No lesions  Access: None    Basic Metabolic Panel: Recent Labs  Lab 03/08/23 1436 03/08/23 1437 03/09/23 0406 03/10/23 0453 03/11/23 0417 03/12/23 0507 03/13/23 0409  NA  --    < > 138 136 135 134* 132*  K  --    < > 4.5 3.8 3.7 3.7 3.9  CL  --    < > 107 106 104 104 102  CO2  --    < > 23 21* 23 21* 22  GLUCOSE  --    < > 125* 121* 128* 124* 123*  BUN  --    < > 38* 50* 61* 65* 73*  CREATININE  --    < > 2.38* 2.38* 2.64* 3.37* 3.46*  CALCIUM  --    < > 8.2* 8.5* 8.2* 7.8* 7.6*  MG  --   --  1.6*  --  2.2 2.1 2.1  PHOS 4.9*  --  3.2  --   --  4.6 5.6*   < > = values in this interval not displayed.     Liver Function Tests: Recent Labs  Lab 03/08/23 1437 03/09/23 0406 03/12/23 0507  AST 48*  82*  --   ALT 15 37  --   ALKPHOS 77 84  --   BILITOT 1.0 0.9  --   PROT 5.9* 6.5  --   ALBUMIN 3.1* 3.4* 2.7*    No results for input(s): "LIPASE", "AMYLASE" in the last 168 hours. No results for input(s): "AMMONIA" in the last 168 hours.  CBC: Recent Labs  Lab 03/08/23 1437 03/09/23 0406 03/11/23 0417 03/12/23 0855 03/13/23 0409  WBC 19.6* 20.6* 17.7* 12.8* 12.3*  NEUTROABS 17.4*  --   --   --   --   HGB 11.9* 11.9* 11.5* 11.2* 9.8*  HCT 36.0 35.5* 34.3* 34.5* 30.2*  MCV 92.1 92.2 92.2 93.8 94.7  PLT 172 204 190 187 173     Cardiac Enzymes: No results for input(s): "CKTOTAL", "CKMB", "CKMBINDEX", "TROPONINI" in the last 168 hours.  BNP: Invalid input(s): "POCBNP"  CBG: Recent Labs  Lab 03/12/23 0723 03/12/23 1121 03/12/23 1559 03/12/23 2138 03/13/23 0730  GLUCAP 109* 157* 93 133* 137*     Microbiology: Results for orders placed or performed during the hospital encounter of 02/27/23  Surgical pcr screen     Status: None   Collection Time: 02/27/23 12:00 PM   Specimen: Nasal Mucosa; Nasal Swab  Result Value Ref Range Status   MRSA, PCR NEGATIVE NEGATIVE Final   Staphylococcus aureus NEGATIVE NEGATIVE Final    Comment: (NOTE) The Xpert SA Assay (FDA approved for NASAL specimens in patients 7 years of age and older), is one component of a comprehensive surveillance program. It is not intended to diagnose infection nor to guide or monitor treatment. Performed at [facility], [address] [place], [place] [address]     Coagulation Studies: No results for input(s): "LABPROT", "INR" in the last 72 hours.  Urinalysis: Recent Labs    03/11/23 1637  COLORURINE YELLOW*  LABSPEC 1.011  PHURINE 5.0  GLUCOSEU NEGATIVE  HGBUR SMALL*  BILIRUBINUR NEGATIVE  KETONESUR NEGATIVE  PROTEINUR 30*  NITRITE NEGATIVE  LEUKOCYTESUR LARGE*       Imaging: MR BRAIN WO CONTRAST  Result Date: 03/12/2023 CLINICAL DATA:  Right facial droop EXAM:  MRI HEAD WITHOUT CONTRAST MRA HEAD WITHOUT CONTRAST MRA NECK WITHOUT CONTRAST TECHNIQUE: Multiplanar, multiecho pulse sequences of the brain and surrounding structures were obtained without intravenous contrast. Angiographic images of the Circle of Willis were obtained using MRA technique without intravenous contrast. Angiographic images of the neck were obtained using MRA technique without intravenous contrast. Carotid stenosis measurements (when applicable) are obtained utilizing NASCET criteria, using the distal internal carotid diameter as the denominator. COMPARISON:  01/27/2023 FINDINGS: MRI HEAD FINDINGS Brain: Multiple scattered bilateral small acute infarcts. The largest lesions are located in the right cerebellum, left temporal lobe and left basal ganglia. Aside from the right cerebellar lesion, all lesions are on the left. Multiple chronic microhemorrhages in a predominantly central distribution. There is confluent hyperintense T2-weighted signal within the white matter. Generalized volume loss. Ex vacuo dilatation of the right lateral ventricle. The midline structures are normal. Vascular: Major flow voids are preserved. Skull and upper cervical spine: Normal calvarium and skull base. Visualized upper cervical spine and soft tissues are normal. Sinuses/Orbits:No paranasal sinus fluid levels or advanced mucosal thickening. No mastoid or middle ear effusion. Normal orbits. MRA HEAD FINDINGS POSTERIOR CIRCULATION: --Vertebral arteries: Loss of flow related enhancement within the left V4 segment. Normal left PICA. --Inferior cerebellar arteries: Normal. --Basilar artery: Normal. --Superior cerebellar arteries: Normal. --Posterior cerebral arteries: Normal. The right PCA is predominantly supplied by the posterior communicating artery. ANTERIOR CIRCULATION: --Intracranial internal carotid arteries: Atherosclerotic irregularity of the internal carotid arteries at the skull base, left-greater-than-right.  --Anterior cerebral arteries (ACA): Normal. --Middle cerebral arteries (MCA): Normal. MRA NECK FINDINGS Motion degraded MRA of the neck. Aortic arch: Unremarkable Right carotid system: There is signal loss of the distal common carotid artery. The right ICA is patent. Left carotid system: There is atherosclerotic irregularity throughout the left carotid system with an area of signal loss near the carotid bifurcation. Vertebral arteries: Right dominant.  No stenosis. IMPRESSION: 1. Multiple scattered bilateral small acute infarcts. The largest lesions are located in the right cerebellum, left temporal lobe and left basal ganglia. No hemorrhage or mass effect. 2. Loss of flow related enhancement within the left vertebral artery V4 segment, consistent with slow flow or occlusion. 3. Signal loss of the distal right common carotid artery and left internal carotid artery near the carotid bifurcation. This may be exaggerated by motion artifact, but there is likely a component of stenosis or occlusion. CTA of the neck may be helpful for further characterization. 4. Multiple  chronic microhemorrhages in a predominantly central distribution, likely due to chronic hypertensive microangiopathy. Electronically Signed   By: Deatra Robinson M.D.   On: 03/12/2023 21:48   MR ANGIO NECK WO CONTRAST  Result Date: 03/12/2023 CLINICAL DATA:  Right facial droop EXAM: MRI HEAD WITHOUT CONTRAST MRA HEAD WITHOUT CONTRAST MRA NECK WITHOUT CONTRAST TECHNIQUE: Multiplanar, multiecho pulse sequences of the brain and surrounding structures were obtained without intravenous contrast. Angiographic images of the Circle of Willis were obtained using MRA technique without intravenous contrast. Angiographic images of the neck were obtained using MRA technique without intravenous contrast. Carotid stenosis measurements (when applicable) are obtained utilizing NASCET criteria, using the distal internal carotid diameter as the denominator. COMPARISON:   01/27/2023 FINDINGS: MRI HEAD FINDINGS Brain: Multiple scattered bilateral small acute infarcts. The largest lesions are located in the right cerebellum, left temporal lobe and left basal ganglia. Aside from the right cerebellar lesion, all lesions are on the left. Multiple chronic microhemorrhages in a predominantly central distribution. There is confluent hyperintense T2-weighted signal within the white matter. Generalized volume loss. Ex vacuo dilatation of the right lateral ventricle. The midline structures are normal. Vascular: Major flow voids are preserved. Skull and upper cervical spine: Normal calvarium and skull base. Visualized upper cervical spine and soft tissues are normal. Sinuses/Orbits:No paranasal sinus fluid levels or advanced mucosal thickening. No mastoid or middle ear effusion. Normal orbits. MRA HEAD FINDINGS POSTERIOR CIRCULATION: --Vertebral arteries: Loss of flow related enhancement within the left V4 segment. Normal left PICA. --Inferior cerebellar arteries: Normal. --Basilar artery: Normal. --Superior cerebellar arteries: Normal. --Posterior cerebral arteries: Normal. The right PCA is predominantly supplied by the posterior communicating artery. ANTERIOR CIRCULATION: --Intracranial internal carotid arteries: Atherosclerotic irregularity of the internal carotid arteries at the skull base, left-greater-than-right. --Anterior cerebral arteries (ACA): Normal. --Middle cerebral arteries (MCA): Normal. MRA NECK FINDINGS Motion degraded MRA of the neck. Aortic arch: Unremarkable Right carotid system: There is signal loss of the distal common carotid artery. The right ICA is patent. Left carotid system: There is atherosclerotic irregularity throughout the left carotid system with an area of signal loss near the carotid bifurcation. Vertebral arteries: Right dominant.  No stenosis. IMPRESSION: 1. Multiple scattered bilateral small acute infarcts. The largest lesions are located in the right  cerebellum, left temporal lobe and left basal ganglia. No hemorrhage or mass effect. 2. Loss of flow related enhancement within the left vertebral artery V4 segment, consistent with slow flow or occlusion. 3. Signal loss of the distal right common carotid artery and left internal carotid artery near the carotid bifurcation. This may be exaggerated by motion artifact, but there is likely a component of stenosis or occlusion. CTA of the neck may be helpful for further characterization. 4. 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 Electronically Signed   By: Deatra Robinson M.D.   On: 03/12/2023 21:48   MR ANGIO HEAD WO CONTRAST  Result Date: 03/12/2023 CLINICAL DATA:  Right facial droop EXAM: MRI HEAD WITHOUT CONTRAST MRA HEAD WITHOUT CONTRAST MRA NECK WITHOUT CONTRAST TECHNIQUE: Multiplanar, multiecho pulse sequences of the brain and surrounding structures were obtained without intravenous contrast. Angiographic images of the Circle of Willis were obtained using MRA technique without intravenous contrast. Angiographic images of the neck were obtained using MRA technique without intravenous contrast. Carotid stenosis measurements (when applicable) are obtained utilizing NASCET criteria, using the distal internal carotid diameter as the denominator. COMPARISON:  01/27/2023 FINDINGS: MRI HEAD FINDINGS Brain: Multiple scattered bilateral  small acute infarcts. The largest lesions are located in the right cerebellum, left temporal lobe and left basal ganglia. Aside from the right cerebellar lesion, all lesions are on the left. Multiple chronic microhemorrhages in a predominantly central distribution. There is confluent hyperintense T2-weighted signal within the white matter. Generalized volume loss. Ex vacuo dilatation of the right lateral ventricle. The midline structures are normal. Vascular: Major flow voids are preserved. Skull and  upper cervical spine: Normal calvarium and skull base. Visualized upper cervical spine and soft tissues are normal. Sinuses/Orbits:No paranasal sinus fluid levels or advanced mucosal thickening. No mastoid or middle ear effusion. Normal orbits. MRA HEAD FINDINGS POSTERIOR CIRCULATION: --Vertebral arteries: Loss of flow related enhancement within the left V4 segment. Normal left PICA. --Inferior cerebellar arteries: Normal. --Basilar artery: Normal. --Superior cerebellar arteries: Normal. --Posterior cerebral arteries: Normal. The right PCA is predominantly supplied by the posterior communicating artery. ANTERIOR CIRCULATION: --Intracranial internal carotid arteries: Atherosclerotic irregularity of the internal carotid arteries at the skull base, left-greater-than-right. --Anterior cerebral arteries (ACA): Normal. --Middle cerebral arteries (MCA): Normal. MRA NECK FINDINGS Motion degraded MRA of the neck. Aortic arch: Unremarkable Right carotid system: There is signal loss of the distal common carotid artery. The right ICA is patent. Left carotid system: There is atherosclerotic irregularity throughout the left carotid system with an area of signal loss near the carotid bifurcation. Vertebral arteries: Right dominant.  No stenosis. IMPRESSION: 1. Multiple scattered bilateral small acute infarcts. The largest lesions are located in the right cerebellum, left temporal lobe and left basal ganglia. No hemorrhage or mass effect. 2. Loss of flow related enhancement within the left vertebral artery V4 segment, consistent with slow flow or occlusion. 3. Signal loss of the distal right common carotid artery and left internal carotid artery near the carotid bifurcation. This may be exaggerated by motion artifact, but there is likely a component of stenosis or occlusion. CTA of the neck may be helpful for further characterization. 4. Multiple chronic microhemorrhages in a predominantly central distribution, likely due to  chronic hypertensive microangiopathy. Electronically Signed   By: Deatra Robinson M.D.   On: 03/12/2023 21:48   CT HEAD CODE STROKE WO CONTRAST  Result Date: 03/12/2023 CLINICAL DATA:  Code stroke. Neuro deficit, acute, stroke suspected. EXAM: CT HEAD WITHOUT CONTRAST TECHNIQUE: Contiguous axial images were obtained from the base of the skull through the vertex without intravenous contrast. RADIATION DOSE REDUCTION: This exam was performed according to the departmental dose-optimization program which includes automated exposure control, adjustment of the mA and/or kV according to patient size and/or use of iterative reconstruction technique. COMPARISON:  MRI brain 01/27/2023. FINDINGS: Brain: No acute intracranial hemorrhage. New area of hypoattenuation in the left inferior temporal gyrus. Unchanged encephalomalacia in the right posterior frontal lobe and basal ganglia from prior right MCA territory infarct. Unchanged old infarct in the right occipital lobe and severe chronic small-vessel disease. No hydrocephalus or extra-axial collection. No mass effect or midline shift. Vascular: No hyperdense vessel or unexpected calcification. Skull: No calvarial fracture or suspicious bone lesion. Skull base is unremarkable. Sinuses/Orbits: Unremarkable. Other: None. ASPECTS ([place]berta Stroke Program Early CT Score) - Ganglionic level infarction (caudate, lentiform nuclei, internal capsule, insula, M1-M3 cortex): 7 - Supraganglionic infarction (M4-M6 cortex): 3 Total score (0-10 with 10 being normal): 10 IMPRESSION: 1. New area of hypoattenuation in the left inferior temporal gyrus, concerning for acute infarct. No acute intracranial hemorrhage. 2. ASPECT score is 10. Code stroke imaging results were communicated on 03/12/2023 at 4:44 pm to  provider Dr. Amada Jupiter via secure text paging. Electronically Signed   By: Orvan Falconer M.D.   On: 03/12/2023 16:45   US Venous Img Upper Uni Right(DVT)  Result Date:  03/12/2023 CLINICAL DATA:  Right upper extremity pain and edema. EXAM: RIGHT UPPER EXTREMITY VENOUS DOPPLER ULTRASOUND TECHNIQUE: Gray-scale sonography with graded compression, as well as color Doppler and duplex ultrasound were performed to evaluate the upper extremity deep venous system from the level of the subclavian vein and including the jugular, axillary, basilic, radial, ulnar and upper cephalic vein. Spectral Doppler was utilized to evaluate flow at rest and with distal augmentation maneuvers. COMPARISON:  None Available. FINDINGS: Contralateral Subclavian Vein: Respiratory phasicity is normal and symmetric with the symptomatic side. No evidence of thrombus. Normal compressibility. Internal Jugular Vein: No evidence of thrombus. Normal compressibility, respiratory phasicity and response to augmentation. Subclavian Vein: No evidence of thrombus. Normal compressibility, respiratory phasicity and response to augmentation. Axillary Vein: No evidence of thrombus. Normal compressibility, respiratory phasicity and response to augmentation. Cephalic Vein: Superficial thrombophlebitis of a segment of the right cephalic vein in the forearm with associated venous distension. Basilic Vein: No evidence of thrombus. Normal compressibility, respiratory phasicity and response to augmentation. Brachial Veins: No evidence of thrombus. Normal compressibility, respiratory phasicity and response to augmentation. Radial Veins: No evidence of thrombus. Normal compressibility, respiratory phasicity and response to augmentation. Ulnar Veins: No evidence of thrombus. Normal compressibility, respiratory phasicity and response to augmentation. Venous Reflux:  None visualized. Other Findings:  No abnormal fluid collections identified. IMPRESSION: 1. No evidence of DVT within the right upper extremity. 2. Superficial thrombophlebitis of a segment of the right cephalic vein in the forearm. Electronically Signed   By: Irish Lack M.D.    On: 03/12/2023 12:24   US RENAL  Result Date: 03/11/2023 CLINICAL DATA:  6274 Acute kidney failure, unspecified (HCC) [id number] Chronic kidney disease (CKD) stage G3a/A1, moderately decreased glomerular filtration rate (GFR) between 45-59 mL/min EXAM: RENAL / URINARY TRACT ULTRASOUND COMPLETE COMPARISON:  03/08/2023 FINDINGS: Right Kidney: Renal measurements: 9.6 x 4.3 x 5.2 cm = volume: 109 mL. Parenchyma is hypoechoic compared to adjacent liver. 1.6 x 1.5 cm cortical cyst partially exophytic mid kidney. No hydronephrosis. Left Kidney: Renal measurements: 10.6 x 5.2 x 5.2 cm = volume: 152 mL. 2.3 x 1.7 x 2 cm simple cyst mid kidney. No hydronephrosis. Bladder: Physiologically distended. Other: None. IMPRESSION: 1. No hydronephrosis. 2. Bilateral simple renal cysts. Electronically Signed   By: Corlis Leak M.D.   On: 03/11/2023 16:20     Medications:    sodium chloride Stopped (03/09/23 1239)   ciprofloxacin Stopped (03/12/23 1046)    amLODipine  5 mg Oral Daily   aspirin EC  81 mg Oral Daily   Chlorhexidine Gluconate Cloth  6 each Topical Daily   cloNIDine  0.1 mg Oral TID   docusate sodium  100 mg Oral BID   fluticasone  1 spray Each Nare Daily   insulin aspart  0-5 Units Subcutaneous QHS   insulin aspart  0-9 Units Subcutaneous TID WC   loratadine  10 mg Oral Daily   pantoprazole  40 mg Oral Daily   pravastatin  40 mg Oral q1800   venlafaxine XR  75 mg Oral QHS   acetaminophen **OR** acetaminophen, guaiFENesin-dextromethorphan, loperamide, melatonin, ondansetron, phenol, polyethylene glycol  Assessment/ Plan:  Ms. KEYONNA MCCALLISTER is a 82 y.o.  female  with hypertension, peripheral vascular disease, carotid artery stenosis, coronary artery disease, atrial fibrillation, diastolic  congestive heart failure and TIA, who was admitted to [facility] on 03/08/2023 for Malignant hypertension [I10]   Acute Kidney Injury on chronic kidney disease stage IIIA: baseline creatinine of 1.17, GFR of 47  on 02/27/23. Acute kidney injury secondary to hypertensive emergency. Urinalysis with proteinuria and hematuria from 2023. No IV contrast exposure. No signs of obstruction.  - Creatinine rose slightly overnight, but not at same rate as prior days.  - Will continue to monitor  - No acute need for dialysis   Lab Results  Component Value Date   CREATININE 3.46 (H) 03/13/2023   CREATININE 3.37 (H) 03/12/2023   CREATININE 2.64 (H) 03/11/2023    Intake/Output Summary (Last 24 hours) at 03/13/2023 1103 Last data filed at 03/13/2023 0900 Gross per 24 hour  Intake 191.86 ml  Output 700 ml  Net -508.14 ml     Hypertension: with hypertensive emergency on admission. Placed on clevedipine gtt. Secondary work up shows no renal artery stenosis and normal thyroid function panel. Echocardiogram from 01/08/23 reviewed.  - Home regimen of clonidine, furosemide, losartan, hydrochlorothiazide and metoprolol which have all been reordered.  - started on amlodipine this admission.  - will hold hydrochlorothiazide, furosemide and losartan  - Plasma renin activity, aldosterone, cortisol, metanephrines and catecholamines pending - Blood pressure 168/46 elevated but stable, patient asymptomatic.    LOS: 5   5/21/202411:03 AM

## 2023-03-13 NOTE — Progress Notes (Signed)
NAME:  Karina Leonard, MRN:  [id number], DOB:  09-09-1941, LOS: 5 ADMISSION DATE:  03/08/2023, CONSULTATION DATE: 03/08/23 REFERRING MD: Dr. Wyn Quaker, CHIEF COMPLAINT: Hypotension    History of Present Illness:  This is an 82 yo female with carotid artery stenosis who presented for an elective left carotid endarterectomy per vascular surgery on 05/16.  However, procedure canceled due to severe hypertension sbp 200's.  Pt subsequently admitted to the stepdown unit per vascular surgery and nitroglycerin gtt initiated for bp control.  Pt also restarted on her outpatient antihypertensives.  However, she remained hypertensive and nicardipine gtt added.  Pt later developed symptomatic hypotension sbp 90's to low 100's.  According to pts daughters pts baseline sbp 140-160's.  PCCM team consulted and levophed gtt initiated.    Pertinent  Medical History  Aortic Atherosclerosis  Atrial Fibrillation with Slow Ventricular Response  Bilateral Carotid Artery Disease  Bradycardia  CAD Degenerative Disc Disease  Depression  Diastolic Dysfunction  HLD  HTN Lipoma  Mild Cognitive Impairment  Pre-Diabetes  Sciatica TIA  Urinary Incontinence   Significant Hospital Events: Including procedures, antibiotic start and stop dates in addition to other pertinent events   05/16: Pt initially admitted to the stepdown unit with hypertensive urgency and later developed symptomatic hypotension secondary to antihypertensive medication requiring levophed gtt PCCM team consulted to assist with management  05/18: Pt had another episode of accelerated hypertension we have reviewed            medications, cleviprex gtt started overnight. Patient seems to have developed hydralazine        tolerance with rebound effect. Have increased HCTZ to 50 from 25.  Have met with        cardiology - Dr Juliann Pares , amlodipine 5 bid has been initiated. Will consider additional        therapy as needed while weaning down cleviprex gtt.  Her  Chronic renal impairment        seems to have acute component with oliguric aki, will get nephrology to evaluate.  05/19: Pt in no distress this am. BP still elevated and still requires cleviprex titration with the PO regimen. Nephrology restarted clonidine due to concern of rebound hypertension following discontinuation of outpatient clonidine patch  05/20: Cleviprex gtt currently off.  SBP improved significantly now 130-150.  Pt now with increasing troponin [phone] Cardiology following and aware 05/20: MRA Head/Neck: multiple scattered bilateral small acute infarcts. The largest        lesions are located in the right cerebellum, left temporal lobe and left basal ganglia. No        hemorrhage or mass effect. Loss of flow related enhancement within the left vertebral        artery V4 segment, consistent with slow flow or occlusion. Signal loss of the distal right        common carotid artery and left internal carotid artery near the carotid bifurcation. This        may be exaggerated by motion artifact, but there is likely a component of stenosis or        occlusion. CTA of the neck may be helpful for further characterization. Multiple chronic        microhemorrhages in a predominantly central distribution, likely due to chronic        hypertensive microangiopathy. 05/21: Code stroke initiated on 05/20 due to new onset right-sided facial droop and RUE        weakness.  CT Head revealed new  area of hypoattenuation in the left inferior temporal        gyrus, concerning for acute infarct. No acute intracranial hemorrhage. ASPECT score is 10.  Micro Data:   Urine 05/20>>  Antimicrobials:    Anti-infectives (From admission, onward)    Start     Dose/Rate Route Frequency Ordered Stop   03/12/23 0900  ciprofloxacin (CIPRO) IVPB 400 mg        400 mg 200 mL/hr over 60 Minutes Intravenous Every 24 hours 03/12/23 0812     03/08/23 0600  vancomycin (VANCOCIN) IVPB 1000 mg/200 mL premix         1,000 mg 200 mL/hr over 60 Minutes Intravenous On call to O.R. 03/08/23 0018 03/08/23 0855      Interim History / Subjective:  No significant events overnight sbp 150 to 160's  Objective   Blood pressure (!) 165/44, pulse (!) 44, temperature 98 F (36.7 C), temperature source Axillary, resp. rate 14, height 5\' 7"  (1.702 m), weight 88.1 kg, SpO2 96 %.        Intake/Output Summary (Last 24 hours) at 03/13/2023 0747 Last data filed at 03/12/2023 1800 Gross per 24 hour  Intake 391.86 ml  Output 515 ml  Net -123.14 ml   Filed Weights   03/08/23 0626 03/08/23 0841  Weight: 87.5 kg 88.1 kg    Examination: General: Acutely-ill appearing female, NAD on RA  HENT: Supple, no JVD  Lungs: Clear throughout, even, non labored  Cardiovascular: Sinus bradycardia, no m/r/g, 2+ radial/1+ distal pulses, no edema  Abdomen: +BS x4, obese, soft, non tender  Extremities: Normal bulk and tone, moves all extremities  Neuro: Alert and oriented, followings commands, BUE/BLE motor strength 5/5, PERRLA GU: Indwelling foley catheter draining cloudy yellow urine   Resolved Hospital Problem list   Hypotension   Assessment & Plan:  #Hypertension urgency~improving  #Elevated troponin's secondary to demand ischemia vs. NSTEMI  Hx: Bradycardia, HLD, CAD, atrial fibrillation with slow ventricular response  Echo 01/08/23: EF >55%; mild MR/TR; trivial PR; moderate LVH - Continuous telemetry monitoring  - TSH 3.217/Free T4 1.00 - Follow EKG's  - Troponin peaked at 3,149 - Cardiology consulted appreciate input  - Continue amlodipine and clonidine - Continue outpatient asprin and statin; heparin gtt discontinued per neurology recommendations 05/20 due to acute CVA     - Echo pending   #Bilateral carotid stenosis (50% right common carotid artery stenosis/60% right internal carotid stenosis with a 70% left common carotid artery stenosis) - Vascular surgery consulted appreciate input: due to acute on chronic  renal failure and uncontrolled htn recommend waiting a few weeks to perform carotid endarterectomy until renal function and bp stabilizes   #Acute kidney injury on chronic kidney disease stage IIIA secondary to hypertensive urgency (baseline creatinine of 1.17, GFR of 47 on 02/27/2023)~worsening  US Renal Artery Duplex Limited 03/08/23: No Doppler ultrasound evidence of hemodynamically significant renal artery stenosis. If there is continued clinical concern, renal MRA (lower radiation risk, can be performed noncontrast in the setting of renal dysfunction) and CTA ( higher spatial resolution) represent more accurate studies, which are additionally more sensitive to the detection of duplicated renal arteries. - Trend BMP  - Replace electrolytes as indicated  - Monitor UOP - Avoid nephrotoxic medications when able  - Nephrology consulted appreciate input   #Possible UTI  - Trend WBC and monitor fever curve  - Trend PCT  - Follow urine culture  - Continue abx as outlined above pending culture results and sensitivities   #  Chronic diarrhea  #GERD  - Continue PPI - Prn imodium   #Prediabetes  Hemoglobin A1c 03/08/23: 5.7 - CBG's ac/hs  - SSI   #New multiple scattered bilateral small acute infarcts - Neurology consulted appreciate input  - PT/OT/Speech therapy consulted appreciate input   Best Practice (right click and "Reselect all SmartList Selections" daily)   Diet/type: Regular consistency (see orders) DVT prophylaxis: LMWH GI prophylaxis: PPI Lines: N/A Foley:  N/A Code Status:  full code Last date of multidisciplinary goals of care discussion [03/13/23]  05/21: Updated pt and pts daughters regarding current plan of care and answered all questions.   Labs   CBC: Recent Labs  Lab 03/08/23 1437 03/09/23 0406 03/11/23 0417 03/12/23 0855 03/13/23 0409  WBC 19.6* 20.6* 17.7* 12.8* 12.3*  NEUTROABS 17.4*  --   --   --   --   HGB 11.9* 11.9* 11.5* 11.2* 9.8*  HCT 36.0 35.5*  34.3* 34.5* 30.2*  MCV 92.1 92.2 92.2 93.8 94.7  PLT 172 204 190 187 173    Basic Metabolic Panel: Recent Labs  Lab 03/08/23 1436 03/08/23 1437 03/09/23 0406 03/10/23 0453 03/11/23 0417 03/12/23 0507 03/13/23 0409  NA  --    < > 138 136 135 134* 132*  K  --    < > 4.5 3.8 3.7 3.7 3.9  CL  --    < > 107 106 104 104 102  CO2  --    < > 23 21* 23 21* 22  GLUCOSE  --    < > 125* 121* 128* 124* 123*  BUN  --    < > 38* 50* 61* 65* 73*  CREATININE  --    < > 2.38* 2.38* 2.64* 3.37* 3.46*  CALCIUM  --    < > 8.2* 8.5* 8.2* 7.8* 7.6*  MG  --   --  1.6*  --  2.2 2.1 2.1  PHOS 4.9*  --  3.2  --   --  4.6 5.6*   < > = values in this interval not displayed.   GFR: Estimated Creatinine Clearance: 14.8 mL/min (A) (by C-G formula based on SCr of 3.46 mg/dL (H)). Recent Labs  Lab 03/09/23 0406 03/11/23 0417 03/12/23 0855 03/12/23 0950 03/13/23 0409  PROCALCITON  --   --   --  1.00  --   WBC 20.6* 17.7* 12.8*  --  12.3*    Liver Function Tests: Recent Labs  Lab 03/08/23 1437 03/09/23 0406 03/12/23 0507  AST 48* 82*  --   ALT 15 37  --   ALKPHOS 77 84  --   BILITOT 1.0 0.9  --   PROT 5.9* 6.5  --   ALBUMIN 3.1* 3.4* 2.7*   No results for input(s): "LIPASE", "AMYLASE" in the last 168 hours. No results for input(s): "AMMONIA" in the last 168 hours.  ABG No results found for: "PHART", "PCO2ART", "PO2ART", "HCO3", "TCO2", "ACIDBASEDEF", "O2SAT"   Coagulation Profile: No results for input(s): "INR", "PROTIME" in the last 168 hours.  Cardiac Enzymes: No results for input(s): "CKTOTAL", "CKMB", "CKMBINDEX", "TROPONINI" in the last 168 hours.  HbA1C: Hgb A1c MFr Bld  Date/Time Value Ref Range Status  03/08/2023 05:56 PM 5.7 (H) 4.8 - 5.6 % Final    Comment:    (NOTE) Pre diabetes:          5.7%-6.4%  Diabetes:              >6.4%  Glycemic control for   <7.0%  adults with diabetes   02/02/2023 11:14 AM 6.0 (H) 4.8 - 5.6 % Final    Comment:             Prediabetes:  5.7 - 6.4          Diabetes: >6.4          Glycemic control for adults with diabetes: <7.0     CBG: Recent Labs  Lab 03/12/23 0723 03/12/23 1121 03/12/23 1559 03/12/23 2138 03/13/23 0730  GLUCAP 109* 157* 93 133* 137*    Review of Systems: Positives in BOLD   Gen: fever, chills, weight change, fatigue, night sweats HEENT: Denies blurred vision, double vision, hearing loss, tinnitus, sinus congestion, rhinorrhea, sore throat, neck stiffness, dysphagia PULM: shortness of breath, cough, sputum production, hemoptysis, wheezing CV: Denies chest pain, edema, orthopnea, paroxysmal nocturnal dyspnea, palpitations GI: Denies abdominal pain, nausea, vomiting, diarrhea, hematochezia, melena, constipation, change in bowel habits GU: Denies dysuria, hematuria, polyuria, oliguria, urethral discharge Endocrine: Denies hot or cold intolerance, polyuria, polyphagia or appetite change Derm: Denies rash, dry skin, scaling or peeling skin change Heme: Denies easy bruising, bleeding, bleeding gums Neuro: dizziness, headache, numbness, weakness, slurred speech, loss of memory or consciousness   Past Medical History:  She,  has a past medical history of Allergy, Aortic atherosclerosis (HCC), Atrial fibrillation with slow ventricular response (HCC) (02/27/2023), Bilateral carotid artery disease (HCC), Bradycardia, Cataract cortical, senile, Cerebrovascular disease, Coronary artery disease involving native coronary artery of native heart without angina pectoris, DDD (degenerative disc disease), lumbar, Depression, Diastolic dysfunction, Full dentures, Hyperlipidemia, Hypertension, Keloid, Lipoma, Long term current use of antithrombotics/antiplatelets, Mild cognitive impairment, Pre-diabetes, Sciatica, Stroke (HCC), TIA (transient ischemic attack), and Urinary incontinence.   Surgical History:   Past Surgical History:  Procedure Laterality Date   CHOLECYSTECTOMY  2009   COLONOSCOPY     RECTAL POLYPECTOMY      benign     Social History:   reports that she quit smoking about 57 years ago. Her smoking use included cigarettes. She has a 20.00 pack-year smoking history. She has never used smokeless tobacco. She reports current alcohol use. She reports that she does not use drugs.   Family History:  Her family history includes Cancer in her father and mother; Heart attack in her sister.   Allergies Allergies  Allergen Reactions   Amoxicillin Hives   Donepezil Diarrhea   Nicardipine Hcl In Dextrose Other (See Comments)    Patient vasovagal response on low dose cardene   Penicillins Hives     Home Medications  Prior to Admission medications   Medication Sig Start Date End Date Taking? Authorizing Provider  aspirin EC 81 MG tablet Take 81 mg by mouth daily. Take while off of the plavix for surgey   Yes [provider]  cloNIDine (CATAPRES - DOSED IN MG/24 HR) 0.2 mg/24hr patch Place 0.2 mg onto the skin once a week. Sunday   Yes [provider]  fexofenadine (ALLEGRA) 180 MG tablet Take 1 tablet (180 mg total) by mouth daily. otc 07/22/18  Yes Duanne Limerick, MD  fluticasone (FLONASE) 50 MCG/ACT nasal spray Place 1 spray into both nostrils daily. otc   Yes [provider]  furosemide (LASIX) 20 MG tablet Take 20 mg by mouth daily. [place] 01/18/23  Yes [provider]  losartan-hydrochlorothiazide (HYZAAR) 100-25 MG tablet Take 1 tablet by mouth daily. 02/02/23  Yes Duanne Limerick, MD  lovastatin (MEVACOR) 20 MG tablet TAKE 2 TABLETS EVERY DAY Patient taking differently:  Take 2 tablets by mouth at bedtime. TAKE 2 TABLETS EVERY DAY 02/02/23  Yes Duanne Limerick, MD  metoprolol succinate (TOPROL-XL) 50 MG 24 hr tablet Take 1 tablet (50 mg total) by mouth daily. [place] 05/25/22  Yes Duanne Limerick, MD  venlafaxine XR (EFFEXOR-XR) 75 MG 24 hr capsule Take 1 capsule (75 mg total) by mouth daily. Patient taking differently: Take 75 mg by mouth at bedtime. 02/02/23   Yes Duanne Limerick, MD  clopidogrel (PLAVIX) 75 MG tablet Take 1 tablet (75 mg total) by mouth daily. 02/02/23   Duanne Limerick, MD     Critical care time: 40 minutes     Zada Girt, AGNP  Pulmonary/Critical Care Pager [phone] (please enter 7 digits) PCCM Consult Pager [phone] (please enter 7 digits) a

## 2023-03-13 NOTE — Evaluation (Signed)
Occupational Therapy Evaluation Patient Details Name: Karina Leonard MRN: [id number] DOB: Aug 24, 1941 Today's Date: 03/13/2023   History of Present Illness Pt is an 82 yo female with PMH that includes carotid artery stenosis, A-fib with slow ventricular response, bradycardia, CAD, DDD, depression, diastolic dysfunction, lipoma, HTN, pre-diabetes, TIA, and sciatica who presented for an elective left carotid endarterectomy per vascular surgery on 05/16.  However, procedure canceled due to severe hypertension sbp 200's.  Pt subsequently admitted to the stepdown. MD assessment includes: multiple bilateral small acute infarcts with largest lesions in the R cerebellum, L temporal lobe, and L basal ganglia, malignant hypertension, left carotid artery stenosis, junctional bradycardia, demand Ischemia vs ACS, and AKI on CKD IIIa.   Clinical Impression   Patient presenting with decreased Ind in self care, balance, functional mobility/transfers, endurance, and safety awareness. Patient reports living at home at [facility] level with use of [facility] for mobility. She enjoys shopping and going to her outpt physical therapy program. Patient needing min A for bed mobility and stand from EOB with RW with min A and able to ambulate 100' with RW and min guard. HR increased to 58 bpm and pt reports feeling well but does fatigue towards the end. Pt was able to donn B socks while seated EOB, with use of figure four position and min guard for balance. Pt with some R lateral lean while seated on EOB noticed more after ambulation and pt likely more fatigued at this point. Sit >supine with min guard. Patient will benefit from acute OT to increase overall independence in the areas of ADLs, functional mobility,and safety awareness  in order to safely discharge.     Recommendations for follow up therapy are one component of a multi-disciplinary discharge planning process, led by the attending physician.  Recommendations may be updated based  on patient status, additional functional criteria and insurance authorization.   Assistance Recommended at Discharge Intermittent Supervision/Assistance  Patient can return home with the following A little help with walking and/or transfers;A little help with bathing/dressing/bathroom;Assistance with cooking/housework;Assist for transportation;Help with stairs or ramp for entrance;Direct supervision/assist for financial management;Direct supervision/assist for medications management    Functional Status Assessment  Patient has had a recent decline in their functional status and demonstrates the ability to make significant improvements in function in a reasonable and predictable amount of time.  Equipment Recommendations  None recommended by OT       Precautions / Restrictions Precautions Precautions: Fall      Mobility Bed Mobility Overal bed mobility: Needs Assistance Bed Mobility: Supine to Sit, Sit to Supine     Supine to sit: Min assist Sit to supine: Min guard        Transfers Overall transfer level: Needs assistance Equipment used: Rolling walker (2 wheels) Transfers: Sit to/from Stand Sit to Stand: Min assist                  Balance Overall balance assessment: Needs assistance   Sitting balance-Leahy Scale: Good Sitting balance - Comments: initially R lateral lean but improves during session Postural control: Right lateral lean, Posterior lean Standing balance support: Bilateral upper extremity supported, During functional activity Standing balance-Leahy Scale: Fair                             ADL either performed or assessed with clinical judgement   ADL Overall ADL's : Needs assistance/impaired  Toilet Transfer: Minimal assistance;Rolling walker (2 wheels) Toilet Transfer Details (indicate cue type and reason): simulated           General ADL Comments: Pt dons B socks while seated on EOB with use of  figure four position and min guard for balance.     Vision Patient Visual Report: No change from baseline              Pertinent Vitals/Pain Pain Assessment Pain Assessment: No/denies pain     Hand Dominance Right   Extremity/Trunk Assessment Upper Extremity Assessment Upper Extremity Assessment: RUE deficits/detail RUE Deficits / Details: BUE strength grossly WFL with mild deficits in fine/gross motor to BUEs but mildly more pronounced on the R; RUE movements somewhat slower than on the L RUE Sensation: WNL RUE Coordination: decreased fine motor;decreased gross motor LUE Deficits / Details: BUE strength grossly WFL with mild deficits in fine/gross motor to BUEs but mildly more pronounced on the R; RUE movements somewhat slower than on the L              Cognition Arousal/Alertness: Awake/alert Behavior During Therapy: WFL for tasks assessed/performed Overall Cognitive Status: Impaired/Different from baseline                                 General Comments: Pt required assistance of family to provide accurate history but pleasant and able to follow all 1-step commands well with min extra time and cuing; per daughter pt with some baseline memory/cognitive deficit at baseline but more pronounced since this admission                Home Living Family/patient expects to be discharged to:: Private residence Living Arrangements: Children Available Help at Discharge: Family;Available 24 hours/day Type of Home: House Home Access: Stairs to enter [employer] of Steps: 3 Entrance Stairs-Rails: Right;Left Home Layout: One level               Home Equipment: Agricultural consultant (2 wheels);Cane - single point   Additional Comments: Pt with some difficulty providing details during history, two of her dtrs in room to assist.  Per family pt will rotate living with her three daughters at discharge or they will rotate staying with her, to be decided at a  future date, pt will have 24/7 supervision as needed.  Of the various homes pt may be staying at all are 1-story and the most steps needed to enter will be 3 steps with wide bilateral rails      Prior Functioning/Environment Prior Level of Function : Independent/Modified Independent             Mobility Comments: Mod Ind amb with a three prong 'hurricane' type SPC, one fall in the last 6 months secondary to slipping on wet BR floor ADLs Comments: Ind with ADLs        OT Problem List: Decreased strength;Decreased activity tolerance;Decreased safety awareness;Impaired balance (sitting and/or standing);Decreased knowledge of use of DME or AE;Decreased coordination      OT Treatment/Interventions: Self-care/ADL training;Therapeutic exercise;Therapeutic activities;Energy conservation;Manual therapy;Balance training;Patient/family education    OT Goals(Current goals can be found in the care plan section) Acute Rehab OT Goals Patient Stated Goal: to get stronger and return to PLOF OT Goal Formulation: With patient/family Time For Goal Achievement: 03/27/23 Potential to Achieve Goals: Fair ADL Goals Pt Will Perform Grooming: with modified independence;standing Pt Will Perform Lower Body Dressing: with supervision;sit to/from  stand Pt Will Transfer to Toilet: with supervision;ambulating Pt Will Perform Toileting - Clothing Manipulation and hygiene: with supervision;sit to/from stand  OT Frequency: Min 3X/week       AM-PAC OT "6 Clicks" Daily Activity     Outcome Measure Help from another person eating meals?: None Help from another person taking care of personal grooming?: A Little Help from another person toileting, which includes using toliet, bedpan, or urinal?: A Lot Help from another person bathing (including washing, rinsing, drying)?: A Lot Help from another person to put on and taking off regular upper body clothing?: A Little Help from another person to put on and taking off  regular lower body clothing?: A Lot 6 Click Score: 16   End of Session Equipment Utilized During Treatment: Rolling walker (2 wheels) Nurse Communication: Mobility status  Activity Tolerance: Patient tolerated treatment well Patient left: in bed;with call bell/phone within reach;with bed alarm set  OT Visit Diagnosis: Unsteadiness on feet (R26.81);Repeated falls (R29.6);Muscle weakness (generalized) (M62.81)                Time: 4696-2952 OT Time Calculation (min): 31 min Charges:  OT General Charges $OT Visit: 1 Visit OT Evaluation $OT Eval Moderate Complexity: 1 Mod OT Treatments $Self Care/Home Management : 8-22 mins $Therapeutic Activity: 8-22 mins  Jackquline Denmark, MS, OTR/L , CBIS ascom [phone]  03/13/23, 4:15 PM

## 2023-03-13 NOTE — Progress Notes (Addendum)
[facility] CLINIC CARDIOLOGY CONSULT NOTE       Patient ID: KAYLANN BEHNER MRN: [id number] DOB/AGE: August 28, 1941 82 y.o.  Admit date: 03/08/2023 Referring Physician Zada Girt, NP  Primary Physician Dr. Elizabeth Sauer Primary Cardiologist Dr. Juliann Pares Reason for Consultation labile BP, junctional bradycardia   HPI: Jaspreet "Sandy" J. Pitsenbarger is an 67yoF with a PMH of HTN, bilateral carotid stenosis (70% RICA, 80% LICA), hx CVA/TIA, HFpEF (55%), abnormal lexiscan myoview (01/08/23), sinus arrhythmia who presented to [facility] for elective left carotid endarterectomy with vascular surgery. The patient's BP was extremely elevated and procedure was cancelled and was admitted for BP control.  Cardiology is consulted for further assistance.  Interval History:  -Patient states she is feeling well this AM, no chest pain, SOB, weakness, dizziness.  -Code Stroke called yesterday at 1630 for R sided facial drooping. CT positive for L inferior temporal gyrus hypoattentuation. Neurology following, heparin has been held per their recommendation.   Review of systems complete and found to be negative unless listed above    Past Medical History:  Diagnosis Date   Allergy    Aortic atherosclerosis (HCC)    Atrial fibrillation with slow ventricular response (HCC) 02/27/2023   a.) noted on preoperative ECG 02/27/2023; A.fib at 52 bpm with LVH and (+) inferolateral TWIs; b.) CHA2DS2VASc = 7 (age x 2, sex, HTN, CVA/TIA x2, vascular disease history); c.) rate/rhythm maintained on oral metoprolol; chronically antithrombotic therapy (ASA + clopidogrel)   Bilateral carotid artery disease (HCC)    a.) doppler 01/25/2023: 60% RICA, 70% LICA   Bradycardia    Cataract cortical, senile    Cerebrovascular disease    Coronary artery disease involving native coronary artery of native heart without angina pectoris    a.) MPI 05/24/2020: EF 45-50%. Borderline ant/lat defect with border zone redistribution; b.) MPI 01/08/2023: EF  45%; mild-mod mixed ant apical defect c/w infarct vs. ischemia   DDD (degenerative disc disease), lumbar    Depression    Diastolic dysfunction    a.) TTE 01/08/2023: EF >55%, mod LVH, sev LAE, mild MR/TR/PR, G1DD   Full dentures    Hyperlipidemia    Hypertension    Keloid    Lipoma    Long term current use of antithrombotics/antiplatelets    a.) on DAPT (ASA + clopidogrel)   Mild cognitive impairment    Pre-diabetes    Sciatica    Stroke [facility])    a.) MRI brain 03/19/2020: multiple old small vessel infarcts of the basal ganglia, thalamus and pons   TIA (transient ischemic attack)    Urinary incontinence     Past Surgical History:  Procedure Laterality Date   CHOLECYSTECTOMY  2009   COLONOSCOPY     RECTAL POLYPECTOMY     benign    Medications Prior to Admission  Medication Sig Dispense Refill Last Dose   aspirin EC 81 MG tablet Take 81 mg by mouth daily. Take while off of the plavix for surgey   03/07/2023   cloNIDine (CATAPRES - DOSED IN MG/24 HR) 0.2 mg/24hr patch Place 0.2 mg onto the skin once a week. Sunday   Past Week   fexofenadine (ALLEGRA) 180 MG tablet Take 1 tablet (180 mg total) by mouth daily. otc 90 tablet 1 Past Week   fluticasone (FLONASE) 50 MCG/ACT nasal spray Place 1 spray into both nostrils daily. otc   Past Week   furosemide (LASIX) 20 MG tablet Take 20 mg by mouth daily. callwood   Past Week   losartan-hydrochlorothiazide (  HYZAAR) 100-25 MG tablet Take 1 tablet by mouth daily. 90 tablet 1 03/07/2023   lovastatin (MEVACOR) 20 MG tablet TAKE 2 TABLETS EVERY DAY (Patient taking differently: Take 2 tablets by mouth at bedtime. TAKE 2 TABLETS EVERY DAY) 180 tablet 1 03/07/2023   metoprolol succinate (TOPROL-XL) 50 MG 24 hr tablet Take 1 tablet (50 mg total) by mouth daily. [place] 90 tablet 0 03/08/2023 at 0500   venlafaxine XR (EFFEXOR-XR) 75 MG 24 hr capsule Take 1 capsule (75 mg total) by mouth daily. (Patient taking differently: Take 75 mg by mouth at bedtime.)  90 capsule 1 03/07/2023   clopidogrel (PLAVIX) 75 MG tablet Take 1 tablet (75 mg total) by mouth daily. 90 tablet 1 03/01/2023   Social History   Socioeconomic History   Marital status: Widowed    Spouse name: Not on file   Number of children: 3   Years of education: some college   Highest education level: 12th grade  Occupational History   Occupation: Retired  Tobacco Use   Smoking status: Former    Packs/day: 1.00    Years: 20.00    Additional pack years: 0.00    Total pack years: 20.00    Types: Cigarettes    Quit date: 51    Years since quitting: 57.4   Smokeless tobacco: Never  Vaping Use   Vaping Use: Never used  Substance and Sexual Activity   Alcohol use: Yes    Alcohol/week: 0.0 standard drinks of alcohol    Comment: special occasions   Drug use: No   Sexual activity: Never  Other Topics Concern   Not on file  Social History Narrative   Lives alone   Social Determinants of Health   Financial Resource Strain: Low Risk  (03/22/2022)   Overall Financial Resource Strain (CARDIA)    Difficulty of Paying Living Expenses: Not hard at all  Food Insecurity: No Food Insecurity (03/08/2023)   Hunger Vital Sign    Worried About Running Out of Food in the Last Year: Never true    Ran Out of Food in the Last Year: Never true  Transportation Needs: No Transportation Needs (03/08/2023)   PRAPARE - Administrator, Civil Service (Medical): No    Lack of Transportation (Non-Medical): No  Physical Activity: Insufficiently Active (03/22/2022)   Exercise Vital Sign    Days of Exercise per Week: 3 days    Minutes of Exercise per Session: 30 min  Stress: No Stress Concern Present (03/22/2022)   [employer] of Occupational Health - Occupational Stress Questionnaire    Feeling of Stress : Not at all  Social Connections: Moderately Isolated (03/22/2022)   Social Connection and Isolation Panel [NHANES]    Frequency of Communication with Friends and Family: More than  three times a week    Frequency of Social Gatherings with Friends and Family: Twice a week    Attends Religious Services: More than 4 times per year    Active Member of [employer] or Organizations: No    Attends Banker Meetings: Never    Marital Status: Widowed  Intimate Partner Violence: Not At Risk (03/08/2023)   Humiliation, Afraid, Rape, and Kick questionnaire    Fear of Current or Ex-Partner: No    Emotionally Abused: No    Physically Abused: No    Sexually Abused: No    Family History  Problem Relation Age of Onset   Cancer Mother        uterine or ovarian.  Unable to remember   Cancer Father        lung   Heart attack Sister       Intake/Output Summary (Last 24 hours) at 03/13/2023 1058 Last data filed at 03/13/2023 0900 Gross per 24 hour  Intake 391.86 ml  Output 700 ml  Net -308.14 ml    Vitals:   03/13/23 0700 03/13/23 0800 03/13/23 0900 03/13/23 1002  BP: (!) 165/44 (!) 168/46 (!) 178/53 (!) 127/46  Pulse: (!) 44 (!) 43 (!) 45 (!) 50  Resp: 14 15 14 16   Temp:  97.8 F (36.6 C)    TempSrc:  Oral    SpO2: 96% 98% 96% 93%  Weight:      Height:        PHYSICAL EXAM General: Pleasant elderly black female, well nourished, in no acute distress. Sitting upright in hospital bed with daughter present.   HEENT:  Normocephalic and atraumatic. Neck:  No JVD.  Lungs: Normal respiratory effort on room air. Clear bilaterally to auscultation. No wheezes, crackles, rhonchi.  Heart: Bradycardic but regular. Normal S1 and S2 without gallops or murmurs.  Abdomen: Non-distended appearing.  Msk: Normal strength and tone for age. Extremities: Warm and well perfused. No clubbing, cyanosis.  No peripheral edema.  Neuro: Alert and oriented X 3. No residual facial drooping, weakness.  Psych:  Answers questions appropriately.   Labs: Basic Metabolic Panel: Recent Labs    03/12/23 0507 03/13/23 0409  NA 134* 132*  K 3.7 3.9  CL 104 102  CO2 21* 22  GLUCOSE 124*  123*  BUN 65* 73*  CREATININE 3.37* 3.46*  CALCIUM 7.8* 7.6*  MG 2.1 2.1  PHOS 4.6 5.6*   Liver Function Tests: Recent Labs    03/12/23 0507  ALBUMIN 2.7*    No results for input(s): "LIPASE", "AMYLASE" in the last 72 hours. CBC: Recent Labs    03/12/23 0855 03/13/23 0409  WBC 12.8* 12.3*  HGB 11.2* 9.8*  HCT 34.5* 30.2*  MCV 93.8 94.7  PLT 187 173   Cardiac Enzymes: Recent Labs    03/12/23 0857 03/12/23 1102 03/13/23 0409  TROPONINIHS 3,149* 2,667* 1,734*   BNP: No results for input(s): "BNP" in the last 72 hours.  D-Dimer: No results for input(s): "DDIMER" in the last 72 hours. Hemoglobin A1C: No results for input(s): "HGBA1C" in the last 72 hours.  Fasting Lipid Panel: Recent Labs    03/13/23 0409  CHOL 113  HDL 39*  LDLCALC 59  TRIG 74  CHOLHDL 2.9   Thyroid Function Tests: No results for input(s): "TSH", "T4TOTAL", "T3FREE", "THYROIDAB" in the last 72 hours.  Invalid input(s): "FREET3"  Anemia Panel: No results for input(s): "VITAMINB12", "FOLATE", "FERRITIN", "TIBC", "IRON", "RETICCTPCT" in the last 72 hours.   Radiology: MR BRAIN WO CONTRAST  Result Date: 03/12/2023 CLINICAL DATA:  Right facial droop EXAM: MRI HEAD WITHOUT CONTRAST MRA HEAD WITHOUT CONTRAST MRA NECK WITHOUT CONTRAST TECHNIQUE: Multiplanar, multiecho pulse sequences of the brain and surrounding structures were obtained without intravenous contrast. Angiographic images of the Circle of Willis were obtained using MRA technique without intravenous contrast. Angiographic images of the neck were obtained using MRA technique without intravenous contrast. Carotid stenosis measurements (when applicable) are obtained utilizing NASCET criteria, using the distal internal carotid diameter as the denominator. COMPARISON:  01/27/2023 FINDINGS: MRI HEAD FINDINGS Brain: Multiple scattered bilateral small acute infarcts. The largest lesions are located in the right cerebellum, left temporal lobe and  left basal ganglia. Aside from the  right cerebellar lesion, all lesions are on the left. Multiple chronic microhemorrhages in a predominantly central distribution. There is confluent hyperintense T2-weighted signal within the white matter. Generalized volume loss. Ex vacuo dilatation of the right lateral ventricle. The midline structures are normal. Vascular: Major flow voids are preserved. Skull and upper cervical spine: Normal calvarium and skull base. Visualized upper cervical spine and soft tissues are normal. Sinuses/Orbits:No paranasal sinus fluid levels or advanced mucosal thickening. No mastoid or middle ear effusion. Normal orbits. MRA HEAD FINDINGS POSTERIOR CIRCULATION: --Vertebral arteries: Loss of flow related enhancement within the left V4 segment. Normal left PICA. --Inferior cerebellar arteries: Normal. --Basilar artery: Normal. --Superior cerebellar arteries: Normal. --Posterior cerebral arteries: Normal. The right PCA is predominantly supplied by the posterior communicating artery. ANTERIOR CIRCULATION: --Intracranial internal carotid arteries: Atherosclerotic irregularity of the internal carotid arteries at the skull base, left-greater-than-right. --Anterior cerebral arteries (ACA): Normal. --Middle cerebral arteries (MCA): Normal. MRA NECK FINDINGS Motion degraded MRA of the neck. Aortic arch: Unremarkable Right carotid system: There is signal loss of the distal common carotid artery. The right ICA is patent. Left carotid system: There is atherosclerotic irregularity throughout the left carotid system with an area of signal loss near the carotid bifurcation. Vertebral arteries: Right dominant.  No stenosis. IMPRESSION: 1. Multiple scattered bilateral small acute infarcts. The largest lesions are located in the right cerebellum, left temporal lobe and left basal ganglia. No hemorrhage or mass effect. 2. Loss of flow related enhancement within the left vertebral artery V4 segment, consistent with  slow flow or occlusion. 3. Signal loss of the distal right common carotid artery and left internal carotid artery near the carotid bifurcation. This may be exaggerated by motion artifact, but there is likely a component of stenosis or occlusion. CTA of the neck may be helpful for further characterization. 4. 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 Electronically Signed   By: Deatra Robinson M.D.   On: 03/12/2023 21:48   MR ANGIO NECK WO CONTRAST  Result Date: 03/12/2023 CLINICAL DATA:  Right facial droop EXAM: MRI HEAD WITHOUT CONTRAST MRA HEAD WITHOUT CONTRAST MRA NECK WITHOUT CONTRAST TECHNIQUE: Multiplanar, multiecho pulse sequences of the brain and surrounding structures were obtained without intravenous contrast. Angiographic images of the Circle of Willis were obtained using MRA technique without intravenous contrast. Angiographic images of the neck were obtained using MRA technique without intravenous contrast. Carotid stenosis measurements (when applicable) are obtained utilizing NASCET criteria, using the distal internal carotid diameter as the denominator. COMPARISON:  01/27/2023 FINDINGS: MRI HEAD FINDINGS Brain: Multiple scattered bilateral small acute infarcts. The largest lesions are located in the right cerebellum, left temporal lobe and left basal ganglia. Aside from the right cerebellar lesion, all lesions are on the left. Multiple chronic microhemorrhages in a predominantly central distribution. There is confluent hyperintense T2-weighted signal within the white matter. Generalized volume loss. Ex vacuo dilatation of the right lateral ventricle. The midline structures are normal. Vascular: Major flow voids are preserved. Skull and upper cervical spine: Normal calvarium and skull base. Visualized upper cervical spine and soft tissues are normal. Sinuses/Orbits:No paranasal sinus fluid levels or advanced mucosal  thickening. No mastoid or middle ear effusion. Normal orbits. MRA HEAD FINDINGS POSTERIOR CIRCULATION: --Vertebral arteries: Loss of flow related enhancement within the left V4 segment. Normal left PICA. --Inferior cerebellar arteries: Normal. --Basilar artery: Normal. --Superior cerebellar arteries: Normal. --Posterior cerebral arteries: Normal. The right PCA is predominantly supplied by the posterior communicating artery.  ANTERIOR CIRCULATION: --Intracranial internal carotid arteries: Atherosclerotic irregularity of the internal carotid arteries at the skull base, left-greater-than-right. --Anterior cerebral arteries (ACA): Normal. --Middle cerebral arteries (MCA): Normal. MRA NECK FINDINGS Motion degraded MRA of the neck. Aortic arch: Unremarkable Right carotid system: There is signal loss of the distal common carotid artery. The right ICA is patent. Left carotid system: There is atherosclerotic irregularity throughout the left carotid system with an area of signal loss near the carotid bifurcation. Vertebral arteries: Right dominant.  No stenosis. IMPRESSION: 1. Multiple scattered bilateral small acute infarcts. The largest lesions are located in the right cerebellum, left temporal lobe and left basal ganglia. No hemorrhage or mass effect. 2. Loss of flow related enhancement within the left vertebral artery V4 segment, consistent with slow flow or occlusion. 3. Signal loss of the distal right common carotid artery and left internal carotid artery near the carotid bifurcation. This may be exaggerated by motion artifact, but there is likely a component of stenosis or occlusion. CTA of the neck may be helpful for further characterization. 4. 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 Electronically Signed   By: Deatra Robinson M.D.   On: 03/12/2023 21:48   MR ANGIO HEAD WO CONTRAST  Result Date: 03/12/2023 CLINICAL DATA:  Right  facial droop EXAM: MRI HEAD WITHOUT CONTRAST MRA HEAD WITHOUT CONTRAST MRA NECK WITHOUT CONTRAST TECHNIQUE: Multiplanar, multiecho pulse sequences of the brain and surrounding structures were obtained without intravenous contrast. Angiographic images of the Circle of Willis were obtained using MRA technique without intravenous contrast. Angiographic images of the neck were obtained using MRA technique without intravenous contrast. Carotid stenosis measurements (when applicable) are obtained utilizing NASCET criteria, using the distal internal carotid diameter as the denominator. COMPARISON:  01/27/2023 FINDINGS: MRI HEAD FINDINGS Brain: Multiple scattered bilateral small acute infarcts. The largest lesions are located in the right cerebellum, left temporal lobe and left basal ganglia. Aside from the right cerebellar lesion, all lesions are on the left. Multiple chronic microhemorrhages in a predominantly central distribution. There is confluent hyperintense T2-weighted signal within the white matter. Generalized volume loss. Ex vacuo dilatation of the right lateral ventricle. The midline structures are normal. Vascular: Major flow voids are preserved. Skull and upper cervical spine: Normal calvarium and skull base. Visualized upper cervical spine and soft tissues are normal. Sinuses/Orbits:No paranasal sinus fluid levels or advanced mucosal thickening. No mastoid or middle ear effusion. Normal orbits. MRA HEAD FINDINGS POSTERIOR CIRCULATION: --Vertebral arteries: Loss of flow related enhancement within the left V4 segment. Normal left PICA. --Inferior cerebellar arteries: Normal. --Basilar artery: Normal. --Superior cerebellar arteries: Normal. --Posterior cerebral arteries: Normal. The right PCA is predominantly supplied by the posterior communicating artery. ANTERIOR CIRCULATION: --Intracranial internal carotid arteries: Atherosclerotic irregularity of the internal carotid arteries at the skull base,  left-greater-than-right. --Anterior cerebral arteries (ACA): Normal. --Middle cerebral arteries (MCA): Normal. MRA NECK FINDINGS Motion degraded MRA of the neck. Aortic arch: Unremarkable Right carotid system: There is signal loss of the distal common carotid artery. The right ICA is patent. Left carotid system: There is atherosclerotic irregularity throughout the left carotid system with an area of signal loss near the carotid bifurcation. Vertebral arteries: Right dominant.  No stenosis. IMPRESSION: 1. Multiple scattered bilateral small acute infarcts. The largest lesions are located in the right cerebellum, left temporal lobe and left basal ganglia. No hemorrhage or mass effect. 2. Loss of flow related enhancement within the left vertebral artery V4 segment, consistent with  slow flow or occlusion. 3. Signal loss of the distal right common carotid artery and left internal carotid artery near the carotid bifurcation. This may be exaggerated by motion artifact, but there is likely a component of stenosis or occlusion. CTA of the neck may be helpful for further characterization. 4. 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 Electronically Signed   By: Deatra Robinson M.D.   On: 03/12/2023 21:48   CT HEAD CODE STROKE WO CONTRAST  Result Date: 03/12/2023 CLINICAL DATA:  Code stroke. Neuro deficit, acute, stroke suspected. EXAM: CT HEAD WITHOUT CONTRAST TECHNIQUE: Contiguous axial images were obtained from the base of the skull through the vertex without intravenous contrast. RADIATION DOSE REDUCTION: This exam was performed according to the departmental dose-optimization program which includes automated exposure control, adjustment of the mA and/or kV according to patient size and/or use of iterative reconstruction technique. COMPARISON:  MRI brain 01/27/2023. FINDINGS: Brain: No acute intracranial hemorrhage. New area of  hypoattenuation in the left inferior temporal gyrus. Unchanged encephalomalacia in the right posterior frontal lobe and basal ganglia from prior right MCA territory infarct. Unchanged old infarct in the right occipital lobe and severe chronic small-vessel disease. No hydrocephalus or extra-axial collection. No mass effect or midline shift. Vascular: No hyperdense vessel or unexpected calcification. Skull: No calvarial fracture or suspicious bone lesion. Skull base is unremarkable. Sinuses/Orbits: Unremarkable. Other: None. ASPECTS ([place] Stroke Program Early CT Score) - Ganglionic level infarction (caudate, lentiform nuclei, internal capsule, insula, M1-M3 cortex): 7 - Supraganglionic infarction (M4-M6 cortex): 3 Total score (0-10 with 10 being normal): 10 IMPRESSION: 1. New area of hypoattenuation in the left inferior temporal gyrus, concerning for acute infarct. No acute intracranial hemorrhage. 2. ASPECT score is 10. Code stroke imaging results were communicated on 03/12/2023 at 4:44 pm to provider Dr. Amada Jupiter via secure text paging. Electronically Signed   By: Orvan Falconer M.D.   On: 03/12/2023 16:45   US Venous Img Upper Uni Right(DVT)  Result Date: 03/12/2023 CLINICAL DATA:  Right upper extremity pain and edema. EXAM: RIGHT UPPER EXTREMITY VENOUS DOPPLER ULTRASOUND TECHNIQUE: Gray-scale sonography with graded compression, as well as color Doppler and duplex ultrasound were performed to evaluate the upper extremity deep venous system from the level of the subclavian vein and including the jugular, axillary, basilic, radial, ulnar and upper cephalic vein. Spectral Doppler was utilized to evaluate flow at rest and with distal augmentation maneuvers. COMPARISON:  None Available. FINDINGS: Contralateral Subclavian Vein: Respiratory phasicity is normal and symmetric with the symptomatic side. No evidence of thrombus. Normal compressibility. Internal Jugular Vein: No evidence of thrombus. Normal  compressibility, respiratory phasicity and response to augmentation. Subclavian Vein: No evidence of thrombus. Normal compressibility, respiratory phasicity and response to augmentation. Axillary Vein: No evidence of thrombus. Normal compressibility, respiratory phasicity and response to augmentation. Cephalic Vein: Superficial thrombophlebitis of a segment of the right cephalic vein in the forearm with associated venous distension. Basilic Vein: No evidence of thrombus. Normal compressibility, respiratory phasicity and response to augmentation. Brachial Veins: No evidence of thrombus. Normal compressibility, respiratory phasicity and response to augmentation. Radial Veins: No evidence of thrombus. Normal compressibility, respiratory phasicity and response to augmentation. Ulnar Veins: No evidence of thrombus. Normal compressibility, respiratory phasicity and response to augmentation. Venous Reflux:  None visualized. Other Findings:  No abnormal fluid collections identified. IMPRESSION: 1. No evidence of DVT within the right upper extremity. 2. Superficial thrombophlebitis of a segment of the right cephalic vein in  the forearm. Electronically Signed   By: Irish Lack M.D.   On: 03/12/2023 12:24   US RENAL  Result Date: 03/11/2023 CLINICAL DATA:  6274 Acute kidney failure, unspecified (HCC) [id number] Chronic kidney disease (CKD) stage G3a/A1, moderately decreased glomerular filtration rate (GFR) between 45-59 mL/min EXAM: RENAL / URINARY TRACT ULTRASOUND COMPLETE COMPARISON:  03/08/2023 FINDINGS: Right Kidney: Renal measurements: 9.6 x 4.3 x 5.2 cm = volume: 109 mL. Parenchyma is hypoechoic compared to adjacent liver. 1.6 x 1.5 cm cortical cyst partially exophytic mid kidney. No hydronephrosis. Left Kidney: Renal measurements: 10.6 x 5.2 x 5.2 cm = volume: 152 mL. 2.3 x 1.7 x 2 cm simple cyst mid kidney. No hydronephrosis. Bladder: Physiologically distended. Other: None. IMPRESSION: 1. No hydronephrosis.  2. Bilateral simple renal cysts. Electronically Signed   By: Corlis Leak M.D.   On: 03/11/2023 16:20   US RENAL ARTERY DUPLEX LIMITED  Result Date: 03/08/2023 CLINICAL DATA:  Malignant hypertension EXAM: RENAL/URINARY TRACT ULTRASOUND RENAL DUPLEX DOPPLER ULTRASOUND COMPARISON:  CT 04/05/2008 FINDINGS: Right Kidney: Length: 9.8 x 4.8 x 5.3 cm (128 cc). Isoechoic to adjacent liver. 1.6 x 1.3 x 1.2 cm midpole cyst. No hydronephrosis. Left Kidney: Length: 10.6 x 5.5 x 4.7 cm (144 cc). No hydronephrosis. 2.3 x 2 cm lower pole cortical cyst. Bladder:  Not imaged RENAL DUPLEX ULTRASOUND Right Renal Artery Velocities: Origin:  43.8 cm/sec Mid:  47.6 cm/sec [place]:  43.2 cm/sec Left Renal Artery Velocities: Origin:  70.2 cm/sec Mid:  67.8 cm/sec [place]:  67.7 cm/sec Aortic Velocity:  141 cm/sec All renal-aortic ratios are well under 1.0. Technologist describes technically difficult study secondary to bowel gas and patient inability to hold breath. IMPRESSION: 1. No Doppler ultrasound evidence of hemodynamically significant renal artery stenosis. 2. If there is continued clinical concern, renal MRA (lower radiation risk, can be performed noncontrast in the setting of renal dysfunction) and CTA ( higher spatial resolution) represent more accurate studies, which are additionally more sensitive to the detection of duplicated renal arteries. Electronically Signed   By: Corlis Leak M.D.   On: 03/08/2023 18:26   DG Chest Port 1 View  Result Date: 03/08/2023 CLINICAL DATA:  Hypotension EXAM: PORTABLE CHEST 1 VIEW COMPARISON:  08/22/2007 FINDINGS: Single frontal view of the chest demonstrates an enlarged cardiac silhouette. There is atherosclerosis of the thoracic aorta, bilateral subclavian arteries, and bilateral carotid arteries. No acute airspace disease, effusion, or pneumothorax. No acute bony abnormalities. IMPRESSION: 1. Enlarged cardiac silhouette. 2. No acute airspace disease. 3. Extensive atherosclerosis.  Electronically Signed   By: Sharlet Salina M.D.   On: 03/08/2023 14:53    ECHO  INTERPRETATION  NORMAL LEFT VENTRICULAR SYSTOLIC FUNCTION   WITH MODERATE LVH  NORMAL RIGHT VENTRICULAR SYSTOLIC FUNCTION  MILD VALVULAR REGURGITATION (See above)  NO VALVULAR STENOSIS  IRREGULAR HEART RHYTHM CAPTURED THROUGHOUT EXAM  ESTIMATED LVEF >55%  CALCULATED: 58.3%  GLS: -16.7%  MILD MR, TR, PI  SEVERE LAE  _________________________________________________________________________________________  Electronically signed by    Dorothyann Peng, MD on 01/08/2023 04: 59 PM           Performed By: Verdis Prime     Ordering Physician: Dorothyann Peng, MD   Steffanie Dunn 01/08/2023 Impression   Moderately abnormal myocardial perfusion scan there is evidence of left ventricular enlargement there is mild /moderate anterior apical defect of moderate intensity with mixed defect concerning for infarct versus ischemia.  Overall ejection fraction around 45% with anterior apical hypokinesis.  This is a moderate to  high risk and recommend consider further evaluation invasively possibly cardiac cath  TELEMETRY reviewed by me [facility]) 03/13/2023 : Junctional bradycardia rate 43 with periods of wide-complex idioventricular rhythm with rates in 70-80s.  EKG reviewed by me: junctional brady with nonspecific IVCD and nonspecific T wave changes  Data reviewed by me [facility]) 03/13/2023: Neurology note, nursing notes, PCCM notes, vascular surgery notes , last 24h vitals tele labs imaging I/O   Principal Problem:   Malignant hypertension    ASSESSMENT AND PLAN:  Shatiya "Sandy" J. Schlageter is an 34yoF with a PMH of HTN, bilateral carotid stenosis (70% RICA, 80% LICA), hx CVA/TIA, HFpEF (55%), abnormal lexiscan myoview (01/08/23), sinus arrhythmia who presented to [facility] for elective left carotid endarterectomy with vascular surgery. The patient's BP was extremely elevated and procedure was cancelled and was admitted for BP  control.  Cardiology is consulted for further assistance.  # CVA Code stroke called at 1630 yesterday for new onset R sided facial drooping and R arm weakness. CT positive, MRI demonstrated multiple bilateral small acute infarcts with largest lesions in the R cerebellum, L temporal lobe, and L basal ganglia.  -Holding Heparin. Continue Aspirin 81 mg daily and Pravastatin 40 mg daily.  -Neurology following.  -Repeat echo ordered for updated evaluation given new stroke.  -Continue to monitor on telemetry for arrhythmias. Plan to send home with 30 day monitor.  # Malignant hypertension # Left carotid artery stenosis # Junctional bradycardia  Suspect the patient's presyncopal symptoms and minimal responsiveness afternoon of 5/16 were iatrogenic from aggressive measures to control her BP with rapid blood pressure fluctuations from 280/70s down to as low as 90s/60s.  Symptoms improved after her BP rose back to 150 systolic.  She has explicitly denied chest pain, SOB, nausea, dizziness and is currently asymptomatic despite her bradycardia.  -Agree with current therapy per PCCM and vascular surgery -No indication for temporary transvenous pacemaker or permanent pacemaker at this time -Clonidine decreased to 0.1 mg tid by PCCM yesterday for symptomatic BP of 128/42. Continue Amlodipine 5 mg. Monitor for worsening bradycardia. Holding nephrotoxic medications given renal function.  -Vascular surgery plans to defer CEA during this hospitalization. -Secondary hypertension workup pending, renal duplex negative -Monitor and replenish electrolytes for goal K >4, mag >2 -Defer additional cardiac diagnostics at this time   # Demand Ischemia vs ACS # Hx Abnormal Lexiscan Myoview Patient has a history of abnormal Lexiscan Myoview in 2021 (anterolateral defect with border zone redistribution, mild LV dilatation during stress and moderately reduced EF of 45-50%).  Most recently in April 2024 with moderate anterior  apical hypokinesis with a mixed fixed versus reversible defect, it seems that since the patient was asymptomatic, chest pain-free further invasive cardiac diagnostics were deferred. -Patient remains without chest pain, shortness of breath, dizziness, nausea, diaphoresis.  -Troponins this admission 353 on 5/17 repeating on 5/20 and trended 2765 > 3149 > 2667. Repeat EKG today reviewed by Dr. Juliann Pares. -Suspect troponin elevated 2/2 significantly labile BP. EKGs do show nonspecific IVCD and nonspecific T wave changes which, in the absence of chest pain and in the setting of very labile BPs is non-diagnostic.   -Patient received heparin for about 5 hours before this was discontinued given concern for acute stroke. -As above, defer additional cardiac diagnostics at this time   #AKI on CKD Stage IIIa Cr today 3.46 from 3.37 yesterday.  -Appreciate nephrology input, continue holding nephrotoxic medications.    # Sinus arrhythmia The patient carries a diagnosis of paroxysmal atrial  fibrillation in her chart likely from an EKG obtained on 5/7 which the computer read as atrial fibrillation. EKG reviewed by Dr. Juliann Pares and Jodie Echevaria, PA who agree that the computer read is erroneous, and the rhythm is sinus bradycardia with premature atrial contractions.  She does not have an indication for chronic anticoagulation or a diagnosis of atrial fibrillation based on this EKG tracing.  This patient's plan of care was discussed and created with Dr. Juliann Pares  and he is in agreement.  Signed: Gale Journey , PA-C 03/13/2023, 10:58 AM [facility]

## 2023-03-14 DIAGNOSIS — I6523 Occlusion and stenosis of bilateral carotid arteries: Secondary | ICD-10-CM | POA: Diagnosis not present

## 2023-03-14 DIAGNOSIS — N179 Acute kidney failure, unspecified: Secondary | ICD-10-CM | POA: Diagnosis not present

## 2023-03-14 DIAGNOSIS — I639 Cerebral infarction, unspecified: Secondary | ICD-10-CM | POA: Diagnosis not present

## 2023-03-14 DIAGNOSIS — I10 Essential (primary) hypertension: Secondary | ICD-10-CM | POA: Diagnosis not present

## 2023-03-14 LAB — BASIC METABOLIC PANEL
Anion gap: 9 (ref 5–15)
BUN: 77 mg/dL — ABNORMAL HIGH (ref 8–23)
CO2: 23 mmol/L (ref 22–32)
Calcium: 8.2 mg/dL — ABNORMAL LOW (ref 8.9–10.3)
Chloride: 101 mmol/L (ref 98–111)
Creatinine, Ser: 2.54 mg/dL — ABNORMAL HIGH (ref 0.44–1.00)
GFR, Estimated: 19 mL/min — ABNORMAL LOW (ref >60)
Glucose, Bld: 156 mg/dL — ABNORMAL HIGH (ref 70–99)
Potassium: 4.3 mmol/L (ref 3.5–5.1)
Sodium: 133 mmol/L — ABNORMAL LOW (ref 135–145)

## 2023-03-14 LAB — CBC WITH DIFFERENTIAL/PLATELET
Abs Immature Granulocytes: 0.1 10*3/uL — ABNORMAL HIGH (ref 0.00–0.07)
Basophils Absolute: 0 10*3/uL (ref 0.0–0.1)
Basophils Relative: 0 %
Eosinophils Absolute: 0 10*3/uL (ref 0.0–0.5)
Eosinophils Relative: 0 %
HCT: 32.9 % — ABNORMAL LOW (ref 36.0–46.0)
Hemoglobin: 11 g/dL — ABNORMAL LOW (ref 12.0–15.0)
Immature Granulocytes: 1 %
Lymphocytes Relative: 5 %
Lymphs Abs: 1.1 10*3/uL (ref 0.7–4.0)
MCH: 31 pg (ref 26.0–34.0)
MCHC: 33.4 g/dL (ref 30.0–36.0)
MCV: 92.7 fL (ref 80.0–100.0)
Monocytes Absolute: 1 10*3/uL (ref 0.1–1.0)
Monocytes Relative: 5 %
Neutro Abs: 18.1 10*3/uL — ABNORMAL HIGH (ref 1.7–7.7)
Neutrophils Relative %: 89 %
Platelets: 217 10*3/uL (ref 150–400)
RBC: 3.55 MIL/uL — ABNORMAL LOW (ref 3.87–5.11)
RDW: 14 % (ref 11.5–15.5)
WBC: 20.3 10*3/uL — ABNORMAL HIGH (ref 4.0–10.5)
nRBC: 0 % (ref 0.0–0.2)

## 2023-03-14 LAB — PHOSPHORUS: Phosphorus: 4.4 mg/dL (ref 2.5–4.6)

## 2023-03-14 LAB — GLUCOSE, CAPILLARY
Glucose-Capillary: 142 mg/dL — ABNORMAL HIGH (ref 70–99)
Glucose-Capillary: 147 mg/dL — ABNORMAL HIGH (ref 70–99)
Glucose-Capillary: 160 mg/dL — ABNORMAL HIGH (ref 70–99)
Glucose-Capillary: 140 mg/dL — ABNORMAL HIGH (ref 70–99)

## 2023-03-14 LAB — MAGNESIUM: Magnesium: 2.2 mg/dL (ref 1.7–2.4)

## 2023-03-14 MED ORDER — CLONIDINE HCL 0.1 MG PO TABS
0.1000 mg | ORAL_TABLET | Freq: Two times a day (BID) | ORAL | Status: AC
  Administered 2023-03-15 – 2023-03-16 (×3): 0.1 mg via ORAL
  Filled 2023-03-14 (×3): qty 1

## 2023-03-14 MED ORDER — CLONIDINE HCL 0.1 MG PO TABS: 0.1000 mg | ORAL_TABLET | Freq: Two times a day (BID) | ORAL | Status: DC

## 2023-03-14 MED ORDER — AMLODIPINE BESYLATE 10 MG PO TABS
10.0000 mg | ORAL_TABLET | Freq: Every day | ORAL | Status: DC
  Administered 2023-03-14 – 2023-03-22 (×9): 10 mg via ORAL
  Filled 2023-03-14 (×9): qty 1

## 2023-03-14 MED ORDER — HYDRALAZINE HCL 20 MG/ML IJ SOLN
20.0000 mg | Freq: Four times a day (QID) | INTRAMUSCULAR | Status: DC | PRN
  Administered 2023-03-14 – 2023-03-22 (×8): 20 mg via INTRAVENOUS
  Filled 2023-03-14 (×8): qty 1

## 2023-03-14 MED ORDER — HEPARIN SODIUM (PORCINE) 5000 UNIT/ML IJ SOLN
5000.0000 [IU] | Freq: Three times a day (TID) | INTRAMUSCULAR | Status: DC
  Administered 2023-03-14 – 2023-03-21 (×21): 5000 [IU] via SUBCUTANEOUS
  Filled 2023-03-14 (×21): qty 1

## 2023-03-14 MED ORDER — CLONIDINE HCL 0.1 MG/24HR TD PTWK
0.1000 mg | MEDICATED_PATCH | TRANSDERMAL | Status: DC
  Administered 2023-03-14: 0.1 mg via TRANSDERMAL
  Filled 2023-03-14: qty 1

## 2023-03-14 MED ORDER — AMLODIPINE BESYLATE 5 MG PO TABS: 5.0000 mg | ORAL_TABLET | Freq: Once | ORAL | Status: DC

## 2023-03-14 NOTE — Progress Notes (Signed)
[facility] CLINIC CARDIOLOGY CONSULT NOTE       Patient ID: Karina Leonard MRN: [id number] DOB/AGE: Mar 16, 1941 82 y.o.  Admit date: 03/08/2023 Referring Physician Zada Girt, NP  Primary Physician Dr. Elizabeth Sauer Primary Cardiologist Dr. Juliann Pares Reason for Consultation labile BP, junctional bradycardia   HPI: Karina Leonard is an 51yoF with a PMH of HTN, bilateral carotid stenosis (70% RICA, 80% LICA), hx CVA/TIA, HFpEF (55%), abnormal lexiscan myoview (01/08/23), sinus arrhythmia who presented to [facility] for elective left carotid endarterectomy with vascular surgery. The patient's BP was extremely elevated and procedure was cancelled and was admitted for BP control.  Cardiology is consulted for further assistance.  Interval History:  -Patient states she is feeling well overall. She does endorse some chest discomfort that is worse with certain positions and with palpation. Was given tylenol last night for this which helped.  -Cr improved to 2.5 today from 3.4.  -Discussed results of echo with patient and daughter Karina Leonard -Blood pressure elevated in the 190 systolic this morning  Review of systems complete and found to be negative unless listed above    Past Medical History:  Diagnosis Date   Allergy    Aortic atherosclerosis (HCC)    Atrial fibrillation with slow ventricular response (HCC) 02/27/2023   a.) noted on preoperative ECG 02/27/2023; A.fib at 52 bpm with LVH and (+) inferolateral TWIs; b.) CHA2DS2VASc = 7 (age x 2, sex, HTN, CVA/TIA x2, vascular disease history); c.) rate/rhythm maintained on oral metoprolol; chronically antithrombotic therapy (ASA + clopidogrel)   Bilateral carotid artery disease (HCC)    a.) doppler 01/25/2023: 60% RICA, 70% LICA   Bradycardia    Cataract cortical, senile    Cerebrovascular disease    Coronary artery disease involving native coronary artery of native heart without angina pectoris    a.) MPI 05/24/2020: EF 45-50%. Borderline  ant/lat defect with border zone redistribution; b.) MPI 01/08/2023: EF 45%; mild-mod mixed ant apical defect c/w infarct vs. ischemia   DDD (degenerative disc disease), lumbar    Depression    Diastolic dysfunction    a.) TTE 01/08/2023: EF >55%, mod LVH, sev LAE, mild MR/TR/PR, G1DD   Full dentures    Hyperlipidemia    Hypertension    Keloid    Lipoma    Long term current use of antithrombotics/antiplatelets    a.) on DAPT (ASA + clopidogrel)   Mild cognitive impairment    Pre-diabetes    Sciatica    Stroke [employer])    a.) MRI brain 03/19/2020: multiple old small vessel infarcts of the basal ganglia, thalamus and pons   TIA (transient ischemic attack)    Urinary incontinence     Past Surgical History:  Procedure Laterality Date   CHOLECYSTECTOMY  2009   COLONOSCOPY     RECTAL POLYPECTOMY     benign    Medications Prior to Admission  Medication Sig Dispense Refill Last Dose   aspirin EC 81 MG tablet Take 81 mg by mouth daily. Take while off of the plavix for surgey   03/07/2023   cloNIDine (CATAPRES - DOSED IN MG/24 HR) 0.2 mg/24hr patch Place 0.2 mg onto the skin once a week. Sunday   Past Week   fexofenadine (ALLEGRA) 180 MG tablet Take 1 tablet (180 mg total) by mouth daily. otc 90 tablet 1 Past Week   fluticasone (FLONASE) 50 MCG/ACT nasal spray Place 1 spray into both nostrils daily. otc   Past Week   furosemide (LASIX) 20 MG tablet  Take 20 mg by mouth daily. callwood   Past Week   losartan-hydrochlorothiazide (HYZAAR) 100-25 MG tablet Take 1 tablet by mouth daily. 90 tablet 1 03/07/2023   lovastatin (MEVACOR) 20 MG tablet TAKE 2 TABLETS EVERY DAY (Patient taking differently: Take 2 tablets by mouth at bedtime. TAKE 2 TABLETS EVERY DAY) 180 tablet 1 03/07/2023   metoprolol succinate (TOPROL-XL) 50 MG 24 hr tablet Take 1 tablet (50 mg total) by mouth daily. [place] 90 tablet 0 03/08/2023 at 0500   venlafaxine XR (EFFEXOR-XR) 75 MG 24 hr capsule Take 1 capsule (75 mg total) by mouth  daily. (Patient taking differently: Take 75 mg by mouth at bedtime.) 90 capsule 1 03/07/2023   clopidogrel (PLAVIX) 75 MG tablet Take 1 tablet (75 mg total) by mouth daily. 90 tablet 1 03/01/2023   Social History   Socioeconomic History   Marital status: Widowed    Spouse name: Not on file   Number of children: 3   Years of education: some college   Highest education level: 12th grade  Occupational History   Occupation: Retired  Tobacco Use   Smoking status: Former    Packs/day: 1.00    Years: 20.00    Additional pack years: 0.00    Total pack years: 20.00    Types: Cigarettes    Quit date: 32    Years since quitting: 57.4   Smokeless tobacco: Never  Vaping Use   Vaping Use: Never used  Substance and Sexual Activity   Alcohol use: Yes    Alcohol/week: 0.0 standard drinks of alcohol    Comment: special occasions   Drug use: No   Sexual activity: Never  Other Topics Concern   Not on file  Social History Narrative   Lives alone   Social Determinants of Health   Financial Resource Strain: Low Risk  (03/22/2022)   Overall Financial Resource Strain (CARDIA)    Difficulty of Paying Living Expenses: Not hard at all  Food Insecurity: No Food Insecurity (03/08/2023)   Hunger Vital Sign    Worried About Running Out of Food in the Last Year: Never true    Ran Out of Food in the Last Year: Never true  Transportation Needs: No Transportation Needs (03/08/2023)   PRAPARE - Administrator, Civil Service (Medical): No    Lack of Transportation (Non-Medical): No  Physical Activity: Insufficiently Active (03/22/2022)   Exercise Vital Sign    Days of Exercise per Week: 3 days    Minutes of Exercise per Session: 30 min  Stress: No Stress Concern Present (03/22/2022)   [employer] of Occupational Health - Occupational Stress Questionnaire    Feeling of Stress : Not at all  Social Connections: Moderately Isolated (03/22/2022)   Social Connection and Isolation Panel  [NHANES]    Frequency of Communication with Friends and Family: More than three times a week    Frequency of Social Gatherings with Friends and Family: Twice a week    Attends Religious Services: More than 4 times per year    Active Member of [employer] or Organizations: No    Attends Banker Meetings: Never    Marital Status: Widowed  Intimate Partner Violence: Not At Risk (03/08/2023)   Humiliation, Afraid, Rape, and Kick questionnaire    Fear of Current or Ex-Partner: No    Emotionally Abused: No    Physically Abused: No    Sexually Abused: No    Family History  Problem Relation Age of Onset  Cancer Mother        uterine or ovarian. Unable to remember   Cancer Father        lung   Heart attack Sister       Intake/Output Summary (Last 24 hours) at 03/14/2023 1328 Last data filed at 03/14/2023 0545 Gross per 24 hour  Intake 480 ml  Output 600 ml  Net -120 ml    Vitals:   03/14/23 0900 03/14/23 0910 03/14/23 1000 03/14/23 1100  BP: (!) 181/46  (!) 155/50 (!) 171/45  Pulse:  (!) 50 (!) 55 (!) 53  Resp: (!) 23 18 13  (!) 24  Temp: 98.1 F (36.7 C)     TempSrc: Oral     SpO2:  94% 97% 95%  Weight:      Height:        PHYSICAL EXAM General: Pleasant elderly black female, well nourished, in no acute distress. Laying flat in hospital bed with daughter present.   HEENT:  Normocephalic and atraumatic. Neck:  No JVD.  Lungs: Normal respiratory effort on room air. Clear bilaterally to auscultation. No wheezes, crackles, rhonchi.  Heart: Bradycardic but regular. Normal S1 and S2 without gallops or murmurs.  Abdomen: Non-distended appearing.  Msk: Normal strength and tone for age.  Tenderness to palpation along lower chest wall. Extremities: Warm and well perfused. No clubbing, cyanosis.  No peripheral edema.  Neuro: Alert and oriented X 3. No residual facial drooping, weakness.  Psych:  Answers questions appropriately.   Labs: Basic Metabolic Panel: Recent Labs     03/13/23 0409 03/14/23 0519  NA 132* 133*  K 3.9 4.3  CL 102 101  CO2 22 23  GLUCOSE 123* 156*  BUN 73* 77*  CREATININE 3.46* 2.54*  CALCIUM 7.6* 8.2*  MG 2.1 2.2  PHOS 5.6* 4.4   Liver Function Tests: Recent Labs    03/12/23 0507  ALBUMIN 2.7*    No results for input(s): "LIPASE", "AMYLASE" in the last 72 hours. CBC: Recent Labs    03/13/23 0409 03/14/23 0519  WBC 12.3* 20.3*  NEUTROABS  --  18.1*  HGB 9.8* 11.0*  HCT 30.2* 32.9*  MCV 94.7 92.7  PLT 173 217   Cardiac Enzymes: Recent Labs    03/12/23 0857 03/12/23 1102 03/13/23 0409  TROPONINIHS 3,149* 2,667* 1,734*   BNP: No results for input(s): "BNP" in the last 72 hours.  D-Dimer: No results for input(s): "DDIMER" in the last 72 hours. Hemoglobin A1C: No results for input(s): "HGBA1C" in the last 72 hours.  Fasting Lipid Panel: Recent Labs    03/13/23 0409  CHOL 113  HDL 39*  LDLCALC 59  TRIG 74  CHOLHDL 2.9   Thyroid Function Tests: No results for input(s): "TSH", "T4TOTAL", "T3FREE", "THYROIDAB" in the last 72 hours.  Invalid input(s): "FREET3"  Anemia Panel: No results for input(s): "VITAMINB12", "FOLATE", "FERRITIN", "TIBC", "IRON", "RETICCTPCT" in the last 72 hours.   Radiology: US Carotid Bilateral  Result Date: 03/13/2023 CLINICAL DATA:  Carotid stenosis EXAM: BILATERAL CAROTID DUPLEX ULTRASOUND TECHNIQUE: Wallace Cullens scale imaging, color Doppler and duplex ultrasound were performed of bilateral carotid and vertebral arteries in the neck. COMPARISON:  None Available. FINDINGS: Criteria: Quantification of carotid stenosis is based on velocity parameters that correlate the residual internal carotid diameter with NASCET-based stenosis levels, using the diameter of the distal internal carotid lumen as the denominator for stenosis measurement. The following velocity measurements were obtained: RIGHT ICA: 116/16 cm/sec CCA: 121/19 cm/sec SYSTOLIC ICA/CCA RATIO:  1.0 ECA:  50 cm/sec LEFT ICA: 95/23  cm/sec CCA: 218/27 cm/sec SYSTOLIC ICA/CCA RATIO:  0.4 ECA:  105 cm/sec RIGHT CAROTID ARTERY: Extensive calcified atherosclerotic plaque throughout the visualized carotid artery. By peak systolic velocity criteria, estimated stenosis is less than 50%. RIGHT VERTEBRAL ARTERY:  Patent with normal antegrade flow. LEFT CAROTID ARTERY: Calcified atherosclerotic plaque throughout the visualized carotid system. By peak systolic velocity criteria, the estimated stenosis is less than 50%. LEFT VERTEBRAL ARTERY:  Patent with normal antegrade flow. IMPRESSION: 1. Mild (1-49%) stenosis proximal right internal carotid artery secondary to extensive echogenic/calcified atherosclerotic plaque. 2. Mild (1-49%) stenosis proximal left internal carotid artery secondary to extensive echogenic/calcified atherosclerotic plaque. 3. Vertebral arteries are patent with normal antegrade flow. Electronically Signed   By: Malachy Moan M.D.   On: 03/13/2023 15:15   ECHOCARDIOGRAM COMPLETE  Result Date: 03/13/2023    ECHOCARDIOGRAM REPORT   Patient Name:   NESSIE HOBERMAN Date of Exam: 03/13/2023 Medical Rec #:  [id number]       Height:       67.0 in Accession #:    [id number]      Weight:       194.2 lb Date of Birth:  13-Sep-1941       BSA:          1.997 m Patient Age:    82 years        BP:           165/44 mmHg Patient Gender: F               HR:           46 bpm. Exam Location:  [facility] Procedure: 2D Echo, Cardiac Doppler and Color Doppler Indications:     Stroke  History:         Patient has no prior history of Echocardiogram examinations.                  CAD, Stroke; Risk Factors:Hypertension and Diabetes.  Sonographer:     Mikki Harbor Referring Phys:  [id number] Neldon Newport NELSON Diagnosing Phys: Yvonne Kendall MD IMPRESSIONS  1. Left ventricular ejection fraction, by estimation, is >75%. The left ventricle has hyperdynamic function. The left ventricle has no regional wall motion abnormalities. There is severe left ventricular  hypertrophy. Left ventricular diastolic parameters are indeterminate.  2. Right ventricular systolic function is moderately reduced. There is relative sparing of the apex that can be seen in the setting of acute cor pulmonale (e.g. acute pulmonary embolism; McConnell's sign). The right ventricular size is mildly enlarged. There is normal pulmonary artery systolic pressure.  3. Left atrial size was moderately dilated.  4. Right atrial size was moderately dilated.  5. The mitral valve is normal in structure. Trivial mitral valve regurgitation.  6. Tricuspid valve regurgitation is moderate.  7. The aortic valve is tricuspid. Aortic valve regurgitation is not visualized. No aortic stenosis is present.  8. The inferior vena cava is normal in size with greater than 50% respiratory variability, suggesting right atrial pressure of 3 mmHg. FINDINGS  Left Ventricle: Left ventricular ejection fraction, by estimation, is >75%. The left ventricle has hyperdynamic function. The left ventricle has no regional wall motion abnormalities. The left ventricular internal cavity size was normal in size. There is severe left ventricular hypertrophy. Left ventricular diastolic parameters are indeterminate. Right Ventricle: The right ventricular size is mildly enlarged. No increase in right ventricular wall thickness. Right ventricular systolic function is moderately reduced. There is normal  pulmonary artery systolic pressure. The tricuspid regurgitant velocity is 1.84 m/s, and with an assumed right atrial pressure of 3 mmHg, the estimated right ventricular systolic pressure is 16.5 mmHg. Left Atrium: Left atrial size was moderately dilated. Right Atrium: Right atrial size was moderately dilated. Pericardium: There is no evidence of pericardial effusion. Mitral Valve: The mitral valve is normal in structure. Mild mitral annular calcification. Trivial mitral valve regurgitation. MV peak gradient, 2.6 mmHg. The mean mitral valve gradient is  0.0 mmHg. Tricuspid Valve: The tricuspid valve is normal in structure. Tricuspid valve regurgitation is moderate. Aortic Valve: The aortic valve is tricuspid. Aortic valve regurgitation is not visualized. No aortic stenosis is present. Aortic valve mean gradient measures 2.0 mmHg. Aortic valve peak gradient measures 5.1 mmHg. Aortic valve area, by VTI measures 2.18 cm. Pulmonic Valve: The pulmonic valve was normal in structure. Pulmonic valve regurgitation is trivial. Aorta: The aortic root is normal in size and structure. Pulmonary Artery: The pulmonary artery is of normal size. Venous: The inferior vena cava is normal in size with greater than 50% respiratory variability, suggesting right atrial pressure of 3 mmHg. IAS/Shunts: No atrial level shunt detected by color flow Doppler.  LEFT VENTRICLE PLAX 2D LVIDd:         4.10 cm LVIDs:         2.00 cm LV PW:         1.90 cm LV IVS:        1.90 cm LVOT diam:     2.00 cm LV SV:         61 LV SV Index:   31 LVOT Area:     3.14 cm  RIGHT VENTRICLE RV Basal diam:  4.05 cm RV Mid diam:    3.00 cm RV S prime:     5.01 cm/s LEFT ATRIUM              Index        RIGHT ATRIUM           Index LA diam:        4.60 cm  2.30 cm/m   RA Area:     27.10 cm LA Vol (A2C):   107.0 ml 53.57 ml/m  RA Volume:   83.70 ml  41.91 ml/m LA Vol (A4C):   103.0 ml 51.57 ml/m LA Biplane Vol: 108.0 ml 54.08 ml/m  AORTIC VALVE                    PULMONIC VALVE AV Area (Vmax):    2.46 cm     PV Vmax:       0.47 m/s AV Area (Vmean):   2.16 cm     PV Peak grad:  0.9 mmHg AV Area (VTI):     2.18 cm AV Vmax:           113.00 cm/s AV Vmean:          68.900 cm/s AV VTI:            0.281 m AV Peak Grad:      5.1 mmHg AV Mean Grad:      2.0 mmHg LVOT Vmax:         88.60 cm/s LVOT Vmean:        47.300 cm/s LVOT VTI:          0.195 m LVOT/AV VTI ratio: 0.69  AORTA Ao Root diam: 3.00 cm MITRAL VALVE  TRICUSPID VALVE MV Area (PHT): 3.17 cm    TR Peak grad:   13.5 mmHg MV Area VTI:   2.63  cm    TR Vmax:        184.00 cm/s MV Peak grad:  2.6 mmHg MV Mean grad:  0.0 mmHg    SHUNTS MV Vmax:       0.81 m/s    Systemic VTI:  0.20 m MV Vmean:      26.9 cm/s   Systemic Diam: 2.00 cm MV Decel Time: 239 msec MV E velocity: 70.30 cm/s Yvonne Kendall MD Electronically signed by Yvonne Kendall MD Signature Date/Time: 03/13/2023/1:12:28 PM    Final    MR BRAIN WO CONTRAST  Result Date: 03/12/2023 CLINICAL DATA:  Right facial droop EXAM: MRI HEAD WITHOUT CONTRAST MRA HEAD WITHOUT CONTRAST MRA NECK WITHOUT CONTRAST TECHNIQUE: Multiplanar, multiecho pulse sequences of the brain and surrounding structures were obtained without intravenous contrast. Angiographic images of the Circle of Willis were obtained using MRA technique without intravenous contrast. Angiographic images of the neck were obtained using MRA technique without intravenous contrast. Carotid stenosis measurements (when applicable) are obtained utilizing NASCET criteria, using the distal internal carotid diameter as the denominator. COMPARISON:  01/27/2023 FINDINGS: MRI HEAD FINDINGS Brain: Multiple scattered bilateral small acute infarcts. The largest lesions are located in the right cerebellum, left temporal lobe and left basal ganglia. Aside from the right cerebellar lesion, all lesions are on the left. Multiple chronic microhemorrhages in a predominantly central distribution. There is confluent hyperintense T2-weighted signal within the white matter. Generalized volume loss. Ex vacuo dilatation of the right lateral ventricle. The midline structures are normal. Vascular: Major flow voids are preserved. Skull and upper cervical spine: Normal calvarium and skull base. Visualized upper cervical spine and soft tissues are normal. Sinuses/Orbits:No paranasal sinus fluid levels or advanced mucosal thickening. No mastoid or middle ear effusion. Normal orbits. MRA HEAD FINDINGS POSTERIOR CIRCULATION: --Vertebral arteries: Loss of flow related  enhancement within the left V4 segment. Normal left PICA. --Inferior cerebellar arteries: Normal. --Basilar artery: Normal. --Superior cerebellar arteries: Normal. --Posterior cerebral arteries: Normal. The right PCA is predominantly supplied by the posterior communicating artery. ANTERIOR CIRCULATION: --Intracranial internal carotid arteries: Atherosclerotic irregularity of the internal carotid arteries at the skull base, left-greater-than-right. --Anterior cerebral arteries (ACA): Normal. --Middle cerebral arteries (MCA): Normal. MRA NECK FINDINGS Motion degraded MRA of the neck. Aortic arch: Unremarkable Right carotid system: There is signal loss of the distal common carotid artery. The right ICA is patent. Left carotid system: There is atherosclerotic irregularity throughout the left carotid system with an area of signal loss near the carotid bifurcation. Vertebral arteries: Right dominant.  No stenosis. IMPRESSION: 1. Multiple scattered bilateral small acute infarcts. The largest lesions are located in the right cerebellum, left temporal lobe and left basal ganglia. No hemorrhage or mass effect. 2. Loss of flow related enhancement within the left vertebral artery V4 segment, consistent with slow flow or occlusion. 3. Signal loss of the distal right common carotid artery and left internal carotid artery near the carotid bifurcation. This may be exaggerated by motion artifact, but there is likely a component of stenosis or occlusion. CTA of the neck may be helpful for further characterization. 4. 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 Electronically Signed   By: Deatra Robinson M.D.   On: 03/12/2023 21:48   MR ANGIO NECK WO CONTRAST  Result Date: 03/12/2023 CLINICAL DATA:  Right facial droop  EXAM: MRI HEAD WITHOUT CONTRAST MRA HEAD WITHOUT CONTRAST MRA NECK WITHOUT CONTRAST TECHNIQUE: Multiplanar, multiecho pulse sequences  of the brain and surrounding structures were obtained without intravenous contrast. Angiographic images of the Circle of Willis were obtained using MRA technique without intravenous contrast. Angiographic images of the neck were obtained using MRA technique without intravenous contrast. Carotid stenosis measurements (when applicable) are obtained utilizing NASCET criteria, using the distal internal carotid diameter as the denominator. COMPARISON:  01/27/2023 FINDINGS: MRI HEAD FINDINGS Brain: Multiple scattered bilateral small acute infarcts. The largest lesions are located in the right cerebellum, left temporal lobe and left basal ganglia. Aside from the right cerebellar lesion, all lesions are on the left. Multiple chronic microhemorrhages in a predominantly central distribution. There is confluent hyperintense T2-weighted signal within the white matter. Generalized volume loss. Ex vacuo dilatation of the right lateral ventricle. The midline structures are normal. Vascular: Major flow voids are preserved. Skull and upper cervical spine: Normal calvarium and skull base. Visualized upper cervical spine and soft tissues are normal. Sinuses/Orbits:No paranasal sinus fluid levels or advanced mucosal thickening. No mastoid or middle ear effusion. Normal orbits. MRA HEAD FINDINGS POSTERIOR CIRCULATION: --Vertebral arteries: Loss of flow related enhancement within the left V4 segment. Normal left PICA. --Inferior cerebellar arteries: Normal. --Basilar artery: Normal. --Superior cerebellar arteries: Normal. --Posterior cerebral arteries: Normal. The right PCA is predominantly supplied by the posterior communicating artery. ANTERIOR CIRCULATION: --Intracranial internal carotid arteries: Atherosclerotic irregularity of the internal carotid arteries at the skull base, left-greater-than-right. --Anterior cerebral arteries (ACA): Normal. --Middle cerebral arteries (MCA): Normal. MRA NECK FINDINGS Motion degraded MRA of the  neck. Aortic arch: Unremarkable Right carotid system: There is signal loss of the distal common carotid artery. The right ICA is patent. Left carotid system: There is atherosclerotic irregularity throughout the left carotid system with an area of signal loss near the carotid bifurcation. Vertebral arteries: Right dominant.  No stenosis. IMPRESSION: 1. Multiple scattered bilateral small acute infarcts. The largest lesions are located in the right cerebellum, left temporal lobe and left basal ganglia. No hemorrhage or mass effect. 2. Loss of flow related enhancement within the left vertebral artery V4 segment, consistent with slow flow or occlusion. 3. Signal loss of the distal right common carotid artery and left internal carotid artery near the carotid bifurcation. This may be exaggerated by motion artifact, but there is likely a component of stenosis or occlusion. CTA of the neck may be helpful for further characterization. 4. 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 Electronically Signed   By: Deatra Robinson M.D.   On: 03/12/2023 21:48   MR ANGIO HEAD WO CONTRAST  Result Date: 03/12/2023 CLINICAL DATA:  Right facial droop EXAM: MRI HEAD WITHOUT CONTRAST MRA HEAD WITHOUT CONTRAST MRA NECK WITHOUT CONTRAST TECHNIQUE: Multiplanar, multiecho pulse sequences of the brain and surrounding structures were obtained without intravenous contrast. Angiographic images of the Circle of Willis were obtained using MRA technique without intravenous contrast. Angiographic images of the neck were obtained using MRA technique without intravenous contrast. Carotid stenosis measurements (when applicable) are obtained utilizing NASCET criteria, using the distal internal carotid diameter as the denominator. COMPARISON:  01/27/2023 FINDINGS: MRI HEAD FINDINGS Brain: Multiple scattered bilateral small acute infarcts. The largest lesions are located in  the right cerebellum, left temporal lobe and left basal ganglia. Aside from the right cerebellar lesion, all lesions are on the left. Multiple chronic microhemorrhages in a predominantly central distribution. There is confluent hyperintense  T2-weighted signal within the white matter. Generalized volume loss. Ex vacuo dilatation of the right lateral ventricle. The midline structures are normal. Vascular: Major flow voids are preserved. Skull and upper cervical spine: Normal calvarium and skull base. Visualized upper cervical spine and soft tissues are normal. Sinuses/Orbits:No paranasal sinus fluid levels or advanced mucosal thickening. No mastoid or middle ear effusion. Normal orbits. MRA HEAD FINDINGS POSTERIOR CIRCULATION: --Vertebral arteries: Loss of flow related enhancement within the left V4 segment. Normal left PICA. --Inferior cerebellar arteries: Normal. --Basilar artery: Normal. --Superior cerebellar arteries: Normal. --Posterior cerebral arteries: Normal. The right PCA is predominantly supplied by the posterior communicating artery. ANTERIOR CIRCULATION: --Intracranial internal carotid arteries: Atherosclerotic irregularity of the internal carotid arteries at the skull base, left-greater-than-right. --Anterior cerebral arteries (ACA): Normal. --Middle cerebral arteries (MCA): Normal. MRA NECK FINDINGS Motion degraded MRA of the neck. Aortic arch: Unremarkable Right carotid system: There is signal loss of the distal common carotid artery. The right ICA is patent. Left carotid system: There is atherosclerotic irregularity throughout the left carotid system with an area of signal loss near the carotid bifurcation. Vertebral arteries: Right dominant.  No stenosis. IMPRESSION: 1. Multiple scattered bilateral small acute infarcts. The largest lesions are located in the right cerebellum, left temporal lobe and left basal ganglia. No hemorrhage or mass effect. 2. Loss of flow related enhancement within the left  vertebral artery V4 segment, consistent with slow flow or occlusion. 3. Signal loss of the distal right common carotid artery and left internal carotid artery near the carotid bifurcation. This may be exaggerated by motion artifact, but there is likely a component of stenosis or occlusion. CTA of the neck may be helpful for further characterization. 4. 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 Electronically Signed   By: Deatra Robinson M.D.   On: 03/12/2023 21:48   CT HEAD CODE STROKE WO CONTRAST  Result Date: 03/12/2023 CLINICAL DATA:  Code stroke. Neuro deficit, acute, stroke suspected. EXAM: CT HEAD WITHOUT CONTRAST TECHNIQUE: Contiguous axial images were obtained from the base of the skull through the vertex without intravenous contrast. RADIATION DOSE REDUCTION: This exam was performed according to the departmental dose-optimization program which includes automated exposure control, adjustment of the mA and/or kV according to patient size and/or use of iterative reconstruction technique. COMPARISON:  MRI brain 01/27/2023. FINDINGS: Brain: No acute intracranial hemorrhage. New area of hypoattenuation in the left inferior temporal gyrus. Unchanged encephalomalacia in the right posterior frontal lobe and basal ganglia from prior right MCA territory infarct. Unchanged old infarct in the right occipital lobe and severe chronic small-vessel disease. No hydrocephalus or extra-axial collection. No mass effect or midline shift. Vascular: No hyperdense vessel or unexpected calcification. Skull: No calvarial fracture or suspicious bone lesion. Skull base is unremarkable. Sinuses/Orbits: Unremarkable. Other: None. ASPECTS ([place]berta Stroke Program Early CT Score) - Ganglionic level infarction (caudate, lentiform nuclei, internal capsule, insula, M1-M3 cortex): 7 - Supraganglionic infarction (M4-M6 cortex): 3 Total score (0-10 with 10  being normal): 10 IMPRESSION: 1. New area of hypoattenuation in the left inferior temporal gyrus, concerning for acute infarct. No acute intracranial hemorrhage. 2. ASPECT score is 10. Code stroke imaging results were communicated on 03/12/2023 at 4:44 pm to provider Dr. Amada Jupiter via secure text paging. Electronically Signed   By: Orvan Falconer M.D.   On: 03/12/2023 16:45   US Venous Img Upper Uni Right(DVT)  Result Date: 03/12/2023 CLINICAL DATA:  Right upper extremity pain and edema. EXAM:  RIGHT UPPER EXTREMITY VENOUS DOPPLER ULTRASOUND TECHNIQUE: Gray-scale sonography with graded compression, as well as color Doppler and duplex ultrasound were performed to evaluate the upper extremity deep venous system from the level of the subclavian vein and including the jugular, axillary, basilic, radial, ulnar and upper cephalic vein. Spectral Doppler was utilized to evaluate flow at rest and with distal augmentation maneuvers. COMPARISON:  None Available. FINDINGS: Contralateral Subclavian Vein: Respiratory phasicity is normal and symmetric with the symptomatic side. No evidence of thrombus. Normal compressibility. Internal Jugular Vein: No evidence of thrombus. Normal compressibility, respiratory phasicity and response to augmentation. Subclavian Vein: No evidence of thrombus. Normal compressibility, respiratory phasicity and response to augmentation. Axillary Vein: No evidence of thrombus. Normal compressibility, respiratory phasicity and response to augmentation. Cephalic Vein: Superficial thrombophlebitis of a segment of the right cephalic vein in the forearm with associated venous distension. Basilic Vein: No evidence of thrombus. Normal compressibility, respiratory phasicity and response to augmentation. Brachial Veins: No evidence of thrombus. Normal compressibility, respiratory phasicity and response to augmentation. Radial Veins: No evidence of thrombus. Normal compressibility, respiratory phasicity and  response to augmentation. Ulnar Veins: No evidence of thrombus. Normal compressibility, respiratory phasicity and response to augmentation. Venous Reflux:  None visualized. Other Findings:  No abnormal fluid collections identified. IMPRESSION: 1. No evidence of DVT within the right upper extremity. 2. Superficial thrombophlebitis of a segment of the right cephalic vein in the forearm. Electronically Signed   By: Irish Lack M.D.   On: 03/12/2023 12:24   US RENAL  Result Date: 03/11/2023 CLINICAL DATA:  6274 Acute kidney failure, unspecified (HCC) [id number] Chronic kidney disease (CKD) stage G3a/A1, moderately decreased glomerular filtration rate (GFR) between 45-59 mL/min EXAM: RENAL / URINARY TRACT ULTRASOUND COMPLETE COMPARISON:  03/08/2023 FINDINGS: Right Kidney: Renal measurements: 9.6 x 4.3 x 5.2 cm = volume: 109 mL. Parenchyma is hypoechoic compared to adjacent liver. 1.6 x 1.5 cm cortical cyst partially exophytic mid kidney. No hydronephrosis. Left Kidney: Renal measurements: 10.6 x 5.2 x 5.2 cm = volume: 152 mL. 2.3 x 1.7 x 2 cm simple cyst mid kidney. No hydronephrosis. Bladder: Physiologically distended. Other: None. IMPRESSION: 1. No hydronephrosis. 2. Bilateral simple renal cysts. Electronically Signed   By: Corlis Leak M.D.   On: 03/11/2023 16:20   US RENAL ARTERY DUPLEX LIMITED  Result Date: 03/08/2023 CLINICAL DATA:  Malignant hypertension EXAM: RENAL/URINARY TRACT ULTRASOUND RENAL DUPLEX DOPPLER ULTRASOUND COMPARISON:  CT 04/05/2008 FINDINGS: Right Kidney: Length: 9.8 x 4.8 x 5.3 cm (128 cc). Isoechoic to adjacent liver. 1.6 x 1.3 x 1.2 cm midpole cyst. No hydronephrosis. Left Kidney: Length: 10.6 x 5.5 x 4.7 cm (144 cc). No hydronephrosis. 2.3 x 2 cm lower pole cortical cyst. Bladder:  Not imaged RENAL DUPLEX ULTRASOUND Right Renal Artery Velocities: Origin:  43.8 cm/sec Mid:  47.6 cm/sec Hilum:  43.2 cm/sec Left Renal Artery Velocities: Origin:  70.2 cm/sec Mid:  67.8 cm/sec Hilum:   67.7 cm/sec Aortic Velocity:  141 cm/sec All renal-aortic ratios are well under 1.0. Technologist describes technically difficult study secondary to bowel gas and patient inability to hold breath. IMPRESSION: 1. No Doppler ultrasound evidence of hemodynamically significant renal artery stenosis. 2. If there is continued clinical concern, renal MRA (lower radiation risk, can be performed noncontrast in the setting of renal dysfunction) and CTA ( higher spatial resolution) represent more accurate studies, which are additionally more sensitive to the detection of duplicated renal arteries. Electronically Signed   By: Corlis Leak M.D.   On:  03/08/2023 18:26   DG Chest Port 1 View  Result Date: 03/08/2023 CLINICAL DATA:  Hypotension EXAM: PORTABLE CHEST 1 VIEW COMPARISON:  08/22/2007 FINDINGS: Single frontal view of the chest demonstrates an enlarged cardiac silhouette. There is atherosclerosis of the thoracic aorta, bilateral subclavian arteries, and bilateral carotid arteries. No acute airspace disease, effusion, or pneumothorax. No acute bony abnormalities. IMPRESSION: 1. Enlarged cardiac silhouette. 2. No acute airspace disease. 3. Extensive atherosclerosis. Electronically Signed   By: Sharlet Salina M.D.   On: 03/08/2023 14:53    ECHO  INTERPRETATION  NORMAL LEFT VENTRICULAR SYSTOLIC FUNCTION   WITH MODERATE LVH  NORMAL RIGHT VENTRICULAR SYSTOLIC FUNCTION  MILD VALVULAR REGURGITATION (See above)  NO VALVULAR STENOSIS  IRREGULAR HEART RHYTHM CAPTURED THROUGHOUT EXAM  ESTIMATED LVEF >55%  CALCULATED: 58.3%  GLS: -16.7%  MILD MR, TR, PI  SEVERE LAE  _________________________________________________________________________________________  Electronically signed by    Dorothyann Peng, MD on 01/08/2023 04: 59 PM           Performed By: Verdis Prime     Ordering Physician: Dorothyann Peng, MD   Steffanie Dunn 01/08/2023 Impression   Moderately abnormal myocardial perfusion scan there is evidence  of left ventricular enlargement there is mild /moderate anterior apical defect of moderate intensity with mixed defect concerning for infarct versus ischemia.  Overall ejection fraction around 45% with anterior apical hypokinesis.  This is a moderate to high risk and recommend consider further evaluation invasively possibly cardiac cath  TELEMETRY reviewed by me [facility]) 03/14/2023 : Junctional bradycardia rate 50s with PVCs, with less frequent periods of wide-complex idioventricular rhythm with rates in 70-80s.  EKG reviewed by me: junctional brady with nonspecific IVCD and nonspecific T wave changes  Data reviewed by me [facility]) 03/14/2023: Neurology note, nursing notes, PCCM notes, vascular surgery notes , last 24h vitals tele labs imaging I/O   Principal Problem:   Malignant hypertension    ASSESSMENT AND PLAN:  Karina Leonard is an 79yoF with a PMH of HTN, bilateral carotid stenosis (70% RICA, 80% LICA), hx CVA/TIA, HFpEF (55%), abnormal lexiscan myoview (01/08/23), sinus arrhythmia who presented to [facility] for elective left carotid endarterectomy with vascular surgery. The patient's BP was extremely elevated and procedure was cancelled and was admitted for BP control.  Cardiology is consulted for further assistance.  # CVA Code stroke called at 1630 on 5/20 for new onset R sided facial drooping and R arm weakness. CT positive, MRI demonstrated multiple bilateral small acute infarcts with largest lesions in the R cerebellum, L temporal lobe, and L basal ganglia.  -Holding Heparin. Continue Aspirin 81 mg daily and Pravastatin 40 mg daily.  -Neurology following.  -Repeat echo shows hyperdynamic LV function EF >75% -Continue to monitor on telemetry for arrhythmias, no evidence thus far of atrial fibrillation. Plan to send home with 30 day monitor.  # Malignant hypertension # Left carotid artery stenosis # Junctional bradycardia  Suspect the patient's presyncopal symptoms and minimal  responsiveness afternoon of 5/16 were iatrogenic from aggressive measures to control her BP with rapid blood pressure fluctuations from 280/70s down to as low as 90s/60s.  Symptoms improved after her BP rose back to 150 systolic.  She has explicitly denied SOB, nausea, dizziness and is currently asymptomatic despite her bradycardia.  -Agree with current therapy per hospitalist and vascular surgery -No indication for temporary transvenous pacemaker or permanent pacemaker at this time. Monitor for worsening bradycardia.  -Amlodipine increased to 10 mg for better BP control. Continue Clonidine 0.1  mg tid.  Hydralazine as needed for elevated BP. Holding nephrotoxic medications given renal function.  -Vascular surgery plans to defer CEA during this hospitalization. -Secondary hypertension workup pending, renal duplex negative however vascular does have concern for some level of RAS.  They are planning to work this up outpatient -Monitor and replenish electrolytes for goal K >4, mag >2 -Defer additional cardiac diagnostics at this time   # Demand Ischemia vs ACS # Hx Abnormal Lexiscan Myoview Patient has a history of abnormal Lexiscan Myoview in 2021 (anterolateral defect with border zone redistribution, mild LV dilatation during stress and moderately reduced EF of 45-50%).  Most recently in April 2024 with moderate anterior apical hypokinesis with a mixed fixed versus reversible defect, it seems that since the patient was asymptomatic, chest pain-free further invasive cardiac diagnostics were deferred. -Patient remains without shortness of breath, dizziness, nausea, diaphoresis. Now reporting some chest discomfort that is atypical.  This is reproducible on palpation -Troponins this admission 353 on 5/17 repeating on 5/20 and trended 2765 > 3149 > 2667 > 1734. Repeat EKG 5/20 reviewed by Dr. Juliann Pares. -Suspect troponin elevated 2/2 significantly labile BP. EKGs do show nonspecific IVCD and nonspecific T  wave changes which, in the absence of chest pain and in the setting of very labile BPs is non-diagnostic.   -Patient received heparin for about 5 hours before this was discontinued given concern for acute stroke. -As above, defer additional cardiac diagnostics at this time   #AKI on CKD Stage IIIa Cr today 2.5 from 3.4 yesterday.  -Appreciate nephrology input, continue holding nephrotoxic medications.    # Sinus arrhythmia The patient carries a diagnosis of paroxysmal atrial fibrillation in her chart likely from an EKG obtained on 5/7 which the computer read as atrial fibrillation. EKG reviewed by Dr. Juliann Pares and Jodie Echevaria, PA who agree that the computer read is erroneous, and the rhythm is sinus bradycardia with premature atrial contractions.  She does not have an indication for chronic anticoagulation or a diagnosis of atrial fibrillation based on this EKG tracing.  This patient's plan of care was discussed and created with Dr. Juliann Pares  and he is in agreement.  Signed: Gale Journey , PA-C 03/14/2023, 1:28 PM [facility]

## 2023-03-14 NOTE — Progress Notes (Signed)
PROGRESS NOTE    Karina Leonard  ION:[id number] DOB: 09/04/1941 DOA: 03/08/2023 PCP: Duanne Limerick, MD  Outpatient Specialists: vascular surgery, cardiology    Brief Narrative:   This is an 82 yo female with carotid artery stenosis who presented for an elective left carotid endarterectomy per vascular surgery on 05/16. However, procedure canceled due to severe hypertension sbp 200's. Pt subsequently admitted to the stepdown unit per vascular surgery and nitroglycerin gtt initiated for bp control. Pt also restarted on her outpatient antihypertensives. However, she remained hypertensive and nicardipine gtt added. Pt later developed symptomatic hypotension sbp 90's to low 100's. According to pts daughters pts baseline sbp 140-160's. PCCM team consulted and levophed gtt initiated.  05/16: Pt initially admitted to the stepdown unit with hypertensive urgency and later developed symptomatic hypotension secondary to antihypertensive medication requiring levophed gtt PCCM team consulted to assist with management  05/18: Pt had another episode of accelerated hypertension we have reviewed            medications, cleviprex gtt started overnight. Patient seems to have developed hydralazine        tolerance with rebound effect. Have increased HCTZ to 50 from 25.  Have met with        cardiology - Dr Juliann Pares , amlodipine 5 bid has been initiated. Will consider additional        therapy as needed while weaning down cleviprex gtt.  Her Chronic renal impairment        seems to have acute component with oliguric aki, will get nephrology to evaluate.  05/19: Pt in no distress this am. BP still elevated and still requires cleviprex titration with the PO regimen. Nephrology restarted clonidine due to concern of rebound hypertension following discontinuation of outpatient clonidine patch  05/20: Cleviprex gtt currently off.  SBP improved significantly now 130-150.  Pt now with increasing troponin [phone]  Cardiology following and aware 05/20: MRA Head/Neck: multiple scattered bilateral small acute infarcts. The largest        lesions are located in the right cerebellum, left temporal lobe and left basal ganglia. No        hemorrhage or mass effect. Loss of flow related enhancement within the left vertebral        artery V4 segment, consistent with slow flow or occlusion. Signal loss of the distal right        common carotid artery and left internal carotid artery near the carotid bifurcation. This        may be exaggerated by motion artifact, but there is likely a component of stenosis or        occlusion. CTA of the neck may be helpful for further characterization. Multiple chronic        microhemorrhages in a predominantly central distribution, likely due to chronic        hypertensive microangiopathy. 05/21: Code stroke initiated on 05/20 due to new onset right-sided facial droop and RUE        weakness.  CT Head revealed new area of hypoattenuation in the left inferior temporal        gyrus, concerning for acute infarct. No acute intracranial hemorrhage. ASPECT score is 10.   Assessment & Plan:   Principal Problem:   Malignant hypertension Active Problems:   Coronary artery disease   Carotid stenosis   Diabetes mellitus without complication (HCC)   Acute CVA (cerebrovascular accident) (HCC)   AKI (acute kidney injury) (HCC)  # Hypertensive emergency Now with rebound hypertension  after clonidine was discontinued. BPs improving but still elevated. Cardiology manages her htn as outpt - amlodipine increased to 10 qd today - continue clonidine - vascular advising further outpatient CTA to eval for RAS - home hctz, lasix, losartan on hold 2/2 aki - hyperaldo and pheo labs drawn on 5/20, pending  # CVA, acute Multiple. Neurology following - continue aspirin/statin - cardiac monitor at discharge - endarterectomy planning as below - pt/ot advising home health  # Carotid  stenosis Vascular advising close outpatient f/u to make plans for endarterectomy - continue aspirin/plavix  # AKI on ckd 3a Baseline cr of 1.17, here peaked at 3s, today 2.54, uop 800. Nephrology following.  - monitoring  # Demand ischemia vs nstemi Troponin peak in the 3000s. Currently asymptomatic. Cardiology following, they are deferring additioanl diagnostics - cont asa/statin - close outpt f/u    DVT prophylaxis: heparin Code Status: full Family Communication: daughter updated @ bedside  Level of care: Telemetry Medical Status is: Inpatient Remains inpatient appropriate because: monitoring for renal recovery    Consultants:  Nephrology, cardiology, vascular surgery, neurology  Procedures: none  Antimicrobials:  S/p cipro    Subjective: Feeling find, no new complaints, tolerating diet  Objective: Vitals:   03/14/23 1000 03/14/23 1100 03/14/23 1200 03/14/23 1300  BP: (!) 155/50 (!) 171/45 (!) 167/46 (!) 169/66  Pulse: (!) 55 (!) 53 (!) 47 (!) 55  Resp: 13 (!) 24 (!) 24 (!) 23  Temp:      TempSrc:      SpO2: 97% 95% 94% 95%  Weight:      Height:        Intake/Output Summary (Last 24 hours) at 03/14/2023 1358 Last data filed at 03/14/2023 1300 Gross per 24 hour  Intake 634.39 ml  Output 600 ml  Net 34.39 ml   Filed Weights   03/08/23 0626 03/08/23 0841  Weight: 87.5 kg 88.1 kg    Examination:  General exam: Appears calm and comfortable  Respiratory system: Clear to auscultation. Respiratory effort normal. Cardiovascular system: S1 & S2 heard, RRR. No JVD, murmurs, rubs, gallops or clicks. No pedal edema. Gastrointestinal system: Abdomen is nondistended, soft and nontender. No organomegaly or masses felt. Normal bowel sounds heard. Central nervous system: Alert and oriented. No focal neurological deficits. Extremities: Symmetric 5 x 5 power. Skin: No rashes, lesions or ulcers Psychiatry: Judgement and insight appear normal. Mood & affect  appropriate.     Data Reviewed: I have personally reviewed following labs and imaging studies  CBC: Recent Labs  Lab 03/08/23 1437 03/09/23 0406 03/11/23 0417 03/12/23 0855 03/13/23 0409 03/14/23 0519  WBC 19.6* 20.6* 17.7* 12.8* 12.3* 20.3*  NEUTROABS 17.4*  --   --   --   --  18.1*  HGB 11.9* 11.9* 11.5* 11.2* 9.8* 11.0*  HCT 36.0 35.5* 34.3* 34.5* 30.2* 32.9*  MCV 92.1 92.2 92.2 93.8 94.7 92.7  PLT 172 204 190 187 173 217   Basic Metabolic Panel: Recent Labs  Lab 03/08/23 1436 03/08/23 1437 03/09/23 0406 03/10/23 0453 03/11/23 0417 03/12/23 0507 03/13/23 0409 03/14/23 0519  NA  --    < > 138 136 135 134* 132* 133*  K  --    < > 4.5 3.8 3.7 3.7 3.9 4.3  CL  --    < > 107 106 104 104 102 101  CO2  --    < > 23 21* 23 21* 22 23  GLUCOSE  --    < > 125* 121*  128* 124* 123* 156*  BUN  --    < > 38* 50* 61* 65* 73* 77*  CREATININE  --    < > 2.38* 2.38* 2.64* 3.37* 3.46* 2.54*  CALCIUM  --    < > 8.2* 8.5* 8.2* 7.8* 7.6* 8.2*  MG  --   --  1.6*  --  2.2 2.1 2.1 2.2  PHOS 4.9*  --  3.2  --   --  4.6 5.6* 4.4   < > = values in this interval not displayed.   GFR: Estimated Creatinine Clearance: 20.1 mL/min (A) (by C-G formula based on SCr of 2.54 mg/dL (H)). Liver Function Tests: Recent Labs  Lab 03/08/23 1437 03/09/23 0406 03/12/23 0507  AST 48* 82*  --   ALT 15 37  --   ALKPHOS 77 84  --   BILITOT 1.0 0.9  --   PROT 5.9* 6.5  --   ALBUMIN 3.1* 3.4* 2.7*   No results for input(s): "LIPASE", "AMYLASE" in the last 168 hours. No results for input(s): "AMMONIA" in the last 168 hours. Coagulation Profile: No results for input(s): "INR", "PROTIME" in the last 168 hours. Cardiac Enzymes: No results for input(s): "CKTOTAL", "CKMB", "CKMBINDEX", "TROPONINI" in the last 168 hours. BNP (last 3 results) No results for input(s): "PROBNP" in the last 8760 hours. HbA1C: No results for input(s): "HGBA1C" in the last 72 hours. CBG: Recent Labs  Lab 03/13/23 1107  03/13/23 1648 03/13/23 2159 03/14/23 0752 03/14/23 1140  GLUCAP 152* 120* 150* 142* 147*   Lipid Profile: Recent Labs    03/12/23 0511 03/13/23 0409  CHOL  --  113  HDL  --  39*  LDLCALC  --  59  TRIG 76 74  CHOLHDL  --  2.9   Thyroid Function Tests: No results for input(s): "TSH", "T4TOTAL", "FREET4", "T3FREE", "THYROIDAB" in the last 72 hours. Anemia Panel: No results for input(s): "VITAMINB12", "FOLATE", "FERRITIN", "TIBC", "IRON", "RETICCTPCT" in the last 72 hours. Urine analysis:    Component Value Date/Time   COLORURINE YELLOW (A) 03/11/2023 1637   APPEARANCEUR HAZY (A) 03/11/2023 1637   LABSPEC 1.011 03/11/2023 1637   PHURINE 5.0 03/11/2023 1637   GLUCOSEU NEGATIVE 03/11/2023 1637   HGBUR SMALL (A) 03/11/2023 1637   BILIRUBINUR NEGATIVE 03/11/2023 1637   BILIRUBINUR negative 01/21/2020 1202   KETONESUR NEGATIVE 03/11/2023 1637   PROTEINUR 30 (A) 03/11/2023 1637   UROBILINOGEN 0.2 01/21/2020 1202   NITRITE NEGATIVE 03/11/2023 1637   LEUKOCYTESUR LARGE (A) 03/11/2023 1637   Sepsis Labs: @LABRCNTIP (procalcitonin:4,lacticidven:4)  ) Recent Results (from the past 240 hour(s))  Urine Culture (for pregnant, neutropenic or urologic patients or patients with an indwelling urinary catheter)     Status: Abnormal   Collection Time: 03/12/23  8:11 AM   Specimen: Urine, Random  Result Value Ref Range Status   Specimen Description   Final    URINE, RANDOM Performed at [facility], [address] [place], [place] [address]    Special Requests   Final    NONE Performed at [facility], [address], [place], [place] [address]    Culture (A)  Final    <10,000 COLONIES/mL INSIGNIFICANT GROWTH Performed at [facility], [address], [place], [place] [address]    Report Status 03/13/2023 FINAL  Final         Radiology Studies: US Carotid Bilateral  Result Date: 03/13/2023 CLINICAL DATA:  Carotid stenosis EXAM: BILATERAL CAROTID  DUPLEX ULTRASOUND TECHNIQUE: Wallace Cullens  scale imaging, color Doppler and duplex ultrasound were performed of bilateral carotid and vertebral arteries in the neck. COMPARISON:  None Available. FINDINGS: Criteria: Quantification of carotid stenosis is based on velocity parameters that correlate the residual internal carotid diameter with NASCET-based stenosis levels, using the diameter of the distal internal carotid lumen as the denominator for stenosis measurement. The following velocity measurements were obtained: RIGHT ICA: 116/16 cm/sec CCA: 121/19 cm/sec SYSTOLIC ICA/CCA RATIO:  1.0 ECA:  50 cm/sec LEFT ICA: 95/23 cm/sec CCA: 218/27 cm/sec SYSTOLIC ICA/CCA RATIO:  0.4 ECA:  105 cm/sec RIGHT CAROTID ARTERY: Extensive calcified atherosclerotic plaque throughout the visualized carotid artery. By peak systolic velocity criteria, estimated stenosis is less than 50%. RIGHT VERTEBRAL ARTERY:  Patent with normal antegrade flow. LEFT CAROTID ARTERY: Calcified atherosclerotic plaque throughout the visualized carotid system. By peak systolic velocity criteria, the estimated stenosis is less than 50%. LEFT VERTEBRAL ARTERY:  Patent with normal antegrade flow. IMPRESSION: 1. Mild (1-49%) stenosis proximal right internal carotid artery secondary to extensive echogenic/calcified atherosclerotic plaque. 2. Mild (1-49%) stenosis proximal left internal carotid artery secondary to extensive echogenic/calcified atherosclerotic plaque. 3. Vertebral arteries are patent with normal antegrade flow. Electronically Signed   By: Malachy Moan M.D.   On: 03/13/2023 15:15   ECHOCARDIOGRAM COMPLETE  Result Date: 03/13/2023    ECHOCARDIOGRAM REPORT   Patient Name:   ARNESSA ZUBA Date of Exam: 03/13/2023 Medical Rec #:  [id number]       Height:       67.0 in Accession #:    [id number]      Weight:       194.2 lb Date of Birth:  01-Oct-1941       BSA:          1.997 m Patient Age:    82 years        BP:           165/44 mmHg Patient Gender: F                HR:           46 bpm. Exam Location:  [facility] Procedure: 2D Echo, Cardiac Doppler and Color Doppler Indications:     Stroke  History:         Patient has no prior history of Echocardiogram examinations.                  CAD, Stroke; Risk Factors:Hypertension and Diabetes.  Sonographer:     Mikki Harbor Referring Phys:  [id number] Neldon Newport NELSON Diagnosing Phys: Yvonne Kendall MD IMPRESSIONS  1. Left ventricular ejection fraction, by estimation, is >75%. The left ventricle has hyperdynamic function. The left ventricle has no regional wall motion abnormalities. There is severe left ventricular hypertrophy. Left ventricular diastolic parameters are indeterminate.  2. Right ventricular systolic function is moderately reduced. There is relative sparing of the apex that can be seen in the setting of acute cor pulmonale (e.g. acute pulmonary embolism; McConnell's sign). The right ventricular size is mildly enlarged. There is normal pulmonary artery systolic pressure.  3. Left atrial size was moderately dilated.  4. Right atrial size was moderately dilated.  5. The mitral valve is normal in structure. Trivial mitral valve regurgitation.  6. Tricuspid valve regurgitation is moderate.  7. The aortic valve is tricuspid. Aortic valve regurgitation is not visualized. No aortic stenosis is present.  8. The inferior vena cava is normal in size with greater than 50% respiratory variability, suggesting right atrial pressure  of 3 mmHg. FINDINGS  Left Ventricle: Left ventricular ejection fraction, by estimation, is >75%. The left ventricle has hyperdynamic function. The left ventricle has no regional wall motion abnormalities. The left ventricular internal cavity size was normal in size. There is severe left ventricular hypertrophy. Left ventricular diastolic parameters are indeterminate. Right Ventricle: The right ventricular size is mildly enlarged. No increase in right ventricular wall thickness. Right ventricular  systolic function is moderately reduced. There is normal pulmonary artery systolic pressure. The tricuspid regurgitant velocity is 1.84 m/s, and with an assumed right atrial pressure of 3 mmHg, the estimated right ventricular systolic pressure is 16.5 mmHg. Left Atrium: Left atrial size was moderately dilated. Right Atrium: Right atrial size was moderately dilated. Pericardium: There is no evidence of pericardial effusion. Mitral Valve: The mitral valve is normal in structure. Mild mitral annular calcification. Trivial mitral valve regurgitation. MV peak gradient, 2.6 mmHg. The mean mitral valve gradient is 0.0 mmHg. Tricuspid Valve: The tricuspid valve is normal in structure. Tricuspid valve regurgitation is moderate. Aortic Valve: The aortic valve is tricuspid. Aortic valve regurgitation is not visualized. No aortic stenosis is present. Aortic valve mean gradient measures 2.0 mmHg. Aortic valve peak gradient measures 5.1 mmHg. Aortic valve area, by VTI measures 2.18 cm. Pulmonic Valve: The pulmonic valve was normal in structure. Pulmonic valve regurgitation is trivial. Aorta: The aortic root is normal in size and structure. Pulmonary Artery: The pulmonary artery is of normal size. Venous: The inferior vena cava is normal in size with greater than 50% respiratory variability, suggesting right atrial pressure of 3 mmHg. IAS/Shunts: No atrial level shunt detected by color flow Doppler.  LEFT VENTRICLE PLAX 2D LVIDd:         4.10 cm LVIDs:         2.00 cm LV PW:         1.90 cm LV IVS:        1.90 cm LVOT diam:     2.00 cm LV SV:         61 LV SV Index:   31 LVOT Area:     3.14 cm  RIGHT VENTRICLE RV Basal diam:  4.05 cm RV Mid diam:    3.00 cm RV S prime:     5.01 cm/s LEFT ATRIUM              Index        RIGHT ATRIUM           Index LA diam:        4.60 cm  2.30 cm/m   RA Area:     27.10 cm LA Vol (A2C):   107.0 ml 53.57 ml/m  RA Volume:   83.70 ml  41.91 ml/m LA Vol (A4C):   103.0 ml 51.57 ml/m LA Biplane  Vol: 108.0 ml 54.08 ml/m  AORTIC VALVE                    PULMONIC VALVE AV Area (Vmax):    2.46 cm     PV Vmax:       0.47 m/s AV Area (Vmean):   2.16 cm     PV Peak grad:  0.9 mmHg AV Area (VTI):     2.18 cm AV Vmax:           113.00 cm/s AV Vmean:          68.900 cm/s AV VTI:            0.281  m AV Peak Grad:      5.1 mmHg AV Mean Grad:      2.0 mmHg LVOT Vmax:         88.60 cm/s LVOT Vmean:        47.300 cm/s LVOT VTI:          0.195 m LVOT/AV VTI ratio: 0.69  AORTA Ao Root diam: 3.00 cm MITRAL VALVE               TRICUSPID VALVE MV Area (PHT): 3.17 cm    TR Peak grad:   13.5 mmHg MV Area VTI:   2.63 cm    TR Vmax:        184.00 cm/s MV Peak grad:  2.6 mmHg MV Mean grad:  0.0 mmHg    SHUNTS MV Vmax:       0.81 m/s    Systemic VTI:  0.20 m MV Vmean:      26.9 cm/s   Systemic Diam: 2.00 cm MV Decel Time: 239 msec MV E velocity: 70.30 cm/s Yvonne Kendall MD Electronically signed by Yvonne Kendall MD Signature Date/Time: 03/13/2023/1:12:28 PM    Final    MR BRAIN WO CONTRAST  Result Date: 03/12/2023 CLINICAL DATA:  Right facial droop EXAM: MRI HEAD WITHOUT CONTRAST MRA HEAD WITHOUT CONTRAST MRA NECK WITHOUT CONTRAST TECHNIQUE: Multiplanar, multiecho pulse sequences of the brain and surrounding structures were obtained without intravenous contrast. Angiographic images of the Circle of Willis were obtained using MRA technique without intravenous contrast. Angiographic images of the neck were obtained using MRA technique without intravenous contrast. Carotid stenosis measurements (when applicable) are obtained utilizing NASCET criteria, using the distal internal carotid diameter as the denominator. COMPARISON:  01/27/2023 FINDINGS: MRI HEAD FINDINGS Brain: Multiple scattered bilateral small acute infarcts. The largest lesions are located in the right cerebellum, left temporal lobe and left basal ganglia. Aside from the right cerebellar lesion, all lesions are on the left. Multiple chronic microhemorrhages in  a predominantly central distribution. There is confluent hyperintense T2-weighted signal within the white matter. Generalized volume loss. Ex vacuo dilatation of the right lateral ventricle. The midline structures are normal. Vascular: Major flow voids are preserved. Skull and upper cervical spine: Normal calvarium and skull base. Visualized upper cervical spine and soft tissues are normal. Sinuses/Orbits:No paranasal sinus fluid levels or advanced mucosal thickening. No mastoid or middle ear effusion. Normal orbits. MRA HEAD FINDINGS POSTERIOR CIRCULATION: --Vertebral arteries: Loss of flow related enhancement within the left V4 segment. Normal left PICA. --Inferior cerebellar arteries: Normal. --Basilar artery: Normal. --Superior cerebellar arteries: Normal. --Posterior cerebral arteries: Normal. The right PCA is predominantly supplied by the posterior communicating artery. ANTERIOR CIRCULATION: --Intracranial internal carotid arteries: Atherosclerotic irregularity of the internal carotid arteries at the skull base, left-greater-than-right. --Anterior cerebral arteries (ACA): Normal. --Middle cerebral arteries (MCA): Normal. MRA NECK FINDINGS Motion degraded MRA of the neck. Aortic arch: Unremarkable Right carotid system: There is signal loss of the distal common carotid artery. The right ICA is patent. Left carotid system: There is atherosclerotic irregularity throughout the left carotid system with an area of signal loss near the carotid bifurcation. Vertebral arteries: Right dominant.  No stenosis. IMPRESSION: 1. Multiple scattered bilateral small acute infarcts. The largest lesions are located in the right cerebellum, left temporal lobe and left basal ganglia. No hemorrhage or mass effect. 2. Loss of flow related enhancement within the left vertebral artery V4 segment, consistent with slow flow or occlusion. 3. Signal loss of the distal right common carotid  artery and left internal carotid artery near the  carotid bifurcation. This may be exaggerated by motion artifact, but there is likely a component of stenosis or occlusion. CTA of the neck may be helpful for further characterization. 4. 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 Electronically Signed   By: Deatra Robinson M.D.   On: 03/12/2023 21:48   MR ANGIO NECK WO CONTRAST  Result Date: 03/12/2023 CLINICAL DATA:  Right facial droop EXAM: MRI HEAD WITHOUT CONTRAST MRA HEAD WITHOUT CONTRAST MRA NECK WITHOUT CONTRAST TECHNIQUE: Multiplanar, multiecho pulse sequences of the brain and surrounding structures were obtained without intravenous contrast. Angiographic images of the Circle of Willis were obtained using MRA technique without intravenous contrast. Angiographic images of the neck were obtained using MRA technique without intravenous contrast. Carotid stenosis measurements (when applicable) are obtained utilizing NASCET criteria, using the distal internal carotid diameter as the denominator. COMPARISON:  01/27/2023 FINDINGS: MRI HEAD FINDINGS Brain: Multiple scattered bilateral small acute infarcts. The largest lesions are located in the right cerebellum, left temporal lobe and left basal ganglia. Aside from the right cerebellar lesion, all lesions are on the left. Multiple chronic microhemorrhages in a predominantly central distribution. There is confluent hyperintense T2-weighted signal within the white matter. Generalized volume loss. Ex vacuo dilatation of the right lateral ventricle. The midline structures are normal. Vascular: Major flow voids are preserved. Skull and upper cervical spine: Normal calvarium and skull base. Visualized upper cervical spine and soft tissues are normal. Sinuses/Orbits:No paranasal sinus fluid levels or advanced mucosal thickening. No mastoid or middle ear effusion. Normal orbits. MRA HEAD FINDINGS POSTERIOR CIRCULATION: --Vertebral  arteries: Loss of flow related enhancement within the left V4 segment. Normal left PICA. --Inferior cerebellar arteries: Normal. --Basilar artery: Normal. --Superior cerebellar arteries: Normal. --Posterior cerebral arteries: Normal. The right PCA is predominantly supplied by the posterior communicating artery. ANTERIOR CIRCULATION: --Intracranial internal carotid arteries: Atherosclerotic irregularity of the internal carotid arteries at the skull base, left-greater-than-right. --Anterior cerebral arteries (ACA): Normal. --Middle cerebral arteries (MCA): Normal. MRA NECK FINDINGS Motion degraded MRA of the neck. Aortic arch: Unremarkable Right carotid system: There is signal loss of the distal common carotid artery. The right ICA is patent. Left carotid system: There is atherosclerotic irregularity throughout the left carotid system with an area of signal loss near the carotid bifurcation. Vertebral arteries: Right dominant.  No stenosis. IMPRESSION: 1. Multiple scattered bilateral small acute infarcts. The largest lesions are located in the right cerebellum, left temporal lobe and left basal ganglia. No hemorrhage or mass effect. 2. Loss of flow related enhancement within the left vertebral artery V4 segment, consistent with slow flow or occlusion. 3. Signal loss of the distal right common carotid artery and left internal carotid artery near the carotid bifurcation. This may be exaggerated by motion artifact, but there is likely a component of stenosis or occlusion. CTA of the neck may be helpful for further characterization. 4. 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 Electronically Signed   By: Deatra Robinson M.D.   On: 03/12/2023 21:48   MR ANGIO HEAD WO CONTRAST  Result Date: 03/12/2023 CLINICAL DATA:  Right facial droop EXAM: MRI HEAD WITHOUT CONTRAST MRA HEAD WITHOUT CONTRAST MRA NECK WITHOUT CONTRAST TECHNIQUE:  Multiplanar, multiecho pulse sequences of the brain and surrounding structures were obtained without intravenous contrast. Angiographic images of the Circle of Willis were obtained using MRA technique without intravenous contrast. Angiographic images of  the neck were obtained using MRA technique without intravenous contrast. Carotid stenosis measurements (when applicable) are obtained utilizing NASCET criteria, using the distal internal carotid diameter as the denominator. COMPARISON:  01/27/2023 FINDINGS: MRI HEAD FINDINGS Brain: Multiple scattered bilateral small acute infarcts. The largest lesions are located in the right cerebellum, left temporal lobe and left basal ganglia. Aside from the right cerebellar lesion, all lesions are on the left. Multiple chronic microhemorrhages in a predominantly central distribution. There is confluent hyperintense T2-weighted signal within the white matter. Generalized volume loss. Ex vacuo dilatation of the right lateral ventricle. The midline structures are normal. Vascular: Major flow voids are preserved. Skull and upper cervical spine: Normal calvarium and skull base. Visualized upper cervical spine and soft tissues are normal. Sinuses/Orbits:No paranasal sinus fluid levels or advanced mucosal thickening. No mastoid or middle ear effusion. Normal orbits. MRA HEAD FINDINGS POSTERIOR CIRCULATION: --Vertebral arteries: Loss of flow related enhancement within the left V4 segment. Normal left PICA. --Inferior cerebellar arteries: Normal. --Basilar artery: Normal. --Superior cerebellar arteries: Normal. --Posterior cerebral arteries: Normal. The right PCA is predominantly supplied by the posterior communicating artery. ANTERIOR CIRCULATION: --Intracranial internal carotid arteries: Atherosclerotic irregularity of the internal carotid arteries at the skull base, left-greater-than-right. --Anterior cerebral arteries (ACA): Normal. --Middle cerebral arteries (MCA): Normal. MRA NECK  FINDINGS Motion degraded MRA of the neck. Aortic arch: Unremarkable Right carotid system: There is signal loss of the distal common carotid artery. The right ICA is patent. Left carotid system: There is atherosclerotic irregularity throughout the left carotid system with an area of signal loss near the carotid bifurcation. Vertebral arteries: Right dominant.  No stenosis. IMPRESSION: 1. Multiple scattered bilateral small acute infarcts. The largest lesions are located in the right cerebellum, left temporal lobe and left basal ganglia. No hemorrhage or mass effect. 2. Loss of flow related enhancement within the left vertebral artery V4 segment, consistent with slow flow or occlusion. 3. Signal loss of the distal right common carotid artery and left internal carotid artery near the carotid bifurcation. This may be exaggerated by motion artifact, but there is likely a component of stenosis or occlusion. CTA of the neck may be helpful for further characterization. 4. 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 Electronically Signed   By: Deatra Robinson M.D.   On: 03/12/2023 21:48   CT HEAD CODE STROKE WO CONTRAST  Result Date: 03/12/2023 CLINICAL DATA:  Code stroke. Neuro deficit, acute, stroke suspected. EXAM: CT HEAD WITHOUT CONTRAST TECHNIQUE: Contiguous axial images were obtained from the base of the skull through the vertex without intravenous contrast. RADIATION DOSE REDUCTION: This exam was performed according to the departmental dose-optimization program which includes automated exposure control, adjustment of the mA and/or kV according to patient size and/or use of iterative reconstruction technique. COMPARISON:  MRI brain 01/27/2023. FINDINGS: Brain: No acute intracranial hemorrhage. New area of hypoattenuation in the left inferior temporal gyrus. Unchanged encephalomalacia in the right posterior frontal lobe and basal  ganglia from prior right MCA territory infarct. Unchanged old infarct in the right occipital lobe and severe chronic small-vessel disease. No hydrocephalus or extra-axial collection. No mass effect or midline shift. Vascular: No hyperdense vessel or unexpected calcification. Skull: No calvarial fracture or suspicious bone lesion. Skull base is unremarkable. Sinuses/Orbits: Unremarkable. Other: None. ASPECTS [facility] Stroke Program Early CT Score) - Ganglionic level infarction (caudate, lentiform nuclei, internal capsule, insula, M1-M3 cortex): 7 - Supraganglionic infarction (M4-M6 cortex): 3 Total score (0-10 with 10  being normal): 10 IMPRESSION: 1. New area of hypoattenuation in the left inferior temporal gyrus, concerning for acute infarct. No acute intracranial hemorrhage. 2. ASPECT score is 10. Code stroke imaging results were communicated on 03/12/2023 at 4:44 pm to provider Dr. Amada Jupiter via secure text paging. Electronically Signed   By: Orvan Falconer M.D.   On: 03/12/2023 16:45        Scheduled Meds:  amLODipine  10 mg Oral Daily   aspirin EC  81 mg Oral Daily   Chlorhexidine Gluconate Cloth  6 each Topical Daily   cloNIDine  0.1 mg Oral TID   docusate sodium  100 mg Oral BID   fluticasone  1 spray Each Nare Daily   insulin aspart  0-5 Units Subcutaneous QHS   insulin aspart  0-9 Units Subcutaneous TID WC   loratadine  10 mg Oral Daily   oxyCODONE  5 mg Oral Once   pantoprazole  40 mg Oral Daily   pravastatin  40 mg Oral q1800   venlafaxine XR  75 mg Oral QHS   Continuous Infusions:  sodium chloride Stopped (03/09/23 1239)   ciprofloxacin 200 mL/hr at 03/14/23 1300     LOS: 6 days     Silvano Bilis, MD [facility]   If 7PM-7AM, please contact night-coverage [email or web address] Password [facility] 03/14/2023, 1:58 PM

## 2023-03-14 NOTE — Progress Notes (Signed)
Patient is slightly sleepy, but continues to feel better.  She continues to have mild difficulty with word finding, but improved from the first day I evaluated her.  Her ultrasound revealed open carotids bilaterally, I agree with Dr. Wyn Quaker that it is likely severely under calling disease on the left.  My suspicion is that her infarcts were either secondary to her hypertensive crisis or due to rapidly falling blood pressures after being started on medications.  Given the cerebellar stroke, and multifocal appearance, however, I do agree with cardiology with doing a 30-day monitor to check for atrial fibrillation.  If this is negative, then I would simply continue to treat with stabilization of blood pressure, antiplatelet therapy with aspirin, and carotid endarterectomy.  Neurology will be available on an as-needed basis, please call with further questions or concerns.  Ritta Slot, MD [facility] [phone]  If 7pm- 7am, please page neurology on call as listed in AMION.

## 2023-03-14 NOTE — Progress Notes (Signed)
[facility]  Daily Progress Note   Subjective  -   Patient doing well today.  Neuroexam has been stable.  Creatinine is down. Carotid duplex was done yesterday suggesting 1 to 49% stenosis, but this is far less than suspected by her CT angiogram and likely limited by dense calcific plaque.  Objective Vitals:   03/14/23 0700 03/14/23 0800 03/14/23 0900 03/14/23 0910  BP: (!) 196/50 (!) 191/51 (!) 181/46   Pulse: (!) 53 (!) 52  (!) 50  Resp: (!) 23 (!) 23 (!) 23 18  Temp:   98.1 F (36.7 C)   TempSrc:   Oral   SpO2: 96% 96%  94%  Weight:      Height:        Intake/Output Summary (Last 24 hours) at 03/14/2023 1017 Last data filed at 03/14/2023 0545 Gross per 24 hour  Intake 722.09 ml  Output 685 ml  Net 37.09 ml    PULM  CTAB CV  slightly bradycardic   Laboratory CBC    Component Value Date/Time   WBC 20.3 (H) 03/14/2023 0519   HGB 11.0 (L) 03/14/2023 0519   HGB 12.5 01/31/2022 1054   HCT 32.9 (L) 03/14/2023 0519   HCT 37.8 01/31/2022 1054   PLT 217 03/14/2023 0519   PLT 214 01/31/2022 1054    BMET    Component Value Date/Time   NA 133 (L) 03/14/2023 0519   NA 143 02/02/2023 1114   K 4.3 03/14/2023 0519   CL 101 03/14/2023 0519   CO2 23 03/14/2023 0519   GLUCOSE 156 (H) 03/14/2023 0519   BUN 77 (H) 03/14/2023 0519   BUN 16 02/02/2023 1114   CREATININE 2.54 (H) 03/14/2023 0519   CALCIUM 8.2 (L) 03/14/2023 0519   GFRNONAA 19 (L) 03/14/2023 0519   GFRAA 60 01/22/2020 1140    Assessment/Planning:   Bilateral carotid stenosis with bilateral recent stroke.  Had planned left carotid endarterectomy last week but this had to be deferred due to malignant hypertension.  Her carotid duplex yesterday was woefully inaccurate in comparison to her CT angiogram from last month, and this is likely due to the dense calcific plaque making duplex much less accurate. At some point, will likely still need her left carotid endarterectomy in the coming  weeks, but will need to have her blood pressure under better control and I will do a formal renal artery duplex in the office as the limited one here at the hospital was negative, but I still have a high clinical suspicion. Malignant hypertension.  Still with high clinical suspicion of possible renal artery stenosis.  Would not plan any angiogram or intervention with acute kidney injury.  Outpatient workup with formal renal artery duplex.  Blood pressure has been better.  She seems to tolerate blood pressures in the 160 systolic range best clinically Acute kidney injury.  Creatinine is much better today at 2.5.  Was 3.6 yesterday.  Making adequate urine.  Nephrology is following   Festus Barren  03/14/2023, 10:17 AM

## 2023-03-14 NOTE — Progress Notes (Addendum)
[facility]  ROUNDING NOTE   Subjective:   Patient seen sitting up in bed, alert and oriented 1 daughter at bedside with additional daughter entering during visit Patient reports appropriate appetite Remains on 2 L nasal cannula No lower extremity edema  Creatinine 2.54 Urine output 870 mL  Objective:  Vital signs in last 24 hours:  Temp:  [98 F (36.7 C)-98.8 F (37.1 C)] 98.1 F (36.7 C) (05/22 0900) Pulse Rate:  [40-67] 55 (05/22 1000) Resp:  [10-25] 13 (05/22 1000) BP: (109-209)/(41-131) 155/50 (05/22 1000) SpO2:  [89 %-99 %] 97 % (05/22 1000)  Weight change:  Filed Weights   03/08/23 0626 03/08/23 0841  Weight: 87.5 kg 88.1 kg    Intake/Output: I/O last 3 completed shifts: In: 1162.1 [P.O.:1140; IV Piggyback:22.1] Out: 870 [Urine:870]   Intake/Output this shift:  No intake/output data recorded.  Physical Exam: General: NAD, sitting up in bed  Head: Normocephalic, atraumatic. Moist oral mucosal membranes  Eyes: Anicteric  Lungs:  Clear on auscultation   Heart: Regular rate and rhythm  Abdomen:  Soft, nontender  Extremities:  No peripheral edema.  Neurologic: Alert and oriented, moving all four extremities  Skin: No lesions  Access: None    Basic Metabolic Panel: Recent Labs  Lab 03/08/23 1436 03/08/23 1437 03/09/23 0406 03/10/23 0453 03/11/23 0417 03/12/23 0507 03/13/23 0409 03/14/23 0519  NA  --    < > 138 136 135 134* 132* 133*  K  --    < > 4.5 3.8 3.7 3.7 3.9 4.3  CL  --    < > 107 106 104 104 102 101  CO2  --    < > 23 21* 23 21* 22 23  GLUCOSE  --    < > 125* 121* 128* 124* 123* 156*  BUN  --    < > 38* 50* 61* 65* 73* 77*  CREATININE  --    < > 2.38* 2.38* 2.64* 3.37* 3.46* 2.54*  CALCIUM  --    < > 8.2* 8.5* 8.2* 7.8* 7.6* 8.2*  MG  --   --  1.6*  --  2.2 2.1 2.1 2.2  PHOS 4.9*  --  3.2  --   --  4.6 5.6* 4.4   < > = values in this interval not displayed.     Liver Function Tests: Recent Labs  Lab 03/08/23 1437  03/09/23 0406 03/12/23 0507  AST 48* 82*  --   ALT 15 37  --   ALKPHOS 77 84  --   BILITOT 1.0 0.9  --   PROT 5.9* 6.5  --   ALBUMIN 3.1* 3.4* 2.7*    No results for input(s): "LIPASE", "AMYLASE" in the last 168 hours. No results for input(s): "AMMONIA" in the last 168 hours.  CBC: Recent Labs  Lab 03/08/23 1437 03/09/23 0406 03/11/23 0417 03/12/23 0855 03/13/23 0409 03/14/23 0519  WBC 19.6* 20.6* 17.7* 12.8* 12.3* 20.3*  NEUTROABS 17.4*  --   --   --   --  18.1*  HGB 11.9* 11.9* 11.5* 11.2* 9.8* 11.0*  HCT 36.0 35.5* 34.3* 34.5* 30.2* 32.9*  MCV 92.1 92.2 92.2 93.8 94.7 92.7  PLT 172 204 190 187 173 217     Cardiac Enzymes: No results for input(s): "CKTOTAL", "CKMB", "CKMBINDEX", "TROPONINI" in the last 168 hours.  BNP: Invalid input(s): "POCBNP"  CBG: Recent Labs  Lab 03/13/23 0730 03/13/23 1107 03/13/23 1648 03/13/23 2159 03/14/23 0752  GLUCAP 137* 152* 120* 150* 142*  Microbiology: Results for orders placed or performed during the hospital encounter of 03/08/23  Urine Culture (for pregnant, neutropenic or urologic patients or patients with an indwelling urinary catheter)     Status: Abnormal   Collection Time: 03/12/23  8:11 AM   Specimen: Urine, Random  Result Value Ref Range Status   Specimen Description   Final    URINE, RANDOM Performed at [facility], [address], [place], [place] [address]    Special Requests   Final    NONE Performed at [facility], [address] [place], [place] [address]    Culture (A)  Final    <10,000 COLONIES/mL INSIGNIFICANT GROWTH Performed at [facility], [address], [place], [place] [address]    Report Status 03/13/2023 FINAL  Final    Coagulation Studies: No results for input(s): "LABPROT", "INR" in the last 72 hours.  Urinalysis: Recent Labs    03/11/23 1637  COLORURINE YELLOW*  LABSPEC 1.011  PHURINE 5.0  GLUCOSEU NEGATIVE  HGBUR SMALL*  BILIRUBINUR  NEGATIVE  KETONESUR NEGATIVE  PROTEINUR 30*  NITRITE NEGATIVE  LEUKOCYTESUR LARGE*       Imaging: US Carotid Bilateral  Result Date: 03/13/2023 CLINICAL DATA:  Carotid stenosis EXAM: BILATERAL CAROTID DUPLEX ULTRASOUND TECHNIQUE: Wallace Cullens scale imaging, color Doppler and duplex ultrasound were performed of bilateral carotid and vertebral arteries in the neck. COMPARISON:  None Available. FINDINGS: Criteria: Quantification of carotid stenosis is based on velocity parameters that correlate the residual internal carotid diameter with NASCET-based stenosis levels, using the diameter of the distal internal carotid lumen as the denominator for stenosis measurement. The following velocity measurements were obtained: RIGHT ICA: 116/16 cm/sec CCA: 121/19 cm/sec SYSTOLIC ICA/CCA RATIO:  1.0 ECA:  50 cm/sec LEFT ICA: 95/23 cm/sec CCA: 218/27 cm/sec SYSTOLIC ICA/CCA RATIO:  0.4 ECA:  105 cm/sec RIGHT CAROTID ARTERY: Extensive calcified atherosclerotic plaque throughout the visualized carotid artery. By peak systolic velocity criteria, estimated stenosis is less than 50%. RIGHT VERTEBRAL ARTERY:  Patent with normal antegrade flow. LEFT CAROTID ARTERY: Calcified atherosclerotic plaque throughout the visualized carotid system. By peak systolic velocity criteria, the estimated stenosis is less than 50%. LEFT VERTEBRAL ARTERY:  Patent with normal antegrade flow. IMPRESSION: 1. Mild (1-49%) stenosis proximal right internal carotid artery secondary to extensive echogenic/calcified atherosclerotic plaque. 2. Mild (1-49%) stenosis proximal left internal carotid artery secondary to extensive echogenic/calcified atherosclerotic plaque. 3. Vertebral arteries are patent with normal antegrade flow. Electronically Signed   By: Malachy Moan M.D.   On: 03/13/2023 15:15   ECHOCARDIOGRAM COMPLETE  Result Date: 03/13/2023    ECHOCARDIOGRAM REPORT   Patient Name:   Karina Leonard Date of Exam: 03/13/2023 Medical Rec #:  [id number]        Height:       67.0 in Accession #:    [id number]      Weight:       194.2 lb Date of Birth:  31-Jul-1941       BSA:          1.997 m Patient Age:    82 years        BP:           165/44 mmHg Patient Gender: F               HR:           46 bpm. Exam Location:  [facility] Procedure: 2D Echo, Cardiac Doppler and Color Doppler Indications:     Stroke  History:         Patient has no prior history of Echocardiogram examinations.                  CAD, Stroke; Risk Factors:Hypertension and Diabetes.  Sonographer:     Mikki Harbor Referring Phys:  [id number] Neldon Newport NELSON Diagnosing Phys: Yvonne Kendall MD IMPRESSIONS  1. Left ventricular ejection fraction, by estimation, is >75%. The left ventricle has hyperdynamic function. The left ventricle has no regional wall motion abnormalities. There is severe left ventricular hypertrophy. Left ventricular diastolic parameters are indeterminate.  2. Right ventricular systolic function is moderately reduced. There is relative sparing of the apex that can be seen in the setting of acute cor pulmonale (e.g. acute pulmonary embolism; McConnell's sign). The right ventricular size is mildly enlarged. There is normal pulmonary artery systolic pressure.  3. Left atrial size was moderately dilated.  4. Right atrial size was moderately dilated.  5. The mitral valve is normal in structure. Trivial mitral valve regurgitation.  6. Tricuspid valve regurgitation is moderate.  7. The aortic valve is tricuspid. Aortic valve regurgitation is not visualized. No aortic stenosis is present.  8. The inferior vena cava is normal in size with greater than 50% respiratory variability, suggesting right atrial pressure of 3 mmHg. FINDINGS  Left Ventricle: Left ventricular ejection fraction, by estimation, is >75%. The left ventricle has hyperdynamic function. The left ventricle has no regional wall motion abnormalities. The left ventricular internal cavity size was normal in size. There is severe left  ventricular hypertrophy. Left ventricular diastolic parameters are indeterminate. Right Ventricle: The right ventricular size is mildly enlarged. No increase in right ventricular wall thickness. Right ventricular systolic function is moderately reduced. There is normal pulmonary artery systolic pressure. The tricuspid regurgitant velocity is 1.84 m/s, and with an assumed right atrial pressure of 3 mmHg, the estimated right ventricular systolic pressure is 16.5 mmHg. Left Atrium: Left atrial size was moderately dilated. Right Atrium: Right atrial size was moderately dilated. Pericardium: There is no evidence of pericardial effusion. Mitral Valve: The mitral valve is normal in structure. Mild mitral annular calcification. Trivial mitral valve regurgitation. MV peak gradient, 2.6 mmHg. The mean mitral valve gradient is 0.0 mmHg. Tricuspid Valve: The tricuspid valve is normal in structure. Tricuspid valve regurgitation is moderate. Aortic Valve: The aortic valve is tricuspid. Aortic valve regurgitation is not visualized. No aortic stenosis is present. Aortic valve mean gradient measures 2.0 mmHg. Aortic valve peak gradient measures 5.1 mmHg. Aortic valve area, by VTI measures 2.18 cm. Pulmonic Valve: The pulmonic valve was normal in structure. Pulmonic valve regurgitation is trivial. Aorta: The aortic root is normal in size and structure. Pulmonary Artery: The pulmonary artery is of normal size. Venous: The inferior vena cava is normal in size with greater than 50% respiratory variability, suggesting right atrial pressure of 3 mmHg. IAS/Shunts: No atrial level shunt detected by color flow Doppler.  LEFT VENTRICLE PLAX 2D LVIDd:         4.10 cm LVIDs:         2.00 cm LV PW:         1.90 cm LV IVS:        1.90 cm LVOT diam:     2.00 cm LV SV:         61 LV SV Index:   31 LVOT Area:     3.14 cm  RIGHT VENTRICLE RV Basal diam:  4.05 cm RV Mid diam:    3.00  cm RV S prime:     5.01 cm/s LEFT ATRIUM              Index         RIGHT ATRIUM           Index LA diam:        4.60 cm  2.30 cm/m   RA Area:     27.10 cm LA Vol (A2C):   107.0 ml 53.57 ml/m  RA Volume:   83.70 ml  41.91 ml/m LA Vol (A4C):   103.0 ml 51.57 ml/m LA Biplane Vol: 108.0 ml 54.08 ml/m  AORTIC VALVE                    PULMONIC VALVE AV Area (Vmax):    2.46 cm     PV Vmax:       0.47 m/s AV Area (Vmean):   2.16 cm     PV Peak grad:  0.9 mmHg AV Area (VTI):     2.18 cm AV Vmax:           113.00 cm/s AV Vmean:          68.900 cm/s AV VTI:            0.281 m AV Peak Grad:      5.1 mmHg AV Mean Grad:      2.0 mmHg LVOT Vmax:         88.60 cm/s LVOT Vmean:        47.300 cm/s LVOT VTI:          0.195 m LVOT/AV VTI ratio: 0.69  AORTA Ao Root diam: 3.00 cm MITRAL VALVE               TRICUSPID VALVE MV Area (PHT): 3.17 cm    TR Peak grad:   13.5 mmHg MV Area VTI:   2.63 cm    TR Vmax:        184.00 cm/s MV Peak grad:  2.6 mmHg MV Mean grad:  0.0 mmHg    SHUNTS MV Vmax:       0.81 m/s    Systemic VTI:  0.20 m MV Vmean:      26.9 cm/s   Systemic Diam: 2.00 cm MV Decel Time: 239 msec MV E velocity: 70.30 cm/s Cristal Deer End MD Electronically signed by Yvonne Kendall MD Signature Date/Time: 03/13/2023/1:12:28 PM    Final    MR BRAIN WO CONTRAST  Result Date: 03/12/2023 CLINICAL DATA:  Right facial droop EXAM: MRI HEAD WITHOUT CONTRAST MRA HEAD WITHOUT CONTRAST MRA NECK WITHOUT CONTRAST TECHNIQUE: Multiplanar, multiecho pulse sequences of the brain and surrounding structures were obtained without intravenous contrast. Angiographic images of the Circle of Willis were obtained using MRA technique without intravenous contrast. Angiographic images of the neck were obtained using MRA technique without intravenous contrast. Carotid stenosis measurements (when applicable) are obtained utilizing NASCET criteria, using the distal internal carotid diameter as the denominator. COMPARISON:  01/27/2023 FINDINGS: MRI HEAD FINDINGS Brain: Multiple scattered bilateral small acute  infarcts. The largest lesions are located in the right cerebellum, left temporal lobe and left basal ganglia. Aside from the right cerebellar lesion, all lesions are on the left. Multiple chronic microhemorrhages in a predominantly central distribution. There is confluent hyperintense T2-weighted signal within the white matter. Generalized volume loss. Ex vacuo dilatation of the right lateral ventricle. The midline structures are normal. Vascular: Major flow voids are preserved. Skull and upper cervical spine: Normal calvarium  and skull base. Visualized upper cervical spine and soft tissues are normal. Sinuses/Orbits:No paranasal sinus fluid levels or advanced mucosal thickening. No mastoid or middle ear effusion. Normal orbits. MRA HEAD FINDINGS POSTERIOR CIRCULATION: --Vertebral arteries: Loss of flow related enhancement within the left V4 segment. Normal left PICA. --Inferior cerebellar arteries: Normal. --Basilar artery: Normal. --Superior cerebellar arteries: Normal. --Posterior cerebral arteries: Normal. The right PCA is predominantly supplied by the posterior communicating artery. ANTERIOR CIRCULATION: --Intracranial internal carotid arteries: Atherosclerotic irregularity of the internal carotid arteries at the skull base, left-greater-than-right. --Anterior cerebral arteries (ACA): Normal. --Middle cerebral arteries (MCA): Normal. MRA NECK FINDINGS Motion degraded MRA of the neck. Aortic arch: Unremarkable Right carotid system: There is signal loss of the distal common carotid artery. The right ICA is patent. Left carotid system: There is atherosclerotic irregularity throughout the left carotid system with an area of signal loss near the carotid bifurcation. Vertebral arteries: Right dominant.  No stenosis. IMPRESSION: 1. Multiple scattered bilateral small acute infarcts. The largest lesions are located in the right cerebellum, left temporal lobe and left basal ganglia. No hemorrhage or mass effect. 2. Loss  of flow related enhancement within the left vertebral artery V4 segment, consistent with slow flow or occlusion. 3. Signal loss of the distal right common carotid artery and left internal carotid artery near the carotid bifurcation. This may be exaggerated by motion artifact, but there is likely a component of stenosis or occlusion. CTA of the neck may be helpful for further characterization. 4. 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 Electronically Signed   By: Deatra Robinson M.D.   On: 03/12/2023 21:48   MR ANGIO NECK WO CONTRAST  Result Date: 03/12/2023 CLINICAL DATA:  Right facial droop EXAM: MRI HEAD WITHOUT CONTRAST MRA HEAD WITHOUT CONTRAST MRA NECK WITHOUT CONTRAST TECHNIQUE: Multiplanar, multiecho pulse sequences of the brain and surrounding structures were obtained without intravenous contrast. Angiographic images of the Circle of Willis were obtained using MRA technique without intravenous contrast. Angiographic images of the neck were obtained using MRA technique without intravenous contrast. Carotid stenosis measurements (when applicable) are obtained utilizing NASCET criteria, using the distal internal carotid diameter as the denominator. COMPARISON:  01/27/2023 FINDINGS: MRI HEAD FINDINGS Brain: Multiple scattered bilateral small acute infarcts. The largest lesions are located in the right cerebellum, left temporal lobe and left basal ganglia. Aside from the right cerebellar lesion, all lesions are on the left. Multiple chronic microhemorrhages in a predominantly central distribution. There is confluent hyperintense T2-weighted signal within the white matter. Generalized volume loss. Ex vacuo dilatation of the right lateral ventricle. The midline structures are normal. Vascular: Major flow voids are preserved. Skull and upper cervical spine: Normal calvarium and skull base. Visualized upper cervical spine and  soft tissues are normal. Sinuses/Orbits:No paranasal sinus fluid levels or advanced mucosal thickening. No mastoid or middle ear effusion. Normal orbits. MRA HEAD FINDINGS POSTERIOR CIRCULATION: --Vertebral arteries: Loss of flow related enhancement within the left V4 segment. Normal left PICA. --Inferior cerebellar arteries: Normal. --Basilar artery: Normal. --Superior cerebellar arteries: Normal. --Posterior cerebral arteries: Normal. The right PCA is predominantly supplied by the posterior communicating artery. ANTERIOR CIRCULATION: --Intracranial internal carotid arteries: Atherosclerotic irregularity of the internal carotid arteries at the skull base, left-greater-than-right. --Anterior cerebral arteries (ACA): Normal. --Middle cerebral arteries (MCA): Normal. MRA NECK FINDINGS Motion degraded MRA of the neck. Aortic arch: Unremarkable Right carotid system: There is signal loss of the distal common carotid artery. The right ICA  is patent. Left carotid system: There is atherosclerotic irregularity throughout the left carotid system with an area of signal loss near the carotid bifurcation. Vertebral arteries: Right dominant.  No stenosis. IMPRESSION: 1. Multiple scattered bilateral small acute infarcts. The largest lesions are located in the right cerebellum, left temporal lobe and left basal ganglia. No hemorrhage or mass effect. 2. Loss of flow related enhancement within the left vertebral artery V4 segment, consistent with slow flow or occlusion. 3. Signal loss of the distal right common carotid artery and left internal carotid artery near the carotid bifurcation. This may be exaggerated by motion artifact, but there is likely a component of stenosis or occlusion. CTA of the neck may be helpful for further characterization. 4. 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 Electronically Signed   By: Deatra Robinson M.D.   On:  03/12/2023 21:48   MR ANGIO HEAD WO CONTRAST  Result Date: 03/12/2023 CLINICAL DATA:  Right facial droop EXAM: MRI HEAD WITHOUT CONTRAST MRA HEAD WITHOUT CONTRAST MRA NECK WITHOUT CONTRAST TECHNIQUE: Multiplanar, multiecho pulse sequences of the brain and surrounding structures were obtained without intravenous contrast. Angiographic images of the Circle of Willis were obtained using MRA technique without intravenous contrast. Angiographic images of the neck were obtained using MRA technique without intravenous contrast. Carotid stenosis measurements (when applicable) are obtained utilizing NASCET criteria, using the distal internal carotid diameter as the denominator. COMPARISON:  01/27/2023 FINDINGS: MRI HEAD FINDINGS Brain: Multiple scattered bilateral small acute infarcts. The largest lesions are located in the right cerebellum, left temporal lobe and left basal ganglia. Aside from the right cerebellar lesion, all lesions are on the left. Multiple chronic microhemorrhages in a predominantly central distribution. There is confluent hyperintense T2-weighted signal within the white matter. Generalized volume loss. Ex vacuo dilatation of the right lateral ventricle. The midline structures are normal. Vascular: Major flow voids are preserved. Skull and upper cervical spine: Normal calvarium and skull base. Visualized upper cervical spine and soft tissues are normal. Sinuses/Orbits:No paranasal sinus fluid levels or advanced mucosal thickening. No mastoid or middle ear effusion. Normal orbits. MRA HEAD FINDINGS POSTERIOR CIRCULATION: --Vertebral arteries: Loss of flow related enhancement within the left V4 segment. Normal left PICA. --Inferior cerebellar arteries: Normal. --Basilar artery: Normal. --Superior cerebellar arteries: Normal. --Posterior cerebral arteries: Normal. The right PCA is predominantly supplied by the posterior communicating artery. ANTERIOR CIRCULATION: --Intracranial internal carotid  arteries: Atherosclerotic irregularity of the internal carotid arteries at the skull base, left-greater-than-right. --Anterior cerebral arteries (ACA): Normal. --Middle cerebral arteries (MCA): Normal. MRA NECK FINDINGS Motion degraded MRA of the neck. Aortic arch: Unremarkable Right carotid system: There is signal loss of the distal common carotid artery. The right ICA is patent. Left carotid system: There is atherosclerotic irregularity throughout the left carotid system with an area of signal loss near the carotid bifurcation. Vertebral arteries: Right dominant.  No stenosis. IMPRESSION: 1. Multiple scattered bilateral small acute infarcts. The largest lesions are located in the right cerebellum, left temporal lobe and left basal ganglia. No hemorrhage or mass effect. 2. Loss of flow related enhancement within the left vertebral artery V4 segment, consistent with slow flow or occlusion. 3. Signal loss of the distal right common carotid artery and left internal carotid artery near the carotid bifurcation. This may be exaggerated by motion artifact, but there is likely a component of stenosis or occlusion. CTA of the neck may be helpful for further characterization. 4. Multiple chronic microhemorrhages in a  predominantly central distribution, likely due to chronic hypertensive microangiopathy. Electronically Signed   By: Deatra Robinson M.D.   On: 03/12/2023 21:48   CT HEAD CODE STROKE WO CONTRAST  Result Date: 03/12/2023 CLINICAL DATA:  Code stroke. Neuro deficit, acute, stroke suspected. EXAM: CT HEAD WITHOUT CONTRAST TECHNIQUE: Contiguous axial images were obtained from the base of the skull through the vertex without intravenous contrast. RADIATION DOSE REDUCTION: This exam was performed according to the departmental dose-optimization program which includes automated exposure control, adjustment of the mA and/or kV according to patient size and/or use of iterative reconstruction technique. COMPARISON:  MRI  brain 01/27/2023. FINDINGS: Brain: No acute intracranial hemorrhage. New area of hypoattenuation in the left inferior temporal gyrus. Unchanged encephalomalacia in the right posterior frontal lobe and basal ganglia from prior right MCA territory infarct. Unchanged old infarct in the right occipital lobe and severe chronic small-vessel disease. No hydrocephalus or extra-axial collection. No mass effect or midline shift. Vascular: No hyperdense vessel or unexpected calcification. Skull: No calvarial fracture or suspicious bone lesion. Skull base is unremarkable. Sinuses/Orbits: Unremarkable. Other: None. ASPECTS ([place] Stroke Program Early CT Score) - Ganglionic level infarction (caudate, lentiform nuclei, internal capsule, insula, M1-M3 cortex): 7 - Supraganglionic infarction (M4-M6 cortex): 3 Total score (0-10 with 10 being normal): 10 IMPRESSION: 1. New area of hypoattenuation in the left inferior temporal gyrus, concerning for acute infarct. No acute intracranial hemorrhage. 2. ASPECT score is 10. Code stroke imaging results were communicated on 03/12/2023 at 4:44 pm to provider Dr. Amada Jupiter via secure text paging. Electronically Signed   By: Orvan Falconer M.D.   On: 03/12/2023 16:45   US Venous Img Upper Uni Right(DVT)  Result Date: 03/12/2023 CLINICAL DATA:  Right upper extremity pain and edema. EXAM: RIGHT UPPER EXTREMITY VENOUS DOPPLER ULTRASOUND TECHNIQUE: Gray-scale sonography with graded compression, as well as color Doppler and duplex ultrasound were performed to evaluate the upper extremity deep venous system from the level of the subclavian vein and including the jugular, axillary, basilic, radial, ulnar and upper cephalic vein. Spectral Doppler was utilized to evaluate flow at rest and with distal augmentation maneuvers. COMPARISON:  None Available. FINDINGS: Contralateral Subclavian Vein: Respiratory phasicity is normal and symmetric with the symptomatic side. No evidence of thrombus. Normal  compressibility. Internal Jugular Vein: No evidence of thrombus. Normal compressibility, respiratory phasicity and response to augmentation. Subclavian Vein: No evidence of thrombus. Normal compressibility, respiratory phasicity and response to augmentation. Axillary Vein: No evidence of thrombus. Normal compressibility, respiratory phasicity and response to augmentation. Cephalic Vein: Superficial thrombophlebitis of a segment of the right cephalic vein in the forearm with associated venous distension. Basilic Vein: No evidence of thrombus. Normal compressibility, respiratory phasicity and response to augmentation. Brachial Veins: No evidence of thrombus. Normal compressibility, respiratory phasicity and response to augmentation. Radial Veins: No evidence of thrombus. Normal compressibility, respiratory phasicity and response to augmentation. Ulnar Veins: No evidence of thrombus. Normal compressibility, respiratory phasicity and response to augmentation. Venous Reflux:  None visualized. Other Findings:  No abnormal fluid collections identified. IMPRESSION: 1. No evidence of DVT within the right upper extremity. 2. Superficial thrombophlebitis of a segment of the right cephalic vein in the forearm. Electronically Signed   By: Irish Lack M.D.   On: 03/12/2023 12:24     Medications:    sodium chloride Stopped (03/09/23 1239)   ciprofloxacin Stopped (03/13/23 1236)    amLODipine  10 mg Oral Daily   aspirin EC  81 mg Oral Daily   Chlorhexidine  Gluconate Cloth  6 each Topical Daily   cloNIDine  0.1 mg Oral TID   docusate sodium  100 mg Oral BID   fluticasone  1 spray Each Nare Daily   insulin aspart  0-5 Units Subcutaneous QHS   insulin aspart  0-9 Units Subcutaneous TID WC   loratadine  10 mg Oral Daily   oxyCODONE  5 mg Oral Once   pantoprazole  40 mg Oral Daily   pravastatin  40 mg Oral q1800   venlafaxine XR  75 mg Oral QHS   acetaminophen **OR** acetaminophen, guaiFENesin-dextromethorphan,  hydrALAZINE, loperamide, melatonin, ondansetron, phenol, polyethylene glycol  Assessment/ Plan:  Karina Leonard is a 82 y.o.  female  with hypertension, peripheral vascular disease, carotid artery stenosis, coronary artery disease, atrial fibrillation, diastolic congestive heart failure and TIA, who was admitted to [facility] on 03/08/2023 for Malignant hypertension [I10]   Acute Kidney Injury on chronic kidney disease stage IIIA: baseline creatinine of 1.17, GFR of 47 on 02/27/23. Acute kidney injury secondary to hypertensive emergency. Urinalysis with proteinuria and hematuria from 2023. No IV contrast exposure. No signs of obstruction.  -Creatinine improved today - Acceptable urine output, expect greater output with improvements in renal function. - Continue to avoid nephrotoxic agents and therapies.   Lab Results  Component Value Date   CREATININE 2.54 (H) 03/14/2023   CREATININE 3.46 (H) 03/13/2023   CREATININE 3.37 (H) 03/12/2023    Intake/Output Summary (Last 24 hours) at 03/14/2023 1103 Last data filed at 03/14/2023 0545 Gross per 24 hour  Intake 722.09 ml  Output 630 ml  Net 92.09 ml     Hypertension: with hypertensive emergency on admission. Placed on clevedipine gtt. Secondary work up shows no renal artery stenosis and normal thyroid function panel. Echocardiogram from 01/08/23 reviewed.  - Home regimen of clonidine, furosemide, losartan, hydrochlorothiazide and metoprolol which have all been reordered.  Amlodipine added this admission -Continue holding hydrochlorothiazide, furosemide and losartan  - Plasma renin activity, aldosterone, cortisol, metanephrines and catecholamines pending - Blood pressure elevated this morning 209/53.  Patient received scheduled amlodipine and clonidine. -May consider clonidine patch, will perform medication review.   LOS: 6   5/22/202411:03 AM

## 2023-03-14 NOTE — Progress Notes (Signed)
BPs have steadily risen overnight, now SBPs 180-200/50s.   Webb Silversmith, NP on call notified.  Verbal order to give AM BP meds (clonidine + amlodipine) early.

## 2023-03-14 NOTE — Progress Notes (Signed)
Physical Therapy Treatment Patient Details Name: Karina Leonard MRN: [id number] DOB: [birth year] Today's Date: 03/14/2023   History of Present Illness Pt is an 82 yo female with PMH that includes carotid artery stenosis, A-fib with slow ventricular response, bradycardia, CAD, DDD, depression, diastolic dysfunction, lipoma, HTN, pre-diabetes, TIA, and sciatica who presented for an elective left carotid endarterectomy per vascular surgery on 05/16.  However, procedure canceled due to severe hypertension sbp 200's.  Pt subsequently admitted to the stepdown. MD assessment includes: multiple bilateral small acute infarcts with largest lesions in the R cerebellum, L temporal lobe, and L basal ganglia, malignant hypertension, left carotid artery stenosis, junctional bradycardia, demand Ischemia vs ACS, and AKI on CKD IIIa.    PT Comments    Pt was pleasant and motivated to participate during the session and put forth good effort throughout. Pt required cuing for sequencing with functional tasks and min physical assist with bed mobility and transfers but was steady with standing activities with no overt LOB.  Pt did present with min to mod posterior lean upon coming to sitting at the EOB but improved significantly after anterior weight shifting activities.  Pt was able to amb 30+ feet with a RW this session with slow cadence and with cuing for amb closer to the RW with upright posture but no overt LOB noted.  Pt reported no adverse symptoms during the session with resting HR in the upper 50s increasing to upper 60s with gait, SpO2 WNL on room air throughout.  Pt will benefit from continued PT services upon discharge to safely address deficits listed in patient problem list for decreased caregiver assistance and eventual return to PLOF.     Recommendations for follow up therapy are one component of a multi-disciplinary discharge planning process, led by the attending physician.  Recommendations may be updated  based on patient status, additional functional criteria and insurance authorization.  Follow Up Recommendations       Assistance Recommended at Discharge Frequent or constant Supervision/Assistance  Patient can return home with the following A little help with walking and/or transfers;A little help with bathing/dressing/bathroom;Assistance with cooking/housework;Direct supervision/assist for medications management;Help with stairs or ramp for entrance;Assist for transportation   Equipment Recommendations  Rolling walker (2 wheels);BSC/3in1    Recommendations for Other Services       Precautions / Restrictions Precautions Precautions: Fall Restrictions Weight Bearing Restrictions: No Other Position/Activity Restrictions: Watch HR     Mobility  Bed Mobility Overal bed mobility: Needs Assistance       Supine to sit: Min assist     General bed mobility comments: Cuing for sequencing and min A for BLE control during sup to sit    Transfers Overall transfer level: Needs assistance Equipment used: Rolling walker (2 wheels) Transfers: Sit to/from Stand Sit to Stand: Min assist           General transfer comment: Min verbal cues for hand placement and min A to come to full upright standing    Ambulation/Gait Ambulation/Gait assistance: Min guard Gait Distance (Feet): 30 Feet Assistive device: Rolling walker (2 wheels) Gait Pattern/deviations: Decreased step length - right, Decreased step length - left, Step-through pattern Gait velocity: decreased     General Gait Details: Slow cadence with mod verbal cues for amb closer to the RW with upright posture but steady with no overt LOB   Stairs             Wheelchair Mobility    Modified Rankin (Stroke Patients Only)  Balance Overall balance assessment: Needs assistance   Sitting balance-Leahy Scale: Fair Sitting balance - Comments: initially with posterior lean but improved after anterior weight  shifting activities   Standing balance support: Bilateral upper extremity supported, During functional activity, Reliant on assistive device for balance Standing balance-Leahy Scale: Fair                              Cognition Arousal/Alertness: Awake/alert Behavior During Therapy: WFL for tasks assessed/performed Overall Cognitive Status: Impaired/Different from baseline                                          Exercises Other Exercises Other Exercises: Seated anterior weight shifting activities to address posterior lean in sitting    General Comments        Pertinent Vitals/Pain Pain Assessment Pain Assessment: No/denies pain    Home Living                          Prior Function            PT Goals (current goals can now be found in the care plan section) Progress towards PT goals: Progressing toward goals    Frequency    Min 4X/week      PT Plan Current plan remains appropriate    Co-evaluation              AM-PAC PT "6 Clicks" Mobility   Outcome Measure  Help needed turning from your back to your side while in a flat bed without using bedrails?: A Little Help needed moving from lying on your back to sitting on the side of a flat bed without using bedrails?: A Little Help needed moving to and from a bed to a chair (including a wheelchair)?: A Little Help needed standing up from a chair using your arms (e.g., wheelchair or bedside chair)?: A Little Help needed to walk in hospital room?: A Little Help needed climbing 3-5 steps with a railing? : A Lot 6 Click Score: 17    End of Session Equipment Utilized During Treatment: Gait belt Activity Tolerance: Patient tolerated treatment well Patient left: in chair;with call bell/phone within reach Nurse Communication: Mobility status PT Visit Diagnosis: Muscle weakness (generalized) (M62.81);Difficulty in walking, not elsewhere classified (R26.2)     Time:  1610-9604 PT Time Calculation (min) (ACUTE ONLY): 23 min  Charges:  $Gait Training: 8-22 mins $Therapeutic Activity: 8-22 mins                     D. Scott Tanice Petre PT, DPT 03/14/23, 3:19 PM

## 2023-03-15 DIAGNOSIS — R001 Bradycardia, unspecified: Secondary | ICD-10-CM | POA: Diagnosis not present

## 2023-03-15 DIAGNOSIS — I639 Cerebral infarction, unspecified: Secondary | ICD-10-CM | POA: Diagnosis not present

## 2023-03-15 DIAGNOSIS — I10 Essential (primary) hypertension: Secondary | ICD-10-CM | POA: Diagnosis not present

## 2023-03-15 DIAGNOSIS — I4891 Unspecified atrial fibrillation: Secondary | ICD-10-CM | POA: Diagnosis not present

## 2023-03-15 LAB — PROTEIN ELECTROPHORESIS, SERUM
A/G Ratio: 1 (ref 0.7–1.7)
Albumin ELP: 3 g/dL (ref 2.9–4.4)
Alpha-1-Globulin: 0.3 g/dL (ref 0.0–0.4)
Alpha-2-Globulin: 0.9 g/dL (ref 0.4–1.0)
Beta Globulin: 1.2 g/dL (ref 0.7–1.3)
Gamma Globulin: 0.8 g/dL (ref 0.4–1.8)
Globulin, Total: 3.1 g/dL (ref 2.2–3.9)
Total Protein ELP: 6.1 g/dL (ref 6.0–8.5)

## 2023-03-15 LAB — MAGNESIUM: Magnesium: 2.2 mg/dL (ref 1.7–2.4)

## 2023-03-15 LAB — BASIC METABOLIC PANEL
Anion gap: 11 (ref 5–15)
BUN: 83 mg/dL — ABNORMAL HIGH (ref 8–23)
CO2: 21 mmol/L — ABNORMAL LOW (ref 22–32)
Calcium: 7.9 mg/dL — ABNORMAL LOW (ref 8.9–10.3)
Chloride: 101 mmol/L (ref 98–111)
Creatinine, Ser: 2.92 mg/dL — ABNORMAL HIGH (ref 0.44–1.00)
GFR, Estimated: 16 mL/min — ABNORMAL LOW (ref >60)
Glucose, Bld: 123 mg/dL — ABNORMAL HIGH (ref 70–99)
Potassium: 4.1 mmol/L (ref 3.5–5.1)
Sodium: 133 mmol/L — ABNORMAL LOW (ref 135–145)

## 2023-03-15 LAB — GLUCOSE, CAPILLARY
Glucose-Capillary: 120 mg/dL — ABNORMAL HIGH (ref 70–99)
Glucose-Capillary: 121 mg/dL — ABNORMAL HIGH (ref 70–99)
Glucose-Capillary: 124 mg/dL — ABNORMAL HIGH (ref 70–99)
Glucose-Capillary: 142 mg/dL — ABNORMAL HIGH (ref 70–99)
Glucose-Capillary: 154 mg/dL — ABNORMAL HIGH (ref 70–99)

## 2023-03-15 MED ORDER — SENNA 8.6 MG PO TABS
1.0000 | ORAL_TABLET | Freq: Every day | ORAL | Status: DC
  Administered 2023-03-15 – 2023-03-18 (×4): 8.6 mg via ORAL
  Filled 2023-03-15 (×4): qty 1

## 2023-03-15 MED ORDER — CLONIDINE HCL 0.1 MG PO TABS
0.1000 mg | ORAL_TABLET | Freq: Every day | ORAL | Status: AC
  Administered 2023-03-17 – 2023-03-18 (×2): 0.1 mg via ORAL
  Filled 2023-03-15 (×2): qty 1

## 2023-03-15 MED ORDER — POLYETHYLENE GLYCOL 3350 17 G PO PACK
34.0000 g | PACK | Freq: Every day | ORAL | Status: DC | PRN
  Administered 2023-03-15: 34 g via ORAL
  Filled 2023-03-15: qty 2

## 2023-03-15 NOTE — Progress Notes (Signed)
Physical Therapy Treatment Patient Details Name: Karina Leonard MRN: [id number] DOB: 11-01-1940 Today's Date: 03/15/2023   History of Present Illness Pt is an 82 yo female with PMH that includes carotid artery stenosis, A-fib with slow ventricular response, bradycardia, CAD, DDD, depression, diastolic dysfunction, lipoma, HTN, pre-diabetes, TIA, and sciatica who presented for an elective left carotid endarterectomy per vascular surgery on 05/16.  However, procedure canceled due to severe hypertension sbp 200's.  Pt subsequently admitted to the stepdown. MD assessment includes: multiple bilateral small acute infarcts with largest lesions in the R cerebellum, L temporal lobe, and L basal ganglia, malignant hypertension, left carotid artery stenosis, junctional bradycardia, demand Ischemia vs ACS, and AKI on CKD IIIa.    PT Comments    Patient seen in hand off from OT. Patient ambulatory with RW and min guard but required cues for increasing step length on L as patient tends to drag L foot. Able to complete sit to stand x5 and standing marching after seated rest break from ambulation. Daughter is concerned about fluctuating level of assistance required for patient and requesting rehab prior to discharge home with family assistance. Updated discharge recommendation.    Recommendations for follow up therapy are one component of a multi-disciplinary discharge planning process, led by the attending physician.  Recommendations may be updated based on patient status, additional functional criteria and insurance authorization.  Follow Up Recommendations  Can patient physically be transported by private vehicle: Yes    Assistance Recommended at Discharge Frequent or constant Supervision/Assistance  Patient can return home with the following A little help with walking and/or transfers;A little help with bathing/dressing/bathroom;Assistance with cooking/housework;Direct supervision/assist for medications  management;Help with stairs or ramp for entrance;Assist for transportation   Equipment Recommendations  Rolling Shakeeta Godette (2 wheels);BSC/3in1    Recommendations for Other Services       Precautions / Restrictions Precautions Precautions: Fall Restrictions Weight Bearing Restrictions: No     Mobility  Bed Mobility Overal bed mobility: Needs Assistance Bed Mobility: Sit to Supine       Sit to supine: Min guard   General bed mobility comments: increased time to complete    Transfers Overall transfer level: Needs assistance Equipment used: Rolling Aarsh Fristoe (2 wheels) Transfers: Sit to/from Stand Sit to Stand: Min guard           General transfer comment: cues for hand placement. Min guard for safety. Able to complete sit to stand x 5 at EOB    Ambulation/Gait Ambulation/Gait assistance: Min guard Gait Distance (Feet): 50 Feet Assistive device: Rolling Natalynn Pedone (2 wheels) Gait Pattern/deviations: Step-to pattern, Decreased step length - left, Decreased weight shift to left Gait velocity: decreased     General Gait Details: poor L foot clearance. Cues for increasing step length on L with good follow through. Min guard for safety.   Stairs             Wheelchair Mobility    Modified Rankin (Stroke Patients Only)       Balance Overall balance assessment: Needs assistance Sitting-balance support: No upper extremity supported, Feet supported Sitting balance-Leahy Scale: Fair     Standing balance support: Bilateral upper extremity supported, During functional activity, Reliant on assistive device for balance Standing balance-Leahy Scale: Fair                              Cognition Arousal/Alertness: Awake/alert Behavior During Therapy: WFL for tasks assessed/performed Overall Cognitive Status: Impaired/Different  from baseline Area of Impairment: Memory                     Memory: Decreased short-term memory                   Exercises Other Exercises Other Exercises: sit to stand x 5 with cues for hand placement Other Exercises: standing marching x 10 each LE with B UE support    General Comments        Pertinent Vitals/Pain Pain Assessment Pain Assessment: No/denies pain    Home Living                          Prior Function            PT Goals (current goals can now be found in the care plan section) Acute Rehab PT Goals PT Goal Formulation: With patient/family Time For Goal Achievement: 03/26/23 Potential to Achieve Goals: Good Progress towards PT goals: Progressing toward goals    Frequency    Min 4X/week      PT Plan Discharge plan needs to be updated    Co-evaluation              AM-PAC PT "6 Clicks" Mobility   Outcome Measure  Help needed turning from your back to your side while in a flat bed without using bedrails?: A Little Help needed moving from lying on your back to sitting on the side of a flat bed without using bedrails?: A Little Help needed moving to and from a bed to a chair (including a wheelchair)?: A Little Help needed standing up from a chair using your arms (e.g., wheelchair or bedside chair)?: A Little Help needed to walk in hospital room?: A Little Help needed climbing 3-5 steps with a railing? : A Lot 6 Click Score: 17    End of Session   Activity Tolerance: Patient tolerated treatment well Patient left: in bed;with call bell/phone within reach;with bed alarm set;with family/visitor present Nurse Communication: Mobility status PT Visit Diagnosis: Muscle weakness (generalized) (M62.81);Difficulty in walking, not elsewhere classified (R26.2)     Time: 1610-9604 PT Time Calculation (min) (ACUTE ONLY): 25 min  Charges:  $Therapeutic Exercise: 8-22 mins $Therapeutic Activity: 8-22 mins                     Maylon Peppers, PT, DPT Physical Therapist - [facility]  [place]    Ally Knodel A Adrianna Dudas 03/15/2023,  12:56 PM

## 2023-03-15 NOTE — Progress Notes (Signed)
[facility]  ROUNDING NOTE   Subjective:   Patient seen sitting up in bed, daughter at bedside Patient appears well today, smiling and answering questions appropriately Appetite appropriate, denies nausea or vomiting Denies pain or discomfort Remains on room air with no lower extremity edema  Creatinine 2.92   Objective:  Vital signs in last 24 hours:  Temp:  [97.7 F (36.5 C)-98.4 F (36.9 C)] 97.7 F (36.5 C) (05/23 1201) Pulse Rate:  [51-54] 52 (05/23 1201) Resp:  [16-20] 16 (05/23 1201) BP: (122-183)/(43-102) 169/45 (05/23 1451) SpO2:  [94 %-97 %] 97 % (05/23 1201)  Weight change:  Filed Weights   03/08/23 0626 03/08/23 0841  Weight: 87.5 kg 88.1 kg    Intake/Output: I/O last 3 completed shifts: In: 840.1 [P.O.:640; IV Piggyback:200.1] Out: 800 [Urine:800]   Intake/Output this shift:  Total I/O In: 190 [P.O.:190] Out: 810 [Urine:810]  Physical Exam: General: NAD, sitting up in bed  Head: Normocephalic, atraumatic. Moist oral mucosal membranes  Eyes: Anicteric  Lungs:  Clear on auscultation   Heart: Regular rate and rhythm  Abdomen:  Soft, nontender  Extremities:  No peripheral edema.  Neurologic: Alert and oriented, moving all four extremities  Skin: No lesions  Access: None    Basic Metabolic Panel: Recent Labs  Lab 03/09/23 0406 03/10/23 0453 03/11/23 0417 03/12/23 0507 03/13/23 0409 03/14/23 0519 03/15/23 0654  NA 138   < > 135 134* 132* 133* 133*  K 4.5   < > 3.7 3.7 3.9 4.3 4.1  CL 107   < > 104 104 102 101 101  CO2 23   < > 23 21* 22 23 21*  GLUCOSE 125*   < > 128* 124* 123* 156* 123*  BUN 38*   < > 61* 65* 73* 77* 83*  CREATININE 2.38*   < > 2.64* 3.37* 3.46* 2.54* 2.92*  CALCIUM 8.2*   < > 8.2* 7.8* 7.6* 8.2* 7.9*  MG 1.6*  --  2.2 2.1 2.1 2.2 2.2  PHOS 3.2  --   --  4.6 5.6* 4.4  --    < > = values in this interval not displayed.     Liver Function Tests: Recent Labs  Lab 03/09/23 0406 03/12/23 0507  AST 82*   --   ALT 37  --   ALKPHOS 84  --   BILITOT 0.9  --   PROT 6.5  --   ALBUMIN 3.4* 2.7*    No results for input(s): "LIPASE", "AMYLASE" in the last 168 hours. No results for input(s): "AMMONIA" in the last 168 hours.  CBC: Recent Labs  Lab 03/09/23 0406 03/11/23 0417 03/12/23 0855 03/13/23 0409 03/14/23 0519  WBC 20.6* 17.7* 12.8* 12.3* 20.3*  NEUTROABS  --   --   --   --  18.1*  HGB 11.9* 11.5* 11.2* 9.8* 11.0*  HCT 35.5* 34.3* 34.5* 30.2* 32.9*  MCV 92.2 92.2 93.8 94.7 92.7  PLT 204 190 187 173 217     Cardiac Enzymes: No results for input(s): "CKTOTAL", "CKMB", "CKMBINDEX", "TROPONINI" in the last 168 hours.  BNP: Invalid input(s): "POCBNP"  CBG: Recent Labs  Lab 03/14/23 2112 03/15/23 0030 03/15/23 0805 03/15/23 1204 03/15/23 1600  GLUCAP 140* 121* 120* 124* 142*     Microbiology: Results for orders placed or performed during the hospital encounter of 03/08/23  Urine Culture (for pregnant, neutropenic or urologic patients or patients with an indwelling urinary catheter)     Status: Abnormal   Collection Time:  03/12/23  8:11 AM   Specimen: Urine, Random  Result Value Ref Range Status   Specimen Description   Final    URINE, RANDOM Performed at [facility], [address] [place], [place] [address]    Special Requests   Final    NONE Performed at [facility], [address] [place], [place] [address]    Culture (A)  Final    <10,000 COLONIES/mL INSIGNIFICANT GROWTH Performed at [facility], [address], [place], [place] [address]    Report Status 03/13/2023 FINAL  Final    Coagulation Studies: No results for input(s): "LABPROT", "INR" in the last 72 hours.  Urinalysis: No results for input(s): "COLORURINE", "LABSPEC", "PHURINE", "GLUCOSEU", "HGBUR", "BILIRUBINUR", "KETONESUR", "PROTEINUR", "UROBILINOGEN", "NITRITE", "LEUKOCYTESUR" in the last 72 hours.  Invalid input(s): "APPERANCEUR"     Imaging: No  results found.   Medications:    sodium chloride Stopped (03/09/23 1239)    amLODipine  10 mg Oral Daily   aspirin EC  81 mg Oral Daily   Chlorhexidine Gluconate Cloth  6 each Topical Daily   cloNIDine  0.1 mg Transdermal Weekly   cloNIDine  0.1 mg Oral BID   [START ON 03/17/2023] cloNIDine  0.1 mg Oral Daily   docusate sodium  100 mg Oral BID   fluticasone  1 spray Each Nare Daily   heparin  5,000 Units Subcutaneous Q8H   insulin aspart  0-5 Units Subcutaneous QHS   insulin aspart  0-9 Units Subcutaneous TID WC   loratadine  10 mg Oral Daily   pantoprazole  40 mg Oral Daily   pravastatin  40 mg Oral q1800   senna  1 tablet Oral Daily   venlafaxine XR  75 mg Oral QHS   acetaminophen **OR** acetaminophen, guaiFENesin-dextromethorphan, hydrALAZINE, melatonin, ondansetron, phenol, polyethylene glycol  Assessment/ Plan:  [name] is a 82 y.o.  female  with hypertension, peripheral vascular disease, carotid artery stenosis, coronary artery disease, atrial fibrillation, diastolic congestive heart failure and TIA, who was admitted to [facility] on 03/08/2023 for Malignant hypertension [I10]   Acute Kidney Injury on chronic kidney disease stage IIIA: baseline creatinine of 1.17, GFR of 47 on 02/27/23. Acute kidney injury secondary to hypertensive emergency. Urinalysis with proteinuria and hematuria from 2023. No IV contrast exposure. No signs of obstruction.  -Creatinine slightly elevated today, not unexpected with increased blood pressure. - Discussed with patient and family that creatinine will stabilize with blood pressure. - Will schedule follow-up appointment in our office at discharge to monitor. - No acute indication for dialysis.   Lab Results  Component Value Date   CREATININE 2.92 (H) 03/15/2023   CREATININE 2.54 (H) 03/14/2023   CREATININE 3.46 (H) 03/13/2023    Intake/Output Summary (Last 24 hours) at 03/15/2023 1626 Last data filed at 03/15/2023 1357 Gross per 24  hour  Intake 350 ml  Output 1085 ml  Net -735 ml     Hypertension: with hypertensive emergency on admission. Placed on clevedipine gtt. Secondary work up shows no renal artery stenosis and normal thyroid function panel. Echocardiogram from 01/08/23 reviewed.  - Home regimen of clonidine, furosemide, losartan, hydrochlorothiazide and metoprolol which have all been reordered.  Amlodipine added this admission -Continue holding hydrochlorothiazide, furosemide and losartan  - Plasma renin activity, aldosterone, cortisol, metanephrines and catecholamines pending -Improved control of blood pressure overnight with addition of clonidine patch - Agree with tapering oral clonidine.   LOS: 7   5/23/20244:26 PM

## 2023-03-15 NOTE — [place] FL2 (Signed)
[place] MEDICAID FL2 LEVEL OF CARE FORM     IDENTIFICATION  Patient Name: Karina Leonard Birthdate: 1941-03-25 Sex: female Admission Date (Current Location): 03/08/2023  [facility] [place] [place] Number:  Chiropodist and Address:  [facility], [address] [place], [place] [address]      Provider Number: [id number]  Attending Physician Name and Address:  Kathrynn Running, MD  Relative Name and Phone Number:  Caprice Beaver [phone]    Current Level of Care: Hospital Recommended Level of Care: Skilled Nursing Facility Prior Approval Number:    Date Approved/Denied:   PASRR Number: Pending  Discharge Plan: SNF    Current Diagnoses: Patient Active Problem List   Diagnosis Date Noted   Acute CVA (cerebrovascular accident) (HCC) 03/14/2023   AKI (acute kidney injury) (HCC) 03/14/2023   Malignant hypertension 03/08/2023   Carotid stenosis 02/09/2023   Diabetes mellitus without complication (HCC) 02/09/2023   Urinary incontinence 07/19/2021   Need for vaccination 03/18/2019   H/O hypercholesterolemia 03/18/2019   Drug-induced obesity 03/18/2019   Coronary artery disease 03/18/2019   Cataract cortical, senile 03/18/2019   Taking medication for chronic disease 03/18/2019   Numbness and tingling of right upper extremity 08/15/2016   Lipoma of right upper extremity 08/15/2016   Cerebral microvascular disease 07/23/2015   Familial multiple lipoprotein-type hyperlipidemia 02/16/2015   Anxiety attack 02/16/2015   Recurrent major depressive episodes (HCC) 02/16/2015   Essential (primary) hypertension 02/16/2015   H/O: HTN (hypertension) 02/16/2015   Asymptomatic hypertensive urgency 11/14/2014   Tubulovillous adenoma of rectum 10/21/2014   Depression 10/21/2014   Nuclear sclerosis of both eyes 07/07/2014    Orientation RESPIRATION BLADDER Height & Weight     Self, Time, Situation, Place  Normal Continent Weight: 88.1 kg Height:   5\' 7"  (170.2 cm)  BEHAVIORAL SYMPTOMS/MOOD NEUROLOGICAL BOWEL NUTRITION STATUS      Continent  (See Discharge Summary)  AMBULATORY STATUS COMMUNICATION OF NEEDS Skin   Extensive Assist Verbally Normal                       Personal Care Assistance Level of Assistance  Total care, Bathing, Dressing, Feeding Bathing Assistance: Maximum assistance Feeding assistance: Limited assistance Dressing Assistance: Maximum assistance     Functional Limitations Info  Sight, Hearing, Speech Sight Info: Adequate Hearing Info: Adequate Speech Info: Adequate    SPECIAL CARE FACTORS FREQUENCY  PT (By licensed PT), OT (By licensed OT)     PT Frequency: 5x weekly OT Frequency: 5x weekly            Contractures Contractures Info: Not present    Additional Factors Info  Code Status, Allergies Code Status Info: Full Code Allergies Info: Amoxicillin, Donepezil, Nicardipine Hcl In Dextrose, Penicillins           Current Medications (03/15/2023):  This is the current hospital active medication list Current Facility-Administered Medications  Medication Dose Route Frequency Provider Last Rate Last Admin   0.9 %  sodium chloride infusion  250 mL Intravenous Continuous Ezequiel Essex, NP   Stopped at 03/09/23 1239   acetaminophen (TYLENOL) tablet 325-650 mg  325-650 mg Oral Q4H PRN Annice Needy, MD   650 mg at 03/14/23 2229   Or   acetaminophen (TYLENOL) suppository 325-650 mg  325-650 mg Rectal Q4H PRN Annice Needy, MD       amLODipine (NORVASC) tablet 10 mg  10 mg Oral Daily Wouk, Wilfred Curtis, MD  10 mg at 03/15/23 6213   aspirin EC tablet 81 mg  81 mg Oral Daily Annice Needy, MD   81 mg at 03/15/23 0865   Chlorhexidine Gluconate Cloth 2 % PADS 6 each  6 each Topical Daily Annice Needy, MD   6 each at 03/15/23 7846   cloNIDine (CATAPRES - Dosed in mg/24 hr) patch 0.1 mg  0.1 mg Transdermal Weekly Mosetta Pigeon, MD   0.1 mg at 03/14/23 1820   cloNIDine (CATAPRES) tablet 0.1 mg  0.1 mg  Oral BID Rebeca Allegra, PA-C   0.1 mg at 03/15/23 0936   [START ON 03/17/2023] cloNIDine (CATAPRES) tablet 0.1 mg  0.1 mg Oral Daily Tang, Cheryln Manly, PA-C       docusate sodium (COLACE) capsule 100 mg  100 mg Oral BID Annice Needy, MD   100 mg at 03/15/23 0937   fluticasone (FLONASE) 50 MCG/ACT nasal spray 1 spray  1 spray Each Nare Daily Annice Needy, MD   1 spray at 03/15/23 0937   guaiFENesin-dextromethorphan (ROBITUSSIN DM) 100-10 MG/5ML syrup 15 mL  15 mL Oral Q4H PRN Annice Needy, MD       heparin injection 5,000 Units  5,000 Units Subcutaneous Q8H Kathrynn Running, MD   5,000 Units at 03/15/23 1446   hydrALAZINE (APRESOLINE) injection 20 mg  20 mg Intravenous Q6H PRN Kathrynn Running, MD   20 mg at 03/15/23 2014   insulin aspart (novoLOG) injection 0-5 Units  0-5 Units Subcutaneous QHS Ezequiel Essex, NP       insulin aspart (novoLOG) injection 0-9 Units  0-9 Units Subcutaneous TID WC Ezequiel Essex, NP   1 Units at 03/15/23 1627   loratadine (CLARITIN) tablet 10 mg  10 mg Oral Daily Annice Needy, MD   10 mg at 03/15/23 0936   melatonin tablet 5 mg  5 mg Oral QHS PRN Rust-Chester, Micheline Rough L, NP   5 mg at 03/14/23 2230   ondansetron (ZOFRAN) injection 4 mg  4 mg Intravenous Q6H PRN Annice Needy, MD       pantoprazole (PROTONIX) EC tablet 40 mg  40 mg Oral Daily Annice Needy, MD   40 mg at 03/15/23 0936   phenol (CHLORASEPTIC) mouth spray 1 spray  1 spray Mouth/Throat PRN Annice Needy, MD       polyethylene glycol (MIRALAX / GLYCOLAX) packet 34 g  34 g Oral Daily PRN Kathrynn Running, MD   34 g at 03/15/23 1301   pravastatin (PRAVACHOL) tablet 40 mg  40 mg Oral q1800 Annice Needy, MD   40 mg at 03/15/23 1738   senna (SENOKOT) tablet 8.6 mg  1 tablet Oral Daily Kathrynn Running, MD   8.6 mg at 03/15/23 1309   venlafaxine XR (EFFEXOR-XR) 24 hr capsule 75 mg  75 mg Oral QHS Annice Needy, MD   75 mg at 03/14/23 2230     Discharge Medications: Please see discharge summary for a  list of discharge medications.  Relevant Imaging Results:  Relevant Lab Results:   Additional Information [id number]  Garret Reddish, RN

## 2023-03-15 NOTE — Progress Notes (Signed)
Occupational Therapy Treatment Patient Details Name: Karina Leonard MRN: [id number] DOB: 11-28-1940 Today's Date: 03/15/2023   History of present illness Pt is an 82 yo female with PMH that includes carotid artery stenosis, A-fib with slow ventricular response, bradycardia, CAD, DDD, depression, diastolic dysfunction, lipoma, HTN, pre-diabetes, TIA, and sciatica who presented for an elective left carotid endarterectomy per vascular surgery on 05/16.  However, procedure canceled due to severe hypertension sbp 200's.  Pt subsequently admitted to the stepdown. MD assessment includes: multiple bilateral small acute infarcts with largest lesions in the R cerebellum, L temporal lobe, and L basal ganglia, malignant hypertension, left carotid artery stenosis, junctional bradycardia, demand Ischemia vs ACS, and AKI on CKD IIIa.   OT comments  Upon entering the room, pt supine in bed with daughter, Duwayne Heck, present in room. Pt is agreeable to OT intervention and reports having slept well last night. Danielle with several questions in regards to discharge planning and possible concern for family not being able to provide 24/7 supervision and assistance at discharge. She is also concerns about pt fluctuating throughout the day with level of assistance she may need. Therefore, members of the team notified and recommendation changed for pt safety. Pt performed bed mobility with min A to EOB. Pt stands with min A and ambulates with RW and min guard into bathroom for toileting needs. Pt able to perform hygiene while seated with min guard for balance. Min A to stand from toilet with use of RW . Pt then transitions to to PT session.     Recommendations for follow up therapy are one component of a multi-disciplinary discharge planning process, led by the attending physician.  Recommendations may be updated based on patient status, additional functional criteria and insurance authorization.    Assistance Recommended at  Discharge Frequent or constant Supervision/Assistance  Patient can return home with the following  A little help with walking and/or transfers;A little help with bathing/dressing/bathroom;Assistance with cooking/housework;Assist for transportation;Help with stairs or ramp for entrance;Direct supervision/assist for financial management;Direct supervision/assist for medications management   Equipment Recommendations  None recommended by OT       Precautions / Restrictions Precautions Precautions: Fall Restrictions Weight Bearing Restrictions: No       Mobility Bed Mobility Overal bed mobility: Needs Assistance Bed Mobility: Supine to Sit     Supine to sit: Min assist          Transfers Overall transfer level: Needs assistance Equipment used: Rolling walker (2 wheels) Transfers: Sit to/from Stand Sit to Stand: Min guard, Min assist                 Balance Overall balance assessment: Needs assistance Sitting-balance support: No upper extremity supported, Feet supported Sitting balance-Leahy Scale: Fair Sitting balance - Comments: initially with posterior lean but improved after anterior weight shifting activities Postural control: Right lateral lean, Posterior lean Standing balance support: Bilateral upper extremity supported, During functional activity, Reliant on assistive device for balance Standing balance-Leahy Scale: Fair                             ADL either performed or assessed with clinical judgement   ADL Overall ADL's : Needs assistance/impaired                                       General ADL Comments: Pt needing  assistance for balance to don socks in figure four position while seated on EOB. Pt ambulates to bathroom for toileting needs with min A for balance while pt performs hygiene.      Cognition Arousal/Alertness: Awake/alert Behavior During Therapy: WFL for tasks assessed/performed Overall Cognitive Status:  Impaired/Different from baseline Area of Impairment: Memory                     Memory: Decreased short-term memory                             Pertinent Vitals/ Pain       Pain Assessment Pain Assessment: No/denies pain         Frequency  Min 3X/week        Progress Toward Goals  OT Goals(current goals can now be found in the care plan section)  Progress towards OT goals: Progressing toward goals     Plan Discharge plan needs to be updated;Frequency remains appropriate       AM-PAC OT "6 Clicks" Daily Activity     Outcome Measure   Help from another person eating meals?: None Help from another person taking care of personal grooming?: A Little Help from another person toileting, which includes using toliet, bedpan, or urinal?: A Little Help from another person bathing (including washing, rinsing, drying)?: A Lot Help from another person to put on and taking off regular upper body clothing?: A Little Help from another person to put on and taking off regular lower body clothing?: A Lot 6 Click Score: 17    End of Session Equipment Utilized During Treatment: Rolling walker (2 wheels)  OT Visit Diagnosis: Unsteadiness on feet (R26.81);Repeated falls (R29.6);Muscle weakness (generalized) (M62.81)   Activity Tolerance Patient tolerated treatment well   Patient Left Other (comment) (transitioned to PT session)   Nurse Communication Mobility status        Time: 1610-9604 OT Time Calculation (min): 20 min  Charges: OT General Charges $OT Visit: 1 Visit OT Treatments $Self Care/Home Management : 8-22 mins  Jackquline Denmark, MS, OTR/L , CBIS ascom [phone]  03/15/23, 1:02 PM

## 2023-03-15 NOTE — TOC Progression Note (Signed)
Transition of Care [facility]) - Progression Note    Patient Details  Name: Karina Leonard MRN: [id number] Date of Birth: 19-Oct-1941  [facility]) CM/SW Contact  Garret Reddish, RN Phone Number: 03/15/2023, 9:21 PM  Clinical Narrative:   I have meet with patient and her daughter Duwayne Heck at bedside.  Patient reports that prior to admission she lived by herself.  She reports that she was able to get around her home with a cane.  She reports that she was able to bath and dress herself.  She also reports that she was driving.  Mrs. Faughnan drove herself to appointments.  Patient has no other DME at home.  Patient reports that medications are affordable.  Patient uses [facility] for prescription medications.  Patient's PCP is Elizabeth Sauer.   I have spoken with patient and Duwayne Heck about SNF placement.  I have explained the SNF process.  Patient has given me permission to complete bed search for [place] area facilities.    I have submitted for a PASSUR.  PASSUR is pending at this time.  I have completed FL 2 and completed Bed Search to [facility] area facilities.    TOC will continue to follow for discharge planning.      Expected Discharge Plan: Home w Home Health Services Barriers to Discharge: Continued Medical Work up  Expected Discharge Plan and Services       Living arrangements for the past 2 months: Single Family Home                                       Social Determinants of Health (SDOH) Interventions SDOH Screenings   Food Insecurity: No Food Insecurity (03/08/2023)  Housing: Low Risk  (03/08/2023)  Transportation Needs: No Transportation Needs (03/08/2023)  Utilities: Not At Risk (03/08/2023)  Alcohol Screen: Low Risk  (03/22/2022)  Depression (PHQ2-9): Low Risk  (02/02/2023)  Financial Resource Strain: Low Risk  (03/22/2022)  Physical Activity: Insufficiently Active (03/22/2022)  Social Connections: Moderately Isolated (03/22/2022)  Stress: No Stress  Concern Present (03/22/2022)  Tobacco Use: Medium Risk (03/08/2023)    Readmission Risk Interventions     No data to display

## 2023-03-15 NOTE — Progress Notes (Signed)
RE: Karina Leonard Date of Birth: [birth year] Date: 03/15/2023     To Whom It May Concern:   Please be advised that the above-named patient will require a short-term nursing home stay - anticipated 30 days or less for rehabilitation and strengthening.  The plan is for return home

## 2023-03-15 NOTE — Progress Notes (Signed)
[facility]IN[facility] CARDIOLOGY CONSULT NOTE       Patient ID: [name] MRN: [id number] DOB/AGE: 1941/07/02 82 y.o.  Admit date: 03/08/2023 Referring Physician Zada Girt, NP  Primary Physician Dr. Elizabeth Sauer Primary Cardiologist Dr. Juliann Pares Reason for Consultation labile BP, junctional bradycardia   HPI: Karina Leonard is an 25yoF with a PMH of HTN, bilateral carotid stenosis (70% RICA, 80% LICA), hx CVA/TIA, HFpEF (55%), abnormal lexiscan myoview (01/08/23), sinus arrhythmia who presented to [facility] for elective left carotid endarterectomy with vascular surgery. The patient's BP was extremely elevated and procedure was cancelled and was admitted for BP control.  Cardiology is consulted for further assistance.  Interval History:  -Feels good today, slept well last night -No further positional chest or upper back discomfort, no shortness of breath.  Right-sided facial droop has resolved, right hand weakness improving. -Remains in sinus bradycardia to NSR on telemetry with rate 49-60 bpm.  No evidence of AF or other arrhythmias  Review of systems complete and found to be negative unless listed above    Past Medical History:  Diagnosis Date   Allergy    Aortic atherosclerosis (HCC)    Atrial fibrillation with slow ventricular response (HCC) 02/27/2023   a.) noted on preoperative ECG 02/27/2023; A.fib at 52 bpm with LVH and (+) inferolateral TWIs; b.) CHA2DS2VASc = 7 (age x 2, sex, HTN, CVA/TIA x2, vascular disease history); c.) rate/rhythm maintained on oral metoprolol; chronically antithrombotic therapy (ASA + clopidogrel)   Bilateral carotid artery disease (HCC)    a.) doppler 01/25/2023: 60% RICA, 70% LICA   Bradycardia    Cataract cortical, senile    Cerebrovascular disease    Coronary artery disease involving native coronary artery of native heart without angina pectoris    a.) MPI 05/24/2020: EF 45-50%. Borderline ant/lat defect with border zone redistribution; b.)  MPI 01/08/2023: EF 45%; mild-mod mixed ant apical defect c/w infarct vs. ischemia   DDD (degenerative disc disease), lumbar    Depression    Diastolic dysfunction    a.) TTE 01/08/2023: EF >55%, mod LVH, sev LAE, mild MR/TR/PR, G1DD   Full dentures    Hyperlipidemia    Hypertension    Keloid    Lipoma    Long term current use of antithrombotics/antiplatelets    a.) on DAPT (ASA + clopidogrel)   Mild cognitive impairment    Pre-diabetes    Sciatica    Stroke [facility])    a.) MRI brain 03/19/2020: multiple old small vessel infarcts of the basal ganglia, thalamus and pons   TIA (transient ischemic attack)    Urinary incontinence     Past Surgical History:  Procedure Laterality Date   CHOLECYSTECTOMY  2009   COLONOSCOPY     RECTAL POLYPECTOMY     benign    Medications Prior to Admission  Medication Sig Dispense Refill Last Dose   aspirin EC 81 MG tablet Take 81 mg by mouth daily. Take while off of the plavix for surgey   03/07/2023   cloNIDine (CATAPRES - DOSED IN MG/24 HR) 0.2 mg/24hr patch Place 0.2 mg onto the skin once a week. Sunday   Past Week   fexofenadine (ALLEGRA) 180 MG tablet Take 1 tablet (180 mg total) by mouth daily. otc 90 tablet 1 Past Week   fluticasone (FLONASE) 50 MCG/ACT nasal spray Place 1 spray into both nostrils daily. otc   Past Week   furosemide (LASIX) 20 MG tablet Take 20 mg by mouth daily. callwood  Past Week   losartan-hydrochlorothiazide (HYZAAR) 100-25 MG tablet Take 1 tablet by mouth daily. 90 tablet 1 03/07/2023   lovastatin (MEVACOR) 20 MG tablet TAKE 2 TABLETS EVERY DAY (Patient taking differently: Take 2 tablets by mouth at bedtime. TAKE 2 TABLETS EVERY DAY) 180 tablet 1 03/07/2023   metoprolol succinate (TOPROL-XL) 50 MG 24 hr tablet Take 1 tablet (50 mg total) by mouth daily. [place] 90 tablet 0 03/08/2023 at 0500   venlafaxine XR (EFFEXOR-XR) 75 MG 24 hr capsule Take 1 capsule (75 mg total) by mouth daily. (Patient taking differently: Take 75 mg by  mouth at bedtime.) 90 capsule 1 03/07/2023   clopidogrel (PLAVIX) 75 MG tablet Take 1 tablet (75 mg total) by mouth daily. 90 tablet 1 03/01/2023   Social History   Socioeconomic History   Marital status: Widowed    Spouse name: Not on file   Number of children: 3   Years of education: some college   Highest education level: 12th grade  Occupational History   Occupation: Retired  Tobacco Use   Smoking status: Former    Packs/day: 1.00    Years: 20.00    Additional pack years: 0.00    Total pack years: 20.00    Types: Cigarettes    Quit date: 54    Years since quitting: 57.4   Smokeless tobacco: Never  Vaping Use   Vaping Use: Never used  Substance and Sexual Activity   Alcohol use: Yes    Alcohol/week: 0.0 standard drinks of alcohol    Comment: special occasions   Drug use: No   Sexual activity: Never  Other Topics Concern   Not on file  Social History Narrative   Lives alone   Social Determinants of Health   Financial Resource Strain: Low Risk  (03/22/2022)   Overall Financial Resource Strain (CARDIA)    Difficulty of Paying Living Expenses: Not hard at all  Food Insecurity: No Food Insecurity (03/08/2023)   Hunger Vital Sign    Worried About Running Out of Food in the Last Year: Never true    Ran Out of Food in the Last Year: Never true  Transportation Needs: No Transportation Needs (03/08/2023)   PRAPARE - Administrator, Civil Service (Medical): No    Lack of Transportation (Non-Medical): No  Physical Activity: Insufficiently Active (03/22/2022)   Exercise Vital Sign    Days of Exercise per Week: 3 days    Minutes of Exercise per Session: 30 min  Stress: No Stress Concern Present (03/22/2022)   [employer] of Occupational Health - Occupational Stress Questionnaire    Feeling of Stress : Not at all  Social Connections: Moderately Isolated (03/22/2022)   Social Connection and Isolation Panel [NHANES]    Frequency of Communication with Friends and  Family: More than three times a week    Frequency of Social Gatherings with Friends and Family: Twice a week    Attends Religious Services: More than 4 times per year    Active Member of [employer] or Organizations: No    Attends Banker Meetings: Never    Marital Status: Widowed  Intimate Partner Violence: Not At Risk (03/08/2023)   Humiliation, Afraid, Rape, and Kick questionnaire    Fear of Current or Ex-Partner: No    Emotionally Abused: No    Physically Abused: No    Sexually Abused: No    Family History  Problem Relation Age of Onset   Cancer Mother  uterine or ovarian. Unable to remember   Cancer Father        lung   Heart attack Sister       Intake/Output Summary (Last 24 hours) at 03/15/2023 1478 Last data filed at 03/15/2023 0700 Gross per 24 hour  Intake 360.07 ml  Output 275 ml  Net 85.07 ml     Vitals:   03/14/23 2114 03/15/23 0033 03/15/23 0034 03/15/23 0420  BP: (!) 156/45 (!) 122/102 (!) 156/43 (!) 145/49  Pulse: (!) 53 (!) 52 (!) 51   Resp: 18 18  18   Temp: 98 F (36.7 C) 98.1 F (36.7 C)  98.4 F (36.9 C)  TempSrc: Oral Oral    SpO2: 95% 95%  94%  Weight:      Height:        PHYSICAL EXAM General: Pleasant elderly black female, well nourished, in no acute distress.  Sitting upright in hospital bed on the first floor, daughter Duwayne Heck at bedside.   HEENT:  Normocephalic and atraumatic. Neck:  No JVD.  Lungs: Normal respiratory effort on room air. Clear bilaterally to auscultation. No wheezes, crackles, rhonchi.  Heart: Bradycardic but regular. Normal S1 and S2 without gallops or murmurs.  Abdomen: Non-distended appearing.  Msk: Normal strength and tone for age.   Extremities: Warm and well perfused. No clubbing, cyanosis.  No peripheral edema.  Neuro: Alert and oriented X 3. No residual facial drooping, weakness.  Psych:  Answers questions appropriately.   Labs: Basic Metabolic Panel: Recent Labs    03/13/23 0409  03/14/23 0519 03/15/23 0654  NA 132* 133* 133*  K 3.9 4.3 4.1  CL 102 101 101  CO2 22 23 21*  GLUCOSE 123* 156* 123*  BUN 73* 77* 83*  CREATININE 3.46* 2.54* 2.92*  CALCIUM 7.6* 8.2* 7.9*  MG 2.1 2.2 2.2  PHOS 5.6* 4.4  --     Liver Function Tests: No results for input(s): "AST", "ALT", "ALKPHOS", "BILITOT", "PROT", "ALBUMIN" in the last 72 hours.   No results for input(s): "LIPASE", "AMYLASE" in the last 72 hours. CBC: Recent Labs    03/13/23 0409 03/14/23 0519  WBC 12.3* 20.3*  NEUTROABS  --  18.1*  HGB 9.8* 11.0*  HCT 30.2* 32.9*  MCV 94.7 92.7  PLT 173 217    Cardiac Enzymes: Recent Labs    03/12/23 1102 03/13/23 0409  TROPONINIHS 2,667* 1,734*    BNP: No results for input(s): "BNP" in the last 72 hours.  D-Dimer: No results for input(s): "DDIMER" in the last 72 hours. Hemoglobin A1C: No results for input(s): "HGBA1C" in the last 72 hours.  Fasting Lipid Panel: Recent Labs    03/13/23 0409  CHOL 113  HDL 39*  LDLCALC 59  TRIG 74  CHOLHDL 2.9    Thyroid Function Tests: No results for input(s): "TSH", "T4TOTAL", "T3FREE", "THYROIDAB" in the last 72 hours.  Invalid input(s): "FREET3"  Anemia Panel: No results for input(s): "VITAMINB12", "FOLATE", "FERRITIN", "TIBC", "IRON", "RETICCTPCT" in the last 72 hours.   Radiology: US Carotid Bilateral  Result Date: 03/13/2023 CLINICAL DATA:  Carotid stenosis EXAM: BILATERAL CAROTID DUPLEX ULTRASOUND TECHNIQUE: Wallace Cullens scale imaging, color Doppler and duplex ultrasound were performed of bilateral carotid and vertebral arteries in the neck. COMPARISON:  None Available. FINDINGS: Criteria: Quantification of carotid stenosis is based on velocity parameters that correlate the residual internal carotid diameter with NASCET-based stenosis levels, using the diameter of the distal internal carotid lumen as the denominator for stenosis measurement. The following velocity measurements  were obtained: RIGHT ICA: 116/16  cm/sec CCA: 121/19 cm/sec SYSTOLIC ICA/CCA RATIO:  1.0 ECA:  50 cm/sec LEFT ICA: 95/23 cm/sec CCA: 218/27 cm/sec SYSTOLIC ICA/CCA RATIO:  0.4 ECA:  105 cm/sec RIGHT CAROTID ARTERY: Extensive calcified atherosclerotic plaque throughout the visualized carotid artery. By peak systolic velocity criteria, estimated stenosis is less than 50%. RIGHT VERTEBRAL ARTERY:  Patent with normal antegrade flow. LEFT CAROTID ARTERY: Calcified atherosclerotic plaque throughout the visualized carotid system. By peak systolic velocity criteria, the estimated stenosis is less than 50%. LEFT VERTEBRAL ARTERY:  Patent with normal antegrade flow. IMPRESSION: 1. Mild (1-49%) stenosis proximal right internal carotid artery secondary to extensive echogenic/calcified atherosclerotic plaque. 2. Mild (1-49%) stenosis proximal left internal carotid artery secondary to extensive echogenic/calcified atherosclerotic plaque. 3. Vertebral arteries are patent with normal antegrade flow. Electronically Signed   By: Malachy Moan M.D.   On: 03/13/2023 15:15   ECHOCARDIOGRAM COMPLETE  Result Date: 03/13/2023    ECHOCARDIOGRAM REPORT   Patient Name:   ANALIZ ESSARY Date of Exam: 03/13/2023 Medical Rec #:  [id number]       Height:       67.0 in Accession #:    [id number]      Weight:       194.2 lb Date of Birth:  08-22-1941       BSA:          1.997 m Patient Age:    82 years        BP:           165/44 mmHg Patient Gender: F               HR:           46 bpm. Exam Location:  [facility] Procedure: 2D Echo, Cardiac Doppler and Color Doppler Indications:     Stroke  History:         Patient has no prior history of Echocardiogram examinations.                  CAD, Stroke; Risk Factors:Hypertension and Diabetes.  Sonographer:     Mikki Harbor Referring Phys:  [id number] Neldon Newport NELSON Diagnosing Phys: Yvonne Kendall MD IMPRESSIONS  1. Left ventricular ejection fraction, by estimation, is >75%. The left ventricle has hyperdynamic function. The left  ventricle has no regional wall motion abnormalities. There is severe left ventricular hypertrophy. Left ventricular diastolic parameters are indeterminate.  2. Right ventricular systolic function is moderately reduced. There is relative sparing of the apex that can be seen in the setting of acute cor pulmonale (e.g. acute pulmonary embolism; McConnell's sign). The right ventricular size is mildly enlarged. There is normal pulmonary artery systolic pressure.  3. Left atrial size was moderately dilated.  4. Right atrial size was moderately dilated.  5. The mitral valve is normal in structure. Trivial mitral valve regurgitation.  6. Tricuspid valve regurgitation is moderate.  7. The aortic valve is tricuspid. Aortic valve regurgitation is not visualized. No aortic stenosis is present.  8. The inferior vena cava is normal in size with greater than 50% respiratory variability, suggesting right atrial pressure of 3 mmHg. FINDINGS  Left Ventricle: Left ventricular ejection fraction, by estimation, is >75%. The left ventricle has hyperdynamic function. The left ventricle has no regional wall motion abnormalities. The left ventricular internal cavity size was normal in size. There is severe left ventricular hypertrophy. Left ventricular diastolic parameters are indeterminate. Right Ventricle: The right ventricular size is mildly enlarged. No  increase in right ventricular wall thickness. Right ventricular systolic function is moderately reduced. There is normal pulmonary artery systolic pressure. The tricuspid regurgitant velocity is 1.84 m/s, and with an assumed right atrial pressure of 3 mmHg, the estimated right ventricular systolic pressure is 16.5 mmHg. Left Atrium: Left atrial size was moderately dilated. Right Atrium: Right atrial size was moderately dilated. Pericardium: There is no evidence of pericardial effusion. Mitral Valve: The mitral valve is normal in structure. Mild mitral annular calcification. Trivial  mitral valve regurgitation. MV peak gradient, 2.6 mmHg. The mean mitral valve gradient is 0.0 mmHg. Tricuspid Valve: The tricuspid valve is normal in structure. Tricuspid valve regurgitation is moderate. Aortic Valve: The aortic valve is tricuspid. Aortic valve regurgitation is not visualized. No aortic stenosis is present. Aortic valve mean gradient measures 2.0 mmHg. Aortic valve peak gradient measures 5.1 mmHg. Aortic valve area, by VTI measures 2.18 cm. Pulmonic Valve: The pulmonic valve was normal in structure. Pulmonic valve regurgitation is trivial. Aorta: The aortic root is normal in size and structure. Pulmonary Artery: The pulmonary artery is of normal size. Venous: The inferior vena cava is normal in size with greater than 50% respiratory variability, suggesting right atrial pressure of 3 mmHg. IAS/Shunts: No atrial level shunt detected by color flow Doppler.  LEFT VENTRICLE PLAX 2D LVIDd:         4.10 cm LVIDs:         2.00 cm LV PW:         1.90 cm LV IVS:        1.90 cm LVOT diam:     2.00 cm LV SV:         61 LV SV Index:   31 LVOT Area:     3.14 cm  RIGHT VENTRICLE RV Basal diam:  4.05 cm RV Mid diam:    3.00 cm RV S prime:     5.01 cm/s LEFT ATRIUM              Index        RIGHT ATRIUM           Index LA diam:        4.60 cm  2.30 cm/m   RA Area:     27.10 cm LA Vol (A2C):   107.0 ml 53.57 ml/m  RA Volume:   83.70 ml  41.91 ml/m LA Vol (A4C):   103.0 ml 51.57 ml/m LA Biplane Vol: 108.0 ml 54.08 ml/m  AORTIC VALVE                    PULMONIC VALVE AV Area (Vmax):    2.46 cm     PV Vmax:       0.47 m/s AV Area (Vmean):   2.16 cm     PV Peak grad:  0.9 mmHg AV Area (VTI):     2.18 cm AV Vmax:           113.00 cm/s AV Vmean:          68.900 cm/s AV VTI:            0.281 m AV Peak Grad:      5.1 mmHg AV Mean Grad:      2.0 mmHg LVOT Vmax:         88.60 cm/s LVOT Vmean:        47.300 cm/s LVOT VTI:          0.195 m LVOT/AV VTI ratio: 0.69  AORTA  Ao Root diam: 3.00 cm MITRAL VALVE                TRICUSPID VALVE MV Area (PHT): 3.17 cm    TR Peak grad:   13.5 mmHg MV Area VTI:   2.63 cm    TR Vmax:        184.00 cm/s MV Peak grad:  2.6 mmHg MV Mean grad:  0.0 mmHg    SHUNTS MV Vmax:       0.81 m/s    Systemic VTI:  0.20 m MV Vmean:      26.9 cm/s   Systemic Diam: 2.00 cm MV Decel Time: 239 msec MV E velocity: 70.30 cm/s Yvonne Kendall MD Electronically signed by Yvonne Kendall MD Signature Date/Time: 03/13/2023/1:12:28 PM    Final    MR BRAIN WO CONTRAST  Result Date: 03/12/2023 CLINICAL DATA:  Right facial droop EXAM: MRI HEAD WITHOUT CONTRAST MRA HEAD WITHOUT CONTRAST MRA NECK WITHOUT CONTRAST TECHNIQUE: Multiplanar, multiecho pulse sequences of the brain and surrounding structures were obtained without intravenous contrast. Angiographic images of the Circle of Willis were obtained using MRA technique without intravenous contrast. Angiographic images of the neck were obtained using MRA technique without intravenous contrast. Carotid stenosis measurements (when applicable) are obtained utilizing NASCET criteria, using the distal internal carotid diameter as the denominator. COMPARISON:  01/27/2023 FINDINGS: MRI HEAD FINDINGS Brain: Multiple scattered bilateral small acute infarcts. The largest lesions are located in the right cerebellum, left temporal lobe and left basal ganglia. Aside from the right cerebellar lesion, all lesions are on the left. Multiple chronic microhemorrhages in a predominantly central distribution. There is confluent hyperintense T2-weighted signal within the white matter. Generalized volume loss. Ex vacuo dilatation of the right lateral ventricle. The midline structures are normal. Vascular: Major flow voids are preserved. Skull and upper cervical spine: Normal calvarium and skull base. Visualized upper cervical spine and soft tissues are normal. Sinuses/Orbits:No paranasal sinus fluid levels or advanced mucosal thickening. No mastoid or middle ear effusion. Normal orbits. MRA  HEAD FINDINGS POSTERIOR CIRCULATION: --Vertebral arteries: Loss of flow related enhancement within the left V4 segment. Normal left PICA. --Inferior cerebellar arteries: Normal. --Basilar artery: Normal. --Superior cerebellar arteries: Normal. --Posterior cerebral arteries: Normal. The right PCA is predominantly supplied by the posterior communicating artery. ANTERIOR CIRCULATION: --Intracranial internal carotid arteries: Atherosclerotic irregularity of the internal carotid arteries at the skull base, left-greater-than-right. --Anterior cerebral arteries (ACA): Normal. --Middle cerebral arteries (MCA): Normal. MRA NECK FINDINGS Motion degraded MRA of the neck. Aortic arch: Unremarkable Right carotid system: There is signal loss of the distal common carotid artery. The right ICA is patent. Left carotid system: There is atherosclerotic irregularity throughout the left carotid system with an area of signal loss near the carotid bifurcation. Vertebral arteries: Right dominant.  No stenosis. IMPRESSION: 1. Multiple scattered bilateral small acute infarcts. The largest lesions are located in the right cerebellum, left temporal lobe and left basal ganglia. No hemorrhage or mass effect. 2. Loss of flow related enhancement within the left vertebral artery V4 segment, consistent with slow flow or occlusion. 3. Signal loss of the distal right common carotid artery and left internal carotid artery near the carotid bifurcation. This may be exaggerated by motion artifact, but there is likely a component of stenosis or occlusion. CTA of the neck may be helpful for further characterization. 4. 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 Electronically Signed   By: Chrisandra Netters.D.  On: 03/12/2023 21:48   MR ANGIO NECK WO CONTRAST  Result Date: 03/12/2023 CLINICAL DATA:  Right facial droop EXAM: MRI HEAD WITHOUT CONTRAST MRA HEAD WITHOUT  CONTRAST MRA NECK WITHOUT CONTRAST TECHNIQUE: Multiplanar, multiecho pulse sequences of the brain and surrounding structures were obtained without intravenous contrast. Angiographic images of the Circle of Willis were obtained using MRA technique without intravenous contrast. Angiographic images of the neck were obtained using MRA technique without intravenous contrast. Carotid stenosis measurements (when applicable) are obtained utilizing NASCET criteria, using the distal internal carotid diameter as the denominator. COMPARISON:  01/27/2023 FINDINGS: MRI HEAD FINDINGS Brain: Multiple scattered bilateral small acute infarcts. The largest lesions are located in the right cerebellum, left temporal lobe and left basal ganglia. Aside from the right cerebellar lesion, all lesions are on the left. Multiple chronic microhemorrhages in a predominantly central distribution. There is confluent hyperintense T2-weighted signal within the white matter. Generalized volume loss. Ex vacuo dilatation of the right lateral ventricle. The midline structures are normal. Vascular: Major flow voids are preserved. Skull and upper cervical spine: Normal calvarium and skull base. Visualized upper cervical spine and soft tissues are normal. Sinuses/Orbits:No paranasal sinus fluid levels or advanced mucosal thickening. No mastoid or middle ear effusion. Normal orbits. MRA HEAD FINDINGS POSTERIOR CIRCULATION: --Vertebral arteries: Loss of flow related enhancement within the left V4 segment. Normal left PICA. --Inferior cerebellar arteries: Normal. --Basilar artery: Normal. --Superior cerebellar arteries: Normal. --Posterior cerebral arteries: Normal. The right PCA is predominantly supplied by the posterior communicating artery. ANTERIOR CIRCULATION: --Intracranial internal carotid arteries: Atherosclerotic irregularity of the internal carotid arteries at the skull base, left-greater-than-right. --Anterior cerebral arteries (ACA): Normal.  --Middle cerebral arteries (MCA): Normal. MRA NECK FINDINGS Motion degraded MRA of the neck. Aortic arch: Unremarkable Right carotid system: There is signal loss of the distal common carotid artery. The right ICA is patent. Left carotid system: There is atherosclerotic irregularity throughout the left carotid system with an area of signal loss near the carotid bifurcation. Vertebral arteries: Right dominant.  No stenosis. IMPRESSION: 1. Multiple scattered bilateral small acute infarcts. The largest lesions are located in the right cerebellum, left temporal lobe and left basal ganglia. No hemorrhage or mass effect. 2. Loss of flow related enhancement within the left vertebral artery V4 segment, consistent with slow flow or occlusion. 3. Signal loss of the distal right common carotid artery and left internal carotid artery near the carotid bifurcation. This may be exaggerated by motion artifact, but there is likely a component of stenosis or occlusion. CTA of the neck may be helpful for further characterization. 4. 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 Electronically Signed   By: Deatra Robinson M.D.   On: 03/12/2023 21:48   MR ANGIO HEAD WO CONTRAST  Result Date: 03/12/2023 CLINICAL DATA:  Right facial droop EXAM: MRI HEAD WITHOUT CONTRAST MRA HEAD WITHOUT CONTRAST MRA NECK WITHOUT CONTRAST TECHNIQUE: Multiplanar, multiecho pulse sequences of the brain and surrounding structures were obtained without intravenous contrast. Angiographic images of the Circle of Willis were obtained using MRA technique without intravenous contrast. Angiographic images of the neck were obtained using MRA technique without intravenous contrast. Carotid stenosis measurements (when applicable) are obtained utilizing NASCET criteria, using the distal internal carotid diameter as the denominator. COMPARISON:  01/27/2023 FINDINGS: MRI HEAD FINDINGS Brain:  Multiple scattered bilateral small acute infarcts. The largest lesions are located in the right cerebellum, left temporal lobe and left basal ganglia. Aside from the right  cerebellar lesion, all lesions are on the left. Multiple chronic microhemorrhages in a predominantly central distribution. There is confluent hyperintense T2-weighted signal within the white matter. Generalized volume loss. Ex vacuo dilatation of the right lateral ventricle. The midline structures are normal. Vascular: Major flow voids are preserved. Skull and upper cervical spine: Normal calvarium and skull base. Visualized upper cervical spine and soft tissues are normal. Sinuses/Orbits:No paranasal sinus fluid levels or advanced mucosal thickening. No mastoid or middle ear effusion. Normal orbits. MRA HEAD FINDINGS POSTERIOR CIRCULATION: --Vertebral arteries: Loss of flow related enhancement within the left V4 segment. Normal left PICA. --Inferior cerebellar arteries: Normal. --Basilar artery: Normal. --Superior cerebellar arteries: Normal. --Posterior cerebral arteries: Normal. The right PCA is predominantly supplied by the posterior communicating artery. ANTERIOR CIRCULATION: --Intracranial internal carotid arteries: Atherosclerotic irregularity of the internal carotid arteries at the skull base, left-greater-than-right. --Anterior cerebral arteries (ACA): Normal. --Middle cerebral arteries (MCA): Normal. MRA NECK FINDINGS Motion degraded MRA of the neck. Aortic arch: Unremarkable Right carotid system: There is signal loss of the distal common carotid artery. The right ICA is patent. Left carotid system: There is atherosclerotic irregularity throughout the left carotid system with an area of signal loss near the carotid bifurcation. Vertebral arteries: Right dominant.  No stenosis. IMPRESSION: 1. Multiple scattered bilateral small acute infarcts. The largest lesions are located in the right cerebellum, left temporal lobe and left basal  ganglia. No hemorrhage or mass effect. 2. Loss of flow related enhancement within the left vertebral artery V4 segment, consistent with slow flow or occlusion. 3. Signal loss of the distal right common carotid artery and left internal carotid artery near the carotid bifurcation. This may be exaggerated by motion artifact, but there is likely a component of stenosis or occlusion. CTA of the neck may be helpful for further characterization. 4. 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 Electronically Signed   By: Deatra Robinson M.D.   On: 03/12/2023 21:48   CT HEAD CODE STROKE WO CONTRAST  Result Date: 03/12/2023 CLINICAL DATA:  Code stroke. Neuro deficit, acute, stroke suspected. EXAM: CT HEAD WITHOUT CONTRAST TECHNIQUE: Contiguous axial images were obtained from the base of the skull through the vertex without intravenous contrast. RADIATION DOSE REDUCTION: This exam was performed according to the departmental dose-optimization program which includes automated exposure control, adjustment of the mA and/or kV according to patient size and/or use of iterative reconstruction technique. COMPARISON:  MRI brain 01/27/2023. FINDINGS: Brain: No acute intracranial hemorrhage. New area of hypoattenuation in the left inferior temporal gyrus. Unchanged encephalomalacia in the right posterior frontal lobe and basal ganglia from prior right MCA territory infarct. Unchanged old infarct in the right occipital lobe and severe chronic small-vessel disease. No hydrocephalus or extra-axial collection. No mass effect or midline shift. Vascular: No hyperdense vessel or unexpected calcification. Skull: No calvarial fracture or suspicious bone lesion. Skull base is unremarkable. Sinuses/Orbits: Unremarkable. Other: None. ASPECTS (Alberta Stroke Program Early CT Score) - Ganglionic level infarction (caudate, lentiform nuclei, internal capsule, insula,  M1-M3 cortex): 7 - Supraganglionic infarction (M4-M6 cortex): 3 Total score (0-10 with 10 being normal): 10 IMPRESSION: 1. New area of hypoattenuation in the left inferior temporal gyrus, concerning for acute infarct. No acute intracranial hemorrhage. 2. ASPECT score is 10. Code stroke imaging results were communicated on 03/12/2023 at 4:44 pm to provider Dr. Amada Jupiter via secure text paging. Electronically Signed   By: Orvan Falconer M.D.   On: 03/12/2023 16:45  US Venous Img Upper Uni Right(DVT)  Result Date: 03/12/2023 CLINICAL DATA:  Right upper extremity pain and edema. EXAM: RIGHT UPPER EXTREMITY VENOUS DOPPLER ULTRASOUND TECHNIQUE: Gray-scale sonography with graded compression, as well as color Doppler and duplex ultrasound were performed to evaluate the upper extremity deep venous system from the level of the subclavian vein and including the jugular, axillary, basilic, radial, ulnar and upper cephalic vein. Spectral Doppler was utilized to evaluate flow at rest and with distal augmentation maneuvers. COMPARISON:  None Available. FINDINGS: Contralateral Subclavian Vein: Respiratory phasicity is normal and symmetric with the symptomatic side. No evidence of thrombus. Normal compressibility. Internal Jugular Vein: No evidence of thrombus. Normal compressibility, respiratory phasicity and response to augmentation. Subclavian Vein: No evidence of thrombus. Normal compressibility, respiratory phasicity and response to augmentation. Axillary Vein: No evidence of thrombus. Normal compressibility, respiratory phasicity and response to augmentation. Cephalic Vein: Superficial thrombophlebitis of a segment of the right cephalic vein in the forearm with associated venous distension. Basilic Vein: No evidence of thrombus. Normal compressibility, respiratory phasicity and response to augmentation. Brachial Veins: No evidence of thrombus. Normal compressibility, respiratory phasicity and response to augmentation.  Radial Veins: No evidence of thrombus. Normal compressibility, respiratory phasicity and response to augmentation. Ulnar Veins: No evidence of thrombus. Normal compressibility, respiratory phasicity and response to augmentation. Venous Reflux:  None visualized. Other Findings:  No abnormal fluid collections identified. IMPRESSION: 1. No evidence of DVT within the right upper extremity. 2. Superficial thrombophlebitis of a segment of the right cephalic vein in the forearm. Electronically Signed   By: Irish Lack M.D.   On: 03/12/2023 12:24   US RENAL  Result Date: 03/11/2023 CLINICAL DATA:  6274 Acute kidney failure, unspecified (HCC) [id number] Chronic kidney disease (CKD) stage G3a/A1, moderately decreased glomerular filtration rate (GFR) between 45-59 mL/min EXAM: RENAL / URINARY TRACT ULTRASOUND COMPLETE COMPARISON:  03/08/2023 FINDINGS: Right Kidney: Renal measurements: 9.6 x 4.3 x 5.2 cm = volume: 109 mL. Parenchyma is hypoechoic compared to adjacent liver. 1.6 x 1.5 cm cortical cyst partially exophytic mid kidney. No hydronephrosis. Left Kidney: Renal measurements: 10.6 x 5.2 x 5.2 cm = volume: 152 mL. 2.3 x 1.7 x 2 cm simple cyst mid kidney. No hydronephrosis. Bladder: Physiologically distended. Other: None. IMPRESSION: 1. No hydronephrosis. 2. Bilateral simple renal cysts. Electronically Signed   By: Corlis Leak M.D.   On: 03/11/2023 16:20   US RENAL ARTERY DUPLEX LIMITED  Result Date: 03/08/2023 CLINICAL DATA:  Malignant hypertension EXAM: RENAL/URINARY TRACT ULTRASOUND RENAL DUPLEX DOPPLER ULTRASOUND COMPARISON:  CT 04/05/2008 FINDINGS: Right Kidney: Length: 9.8 x 4.8 x 5.3 cm (128 cc). Isoechoic to adjacent liver. 1.6 x 1.3 x 1.2 cm midpole cyst. No hydronephrosis. Left Kidney: Length: 10.6 x 5.5 x 4.7 cm (144 cc). No hydronephrosis. 2.3 x 2 cm lower pole cortical cyst. Bladder:  Not imaged RENAL DUPLEX ULTRASOUND Right Renal Artery Velocities: Origin:  43.8 cm/sec Mid:  47.6 cm/sec Hilum:   43.2 cm/sec Left Renal Artery Velocities: Origin:  70.2 cm/sec Mid:  67.8 cm/sec Hilum:  67.7 cm/sec Aortic Velocity:  141 cm/sec All renal-aortic ratios are well under 1.0. Technologist describes technically difficult study secondary to bowel gas and patient inability to hold breath. IMPRESSION: 1. No Doppler ultrasound evidence of hemodynamically significant renal artery stenosis. 2. If there is continued clinical concern, renal MRA (lower radiation risk, can be performed noncontrast in the setting of renal dysfunction) and CTA ( higher spatial resolution) represent more accurate studies, which are additionally more  sensitive to the detection of duplicated renal arteries. Electronically Signed   By: Corlis Leak M.D.   On: 03/08/2023 18:26   DG Chest Port 1 View  Result Date: 03/08/2023 CLINICAL DATA:  Hypotension EXAM: PORTABLE CHEST 1 VIEW COMPARISON:  08/22/2007 FINDINGS: Single frontal view of the chest demonstrates an enlarged cardiac silhouette. There is atherosclerosis of the thoracic aorta, bilateral subclavian arteries, and bilateral carotid arteries. No acute airspace disease, effusion, or pneumothorax. No acute bony abnormalities. IMPRESSION: 1. Enlarged cardiac silhouette. 2. No acute airspace disease. 3. Extensive atherosclerosis. Electronically Signed   By: Sharlet Salina M.D.   On: 03/08/2023 14:53    ECHO  INTERPRETATION  NORMAL LEFT VENTRICULAR SYSTOLIC FUNCTION   WITH MODERATE LVH  NORMAL RIGHT VENTRICULAR SYSTOLIC FUNCTION  MILD VALVULAR REGURGITATION (See above)  NO VALVULAR STENOSIS  IRREGULAR HEART RHYTHM CAPTURED THROUGHOUT EXAM  ESTIMATED LVEF >55%  CALCULATED: 58.3%  GLS: -16.7%  MILD MR, TR, PI  SEVERE LAE  _________________________________________________________________________________________  Electronically signed by    Dorothyann Peng, MD on 01/08/2023 04: 59 PM           Performed By: Verdis Prime     Ordering Physician: Dorothyann Peng, MD   Steffanie Dunn  01/08/2023 Impression   Moderately abnormal myocardial perfusion scan there is evidence of left ventricular enlargement there is mild /moderate anterior apical defect of moderate intensity with mixed defect concerning for infarct versus ischemia.  Overall ejection fraction around 45% with anterior apical hypokinesis.  This is a moderate to high risk and recommend consider further evaluation invasively possibly cardiac cath  TELEMETRY reviewed by me  03/15/2023 : Sinus bradycardia rate 49-sinus rhythm in the 60s  EKG reviewed by me: junctional brady with nonspecific IVCD and nonspecific T wave changes  Data reviewed by me 03/15/2023: Neurology note, nursing notes, PCCM notes, vascular surgery notes , last 24h vitals tele labs imaging I/O   Principal Problem:   Malignant hypertension Active Problems:   Coronary artery disease   Carotid stenosis   Diabetes mellitus without complication (HCC)   Acute CVA (cerebrovascular accident) (HCC)   AKI (acute kidney injury) (HCC)    ASSESSMENT AND PLAN:  Karina Leonard is an 71yoF with a PMH of HTN, bilateral carotid stenosis (70% RICA, 80% LICA), hx CVA/TIA, HFpEF (55%), abnormal lexiscan myoview (01/08/23), sinus arrhythmia who presented to [facility] for elective left carotid endarterectomy with vascular surgery. The patient's BP was extremely elevated and procedure was cancelled and was admitted for BP control.  Cardiology is consulted for further assistance.  # CVA Code stroke called at 1630 on 5/20 for new onset R sided facial drooping and R arm weakness. CT positive, MRI demonstrated multiple bilateral small acute infarcts with largest lesions in the R cerebellum, L temporal lobe, and L basal ganglia.  -Holding Heparin. Continue Aspirin 81 mg daily and Pravastatin 40 mg daily.  -Neurology following.  -Repeat echo shows hyperdynamic LV function EF >75% -Continue to monitor on telemetry for arrhythmias, no evidence thus far of atrial  fibrillation.  Jonathon Bellows nursing staff to place 30-day event monitor today  # Malignant hypertension # Left carotid artery stenosis # Junctional bradycardia  Suspect the patient's presyncopal symptoms and minimal responsiveness afternoon of 5/16 were iatrogenic from aggressive measures to control her BP with rapid blood pressure fluctuations from 280/70s down to as low as 90s/60s.  Symptoms improved after her BP rose back to 150 systolic.  She has explicitly denied SOB, nausea, dizziness and is currently  asymptomatic despite her bradycardia.  -Agree with current therapy per hospitalist and vascular surgery -No indication for temporary transvenous pacemaker or permanent pacemaker at this time. Monitor for worsening bradycardia.  -Amlodipine increased to 10 mg for better BP control.  -Clonidine patch reordered per nephrology, weaning p.o. clonidine over the next several days.  Appreciate pharmacy assistance. -Hydralazine as needed for elevated BP. Holding nephrotoxic medications given renal function.  -Vascular surgery plans to defer CEA during this hospitalization, pending better BP control at home. -Secondary hypertension workup pending, renal duplex negative however vascular does have concern for some level of RAS.  They are planning to work this up outpatient -Monitor and replenish electrolytes for goal K >4, mag >2 -Defer additional cardiac diagnostics at this time  # Demand Ischemia vs ACS # Hx Abnormal Lexiscan Myoview Patient has a history of abnormal Lexiscan Myoview in 2021 (anterolateral defect with border zone redistribution, mild LV dilatation during stress and moderately reduced EF of 45-50%).  Most recently in April 2024 with moderate anterior apical hypokinesis with a mixed fixed versus reversible defect, it seems that since the patient was asymptomatic, chest pain-free further invasive cardiac diagnostics were deferred. -Patient remains without shortness of breath, dizziness, nausea,  diaphoresis. Now reporting some chest discomfort that is atypical.  This is reproducible on palpation -Troponins this admission 353 on 5/17 repeating on 5/20 and trended 2765 > 3149 > 2667 > 1734. Repeat EKG 5/20 reviewed by Dr. Juliann Pares. -Suspect troponin elevated 2/2 significantly labile BP. EKGs do show nonspecific IVCD and nonspecific T wave changes which, in the absence of chest pain and in the setting of very labile BPs is non-diagnostic.   -Patient received heparin for about 5 hours before this was discontinued given concern for acute stroke. -As above, defer additional cardiac diagnostics at this time  #AKI on CKD Stage IIIa Cr still labile, trending 3.37--3.46--2.54--2.91 with current GFR 16. -Appreciate nephrology input, continue holding nephrotoxic medications.   # Sinus arrhythmia The patient carries a diagnosis of paroxysmal atrial fibrillation in her chart likely from an EKG obtained on 5/7 which the computer read as atrial fibrillation. I reviewed this EKG with by Dr. Juliann Pares who agrees the computer read is erroneous, and the rhythm is sinus bradycardia with premature atrial contractions.  She does not have an indication for chronic anticoagulation or a diagnosis of atrial fibrillation based on this EKG tracing. -30-day monitor as above  Cardiology will sign off. Please haiku with questions or re-engage if needed.  Will arrange for follow-up with Dr. Juliann Pares 1-2 weeks after discharge.  This patient's plan of care was discussed and created with Dr. Juliann Pares  and he is in agreement.  Signed: Rebeca Allegra , PA-C 03/15/2023, 9:38 AM [facility]

## 2023-03-15 NOTE — Progress Notes (Signed)
PROGRESS NOTE    JAXX AUD  YNW:[id number] DOB: June 15, 1941 DOA: 03/08/2023 PCP: Duanne Limerick, MD  Outpatient Specialists: vascular surgery, cardiology    Brief Narrative:   This is an 82 yo female with carotid artery stenosis who presented for an elective left carotid endarterectomy per vascular surgery on 05/16. However, procedure canceled due to severe hypertension sbp 200's. Pt subsequently admitted to the stepdown unit per vascular surgery and nitroglycerin gtt initiated for bp control. Pt also restarted on her outpatient antihypertensives. However, she remained hypertensive and nicardipine gtt added. Pt later developed symptomatic hypotension sbp 90's to low 100's. According to pts daughters pts baseline sbp 140-160's. PCCM team consulted and levophed gtt initiated.  05/16: Pt initially admitted to the stepdown unit with hypertensive urgency and later developed symptomatic hypotension secondary to antihypertensive medication requiring levophed gtt PCCM team consulted to assist with management  05/18: Pt had another episode of accelerated hypertension we have reviewed            medications, cleviprex gtt started overnight. Patient seems to have developed hydralazine        tolerance with rebound effect. Have increased HCTZ to 50 from 25.  Have met with        cardiology - Dr Juliann Pares , amlodipine 5 bid has been initiated. Will consider additional        therapy as needed while weaning down cleviprex gtt.  Her Chronic renal impairment        seems to have acute component with oliguric aki, will get nephrology to evaluate.  05/19: Pt in no distress this am. BP still elevated and still requires cleviprex titration with the PO regimen. Nephrology restarted clonidine due to concern of rebound hypertension following discontinuation of outpatient clonidine patch  05/20: Cleviprex gtt currently off.  SBP improved significantly now 130-150.  Pt now with increasing troponin [phone]  Cardiology following and aware 05/20: MRA Head/Neck: multiple scattered bilateral small acute infarcts. The largest        lesions are located in the right cerebellum, left temporal lobe and left basal ganglia. No        hemorrhage or mass effect. Loss of flow related enhancement within the left vertebral        artery V4 segment, consistent with slow flow or occlusion. Signal loss of the distal right        common carotid artery and left internal carotid artery near the carotid bifurcation. This        may be exaggerated by motion artifact, but there is likely a component of stenosis or        occlusion. CTA of the neck may be helpful for further characterization. Multiple chronic        microhemorrhages in a predominantly central distribution, likely due to chronic        hypertensive microangiopathy. 05/21: Code stroke initiated on 05/20 due to new onset right-sided facial droop and RUE        weakness.  CT Head revealed new area of hypoattenuation in the left inferior temporal        gyrus, concerning for acute infarct. No acute intracranial hemorrhage. ASPECT score is 10.   Assessment & Plan:   Principal Problem:   Malignant hypertension Active Problems:   Coronary artery disease   Carotid stenosis   Diabetes mellitus without complication (HCC)   Acute CVA (cerebrovascular accident) (HCC)   AKI (acute kidney injury) (HCC)  # Hypertensive emergency Now with rebound hypertension  after clonidine was discontinued. BPs improving but still elevated. Cardiology manages her htn as outpt - amlodipine increased to 10 qd  - continue clonidine - patch added by nephro and tapering oral - vascular advising further outpatient CTA to eval for RAS - home hctz, lasix, losartan on hold 2/2 aki - hyperaldo and pheo labs drawn on 5/20, pending  # CVA, acute Multiple. Neurology has signed off.  - continue aspirin/statin - cardiac monitor at discharge - endarterectomy planning as below - pt/ot  advising SNF, TOC aware  # Carotid stenosis Vascular advising close outpatient f/u to make plans for endarterectomy - continue aspirin/plavix  # AKI on ckd 3a Baseline cr of 1.17, here peaked at 3s, today 2.92, uop 240 but daughter says more was just discarded. Nephrology following.  - will query nephrology regarding d/c foley  # Demand ischemia vs nstemi Troponin peak in the 3000s. Currently asymptomatic. Cardiology has signed off, they are deferring additional diagnostics - cont asa/statin - close outpt f/u  # Constipation No bm last 3 days - will make miralax standing and 34 g/day, add senna    DVT prophylaxis: heparin Code Status: full Family Communication: daughter updated @ bedside  Level of care: Telemetry Medical Status is: Inpatient Remains inpatient appropriate because: monitoring for renal recovery    Consultants:  Nephrology, cardiology, vascular surgery, neurology  Procedures: none  Antimicrobials:  S/p cipro    Subjective: Feeling well, no chest or other pain, tolerating diet, no headache  Objective: Vitals:   03/15/23 0033 03/15/23 0034 03/15/23 0420 03/15/23 1201  BP: (!) 122/102 (!) 156/43 (!) 145/49 (!) 183/48  Pulse: (!) 52 (!) 51  (!) 52  Resp: 18  18 16   Temp: 98.1 F (36.7 C)  98.4 F (36.9 C) 97.7 F (36.5 C)  TempSrc: Oral     SpO2: 95%  94% 97%  Weight:      Height:        Intake/Output Summary (Last 24 hours) at 03/15/2023 1239 Last data filed at 03/15/2023 1030 Gross per 24 hour  Intake 550.07 ml  Output 275 ml  Net 275.07 ml   Filed Weights   03/08/23 0626 03/08/23 0841  Weight: 87.5 kg 88.1 kg    Examination:  General exam: Appears calm and comfortable  Respiratory system: Clear to auscultation. Respiratory effort normal. Cardiovascular system: S1 & S2 heard, RRR. No JVD, murmurs, rubs, gallops or clicks. No pedal edema. Gastrointestinal system: Abdomen is nondistended, soft and nontender. No organomegaly or  masses felt. Normal bowel sounds heard. Central nervous system: Alert and oriented. No focal neurological deficits. Extremities: Symmetric 5 x 5 power. Skin: No rashes, lesions or ulcers Psychiatry: Judgement and insight appear normal. Mood & affect appropriate.     Data Reviewed: I have personally reviewed following labs and imaging studies  CBC: Recent Labs  Lab 03/08/23 1437 03/09/23 0406 03/11/23 0417 03/12/23 0855 03/13/23 0409 03/14/23 0519  WBC 19.6* 20.6* 17.7* 12.8* 12.3* 20.3*  NEUTROABS 17.4*  --   --   --   --  18.1*  HGB 11.9* 11.9* 11.5* 11.2* 9.8* 11.0*  HCT 36.0 35.5* 34.3* 34.5* 30.2* 32.9*  MCV 92.1 92.2 92.2 93.8 94.7 92.7  PLT 172 204 190 187 173 217   Basic Metabolic Panel: Recent Labs  Lab 03/08/23 1436 03/08/23 1437 03/09/23 0406 03/10/23 0453 03/11/23 0417 03/12/23 0507 03/13/23 0409 03/14/23 0519 03/15/23 0654  NA  --    < > 138   < > 135 134*  132* 133* 133*  K  --    < > 4.5   < > 3.7 3.7 3.9 4.3 4.1  CL  --    < > 107   < > 104 104 102 101 101  CO2  --    < > 23   < > 23 21* 22 23 21*  GLUCOSE  --    < > 125*   < > 128* 124* 123* 156* 123*  BUN  --    < > 38*   < > 61* 65* 73* 77* 83*  CREATININE  --    < > 2.38*   < > 2.64* 3.37* 3.46* 2.54* 2.92*  CALCIUM  --    < > 8.2*   < > 8.2* 7.8* 7.6* 8.2* 7.9*  MG  --    < > 1.6*  --  2.2 2.1 2.1 2.2 2.2  PHOS 4.9*  --  3.2  --   --  4.6 5.6* 4.4  --    < > = values in this interval not displayed.   GFR: Estimated Creatinine Clearance: 17.5 mL/min (A) (by C-G formula based on SCr of 2.92 mg/dL (H)). Liver Function Tests: Recent Labs  Lab 03/08/23 1437 03/09/23 0406 03/12/23 0507  AST 48* 82*  --   ALT 15 37  --   ALKPHOS 77 84  --   BILITOT 1.0 0.9  --   PROT 5.9* 6.5  --   ALBUMIN 3.1* 3.4* 2.7*   No results for input(s): "LIPASE", "AMYLASE" in the last 168 hours. No results for input(s): "AMMONIA" in the last 168 hours. Coagulation Profile: No results for input(s): "INR",  "PROTIME" in the last 168 hours. Cardiac Enzymes: No results for input(s): "CKTOTAL", "CKMB", "CKMBINDEX", "TROPONINI" in the last 168 hours. BNP (last 3 results) No results for input(s): "PROBNP" in the last 8760 hours. HbA1C: No results for input(s): "HGBA1C" in the last 72 hours. CBG: Recent Labs  Lab 03/14/23 1612 03/14/23 2112 03/15/23 0030 03/15/23 0805 03/15/23 1204  GLUCAP 160* 140* 121* 120* 124*   Lipid Profile: Recent Labs    03/13/23 0409  CHOL 113  HDL 39*  LDLCALC 59  TRIG 74  CHOLHDL 2.9   Thyroid Function Tests: No results for input(s): "TSH", "T4TOTAL", "FREET4", "T3FREE", "THYROIDAB" in the last 72 hours. Anemia Panel: No results for input(s): "VITAMINB12", "FOLATE", "FERRITIN", "TIBC", "IRON", "RETICCTPCT" in the last 72 hours. Urine analysis:    Component Value Date/Time   COLORURINE YELLOW (A) 03/11/2023 1637   APPEARANCEUR HAZY (A) 03/11/2023 1637   LABSPEC 1.011 03/11/2023 1637   PHURINE 5.0 03/11/2023 1637   GLUCOSEU NEGATIVE 03/11/2023 1637   HGBUR SMALL (A) 03/11/2023 1637   BILIRUBINUR NEGATIVE 03/11/2023 1637   BILIRUBINUR negative 01/21/2020 1202   KETONESUR NEGATIVE 03/11/2023 1637   PROTEINUR 30 (A) 03/11/2023 1637   UROBILINOGEN 0.2 01/21/2020 1202   NITRITE NEGATIVE 03/11/2023 1637   LEUKOCYTESUR LARGE (A) 03/11/2023 1637   Sepsis Labs: @LABRCNTIP (procalcitonin:4,lacticidven:4)  ) Recent Results (from the past 240 hour(s))  Urine Culture (for pregnant, neutropenic or urologic patients or patients with an indwelling urinary catheter)     Status: Abnormal   Collection Time: 03/12/23  8:11 AM   Specimen: Urine, Random  Result Value Ref Range Status   Specimen Description   Final    URINE, RANDOM Performed at [facility], [address], [place], [place] [address]    Special Requests   Final    NONE Performed  at [facility], [address] [place], [place] [address]    Culture (A)  Final    <10,000  COLONIES/mL INSIGNIFICANT GROWTH Performed at [facility], [address] [place], [place] [address]    Report Status 03/13/2023 FINAL  Final         Radiology Studies: US Carotid Bilateral  Result Date: 03/13/2023 CLINICAL DATA:  Carotid stenosis EXAM: BILATERAL CAROTID DUPLEX ULTRASOUND TECHNIQUE: Wallace Cullens scale imaging, color Doppler and duplex ultrasound were performed of bilateral carotid and vertebral arteries in the neck. COMPARISON:  None Available. FINDINGS: Criteria: Quantification of carotid stenosis is based on velocity parameters that correlate the residual internal carotid diameter with NASCET-based stenosis levels, using the diameter of the distal internal carotid lumen as the denominator for stenosis measurement. The following velocity measurements were obtained: RIGHT ICA: 116/16 cm/sec CCA: 121/19 cm/sec SYSTOLIC ICA/CCA RATIO:  1.0 ECA:  50 cm/sec LEFT ICA: 95/23 cm/sec CCA: 218/27 cm/sec SYSTOLIC ICA/CCA RATIO:  0.4 ECA:  105 cm/sec RIGHT CAROTID ARTERY: Extensive calcified atherosclerotic plaque throughout the visualized carotid artery. By peak systolic velocity criteria, estimated stenosis is less than 50%. RIGHT VERTEBRAL ARTERY:  Patent with normal antegrade flow. LEFT CAROTID ARTERY: Calcified atherosclerotic plaque throughout the visualized carotid system. By peak systolic velocity criteria, the estimated stenosis is less than 50%. LEFT VERTEBRAL ARTERY:  Patent with normal antegrade flow. IMPRESSION: 1. Mild (1-49%) stenosis proximal right internal carotid artery secondary to extensive echogenic/calcified atherosclerotic plaque. 2. Mild (1-49%) stenosis proximal left internal carotid artery secondary to extensive echogenic/calcified atherosclerotic plaque. 3. Vertebral arteries are patent with normal antegrade flow. Electronically Signed   By: Malachy Moan M.D.   On: 03/13/2023 15:15        Scheduled Meds:  amLODipine  10 mg Oral Daily   aspirin EC  81 mg Oral  Daily   Chlorhexidine Gluconate Cloth  6 each Topical Daily   cloNIDine  0.1 mg Transdermal Weekly   cloNIDine  0.1 mg Oral BID   [START ON 03/17/2023] cloNIDine  0.1 mg Oral Daily   docusate sodium  100 mg Oral BID   fluticasone  1 spray Each Nare Daily   heparin  5,000 Units Subcutaneous Q8H   insulin aspart  0-5 Units Subcutaneous QHS   insulin aspart  0-9 Units Subcutaneous TID WC   loratadine  10 mg Oral Daily   oxyCODONE  5 mg Oral Once   pantoprazole  40 mg Oral Daily   pravastatin  40 mg Oral q1800   venlafaxine XR  75 mg Oral QHS   Continuous Infusions:  sodium chloride Stopped (03/09/23 1239)     LOS: 7 days     Silvano Bilis, MD [facility]   If 7PM-7AM, please contact night-coverage [email or web address] Password [facility] 03/15/2023, 12:39 PM

## 2023-03-16 ENCOUNTER — Encounter: Payer: Self-pay | Admitting: Vascular Surgery

## 2023-03-16 DIAGNOSIS — I129 Hypertensive chronic kidney disease with stage 1 through stage 4 chronic kidney disease, or unspecified chronic kidney disease: Secondary | ICD-10-CM

## 2023-03-16 DIAGNOSIS — N189 Chronic kidney disease, unspecified: Secondary | ICD-10-CM

## 2023-03-16 DIAGNOSIS — I10 Essential (primary) hypertension: Secondary | ICD-10-CM | POA: Diagnosis not present

## 2023-03-16 DIAGNOSIS — I169 Hypertensive crisis, unspecified: Secondary | ICD-10-CM | POA: Diagnosis not present

## 2023-03-16 DIAGNOSIS — N179 Acute kidney failure, unspecified: Secondary | ICD-10-CM | POA: Diagnosis not present

## 2023-03-16 DIAGNOSIS — I6523 Occlusion and stenosis of bilateral carotid arteries: Secondary | ICD-10-CM | POA: Diagnosis not present

## 2023-03-16 LAB — BASIC METABOLIC PANEL
Anion gap: 11 (ref 5–15)
BUN: 94 mg/dL — ABNORMAL HIGH (ref 8–23)
CO2: 20 mmol/L — ABNORMAL LOW (ref 22–32)
Calcium: 8.2 mg/dL — ABNORMAL LOW (ref 8.9–10.3)
Chloride: 103 mmol/L (ref 98–111)
Creatinine, Ser: 2.77 mg/dL — ABNORMAL HIGH (ref 0.44–1.00)
GFR, Estimated: 17 mL/min — ABNORMAL LOW (ref >60)
Glucose, Bld: 124 mg/dL — ABNORMAL HIGH (ref 70–99)
Potassium: 4 mmol/L (ref 3.5–5.1)
Sodium: 134 mmol/L — ABNORMAL LOW (ref 135–145)

## 2023-03-16 LAB — ALDOSTERONE + RENIN ACTIVITY W/ RATIO
ALDO / PRA Ratio: 0.3 (ref 0.0–30.0)
Aldosterone: 6.8 ng/dL (ref 0.0–30.0)
PRA LC/MS/MS: 19.919 ng/mL/hr — ABNORMAL HIGH (ref 0.167–5.380)

## 2023-03-16 LAB — CATECHOLAMINES, FRACTIONATED, PLASMA
Dopamine: <30 pg/mL (ref 0–48)
Epinephrine: <15 pg/mL (ref 0–62)
Norepinephrine: 684 pg/mL (ref 0–874)

## 2023-03-16 LAB — GLUCOSE, CAPILLARY
Glucose-Capillary: 125 mg/dL — ABNORMAL HIGH (ref 70–99)
Glucose-Capillary: 149 mg/dL — ABNORMAL HIGH (ref 70–99)
Glucose-Capillary: 139 mg/dL — ABNORMAL HIGH (ref 70–99)
Glucose-Capillary: 134 mg/dL — ABNORMAL HIGH (ref 70–99)

## 2023-03-16 MED ORDER — HYDRALAZINE HCL 10 MG PO TABS
10.0000 mg | ORAL_TABLET | Freq: Four times a day (QID) | ORAL | Status: DC
  Administered 2023-03-16 – 2023-03-17 (×5): 10 mg via ORAL
  Filled 2023-03-16 (×5): qty 1

## 2023-03-16 NOTE — TOC Progression Note (Signed)
Transition of Care [facility]) - Progression Note    Patient Details  Name: Karina Leonard MRN: [id number] Date of Birth: 01/08/1941  [facility]) CM/SW Contact  Margarito Liner, LCSW Phone Number: 03/16/2023, 3:19 PM  Clinical Narrative:  Patient asleep. Met with daughter at bedside and provided bed offers. She is concerned about star ratings for facilities that have offered and asked about facilities in [place]. CSW printed list of CMS scores for facilities within 25 miles of [facility] for her to review. She plans to call patient's insurance company to see who is in network.  Expected Discharge Plan: Home w Home Health Services Barriers to Discharge: Continued Medical Work up  Expected Discharge Plan and Services       Living arrangements for the past 2 months: Single Family Home                                       Social Determinants of Health (SDOH) Interventions SDOH Screenings   Food Insecurity: No Food Insecurity (03/08/2023)  Housing: Low Risk  (03/08/2023)  Transportation Needs: No Transportation Needs (03/08/2023)  Utilities: Not At Risk (03/08/2023)  Alcohol Screen: Low Risk  (03/22/2022)  Depression (PHQ2-9): Low Risk  (02/02/2023)  Financial Resource Strain: Low Risk  (03/22/2022)  Physical Activity: Insufficiently Active (03/22/2022)  Social Connections: Moderately Isolated (03/22/2022)  Stress: No Stress Concern Present (03/22/2022)  Tobacco Use: Medium Risk (03/16/2023)    Readmission Risk Interventions     No data to display

## 2023-03-16 NOTE — Progress Notes (Signed)
Physical Therapy Treatment Patient Details Name: Karina Leonard MRN: [id number] DOB: 02-12-1941 Today's Date: 03/16/2023   History of Present Illness Pt is an 82 yo female with PMH that includes carotid artery stenosis, A-fib with slow ventricular response, bradycardia, CAD, DDD, depression, diastolic dysfunction, lipoma, HTN, pre-diabetes, TIA, and sciatica who presented for an elective left carotid endarterectomy per vascular surgery on 05/16.  However, procedure canceled due to severe hypertension sbp 200's.  Pt subsequently admitted to the stepdown. MD assessment includes: multiple bilateral small acute infarcts with largest lesions in the R cerebellum, L temporal lobe, and L basal ganglia, malignant hypertension, left carotid artery stenosis, junctional bradycardia, demand Ischemia vs ACS, and AKI on CKD IIIa.    PT Comments    Patient making progress towards physical therapy goals. Daughter in room on arrival and reporting patient being able to ambulate to/from bathroom x 2-3 this date. Patient reporting fatigue but agreeable to therapy session. Able to complete bed mobility at supervision level and sit to stand with min guard for safety. Ambulated 37' with min guard and RW with frequent cues for increasing step length on L and upright posture. Completed sit to stand x 5 from EOB. Encouraged patient and daughter for patient to sit up in recliner for all meals and continue ambulating to/from bathroom, both in agreement and written on board in room. Discharge plan remains appropriate.     Recommendations for follow up therapy are one component of a multi-disciplinary discharge planning process, led by the attending physician.  Recommendations may be updated based on patient status, additional functional criteria and insurance authorization.  Follow Up Recommendations  Can patient physically be transported by private vehicle: Yes    Assistance Recommended at Discharge Frequent or constant  Supervision/Assistance  Patient can return home with the following A little help with walking and/or transfers;A little help with bathing/dressing/bathroom;Assistance with cooking/housework;Direct supervision/assist for medications management;Help with stairs or ramp for entrance;Assist for transportation   Equipment Recommendations  Rolling Diamante Rubin (2 wheels);BSC/3in1    Recommendations for Other Services       Precautions / Restrictions Precautions Precautions: Fall Restrictions Weight Bearing Restrictions: No     Mobility  Bed Mobility Overal bed mobility: Needs Assistance Bed Mobility: Supine to Sit, Sit to Supine     Supine to sit: Supervision Sit to supine: Supervision   General bed mobility comments: supervision for safety. No physical assistance required this session    Transfers Overall transfer level: Needs assistance Equipment used: Rolling Dorrian Doggett (2 wheels) Transfers: Sit to/from Stand Sit to Stand: Min guard                Ambulation/Gait Ambulation/Gait assistance: Min guard Gait Distance (Feet): 60 Feet Assistive device: Rolling Merit Maybee (2 wheels) Gait Pattern/deviations: Step-to pattern, Decreased step length - left, Decreased weight shift to left Gait velocity: decreased     General Gait Details: cues for increasing step length on L and upright posture throughout. Min guard for safety.   Stairs             Wheelchair Mobility    Modified Rankin (Stroke Patients Only)       Balance Overall balance assessment: Needs assistance Sitting-balance support: No upper extremity supported, Feet supported Sitting balance-Leahy Scale: Good     Standing balance support: Bilateral upper extremity supported, During functional activity, Reliant on assistive device for balance Standing balance-Leahy Scale: Fair  Cognition Arousal/Alertness: Awake/alert Behavior During Therapy: WFL for tasks  assessed/performed Overall Cognitive Status: Impaired/Different from baseline                                 General Comments: Pt follows commands with increased time and supportive family in room.        Exercises Other Exercises Other Exercises: sit to stand x 5    General Comments        Pertinent Vitals/Pain Pain Assessment Pain Assessment: No/denies pain    Home Living                          Prior Function            PT Goals (current goals can now be found in the care plan section) Acute Rehab PT Goals PT Goal Formulation: With patient/family Time For Goal Achievement: 03/26/23 Potential to Achieve Goals: Good Progress towards PT goals: Progressing toward goals    Frequency    Min 4X/week      PT Plan Current plan remains appropriate    Co-evaluation              AM-PAC PT "6 Clicks" Mobility   Outcome Measure  Help needed turning from your back to your side while in a flat bed without using bedrails?: A Little Help needed moving from lying on your back to sitting on the side of a flat bed without using bedrails?: A Little Help needed moving to and from a bed to a chair (including a wheelchair)?: A Little Help needed standing up from a chair using your arms (e.g., wheelchair or bedside chair)?: A Little Help needed to walk in hospital room?: A Little Help needed climbing 3-5 steps with a railing? : A Lot 6 Click Score: 17    End of Session Equipment Utilized During Treatment: Gait belt Activity Tolerance: Patient tolerated treatment well Patient left: in bed;with call bell/phone within reach;with bed alarm set;with family/visitor present Nurse Communication: Mobility status PT Visit Diagnosis: Muscle weakness (generalized) (M62.81);Difficulty in walking, not elsewhere classified (R26.2)     Time: 1610-9604 PT Time Calculation (min) (ACUTE ONLY): 38 min  Charges:  $Gait Training: 8-22 mins $Therapeutic  Exercise: 8-22 mins                     Maylon Peppers, PT, DPT Physical Therapist - [facility]  [facility]    [name] [name] 03/16/2023, 2:38 PM

## 2023-03-16 NOTE — Progress Notes (Signed)
[facility]  ROUNDING NOTE   Subjective:   Patient seen sitting up in bed, daughter at bedside Social and smiling Appetite improving Room air  Creatinine 2.77 UOP 1L   Objective:  Vital signs in last 24 hours:  Temp:  [97.5 F (36.4 C)-98.7 F (37.1 C)] 97.9 F (36.6 C) (05/24 1133) Pulse Rate:  [50-71] 61 (05/24 1133) Resp:  [16-20] 20 (05/24 1133) BP: (152-199)/(36-63) 165/50 (05/24 1133) SpO2:  [94 %-98 %] 97 % (05/24 1133)  Weight change:  Filed Weights   03/08/23 0626 03/08/23 0841  Weight: 87.5 kg 88.1 kg    Intake/Output: I/O last 3 completed shifts: In: 350 [P.O.:350] Out: 1010 [Urine:1010]   Intake/Output this shift:  No intake/output data recorded.  Physical Exam: General: NAD, sitting up in bed  Head: Normocephalic, atraumatic. Moist oral mucosal membranes  Eyes: Anicteric  Lungs:  Clear on auscultation   Heart: Regular rate and rhythm  Abdomen:  Soft, nontender  Extremities:  No peripheral edema.  Neurologic: Alert and oriented, moving all four extremities  Skin: No lesions  Access: None    Basic Metabolic Panel: Recent Labs  Lab 03/11/23 0417 03/12/23 0507 03/13/23 0409 03/14/23 0519 03/15/23 0654 03/16/23 0540  NA 135 134* 132* 133* 133* 134*  K 3.7 3.7 3.9 4.3 4.1 4.0  CL 104 104 102 101 101 103  CO2 23 21* 22 23 21* 20*  GLUCOSE 128* 124* 123* 156* 123* 124*  BUN 61* 65* 73* 77* 83* 94*  CREATININE 2.64* 3.37* 3.46* 2.54* 2.92* 2.77*  CALCIUM 8.2* 7.8* 7.6* 8.2* 7.9* 8.2*  MG 2.2 2.1 2.1 2.2 2.2  --   PHOS  --  4.6 5.6* 4.4  --   --      Liver Function Tests: Recent Labs  Lab 03/12/23 0507  ALBUMIN 2.7*    No results for input(s): "LIPASE", "AMYLASE" in the last 168 hours. No results for input(s): "AMMONIA" in the last 168 hours.  CBC: Recent Labs  Lab 03/11/23 0417 03/12/23 0855 03/13/23 0409 03/14/23 0519  WBC 17.7* 12.8* 12.3* 20.3*  NEUTROABS  --   --   --  18.1*  HGB 11.5* 11.2* 9.8* 11.0*   HCT 34.3* 34.5* 30.2* 32.9*  MCV 92.2 93.8 94.7 92.7  PLT 190 187 173 217     Cardiac Enzymes: No results for input(s): "CKTOTAL", "CKMB", "CKMBINDEX", "TROPONINI" in the last 168 hours.  BNP: Invalid input(s): "POCBNP"  CBG: Recent Labs  Lab 03/15/23 1204 03/15/23 1600 03/15/23 2121 03/16/23 0821 03/16/23 1135  GLUCAP 124* 142* 154* 125* 149*     Microbiology: Results for orders placed or performed during the hospital encounter of 03/08/23  Urine Culture (for pregnant, neutropenic or urologic patients or patients with an indwelling urinary catheter)     Status: Abnormal   Collection Time: 03/12/23  8:11 AM   Specimen: Urine, Random  Result Value Ref Range Status   Specimen Description   Final    URINE, RANDOM Performed at [facility], [address] [place], [place] [address]    Special Requests   Final    NONE Performed at [facility], [address] [place], [place] [address]    Culture (A)  Final    <10,000 COLONIES/mL INSIGNIFICANT GROWTH Performed at [facility], [address] [place], [place] [address]    Report Status 03/13/2023 FINAL  Final    Coagulation Studies: No results for input(s): "LABPROT", "INR" in the last 72 hours.  Urinalysis: No results for input(s): "COLORURINE", "LABSPEC", "PHURINE", "GLUCOSEU", "HGBUR", "BILIRUBINUR", "KETONESUR", "PROTEINUR", "UROBILINOGEN", "NITRITE", "LEUKOCYTESUR" in the last 72 hours.  Invalid input(s): "APPERANCEUR"     Imaging: No results found.   Medications:    sodium chloride Stopped (03/09/23 1239)    amLODipine  10 mg Oral Daily   aspirin EC  81 mg Oral Daily   Chlorhexidine Gluconate Cloth  6 each Topical Daily   cloNIDine  0.1 mg Transdermal Weekly   [START ON 03/17/2023] cloNIDine  0.1 mg Oral Daily   docusate sodium  100 mg Oral BID   fluticasone  1 spray Each Nare Daily   heparin  5,000 Units Subcutaneous Q8H   hydrALAZINE  10 mg Oral Q6H   insulin  aspart  0-5 Units Subcutaneous QHS   insulin aspart  0-9 Units Subcutaneous TID WC   loratadine  10 mg Oral Daily   pantoprazole  40 mg Oral Daily   pravastatin  40 mg Oral q1800   senna  1 tablet Oral Daily   venlafaxine XR  75 mg Oral QHS   acetaminophen **OR** acetaminophen, guaiFENesin-dextromethorphan, hydrALAZINE, melatonin, ondansetron, phenol, polyethylene glycol  Assessment/ Plan:  Karina Leonard is a 82 y.o.  female  with hypertension, peripheral vascular disease, carotid artery stenosis, coronary artery disease, atrial fibrillation, diastolic congestive heart failure and TIA, who was admitted to [facility] on 03/08/2023 for Malignant hypertension [I10]   Acute Kidney Injury on chronic kidney disease stage IIIA: baseline creatinine of 1.17, GFR of 47 on 02/27/23. Acute kidney injury secondary to hypertensive emergency. Urinalysis with proteinuria and hematuria from 2023. No IV contrast exposure. No signs of obstruction.  -Creatinine slightly improved -Adequate UOP  - Will continue to monitor renal function    Lab Results  Component Value Date   CREATININE 2.77 (H) 03/16/2023   CREATININE 2.92 (H) 03/15/2023   CREATININE 2.54 (H) 03/14/2023    Intake/Output Summary (Last 24 hours) at 03/16/2023 1501 Last data filed at 03/16/2023 0400 Gross per 24 hour  Intake --  Output 200 ml  Net -200 ml     Hypertension: with hypertensive emergency on admission. Placed on clevedipine gtt. Secondary work up shows no renal artery stenosis and normal thyroid function panel. Echocardiogram from 01/08/23 reviewed.  - Home regimen of clonidine, furosemide, losartan, hydrochlorothiazide and metoprolol which have all been reordered.  Amlodipine added this admission -Continue holding hydrochlorothiazide, furosemide and losartan  - Plasma renin activity, aldosterone,  and metanephrines pending. Catecholamines negative, cortisol 7.4 -Tapering oral Clonidine     LOS: 8    5/24/20243:01 PM

## 2023-03-16 NOTE — Progress Notes (Signed)
[facility]  Daily Progress Note   Subjective  -   Patient feeling better.  Looking forward to getting out of the hospital.  Seems to be making good urine.  Objective Vitals:   03/16/23 0026 03/16/23 0400 03/16/23 0523 03/16/23 0817  BP: (!) 163/44 (!) 153/47 (!) 152/55 (!) 152/63  Pulse: (!) 55 (!) 50 64 71  Resp:  16 19 18   Temp:  (!) 97.5 F (36.4 C) 98.7 F (37.1 C) 98 F (36.7 C)  TempSrc:      SpO2:  98% 95% 96%  Weight:      Height:        Intake/Output Summary (Last 24 hours) at 03/16/2023 0927 Last data filed at 03/16/2023 0400 Gross per 24 hour  Intake 190 ml  Output 1010 ml  Net -820 ml    PULM  CTAB CV  RRR   Laboratory CBC    Component Value Date/Time   WBC 20.3 (H) 03/14/2023 0519   HGB 11.0 (L) 03/14/2023 0519   HGB 12.5 01/31/2022 1054   HCT 32.9 (L) 03/14/2023 0519   HCT 37.8 01/31/2022 1054   PLT 217 03/14/2023 0519   PLT 214 01/31/2022 1054    BMET    Component Value Date/Time   NA 134 (L) 03/16/2023 0540   NA 143 02/02/2023 1114   K 4.0 03/16/2023 0540   CL 103 03/16/2023 0540   CO2 20 (L) 03/16/2023 0540   GLUCOSE 124 (H) 03/16/2023 0540   BUN 94 (H) 03/16/2023 0540   BUN 16 02/02/2023 1114   CREATININE 2.77 (H) 03/16/2023 0540   CALCIUM 8.2 (L) 03/16/2023 0540   GFRNONAA 17 (L) 03/16/2023 0540   GFRAA 60 01/22/2020 1140    Assessment/Planning:   Bilateral carotid stenosis.  Was planning carotid endarterectomy last week but had to be put on hold due to hypertensive crisis.  Found to have recent strokes so would wait a couple of weeks before considering any sort of intervention.  We can follow-up in the office in 1 to 2 weeks to discuss. Acute kidney injury on top of chronic kidney disease.  Creatinine seems to be slightly down today and she is making good urine but she has not yet returned to her baseline. Hypertensive crisis.  Runs fairly high blood pressures at a baseline but these seem to be  relatively stable. Patient is working towards discharge likely to rehab for short-term improvement.  I will plan to see her back in the office in 2 weeks to discuss her vascular issues.  Also we will plan a formal renal duplex since she had a very limited study done here in the hospital.   Festus Barren  03/16/2023, 9:27 AM

## 2023-03-16 NOTE — Progress Notes (Signed)
Occupational Therapy Treatment Patient Details Name: Karina Leonard MRN: [id number] DOB: 08/14/1941 Today's Date: 03/16/2023   History of present illness Pt is an 82 yo female with PMH that includes carotid artery stenosis, A-fib with slow ventricular response, bradycardia, CAD, DDD, depression, diastolic dysfunction, lipoma, HTN, pre-diabetes, TIA, and sciatica who presented for an elective left carotid endarterectomy per vascular surgery on 05/16.  However, procedure canceled due to severe hypertension sbp 200's.  Pt subsequently admitted to the stepdown. MD assessment includes: multiple bilateral small acute infarcts with largest lesions in the R cerebellum, L temporal lobe, and L basal ganglia, malignant hypertension, left carotid artery stenosis, junctional bradycardia, demand Ischemia vs ACS, and AKI on CKD IIIa.   OT comments  Upon entering the room, pt supine in bed and eating breakfast with supportive daughter present in room. Pt performing bed mobility with supervision. OT assisted pt with donning B socks and pt stands with min A and ambulates with RW with min guard for side steps and then to recliner chair. Pt requesting to finishing breakfast items. She needed assistance to open container but feeds self. Pt continues to benefit from OT intervention.    Recommendations for follow up therapy are one component of a multi-disciplinary discharge planning process, led by the attending physician.  Recommendations may be updated based on patient status, additional functional criteria and insurance authorization.    Assistance Recommended at Discharge Frequent or constant Supervision/Assistance  Patient can return home with the following  A little help with walking and/or transfers;A little help with bathing/dressing/bathroom;Assistance with cooking/housework;Assist for transportation;Help with stairs or ramp for entrance;Direct supervision/assist for financial management;Direct supervision/assist  for medications management   Equipment Recommendations  None recommended by OT       Precautions / Restrictions Precautions Precautions: Fall       Mobility Bed Mobility Overal bed mobility: Needs Assistance Bed Mobility: Supine to Sit     Supine to sit: Supervision     General bed mobility comments: no physical assistance needed this session    Transfers Overall transfer level: Needs assistance Equipment used: Rolling walker (2 wheels) Transfers: Sit to/from Stand Sit to Stand: Min guard                 Balance Overall balance assessment: Needs assistance Sitting-balance support: No upper extremity supported, Feet supported Sitting balance-Leahy Scale: Good     Standing balance support: Bilateral upper extremity supported, During functional activity, Reliant on assistive device for balance Standing balance-Leahy Scale: Fair                             ADL either performed or assessed with clinical judgement   ADL Overall ADL's : Needs assistance/impaired Eating/Feeding: Set up;Sitting Eating/Feeding Details (indicate cue type and reason): some assistance to open containers to fruit cup and pt feeds herself                                         Vision Patient Visual Report: No change from baseline            Cognition Arousal/Alertness: Awake/alert Behavior During Therapy: WFL for tasks assessed/performed Overall Cognitive Status: Impaired/Different from baseline  General Comments: Pt follows commands with increased time and supportive family in room.                   Pertinent Vitals/ Pain       Pain Assessment Pain Assessment: No/denies pain         Frequency  Min 3X/week        Progress Toward Goals  OT Goals(current goals can now be found in the care plan section)  Progress towards OT goals: Progressing toward goals     Plan Frequency remains  appropriate;Discharge plan remains appropriate       AM-PAC OT "6 Clicks" Daily Activity     Outcome Measure   Help from another person eating meals?: None Help from another person taking care of personal grooming?: None Help from another person toileting, which includes using toliet, bedpan, or urinal?: A Little Help from another person bathing (including washing, rinsing, drying)?: A Little Help from another person to put on and taking off regular upper body clothing?: A Little Help from another person to put on and taking off regular lower body clothing?: A Little 6 Click Score: 20    End of Session Equipment Utilized During Treatment: Rolling walker (2 wheels)  OT Visit Diagnosis: Unsteadiness on feet (R26.81);Repeated falls (R29.6);Muscle weakness (generalized) (M62.81)   Activity Tolerance Patient tolerated treatment well   Patient Left in chair;with call bell/phone within reach;with family/visitor present   Nurse Communication Mobility status        Time: 1610-9604 OT Time Calculation (min): 18 min  Charges: OT General Charges $OT Visit: 1 Visit OT Treatments $Therapeutic Activity: 8-22 mins  Jackquline Denmark, MS, OTR/L , CBIS ascom [phone]  03/16/23, 12:35 PM

## 2023-03-16 NOTE — Care Management Important Message (Signed)
Important Message  Patient Details  Name: Karina Leonard MRN: [id number] Date of Birth: 1941/08/13   Medicare Important Message Given:  Yes     [name] 03/16/2023, 11:53 AM

## 2023-03-16 NOTE — TOC Progression Note (Addendum)
Transition of Care [facility]) - Progression Note    Patient Details  Name: NEYA NYHOLM MRN: [id number] Date of Birth: 1941/10/06  [facility] CM/SW Contact  Margarito Liner, LCSW Phone Number: 03/16/2023, 9:52 AM  Clinical Narrative:   Uploaded clinicals into [place] Must for PASARR review. SNF bed offers pending.  12:13 pm: PASARR obtained: [id number]. Expires 6/23.  Expected Discharge Plan: Home w Home Health Services Barriers to Discharge: Continued Medical Work up  Expected Discharge Plan and Services       Living arrangements for the past 2 months: Single Family Home                                       Social Determinants of Health (SDOH) Interventions SDOH Screenings   Food Insecurity: No Food Insecurity (03/08/2023)  Housing: Low Risk  (03/08/2023)  Transportation Needs: No Transportation Needs (03/08/2023)  Utilities: Not At Risk (03/08/2023)  Alcohol Screen: Low Risk  (03/22/2022)  Depression (PHQ2-9): Low Risk  (02/02/2023)  Financial Resource Strain: Low Risk  (03/22/2022)  Physical Activity: Insufficiently Active (03/22/2022)  Social Connections: Moderately Isolated (03/22/2022)  Stress: No Stress Concern Present (03/22/2022)  Tobacco Use: Medium Risk (03/16/2023)    Readmission Risk Interventions     No data to display

## 2023-03-16 NOTE — Progress Notes (Addendum)
PROGRESS NOTE    Karina Leonard  ZOX:[id number] DOB: 08-May-1941 DOA: 03/08/2023 PCP: Duanne Limerick, MD  Outpatient Specialists: vascular surgery, cardiology    Brief Narrative:   This is an 82 yo female with carotid artery stenosis who presented for an elective left carotid endarterectomy per vascular surgery on 05/16. However, procedure canceled due to severe hypertension sbp 200's. Pt subsequently admitted to the stepdown unit per vascular surgery and nitroglycerin gtt initiated for bp control. Pt also restarted on her outpatient antihypertensives. However, she remained hypertensive and nicardipine gtt added. Pt later developed symptomatic hypotension sbp 90's to low 100's. According to pts daughters pts baseline sbp 140-160's. PCCM team consulted and levophed gtt initiated.  05/16: Pt initially admitted to the stepdown unit with hypertensive urgency and later developed symptomatic hypotension secondary to antihypertensive medication requiring levophed gtt PCCM team consulted to assist with management  05/18: Pt had another episode of accelerated hypertension we have reviewed            medications, cleviprex gtt started overnight. Patient seems to have developed hydralazine        tolerance with rebound effect. Have increased HCTZ to 50 from 25.  Have met with        cardiology - Dr Juliann Pares , amlodipine 5 bid has been initiated. Will consider additional        therapy as needed while weaning down cleviprex gtt.  Her Chronic renal impairment        seems to have acute component with oliguric aki, will get nephrology to evaluate.  05/19: Pt in no distress this am. BP still elevated and still requires cleviprex titration with the PO regimen. Nephrology restarted clonidine due to concern of rebound hypertension following discontinuation of outpatient clonidine patch  05/20: Cleviprex gtt currently off.  SBP improved significantly now 130-150.  Pt now with increasing troponin [phone]  Cardiology following and aware 05/20: MRA Head/Neck: multiple scattered bilateral small acute infarcts. The largest        lesions are located in the right cerebellum, left temporal lobe and left basal ganglia. No        hemorrhage or mass effect. Loss of flow related enhancement within the left vertebral        artery V4 segment, consistent with slow flow or occlusion. Signal loss of the distal right        common carotid artery and left internal carotid artery near the carotid bifurcation. This        may be exaggerated by motion artifact, but there is likely a component of stenosis or        occlusion. CTA of the neck may be helpful for further characterization. Multiple chronic        microhemorrhages in a predominantly central distribution, likely due to chronic        hypertensive microangiopathy. 05/21: Code stroke initiated on 05/20 due to new onset right-sided facial droop and RUE        weakness.  CT Head revealed new area of hypoattenuation in the left inferior temporal        gyrus, concerning for acute infarct. No acute intracranial hemorrhage. ASPECT score is 10.   Assessment & Plan:   Principal Problem:   Malignant hypertension Active Problems:   Coronary artery disease   Carotid stenosis   Diabetes mellitus without complication (HCC)   Acute CVA (cerebrovascular accident) (HCC)   AKI (acute kidney injury) (HCC)  # Hypertensive emergency # Chronic hypertension Now  with rebound hypertension after clonidine was discontinued. BPs improving but still elevated. Cardiology manages her htn as outpt. Developed symptomatic hypotension on nicardipine gtt. With widened pulse pressure - arterial wall stiffness likely confounds BPs to some extent. - amlodipine increased to 10 qd  - continue clonidine patch, we are tapering oral clonidine - adding hydralazine today - vascular advising further outpatient CTA to eval for RAS - home hctz, lasix, losartan on hold 2/2 aki - hyperaldo  and pheo labs drawn on 5/20 pending (normal catecholamines) - discussed w/ nephrology today  # CVA, acute Multiple. Neurology has signed off.  - continue aspirin/statin - cardiac monitor at discharge (already placed?) - endarterectomy planning as below - pt/ot advising SNF, TOC aware  # AKI on ckd 3a Baseline cr of 1.17, here peaked at 3s, today 2.92, uop 1 liter last 24. Nephrology following. They think it will take a while for kidney function to improve and will plan to follow outpt  # Carotid stenosis Vascular advising close outpatient f/u 1-2 weeks to make plans for endarterectomy - continue aspirin/plavix  # Demand ischemia vs nstemi Troponin peak in the 3000s. Currently asymptomatic. Cardiology has signed off, they are deferring additional diagnostics this hospitalization - cont asa/statin - close outpt f/u  # Constipation Had bm today with below bowel regimen -   miralax standing 34 g/day, senna    DVT prophylaxis: heparin Code Status: full Family Communication: daughter updated @ bedside  Level of care: Telemetry Medical Status is: Inpatient Remains inpatient appropriate because: ongoing bp mgmt    Consultants:  Nephrology, cardiology, vascular surgery, neurology  Procedures: none  Antimicrobials:  S/p cipro    Subjective: Feeling well, no complaints  Objective: Vitals:   03/16/23 0400 03/16/23 0523 03/16/23 0817 03/16/23 1133  BP: (!) 153/47 (!) 152/55 (!) 152/63 (!) 165/50  Pulse: (!) 50 64 71 61  Resp: 16 19 18 20   Temp: (!) 97.5 F (36.4 C) 98.7 F (37.1 C) 98 F (36.7 C) 97.9 F (36.6 C)  TempSrc:      SpO2: 98% 95% 96% 97%  Weight:      Height:        Intake/Output Summary (Last 24 hours) at 03/16/2023 1418 Last data filed at 03/16/2023 0400 Gross per 24 hour  Intake --  Output 200 ml  Net -200 ml   Filed Weights   03/08/23 0626 03/08/23 0841  Weight: 87.5 kg 88.1 kg    Examination:  General exam: Appears calm and  comfortable  Respiratory system: normal respiratory rate and work of breathing Cardiovascular system: RRR Gastrointestinal system: deferred Central nervous system: Alert and oriented. No focal neurological deficits. Extremities: warm, no edema Skin: No visible rashes, lesions or ulcers Psychiatry: Judgement and insight appear normal. Mood & affect appropriate.     Data Reviewed: I have personally reviewed following labs and imaging studies  CBC: Recent Labs  Lab 03/11/23 0417 03/12/23 0855 03/13/23 0409 03/14/23 0519  WBC 17.7* 12.8* 12.3* 20.3*  NEUTROABS  --   --   --  18.1*  HGB 11.5* 11.2* 9.8* 11.0*  HCT 34.3* 34.5* 30.2* 32.9*  MCV 92.2 93.8 94.7 92.7  PLT 190 187 173 217   Basic Metabolic Panel: Recent Labs  Lab 03/11/23 0417 03/12/23 0507 03/13/23 0409 03/14/23 0519 03/15/23 0654 03/16/23 0540  NA 135 134* 132* 133* 133* 134*  K 3.7 3.7 3.9 4.3 4.1 4.0  CL 104 104 102 101 101 103  CO2 23 21* 22 23 21*  20*  GLUCOSE 128* 124* 123* 156* 123* 124*  BUN 61* 65* 73* 77* 83* 94*  CREATININE 2.64* 3.37* 3.46* 2.54* 2.92* 2.77*  CALCIUM 8.2* 7.8* 7.6* 8.2* 7.9* 8.2*  MG 2.2 2.1 2.1 2.2 2.2  --   PHOS  --  4.6 5.6* 4.4  --   --    GFR: Estimated Creatinine Clearance: 18.5 mL/min (A) (by C-G formula based on SCr of 2.77 mg/dL (H)). Liver Function Tests: Recent Labs  Lab 03/12/23 0507  ALBUMIN 2.7*   No results for input(s): "LIPASE", "AMYLASE" in the last 168 hours. No results for input(s): "AMMONIA" in the last 168 hours. Coagulation Profile: No results for input(s): "INR", "PROTIME" in the last 168 hours. Cardiac Enzymes: No results for input(s): "CKTOTAL", "CKMB", "CKMBINDEX", "TROPONINI" in the last 168 hours. BNP (last 3 results) No results for input(s): "PROBNP" in the last 8760 hours. HbA1C: No results for input(s): "HGBA1C" in the last 72 hours. CBG: Recent Labs  Lab 03/15/23 1204 03/15/23 1600 03/15/23 2121 03/16/23 0821 03/16/23 1135   GLUCAP 124* 142* 154* 125* 149*   Lipid Profile: No results for input(s): "CHOL", "HDL", "LDLCALC", "TRIG", "CHOLHDL", "LDLDIRECT" in the last 72 hours.  Thyroid Function Tests: No results for input(s): "TSH", "T4TOTAL", "FREET4", "T3FREE", "THYROIDAB" in the last 72 hours. Anemia Panel: No results for input(s): "VITAMINB12", "FOLATE", "FERRITIN", "TIBC", "IRON", "RETICCTPCT" in the last 72 hours. Urine analysis:    Component Value Date/Time   COLORURINE YELLOW (A) 03/11/2023 1637   APPEARANCEUR HAZY (A) 03/11/2023 1637   LABSPEC 1.011 03/11/2023 1637   PHURINE 5.0 03/11/2023 1637   GLUCOSEU NEGATIVE 03/11/2023 1637   HGBUR SMALL (A) 03/11/2023 1637   BILIRUBINUR NEGATIVE 03/11/2023 1637   BILIRUBINUR negative 01/21/2020 1202   KETONESUR NEGATIVE 03/11/2023 1637   PROTEINUR 30 (A) 03/11/2023 1637   UROBILINOGEN 0.2 01/21/2020 1202   NITRITE NEGATIVE 03/11/2023 1637   LEUKOCYTESUR LARGE (A) 03/11/2023 1637   Sepsis Labs: @LABRCNTIP (procalcitonin:4,lacticidven:4)  ) Recent Results (from the past 240 hour(s))  Urine Culture (for pregnant, neutropenic or urologic patients or patients with an indwelling urinary catheter)     Status: Abnormal   Collection Time: 03/12/23  8:11 AM   Specimen: Urine, Random  Result Value Ref Range Status   Specimen Description   Final    URINE, RANDOM Performed at [facility], [address] [place], [place] [address]    Special Requests   Final    NONE Performed at [facility], [address], [place], [place] [address]    Culture (A)  Final    <10,000 COLONIES/mL INSIGNIFICANT GROWTH Performed at [facility], [address] [place], [place] [address]    Report Status 03/13/2023 FINAL  Final         Radiology Studies: No results found.      Scheduled Meds:  amLODipine  10 mg Oral Daily   aspirin EC  81 mg Oral Daily   Chlorhexidine Gluconate Cloth  6 each Topical Daily   cloNIDine  0.1 mg  Transdermal Weekly   [START ON 03/17/2023] cloNIDine  0.1 mg Oral Daily   docusate sodium  100 mg Oral BID   fluticasone  1 spray Each Nare Daily   heparin  5,000 Units Subcutaneous Q8H   hydrALAZINE  10 mg Oral Q6H   insulin aspart  0-5 Units Subcutaneous QHS   insulin aspart  0-9 Units Subcutaneous TID WC   loratadine  10 mg Oral Daily  pantoprazole  40 mg Oral Daily   pravastatin  40 mg Oral q1800   senna  1 tablet Oral Daily   venlafaxine XR  75 mg Oral QHS   Continuous Infusions:  sodium chloride Stopped (03/09/23 1239)     LOS: 8 days     Silvano Bilis, MD [facility]   If 7PM-7AM, please contact night-coverage [email or web address] Password [email or web address] 03/16/2023, 2:18 PM

## 2023-03-17 DIAGNOSIS — I10 Essential (primary) hypertension: Secondary | ICD-10-CM | POA: Diagnosis not present

## 2023-03-17 LAB — BASIC METABOLIC PANEL
Anion gap: 10 (ref 5–15)
BUN: 91 mg/dL — ABNORMAL HIGH (ref 8–23)
CO2: 22 mmol/L (ref 22–32)
Calcium: 8.4 mg/dL — ABNORMAL LOW (ref 8.9–10.3)
Chloride: 104 mmol/L (ref 98–111)
Creatinine, Ser: 2.25 mg/dL — ABNORMAL HIGH (ref 0.44–1.00)
GFR, Estimated: 22 mL/min — ABNORMAL LOW (ref >60)
Glucose, Bld: 119 mg/dL — ABNORMAL HIGH (ref 70–99)
Potassium: 3.6 mmol/L (ref 3.5–5.1)
Sodium: 136 mmol/L (ref 135–145)

## 2023-03-17 LAB — GLUCOSE, CAPILLARY
Glucose-Capillary: 108 mg/dL — ABNORMAL HIGH (ref 70–99)
Glucose-Capillary: 119 mg/dL — ABNORMAL HIGH (ref 70–99)
Glucose-Capillary: 121 mg/dL — ABNORMAL HIGH (ref 70–99)
Glucose-Capillary: 124 mg/dL — ABNORMAL HIGH (ref 70–99)
Glucose-Capillary: 145 mg/dL — ABNORMAL HIGH (ref 70–99)
Glucose-Capillary: 150 mg/dL — ABNORMAL HIGH (ref 70–99)

## 2023-03-17 MED ORDER — HYDRALAZINE HCL 50 MG PO TABS
25.0000 mg | ORAL_TABLET | Freq: Four times a day (QID) | ORAL | Status: DC
  Administered 2023-03-17 – 2023-03-18 (×3): 25 mg via ORAL
  Filled 2023-03-17 (×3): qty 1

## 2023-03-17 MED ORDER — CLONIDINE HCL 0.2 MG/24HR TD PTWK
0.2000 mg | MEDICATED_PATCH | TRANSDERMAL | Status: DC
  Administered 2023-03-17: 0.2 mg via TRANSDERMAL
  Filled 2023-03-17: qty 1

## 2023-03-17 NOTE — Progress Notes (Signed)
OT Cancellation Note  Patient Details Name: Karina Leonard MRN: [id number] DOB: 1941-06-21   Cancelled Treatment:    Reason Eval/Treat Not Completed: Other (comment). Pt politely deferred working with therapy at this time. Currently visiting with family. Will re-attempt at later date/time.   Gerrie Nordmann 03/17/2023, 4:22 PM

## 2023-03-17 NOTE — Progress Notes (Signed)
[facility]  ROUNDING NOTE   Subjective:   Patient seen resting in bed Alert and oriented No family at bedside Reports appropriate appetite  Blood pressure remains elevated  Creatinine 2.25   Objective:  Vital signs in last 24 hours:  Temp:  [97.7 F (36.5 C)-98.4 F (36.9 C)] 98.1 F (36.7 C) (05/25 1153) Pulse Rate:  [50-60] 56 (05/25 1200) Resp:  [16-19] 16 (05/25 1153) BP: (160-177)/(52-80) 166/52 (05/25 1153) SpO2:  [96 %-99 %] 96 % (05/25 1153)  Weight change:  Filed Weights   03/08/23 0626 03/08/23 0841  Weight: 87.5 kg 88.1 kg    Intake/Output: I/O last 3 completed shifts: In: -  Out: 200 [Urine:200]   Intake/Output this shift:  Total I/O In: 120 [P.O.:120] Out: -   Physical Exam: General: NAD  Head: Normocephalic, atraumatic. Moist oral mucosal membranes  Eyes: Anicteric  Lungs:  Clear on auscultation, room air  Heart: Regular rate and rhythm  Abdomen:  Soft, nontender  Extremities:  No peripheral edema.  Neurologic: Alert and oriented, moving all four extremities  Skin: No lesions  Access: None    Basic Metabolic Panel: Recent Labs  Lab 03/11/23 0417 03/12/23 0507 03/13/23 0409 03/14/23 0519 03/15/23 0654 03/16/23 0540 03/17/23 0538  NA 135 134* 132* 133* 133* 134* 136  K 3.7 3.7 3.9 4.3 4.1 4.0 3.6  CL 104 104 102 101 101 103 104  CO2 23 21* 22 23 21* 20* 22  GLUCOSE 128* 124* 123* 156* 123* 124* 119*  BUN 61* 65* 73* 77* 83* 94* 91*  CREATININE 2.64* 3.37* 3.46* 2.54* 2.92* 2.77* 2.25*  CALCIUM 8.2* 7.8* 7.6* 8.2* 7.9* 8.2* 8.4*  MG 2.2 2.1 2.1 2.2 2.2  --   --   PHOS  --  4.6 5.6* 4.4  --   --   --      Liver Function Tests: Recent Labs  Lab 03/12/23 0507  ALBUMIN 2.7*    No results for input(s): "LIPASE", "AMYLASE" in the last 168 hours. No results for input(s): "AMMONIA" in the last 168 hours.  CBC: Recent Labs  Lab 03/11/23 0417 03/12/23 0855 03/13/23 0409 03/14/23 0519  WBC 17.7* 12.8* 12.3*  20.3*  NEUTROABS  --   --   --  18.1*  HGB 11.5* 11.2* 9.8* 11.0*  HCT 34.3* 34.5* 30.2* 32.9*  MCV 92.2 93.8 94.7 92.7  PLT 190 187 173 217     Cardiac Enzymes: No results for input(s): "CKTOTAL", "CKMB", "CKMBINDEX", "TROPONINI" in the last 168 hours.  BNP: Invalid input(s): "POCBNP"  CBG: Recent Labs  Lab 03/16/23 2056 03/16/23 2359 03/17/23 0449 03/17/23 0715 03/17/23 1156  GLUCAP 134* 119* 121* 108* 124*     Microbiology: Results for orders placed or performed during the hospital encounter of 03/08/23  Urine Culture (for pregnant, neutropenic or urologic patients or patients with an indwelling urinary catheter)     Status: Abnormal   Collection Time: 03/12/23  8:11 AM   Specimen: Urine, Random  Result Value Ref Range Status   Specimen Description   Final    URINE, RANDOM Performed at [facility], [address] [place], [place] [address]    Special Requests   Final    NONE Performed at [facility], [address], [place], [place] [address]    Culture (A)  Final    <10,000 COLONIES/mL INSIGNIFICANT GROWTH Performed at [facility], [address] [place], [place] [address]    Report Status 03/13/2023  FINAL  Final    Coagulation Studies: No results for input(s): "LABPROT", "INR" in the last 72 hours.  Urinalysis: No results for input(s): "COLORURINE", "LABSPEC", "PHURINE", "GLUCOSEU", "HGBUR", "BILIRUBINUR", "KETONESUR", "PROTEINUR", "UROBILINOGEN", "NITRITE", "LEUKOCYTESUR" in the last 72 hours.  Invalid input(s): "APPERANCEUR"     Imaging: No results found.   Medications:    sodium chloride Stopped (03/09/23 1239)    amLODipine  10 mg Oral Daily   aspirin EC  81 mg Oral Daily   Chlorhexidine Gluconate Cloth  6 each Topical Daily   cloNIDine  0.2 mg Transdermal Weekly   cloNIDine  0.1 mg Oral Daily   docusate sodium  100 mg Oral BID   fluticasone  1 spray Each Nare Daily   heparin  5,000 Units Subcutaneous Q8H    hydrALAZINE  25 mg Oral Q6H   insulin aspart  0-5 Units Subcutaneous QHS   insulin aspart  0-9 Units Subcutaneous TID WC   loratadine  10 mg Oral Daily   pantoprazole  40 mg Oral Daily   pravastatin  40 mg Oral q1800   senna  1 tablet Oral Daily   venlafaxine XR  75 mg Oral QHS   acetaminophen **OR** acetaminophen, guaiFENesin-dextromethorphan, hydrALAZINE, melatonin, ondansetron, phenol, polyethylene glycol  Assessment/ Plan:  [name] is a 82 y.o.  female  with hypertension, peripheral vascular disease, carotid artery stenosis, coronary artery disease, atrial fibrillation, diastolic congestive heart failure and TIA, who was admitted to [facility] on 03/08/2023 for Malignant hypertension [I10]   Acute Kidney Injury on chronic kidney disease stage IIIA: baseline creatinine of 1.17, GFR of 47 on 02/27/23. Acute kidney injury secondary to hypertensive emergency. Urinalysis with proteinuria and hematuria from 2023. No IV contrast exposure. No signs of obstruction.  -Creatinine improving.  - No acute need for dialysis - Continue to avoid nephrotoxic agents and therapies.    Lab Results  Component Value Date   CREATININE 2.25 (H) 03/17/2023   CREATININE 2.77 (H) 03/16/2023   CREATININE 2.92 (H) 03/15/2023    Intake/Output Summary (Last 24 hours) at 03/17/2023 1423 Last data filed at 03/17/2023 1046 Gross per 24 hour  Intake 120 ml  Output --  Net 120 ml     Hypertension: with hypertensive emergency on admission. Placed on clevedipine gtt. Secondary work up shows no renal artery stenosis and normal thyroid function panel. Echocardiogram from 01/08/23 reviewed.  - Home regimen of clonidine, furosemide, losartan, hydrochlorothiazide and metoprolol which have all been reordered.  Amlodipine added this admission -Continue holding hydrochlorothiazide, furosemide and losartan  - Plasma renin activity, aldosterone,  and metanephrines pending. Catecholamines negative, cortisol 7.4 -Blood  pressure remains elevated. Agree with increasing Clonidine to 0.2mg  patch. Continue tapering oral Clonidine.     LOS: 9   5/25/20242:23 PM

## 2023-03-17 NOTE — TOC Progression Note (Signed)
Transition of Care [facility]) - Progression Note    Patient Details  Name: Karina Leonard MRN: [id number] Date of Birth: Sep 02, 1941  Transition of Care [facility]) CM/SW Contact  Bing Quarry, RN Phone Number: 03/17/2023, 3:51 PM  Clinical Narrative:  03/17/23: Sherron Monday with patient regarding [facility] area STR/SNF preferences who deferred RN CM to [employer] (Daughter) at [phone]. Left confidential VM with Caprice Beaver with call back number for weekend. Gabriel Cirri RN CM [phone].       Expected Discharge Plan: Home w Home Health Services Barriers to Discharge: Continued Medical Work up  Expected Discharge Plan and Services       Living arrangements for the past 2 months: Single Family Home                                       Social Determinants of Health (SDOH) Interventions SDOH Screenings   Food Insecurity: No Food Insecurity (03/08/2023)  Housing: Low Risk  (03/08/2023)  Transportation Needs: No Transportation Needs (03/08/2023)  Utilities: Not At Risk (03/08/2023)  Alcohol Screen: Low Risk  (03/22/2022)  Depression (PHQ2-9): Low Risk  (02/02/2023)  Financial Resource Strain: Low Risk  (03/22/2022)  Physical Activity: Insufficiently Active (03/22/2022)  Social Connections: Moderately Isolated (03/22/2022)  Stress: No Stress Concern Present (03/22/2022)  Tobacco Use: Medium Risk (03/16/2023)    Readmission Risk Interventions     No data to display

## 2023-03-17 NOTE — Progress Notes (Signed)
PROGRESS NOTE    Karina Leonard  GNF:[id number] DOB: August 03, 1941 DOA: 03/08/2023 PCP: Duanne Limerick, MD  Outpatient Specialists: vascular surgery, cardiology    Brief Narrative:   This is an 82 yo female with carotid artery stenosis who presented for an elective left carotid endarterectomy per vascular surgery on 05/16. However, procedure canceled due to severe hypertension sbp 200's. Pt subsequently admitted to the stepdown unit per vascular surgery and nitroglycerin gtt initiated for bp control. Pt also restarted on her outpatient antihypertensives. However, she remained hypertensive and nicardipine gtt added. Pt later developed symptomatic hypotension sbp 90's to low 100's. According to pts daughters pts baseline sbp 140-160's. PCCM team consulted and levophed gtt initiated.  05/16: Pt initially admitted to the stepdown unit with hypertensive urgency and later developed symptomatic hypotension secondary to antihypertensive medication requiring levophed gtt PCCM team consulted to assist with management  05/18: Pt had another episode of accelerated hypertension we have reviewed            medications, cleviprex gtt started overnight. Patient seems to have developed hydralazine        tolerance with rebound effect. Have increased HCTZ to 50 from 25.  Have met with        cardiology - Dr Juliann Pares , amlodipine 5 bid has been initiated. Will consider additional        therapy as needed while weaning down cleviprex gtt.  Her Chronic renal impairment        seems to have acute component with oliguric aki, will get nephrology to evaluate.  05/19: Pt in no distress this am. BP still elevated and still requires cleviprex titration with the PO regimen. Nephrology restarted clonidine due to concern of rebound hypertension following discontinuation of outpatient clonidine patch  05/20: Cleviprex gtt currently off.  SBP improved significantly now 130-150.  Pt now with increasing troponin [phone]  Cardiology following and aware 05/20: MRA Head/Neck: multiple scattered bilateral small acute infarcts. The largest        lesions are located in the right cerebellum, left temporal lobe and left basal ganglia. No        hemorrhage or mass effect. Loss of flow related enhancement within the left vertebral        artery V4 segment, consistent with slow flow or occlusion. Signal loss of the distal right        common carotid artery and left internal carotid artery near the carotid bifurcation. This        may be exaggerated by motion artifact, but there is likely a component of stenosis or        occlusion. CTA of the neck may be helpful for further characterization. Multiple chronic        microhemorrhages in a predominantly central distribution, likely due to chronic        hypertensive microangiopathy. 05/21: Code stroke initiated on 05/20 due to new onset right-sided facial droop and RUE        weakness.  CT Head revealed new area of hypoattenuation in the left inferior temporal        gyrus, concerning for acute infarct. No acute intracranial hemorrhage. ASPECT score is 10.   Assessment & Plan:   Principal Problem:   Malignant hypertension Active Problems:   Coronary artery disease   Carotid stenosis   Diabetes mellitus without complication (HCC)   Acute CVA (cerebrovascular accident) (HCC)   AKI (acute kidney injury) (HCC)  # Hypertensive emergency # Chronic hypertension Now  with rebound hypertension after clonidine was discontinued. BPs improving but still elevated. Cardiology manages her htn as outpt. Developed symptomatic hypotension on nicardipine gtt. With widened pulse pressure - arterial wall stiffness likely confounds BPs to some extent. Systolics averaging in the 160s last 24, goal is 150s or lower. Hyperaldo labs negative, normal catecholamine - amlodipine 10 - continue clonidine patch but will increase to home dose 0.2, we are tapering oral clonidine - added hydral 10 on  5/24  - vascular advising further outpatient CTA to eval for RAS - home hctz, lasix, losartan on hold 2/2 aki - f/u metanephrines  # Debility TOC working on SNF  # CVA, acute Multiple. Neurology has signed off.  - continue aspirin/statin - cardiac monitor at discharge (already placed?) - endarterectomy planning as below - pt/ot advising SNF, TOC aware  # AKI on ckd 3a Baseline cr of 1.17, here peaked at 3s, today 2.92, uop 1 liter last 24. Nephrology following. They think it will take a while for kidney function to improve and will plan to follow outpt  # Carotid stenosis Vascular advising close outpatient f/u 1-2 weeks to make plans for endarterectomy - continue aspirin/plavix  # Demand ischemia vs nstemi Troponin peak in the 3000s. Currently asymptomatic. Cardiology has signed off, they are deferring additional diagnostics this hospitalization - cont asa/statin - close outpt f/u  # Constipation Resolved -   miralax standing 34 g/day, senna    DVT prophylaxis: heparin Code Status: full Family Communication: daughter updated @ bedside  Level of care: Telemetry Medical Status is: Inpatient Remains inpatient appropriate because: ongoing bp mgmt    Consultants:  Nephrology, cardiology, vascular surgery, neurology  Procedures: none  Antimicrobials:  S/p cipro    Subjective: Feeling well, no complaints, tolerating diet  Objective: Vitals:   03/17/23 0800 03/17/23 0808 03/17/23 1153 03/17/23 1200  BP: (!) 167/57 (!) 160/70 (!) 166/52   Pulse: (!) 50 (!) 58 (!) 50 (!) 56  Resp: 18  16   Temp: 98.2 F (36.8 C)  98.1 F (36.7 C)   TempSrc:   Oral   SpO2: 99%  96%   Weight:      Height:        Intake/Output Summary (Last 24 hours) at 03/17/2023 1409 Last data filed at 03/17/2023 1046 Gross per 24 hour  Intake 120 ml  Output --  Net 120 ml   Filed Weights   03/08/23 0626 03/08/23 0841  Weight: 87.5 kg 88.1 kg    Examination:  General exam:  Appears calm and comfortable  Respiratory system: normal respiratory rate and work of breathing Cardiovascular system: RRR Gastrointestinal system: deferred Central nervous system: Alert and oriented. No focal neurological deficits. Extremities: warm, no edema Skin: No visible rashes, lesions or ulcers Psychiatry: Judgement and insight appear normal. Mood & affect appropriate.     Data Reviewed: I have personally reviewed following labs and imaging studies  CBC: Recent Labs  Lab 03/11/23 0417 03/12/23 0855 03/13/23 0409 03/14/23 0519  WBC 17.7* 12.8* 12.3* 20.3*  NEUTROABS  --   --   --  18.1*  HGB 11.5* 11.2* 9.8* 11.0*  HCT 34.3* 34.5* 30.2* 32.9*  MCV 92.2 93.8 94.7 92.7  PLT 190 187 173 217   Basic Metabolic Panel: Recent Labs  Lab 03/11/23 0417 03/12/23 0507 03/13/23 0409 03/14/23 0519 03/15/23 0654 03/16/23 0540 03/17/23 0538  NA 135 134* 132* 133* 133* 134* 136  K 3.7 3.7 3.9 4.3 4.1 4.0 3.6  CL 104  104 102 101 101 103 104  CO2 23 21* 22 23 21* 20* 22  GLUCOSE 128* 124* 123* 156* 123* 124* 119*  BUN 61* 65* 73* 77* 83* 94* 91*  CREATININE 2.64* 3.37* 3.46* 2.54* 2.92* 2.77* 2.25*  CALCIUM 8.2* 7.8* 7.6* 8.2* 7.9* 8.2* 8.4*  MG 2.2 2.1 2.1 2.2 2.2  --   --   PHOS  --  4.6 5.6* 4.4  --   --   --    GFR: Estimated Creatinine Clearance: 22.7 mL/min (A) (by C-G formula based on SCr of 2.25 mg/dL (H)). Liver Function Tests: Recent Labs  Lab 03/12/23 0507  ALBUMIN 2.7*   No results for input(s): "LIPASE", "AMYLASE" in the last 168 hours. No results for input(s): "AMMONIA" in the last 168 hours. Coagulation Profile: No results for input(s): "INR", "PROTIME" in the last 168 hours. Cardiac Enzymes: No results for input(s): "CKTOTAL", "CKMB", "CKMBINDEX", "TROPONINI" in the last 168 hours. BNP (last 3 results) No results for input(s): "PROBNP" in the last 8760 hours. HbA1C: No results for input(s): "HGBA1C" in the last 72 hours. CBG: Recent Labs  Lab  03/16/23 2056 03/16/23 2359 03/17/23 0449 03/17/23 0715 03/17/23 1156  GLUCAP 134* 119* 121* 108* 124*   Lipid Profile: No results for input(s): "CHOL", "HDL", "LDLCALC", "TRIG", "CHOLHDL", "LDLDIRECT" in the last 72 hours.  Thyroid Function Tests: No results for input(s): "TSH", "T4TOTAL", "FREET4", "T3FREE", "THYROIDAB" in the last 72 hours. Anemia Panel: No results for input(s): "VITAMINB12", "FOLATE", "FERRITIN", "TIBC", "IRON", "RETICCTPCT" in the last 72 hours. Urine analysis:    Component Value Date/Time   COLORURINE YELLOW (A) 03/11/2023 1637   APPEARANCEUR HAZY (A) 03/11/2023 1637   LABSPEC 1.011 03/11/2023 1637   PHURINE 5.0 03/11/2023 1637   GLUCOSEU NEGATIVE 03/11/2023 1637   HGBUR SMALL (A) 03/11/2023 1637   BILIRUBINUR NEGATIVE 03/11/2023 1637   BILIRUBINUR negative 01/21/2020 1202   KETONESUR NEGATIVE 03/11/2023 1637   PROTEINUR 30 (A) 03/11/2023 1637   UROBILINOGEN 0.2 01/21/2020 1202   NITRITE NEGATIVE 03/11/2023 1637   LEUKOCYTESUR LARGE (A) 03/11/2023 1637   Sepsis Labs: @LABRCNTIP (procalcitonin:4,lacticidven:4)  ) Recent Results (from the past 240 hour(s))  Urine Culture (for pregnant, neutropenic or urologic patients or patients with an indwelling urinary catheter)     Status: Abnormal   Collection Time: 03/12/23  8:11 AM   Specimen: Urine, Random  Result Value Ref Range Status   Specimen Description   Final    URINE, RANDOM Performed at [facility], [address], [place], [place] [address]    Special Requests   Final    NONE Performed at [facility], [address] [place], [place] [address]    Culture (A)  Final    <10,000 COLONIES/mL INSIGNIFICANT GROWTH Performed at [facility], [address], [place], [place] [address]    Report Status 03/13/2023 FINAL  Final         Radiology Studies: No results found.      Scheduled Meds:  amLODipine  10 mg Oral Daily   aspirin EC  81 mg Oral Daily    Chlorhexidine Gluconate Cloth  6 each Topical Daily   cloNIDine  0.1 mg Transdermal Weekly   cloNIDine  0.1 mg Oral Daily   docusate sodium  100 mg Oral BID   fluticasone  1 spray Each Nare Daily   heparin  5,000 Units Subcutaneous Q8H   hydrALAZINE  25 mg Oral Q6H   insulin aspart  0-5 Units Subcutaneous  QHS   insulin aspart  0-9 Units Subcutaneous TID WC   loratadine  10 mg Oral Daily   pantoprazole  40 mg Oral Daily   pravastatin  40 mg Oral q1800   senna  1 tablet Oral Daily   venlafaxine XR  75 mg Oral QHS   Continuous Infusions:  sodium chloride Stopped (03/09/23 1239)     LOS: 9 days     Silvano Bilis, MD [facility]   If 7PM-7AM, please contact night-coverage [email or web address] Password [email or web address] 03/17/2023, 2:09 PM

## 2023-03-18 DIAGNOSIS — I10 Essential (primary) hypertension: Secondary | ICD-10-CM | POA: Diagnosis not present

## 2023-03-18 LAB — BASIC METABOLIC PANEL
Anion gap: 8 (ref 5–15)
BUN: 79 mg/dL — ABNORMAL HIGH (ref 8–23)
CO2: 24 mmol/L (ref 22–32)
Calcium: 8.5 mg/dL — ABNORMAL LOW (ref 8.9–10.3)
Chloride: 107 mmol/L (ref 98–111)
Creatinine, Ser: 1.65 mg/dL — ABNORMAL HIGH (ref 0.44–1.00)
GFR, Estimated: 31 mL/min — ABNORMAL LOW (ref >60)
Glucose, Bld: 116 mg/dL — ABNORMAL HIGH (ref 70–99)
Potassium: 3.7 mmol/L (ref 3.5–5.1)
Sodium: 139 mmol/L (ref 135–145)

## 2023-03-18 LAB — GLUCOSE, CAPILLARY
Glucose-Capillary: 106 mg/dL — ABNORMAL HIGH (ref 70–99)
Glucose-Capillary: 103 mg/dL — ABNORMAL HIGH (ref 70–99)
Glucose-Capillary: 119 mg/dL — ABNORMAL HIGH (ref 70–99)
Glucose-Capillary: 100 mg/dL — ABNORMAL HIGH (ref 70–99)

## 2023-03-18 MED ORDER — DIPHENOXYLATE-ATROPINE 2.5-0.025 MG PO TABS
1.0000 | ORAL_TABLET | Freq: Four times a day (QID) | ORAL | Status: DC | PRN
  Administered 2023-03-18 – 2023-03-22 (×5): 1 via ORAL
  Filled 2023-03-18 (×5): qty 1

## 2023-03-18 MED ORDER — HYDRALAZINE HCL 50 MG PO TABS
50.0000 mg | ORAL_TABLET | Freq: Three times a day (TID) | ORAL | Status: DC
  Administered 2023-03-18 – 2023-03-21 (×10): 50 mg via ORAL
  Filled 2023-03-18 (×10): qty 1

## 2023-03-18 MED ORDER — WHITE PETROLATUM EX OINT
2.0000 | TOPICAL_OINTMENT | CUTANEOUS | Status: DC | PRN
  Administered 2023-03-18 (×2): 2 via TOPICAL
  Filled 2023-03-18 (×3): qty 10

## 2023-03-18 NOTE — Progress Notes (Signed)
Physical Therapy Treatment Patient Details Name: Karina Leonard MRN: [id number] DOB: 1941-06-28 Today's Date: 03/18/2023   History of Present Illness Pt is an 82 yo female with PMH that includes carotid artery stenosis, A-fib with slow ventricular response, bradycardia, CAD, DDD, depression, diastolic dysfunction, lipoma, HTN, pre-diabetes, TIA, and sciatica who presented for an elective left carotid endarterectomy per vascular surgery on 05/16.  However, procedure canceled due to severe hypertension sbp 200's.  Pt subsequently admitted to the stepdown. MD assessment includes: multiple bilateral small acute infarcts with largest lesions in the R cerebellum, L temporal lobe, and L basal ganglia, malignant hypertension, left carotid artery stenosis, junctional bradycardia, demand Ischemia vs ACS, and AKI on CKD IIIa.    PT Comments    Pt very pleasant and motivated with PT, she showed good effort and was able to ambulate ~100 ft but ultimately continues to display unsteadiness (b/l low grade buckling, UE/AD reliance) though increasing distance and confidence this date.  She was able to do some balance exercises but with definite need for UE support with any dynamic acts.  Continued with PT POC.    Recommendations for follow up therapy are one component of a multi-disciplinary discharge planning process, led by the attending physician.  Recommendations may be updated based on patient status, additional functional criteria and insurance authorization.  Follow Up Recommendations  Can patient physically be transported by private vehicle: Yes    Assistance Recommended at Discharge Frequent or constant Supervision/Assistance  Patient can return home with the following A little help with walking and/or transfers;A little help with bathing/dressing/bathroom;Assistance with cooking/housework;Direct supervision/assist for medications management;Help with stairs or ramp for entrance;Assist for transportation    Equipment Recommendations  Rolling walker (2 wheels);BSC/3in1    Recommendations for Other Services       Precautions / Restrictions Precautions Precautions: Fall Restrictions Weight Bearing Restrictions: No     Mobility  Bed Mobility Overal bed mobility: Needs Assistance Bed Mobility: Supine to Sit, Sit to Supine     Supine to sit: Supervision     General bed mobility comments: supervision for safety. No physical assistance required this session    Transfers Overall transfer level: Needs assistance Equipment used: Rolling walker (2 wheels) Transfers: Sit to/from Stand Sit to Stand: Min guard           General transfer comment: cues for hand placement. Min guard for safety. multiple sit to stand efforts, needed to heavily leverage back of legs on recliner while standing w/o UEs    Ambulation/Gait Ambulation/Gait assistance: Min assist Gait Distance (Feet): 100 Feet Assistive device: Rolling walker (2 wheels)         General Gait Details: Pt had one LOBs to the L and multiple small stagger steps t/o the effort.  Heavy use of the walker and reporting some fatigue at ~50 ft, though motivated to keep going, VSS.  She showed discoordant steppage with consistent lack of TKE/low grade buckling that did not cause LOBs but certainly slowed her gait and increased UE reliance.   Stairs             Wheelchair Mobility    Modified Rankin (Stroke Patients Only)       Balance Overall balance assessment: Needs assistance Sitting-balance support: No upper extremity supported, Feet supported Sitting balance-Leahy Scale: Good       Standing balance-Leahy Scale: Fair Standing balance comment: pt able to maintain static eyes closed (even mild perturbations) relatively well w/o UE support, definite need for UEs  with SLS, tandem standing/ambulation.  Pt displayed some general unsteadiness t/o entirety of ambulation with considerable walker reliance                             Cognition Arousal/Alertness: Awake/alert Behavior During Therapy: WFL for tasks assessed/performed Overall Cognitive Status: Impaired/Different from baseline                       Memory: Decreased short-term memory         General Comments: Pt follows commands with increased time and supportive family in room.        Exercises Other Exercises Other Exercises: 5xSTS (with UEs) 22.9 sec Other Exercises: balance exercises including: eyes closed static standing w/o UE, eyes closed mild pertubation challenges, SLS with HHA (decreased tolerance and stability on L), tandem ambulation with HHA    General Comments        Pertinent Vitals/Pain Pain Assessment Pain Assessment: No/denies pain    Home Living                          Prior Function            PT Goals (current goals can now be found in the care plan section) Progress towards PT goals: Progressing toward goals    Frequency    Min 4X/week      PT Plan Current plan remains appropriate    Co-evaluation              AM-PAC PT "6 Clicks" Mobility   Outcome Measure  Help needed turning from your back to your side while in a flat bed without using bedrails?: A Little Help needed moving from lying on your back to sitting on the side of a flat bed without using bedrails?: A Little Help needed moving to and from a bed to a chair (including a wheelchair)?: A Little Help needed standing up from a chair using your arms (e.g., wheelchair or bedside chair)?: A Little Help needed to walk in hospital room?: A Little Help needed climbing 3-5 steps with a railing? : A Lot 6 Click Score: 17    End of Session Equipment Utilized During Treatment: Gait belt Activity Tolerance: Patient tolerated treatment well Patient left: with chair alarm set;with call bell/phone within reach;with family/visitor present Nurse Communication: Mobility status PT Visit Diagnosis: Muscle  weakness (generalized) (M62.81);Difficulty in walking, not elsewhere classified (R26.2)     Time: 1610-9604 PT Time Calculation (min) (ACUTE ONLY): 25 min  Charges:  $Gait Training: 8-22 mins $Therapeutic Exercise: 8-22 mins                     Malachi Pro, DPT 03/18/2023, 11:24 AM

## 2023-03-18 NOTE — Progress Notes (Signed)
[facility]  - [place] [facility]   PATIENT NAME: Karina Leonard    MR#:  [id number]  DATE OF BIRTH:  June 09, 1941  SUBJECTIVE:   Patient's daughter at bedside. Overall improving. Appears to be in good spirits. Worked with PT earlier. Blood pressure related to better. Patient is not use oxygen however daughter mentioned oxygen drop during nighttime. Discussed with patient and daughter regarding overnight pulse oximetry. Patient also had couple diarrheal stools. She was constipated a few days earlier.  VITALS:  Blood pressure (!) 165/52, pulse 65, temperature (!) 97.5 F (36.4 C), temperature source Oral, resp. rate 16, height 5\' 7"  (1.702 m), weight 88.1 kg, SpO2 100 %.  PHYSICAL EXAMINATION:   GENERAL:  82 y.o.-year-old patient with no acute distress.  LUNGS: Normal breath sounds bilaterally, no wheezing CARDIOVASCULAR: S1, S2 normal. No murmur   ABDOMEN: Soft, nontender, nondistended. Bowel sounds present.  EXTREMITIES: No  edema b/l.    NEUROLOGIC: nonfocal  patient is alert and awake SKIN: No obvious rash, lesion, or ulcer.   LABORATORY PANEL:  CBC Recent Labs  Lab 03/14/23 0519  WBC 20.3*  HGB 11.0*  HCT 32.9*  PLT 217    Chemistries  Recent Labs  Lab 03/15/23 0654 03/16/23 0540 03/18/23 0424  NA 133*   < > 139  K 4.1   < > 3.7  CL 101   < > 107  CO2 21*   < > 24  GLUCOSE 123*   < > 116*  BUN 83*   < > 79*  CREATININE 2.92*   < > 1.65*  CALCIUM 7.9*   < > 8.5*  MG 2.2  --   --    < > = values in this interval not displayed.    Assessment and Plan  82 yo female with carotid artery stenosis who presented for an elective left carotid endarterectomy per vascular surgery on 05/16. However, procedure canceled due to severe hypertension sbp 200's. Pt subsequently admitted to the stepdown unit per vascular surgery and nitroglycerin gtt initiated for bp control. Pt also restarted on her outpatient antihypertensives. However, she remained  hypertensive and nicardipine gtt added. Pt later developed symptomatic hypotension sbp 90's to low 100's. According to pts daughters pts baseline sbp 140-160's. PCCM team consulted and levophed gtt initiated.  Patient now transferred out of ICU and blood pressure is relatively stable.   CVA, acute,Multiple. Neurology has signed off.  - continue aspirin/statin - endarterectomy planning as below per vascular surgery - pt/ot advising SNF, TOC aware   Hypertensive emergency  Chronic hypertension -- amlodipine, clonidine patch , hydralazine - vascular advising further outpatient CTA to eval for RAS - home hctz, lasix, losartan on hold 2/2 aki - f/u metanephrines   Debility TOC working on SNF   AKI on ckd 3a Baseline cr of 1.17 --peaked to 2.9--IVF--1.65   Known Carotid stenosis --Vascular advising close outpatient f/u 1-2 weeks to make plans for endarterectomy --continue aspirin/plavix    Demand ischemia vs nstemi --Troponin peak in the 3000s. Currently asymptomatic.  --Cardiology has signed off, they are deferring additional diagnostics this hospitalization - cont asa/statin   Constipation Resolved -   miralax, senna  Procedures: Family communication :dter and son-in-law at bedside Consults : neurology, vascular, cardiology CODE STATUS: full DVT Prophylaxis : heparin Level of care: Telemetry Medical Status is: Inpatient Remains inpatient appropriate because: patient is medically best at baseline. Awaiting discharge planning/rehab bed.    TOTAL TIME TAKING CARE OF THIS PATIENT:  35 minutes.  >50% time spent on counselling and coordination of care  Note: This dictation was prepared with Dragon dictation along with smaller phrase technology. Any transcriptional errors that result from this process are unintentional.  Enedina Finner M.D    [facility]   CC: Primary care physician; Duanne Limerick, MD

## 2023-03-18 NOTE — Progress Notes (Signed)
Mobility Specialist - Progress Note   03/18/23 1041  Mobility  Activity Ambulated with assistance in room;Ambulated with assistance to bathroom  Level of Assistance Standby assist, set-up cues, supervision of patient - no hands on  Assistive Device Front wheel walker  Distance Ambulated (ft) 10 ft  Activity Response Tolerated well  Mobility Referral Yes  $Mobility charge 1 Mobility  Mobility Specialist Start Time (ACUTE ONLY) 1022  Mobility Specialist Stop Time (ACUTE ONLY) 1034  Mobility Specialist Time Calculation (min) (ACUTE ONLY) 12 min   Author responds to chair alarm. Pt STS and ambulates to bathroom SBA. Pt left in bathroom with eduction on how to call for assistance and family in room.   Terrilyn Saver  Mobility Specialist  03/18/23 10:42 AM

## 2023-03-18 NOTE — TOC Progression Note (Signed)
Transition of Care [facility]) - Progression Note    Patient Details  Name: Karina Leonard MRN: [id number] Date of Birth: 09-Apr-1941  Transition of Care [facility]) CM/SW Contact  Carmina Miller, [place] Phone Number: 03/18/2023, 3:42 PM  Clinical Narrative:     CSW spoke with pt's daughter, she is requesting pt be reviewed for CIR, after speaking with PT, pt may actually not meet criteria for CIR or SNF as she is walking at least 100 feet, recommendation for SNF was made in part due to pt living alone but pt could go home with supervision and HH. Per pt's daughter, pt will not be returning home and will be staying with on of them for the foreseeable future. CSW spoke with pt's daughter, updated her on pt's PT progress, she is agreeable to outpatient PT, OT and speech, she believes they would prefer [facility] but will let RNCM/SW know tomorrow.   Expected Discharge Plan: Home w Home Health Services Barriers to Discharge: Continued Medical Work up  Expected Discharge Plan and Services       Living arrangements for the past 2 months: Single Family Home                                       Social Determinants of Health (SDOH) Interventions SDOH Screenings   Food Insecurity: No Food Insecurity (03/08/2023)  Housing: Low Risk  (03/08/2023)  Transportation Needs: No Transportation Needs (03/08/2023)  Utilities: Not At Risk (03/08/2023)  Alcohol Screen: Low Risk  (03/22/2022)  Depression (PHQ2-9): Low Risk  (02/02/2023)  Financial Resource Strain: Low Risk  (03/22/2022)  Physical Activity: Insufficiently Active (03/22/2022)  Social Connections: Moderately Isolated (03/22/2022)  Stress: No Stress Concern Present (03/22/2022)  Tobacco Use: Medium Risk (03/16/2023)    Readmission Risk Interventions     No data to display

## 2023-03-18 NOTE — Progress Notes (Signed)
[facility]  ROUNDING NOTE   Subjective:   Patient seen laying in bed, daughter at bedside Patient has been out of bed this morning to the bathroom with assistance Patient states she feels well today Completed breakfast tray at bedside   Creatinine 1.65   Objective:  Vital signs in last 24 hours:  Temp:  [97.5 F (36.4 C)-98.4 F (36.9 C)] 97.5 F (36.4 C) (05/26 0753) Pulse Rate:  [50-102] 53 (05/26 0753) Resp:  [16-18] 16 (05/26 0753) BP: (150-179)/(52-93) 179/64 (05/26 0753) SpO2:  [95 %-99 %] 99 % (05/26 0753)  Weight change:  Filed Weights   03/08/23 0626 03/08/23 0841  Weight: 87.5 kg 88.1 kg    Intake/Output: I/O last 3 completed shifts: In: 460 [P.O.:460] Out: -    Intake/Output this shift:  Total I/O In: 249 [P.O.:249] Out: -   Physical Exam: General: NAD  Head: Normocephalic, atraumatic. Moist oral mucosal membranes  Eyes: Anicteric  Lungs:  Clear on auscultation, room air  Heart: Regular rate and rhythm  Abdomen:  Soft, nontender  Extremities:  No peripheral edema.  Neurologic: Alert and oriented, moving all four extremities  Skin: No lesions  Access: None    Basic Metabolic Panel: Recent Labs  Lab 03/12/23 0507 03/13/23 0409 03/14/23 0519 03/15/23 0654 03/16/23 0540 03/17/23 0538 03/18/23 0424  NA 134* 132* 133* 133* 134* 136 139  K 3.7 3.9 4.3 4.1 4.0 3.6 3.7  CL 104 102 101 101 103 104 107  CO2 21* 22 23 21* 20* 22 24  GLUCOSE 124* 123* 156* 123* 124* 119* 116*  BUN 65* 73* 77* 83* 94* 91* 79*  CREATININE 3.37* 3.46* 2.54* 2.92* 2.77* 2.25* 1.65*  CALCIUM 7.8* 7.6* 8.2* 7.9* 8.2* 8.4* 8.5*  MG 2.1 2.1 2.2 2.2  --   --   --   PHOS 4.6 5.6* 4.4  --   --   --   --      Liver Function Tests: Recent Labs  Lab 03/12/23 0507  ALBUMIN 2.7*    No results for input(s): "LIPASE", "AMYLASE" in the last 168 hours. No results for input(s): "AMMONIA" in the last 168 hours.  CBC: Recent Labs  Lab 03/12/23 0855  03/13/23 0409 03/14/23 0519  WBC 12.8* 12.3* 20.3*  NEUTROABS  --   --  18.1*  HGB 11.2* 9.8* 11.0*  HCT 34.5* 30.2* 32.9*  MCV 93.8 94.7 92.7  PLT 187 173 217     Cardiac Enzymes: No results for input(s): "CKTOTAL", "CKMB", "CKMBINDEX", "TROPONINI" in the last 168 hours.  BNP: Invalid input(s): "POCBNP"  CBG: Recent Labs  Lab 03/17/23 0715 03/17/23 1156 03/17/23 1651 03/17/23 2042 03/18/23 0728  GLUCAP 108* 124* 145* 150* 106*     Microbiology: Results for orders placed or performed during the hospital encounter of 03/08/23  Urine Culture (for pregnant, neutropenic or urologic patients or patients with an indwelling urinary catheter)     Status: Abnormal   Collection Time: 03/12/23  8:11 AM   Specimen: Urine, Random  Result Value Ref Range Status   Specimen Description   Final    URINE, RANDOM Performed at [facility], [address] [place], [place] [address]    Special Requests   Final    NONE Performed at [facility], [address], [place], [place] [address]    Culture (A)  Final    <10,000 COLONIES/mL INSIGNIFICANT GROWTH Performed at [facility], [address], [place], [place] [address]  Report Status 03/13/2023 FINAL  Final    Coagulation Studies: No results for input(s): "LABPROT", "INR" in the last 72 hours.  Urinalysis: No results for input(s): "COLORURINE", "LABSPEC", "PHURINE", "GLUCOSEU", "HGBUR", "BILIRUBINUR", "KETONESUR", "PROTEINUR", "UROBILINOGEN", "NITRITE", "LEUKOCYTESUR" in the last 72 hours.  Invalid input(s): "APPERANCEUR"     Imaging: No results found.   Medications:    sodium chloride Stopped (03/09/23 1239)    amLODipine  10 mg Oral Daily   aspirin EC  81 mg Oral Daily   Chlorhexidine Gluconate Cloth  6 each Topical Daily   cloNIDine  0.2 mg Transdermal Weekly   docusate sodium  100 mg Oral BID   fluticasone  1 spray Each Nare Daily   heparin  5,000 Units Subcutaneous Q8H    hydrALAZINE  50 mg Oral Q8H   insulin aspart  0-5 Units Subcutaneous QHS   insulin aspart  0-9 Units Subcutaneous TID WC   loratadine  10 mg Oral Daily   pantoprazole  40 mg Oral Daily   pravastatin  40 mg Oral q1800   senna  1 tablet Oral Daily   venlafaxine XR  75 mg Oral QHS   acetaminophen **OR** acetaminophen, guaiFENesin-dextromethorphan, hydrALAZINE, melatonin, ondansetron, phenol, polyethylene glycol  Assessment/ Plan:  Karina Leonard is a 82 y.o.  female  with hypertension, peripheral vascular disease, carotid artery stenosis, coronary artery disease, atrial fibrillation, diastolic congestive heart failure and TIA, who was admitted to [facility] on 03/08/2023 for Malignant hypertension [I10]   Acute Kidney Injury on chronic kidney disease stage IIIA: baseline creatinine of 1.17, GFR of 47 on 02/27/23. Acute kidney injury secondary to hypertensive emergency. Urinalysis with proteinuria and hematuria from 2023. No IV contrast exposure. No signs of obstruction.  -Creatinine continues to improve - Will continue to monitor - Continue to avoid nephrotoxic agents   Lab Results  Component Value Date   CREATININE 1.65 (H) 03/18/2023   CREATININE 2.25 (H) 03/17/2023   CREATININE 2.77 (H) 03/16/2023    Intake/Output Summary (Last 24 hours) at 03/18/2023 1038 Last data filed at 03/18/2023 1029 Gross per 24 hour  Intake 609 ml  Output --  Net 609 ml     Hypertension: with hypertensive emergency on admission. Placed on clevedipine gtt while in ICU.Marland Kitchen Secondary work up shows no renal artery stenosis and normal thyroid function panel.   Home regimen of clonidine, furosemide, losartan, hydrochlorothiazide and metoprolol which have all been reordered.  Amlodipine added this admission -Continue holding hydrochlorothiazide, furosemide and losartan  - Plasma renin activity greater than 19000, aldosterone- negative,  and metanephrines pending. Catecholamines negative, cortisol 7.4 -Blood pressure  mains elevated.  Increased clonidine patch 0.2 mg yesterday.  Will increase hydralazine to 50 mg 3 times daily today.    LOS: 10   5/26/202410:38 AM

## 2023-03-19 DIAGNOSIS — I10 Essential (primary) hypertension: Secondary | ICD-10-CM | POA: Diagnosis not present

## 2023-03-19 LAB — BASIC METABOLIC PANEL
Anion gap: 8 (ref 5–15)
BUN: 62 mg/dL — ABNORMAL HIGH (ref 8–23)
CO2: 24 mmol/L (ref 22–32)
Calcium: 8.4 mg/dL — ABNORMAL LOW (ref 8.9–10.3)
Chloride: 108 mmol/L (ref 98–111)
Creatinine, Ser: 1.53 mg/dL — ABNORMAL HIGH (ref 0.44–1.00)
GFR, Estimated: 34 mL/min — ABNORMAL LOW (ref >60)
Glucose, Bld: 132 mg/dL — ABNORMAL HIGH (ref 70–99)
Potassium: 3.4 mmol/L — ABNORMAL LOW (ref 3.5–5.1)
Sodium: 140 mmol/L (ref 135–145)

## 2023-03-19 LAB — GLUCOSE, CAPILLARY
Glucose-Capillary: 154 mg/dL — ABNORMAL HIGH (ref 70–99)
Glucose-Capillary: 95 mg/dL (ref 70–99)
Glucose-Capillary: 109 mg/dL — ABNORMAL HIGH (ref 70–99)
Glucose-Capillary: 129 mg/dL — ABNORMAL HIGH (ref 70–99)

## 2023-03-19 MED ORDER — HYDRALAZINE HCL 20 MG/ML IJ SOLN
10.0000 mg | Freq: Once | INTRAMUSCULAR | Status: AC
  Administered 2023-03-19: 10 mg via INTRAVENOUS
  Filled 2023-03-19: qty 1

## 2023-03-19 MED ORDER — POTASSIUM CHLORIDE CRYS ER 20 MEQ PO TBCR
40.0000 meq | EXTENDED_RELEASE_TABLET | Freq: Once | ORAL | Status: AC
  Administered 2023-03-19: 40 meq via ORAL
  Filled 2023-03-19: qty 2

## 2023-03-19 NOTE — Care Management Important Message (Signed)
Important Message  Patient Details  Name: Karina Leonard MRN: [id number] Date of Birth: January 25, 1941   Medicare Important Message Given:  Yes     [name] 03/19/2023, 11:04 AM

## 2023-03-19 NOTE — Progress Notes (Signed)
[facility]  ROUNDING NOTE   Subjective:   Patient seen resting in bed, no family present Alert and oriented Reports appropriate appetite Reports diarrhea yesterday, no occurrences today  Patient seen later with attending, daughter at bedside  Creatinine 1.53   Objective:  Vital signs in last 24 hours:  Temp:  [97.6 F (36.4 C)-98 F (36.7 C)] 98 F (36.7 C) (05/27 0902) Pulse Rate:  [57-65] 61 (05/27 1210) Resp:  [16-20] 17 (05/27 1210) BP: (160-195)/(51-80) 192/62 (05/27 1210) SpO2:  [95 %-97 %] 96 % (05/27 1210)  Weight change:  Filed Weights   03/08/23 0626 03/08/23 0841  Weight: 87.5 kg 88.1 kg    Intake/Output: I/O last 3 completed shifts: In: 1089 [P.O.:1089] Out: -    Intake/Output this shift:  Total I/O In: 240 [P.O.:240] Out: -   Physical Exam: General: NAD  Head: Normocephalic, atraumatic. Moist oral mucosal membranes  Eyes: Anicteric  Lungs:  Clear on auscultation, room air  Heart: Regular rate and rhythm  Abdomen:  Soft, nontender  Extremities:  No peripheral edema.  Neurologic: Alert and oriented, moving all four extremities  Skin: No lesions  Access: None    Basic Metabolic Panel: Recent Labs  Lab 03/13/23 0409 03/14/23 0519 03/15/23 0654 03/16/23 0540 03/17/23 0538 03/18/23 0424 03/19/23 0509  NA 132* 133* 133* 134* 136 139 140  K 3.9 4.3 4.1 4.0 3.6 3.7 3.4*  CL 102 101 101 103 104 107 108  CO2 22 23 21* 20* 22 24 24   GLUCOSE 123* 156* 123* 124* 119* 116* 132*  BUN 73* 77* 83* 94* 91* 79* 62*  CREATININE 3.46* 2.54* 2.92* 2.77* 2.25* 1.65* 1.53*  CALCIUM 7.6* 8.2* 7.9* 8.2* 8.4* 8.5* 8.4*  MG 2.1 2.2 2.2  --   --   --   --   PHOS 5.6* 4.4  --   --   --   --   --      Liver Function Tests: No results for input(s): "AST", "ALT", "ALKPHOS", "BILITOT", "PROT", "ALBUMIN" in the last 168 hours.  No results for input(s): "LIPASE", "AMYLASE" in the last 168 hours. No results for input(s): "AMMONIA" in the last  168 hours.  CBC: Recent Labs  Lab 03/13/23 0409 03/14/23 0519  WBC 12.3* 20.3*  NEUTROABS  --  18.1*  HGB 9.8* 11.0*  HCT 30.2* 32.9*  MCV 94.7 92.7  PLT 173 217     Cardiac Enzymes: No results for input(s): "CKTOTAL", "CKMB", "CKMBINDEX", "TROPONINI" in the last 168 hours.  BNP: Invalid input(s): "POCBNP"  CBG: Recent Labs  Lab 03/18/23 1149 03/18/23 1703 03/18/23 2136 03/19/23 0904 03/19/23 1214  GLUCAP 103* 119* 100* 154* 95     Microbiology: Results for orders placed or performed during the hospital encounter of 03/08/23  Urine Culture (for pregnant, neutropenic or urologic patients or patients with an indwelling urinary catheter)     Status: Abnormal   Collection Time: 03/12/23  8:11 AM   Specimen: Urine, Random  Result Value Ref Range Status   Specimen Description   Final    URINE, RANDOM Performed at [facility], [address], [place], [place] [address]    Special Requests   Final    NONE Performed at [facility], [address], [place], [place] [address]    Culture (A)  Final    <10,000 COLONIES/mL INSIGNIFICANT GROWTH Performed at [facility], [address], [place], [place] [address]    Report Status 03/13/2023 FINAL  Final    Coagulation Studies: No results for input(s): "LABPROT", "INR" in the last 72 hours.  Urinalysis: No results for input(s): "COLORURINE", "LABSPEC", "PHURINE", "GLUCOSEU", "HGBUR", "BILIRUBINUR", "KETONESUR", "PROTEINUR", "UROBILINOGEN", "NITRITE", "LEUKOCYTESUR" in the last 72 hours.  Invalid input(s): "APPERANCEUR"     Imaging: No results found.   Medications:    sodium chloride Stopped (03/09/23 1239)    amLODipine  10 mg Oral Daily   aspirin EC  81 mg Oral Daily   Chlorhexidine Gluconate Cloth  6 each Topical Daily   cloNIDine  0.2 mg Transdermal Weekly   fluticasone  1 spray Each Nare Daily   heparin  5,000 Units Subcutaneous Q8H   hydrALAZINE  50 mg Oral Q8H   insulin  aspart  0-5 Units Subcutaneous QHS   insulin aspart  0-9 Units Subcutaneous TID WC   loratadine  10 mg Oral Daily   pantoprazole  40 mg Oral Daily   pravastatin  40 mg Oral q1800   venlafaxine XR  75 mg Oral QHS   acetaminophen **OR** acetaminophen, diphenoxylate-atropine, guaiFENesin-dextromethorphan, hydrALAZINE, melatonin, ondansetron, phenol, polyethylene glycol, white petrolatum  Assessment/ Plan:  [name] is a 82 y.o.  female  with hypertension, peripheral vascular disease, carotid artery stenosis, coronary artery disease, atrial fibrillation, diastolic congestive heart failure and TIA, who was admitted to [facility] on 03/08/2023 for Malignant hypertension [I10]   Acute Kidney Injury on chronic kidney disease stage IIIA: baseline creatinine of 1.17, GFR of 47 on 02/27/23. Acute kidney injury secondary to hypertensive emergency. Urinalysis with proteinuria and hematuria from 2023. No IV contrast exposure. No signs of obstruction.  -Creatinine continues to despite blood pressure challenges - Urine output acceptable - Patient encouraged to maintain oral intake   Lab Results  Component Value Date   CREATININE 1.53 (H) 03/19/2023   CREATININE 1.65 (H) 03/18/2023   CREATININE 2.25 (H) 03/17/2023    Intake/Output Summary (Last 24 hours) at 03/19/2023 1241 Last data filed at 03/19/2023 1028 Gross per 24 hour  Intake 840 ml  Output --  Net 840 ml     Hypertension: with hypertensive emergency on admission. Placed on clevedipine gtt while in ICU.Marland Kitchen Secondary work up shows no renal artery stenosis and normal thyroid function panel.   Home regimen of clonidine, furosemide, losartan, hydrochlorothiazide and metoprolol which have all been reordered.  Amlodipine added this admission -Continue holding hydrochlorothiazide, furosemide and losartan  - Plasma renin activity greater than 19000, aldosterone- negative,  and metanephrines pending. Catecholamines negative, cortisol 7.4 -Blood  pressure remains elevated despite recent changes to blood pressure regimen.  Discussed with patient and daughter today that we will allow time for current medications to settle and reassess.  No adjustments to medications today.    LOS: 11   5/27/202412:41 PM

## 2023-03-19 NOTE — Progress Notes (Signed)
PROGRESS NOTE    CATRINA CHISMAN  UJW:[id number] DOB: 1941-02-17 DOA: 03/08/2023 PCP: Duanne Limerick, MD  Outpatient Specialists: vascular surgery, cardiology    Brief Narrative:   This is an 82 yo female with carotid artery stenosis who presented for an elective left carotid endarterectomy per vascular surgery on 05/16. However, procedure canceled due to severe hypertension sbp 200's. Pt subsequently admitted to the stepdown unit per vascular surgery and nitroglycerin gtt initiated for bp control. Pt also restarted on her outpatient antihypertensives. However, she remained hypertensive and nicardipine gtt added. Pt later developed symptomatic hypotension sbp 90's to low 100's. According to pts daughters pts baseline sbp 140-160's. PCCM team consulted and levophed gtt initiated.  05/16: Pt initially admitted to the stepdown unit with hypertensive urgency and later developed symptomatic hypotension secondary to antihypertensive medication requiring levophed gtt PCCM team consulted to assist with management  05/18: Pt had another episode of accelerated hypertension we have reviewed            medications, cleviprex gtt started overnight. Patient seems to have developed hydralazine        tolerance with rebound effect. Have increased HCTZ to 50 from 25.  Have met with        cardiology - Dr Juliann Pares , amlodipine 5 bid has been initiated. Will consider additional        therapy as needed while weaning down cleviprex gtt.  Her Chronic renal impairment        seems to have acute component with oliguric aki, will get nephrology to evaluate.  05/19: Pt in no distress this am. BP still elevated and still requires cleviprex titration with the PO regimen. Nephrology restarted clonidine due to concern of rebound hypertension following discontinuation of outpatient clonidine patch  05/20: Cleviprex gtt currently off.  SBP improved significantly now 130-150.  Pt now with increasing troponin [phone]  Cardiology following and aware 05/20: MRA Head/Neck: multiple scattered bilateral small acute infarcts. The largest        lesions are located in the right cerebellum, left temporal lobe and left basal ganglia. No        hemorrhage or mass effect. Loss of flow related enhancement within the left vertebral        artery V4 segment, consistent with slow flow or occlusion. Signal loss of the distal right        common carotid artery and left internal carotid artery near the carotid bifurcation. This        may be exaggerated by motion artifact, but there is likely a component of stenosis or        occlusion. CTA of the neck may be helpful for further characterization. Multiple chronic        microhemorrhages in a predominantly central distribution, likely due to chronic        hypertensive microangiopathy. 05/21: Code stroke initiated on 05/20 due to new onset right-sided facial droop and RUE        weakness.  CT Head revealed new area of hypoattenuation in the left inferior temporal        gyrus, concerning for acute infarct. No acute intracranial hemorrhage. ASPECT score is 10.   Assessment & Plan:   Principal Problem:   Malignant hypertension Active Problems:   Coronary artery disease   Carotid stenosis   Diabetes mellitus without complication (HCC)   Acute CVA (cerebrovascular accident) (HCC)   AKI (acute kidney injury) (HCC)  # Hypertensive emergency # Chronic hypertension Now  with rebound hypertension after clonidine was discontinued. BPs improving but still elevated. Cardiology manages her htn as outpt. Developed symptomatic hypotension on nicardipine gtt. With widened pulse pressure - arterial wall stiffness likely confounds BPs to some extent. Systolics unfortunately still above goal. Hyperaldo labs negative, normal catecholamine - amlodipine 10 - continue clonidine patch  - added hydral on 5/24, dose increased yesterday - vascular advising further outpatient CTA to eval for  RAS - home hctz, lasix, losartan on hold 2/2 aki but may be able to add back soon - nephrology wants to hold on further adjustments today - f/u metanephrines  # Debility TOC working on SNF  # CVA, acute Multiple. Neurology has signed off.  - continue aspirin/statin - cardiac monitor at discharge (already placed?) - endarterectomy planning as below - pt/ot advising SNF, TOC aware  # AKI on ckd 3a Baseline cr of 1.17, here peaked at 3s, today 1.53, UOP adequate. Nephrology following.   # Carotid stenosis Vascular advising close outpatient f/u 1-2 weeks to make plans for endarterectomy - continue aspirin/plavix  # Demand ischemia vs nstemi Troponin peak in the 3000s. Currently asymptomatic. Cardiology has signed off, they are deferring additional diagnostics this hospitalization - cont asa/statin - close outpt f/u  # Constipation # Loose stool Constipation resolved, loose stool last 24, no fevers or abd pain - bowel regimen on hold - further w/u of loose stools if persist    DVT prophylaxis: heparin Code Status: full Family Communication: daughter updated @ bedside  Level of care: Telemetry Medical Status is: Inpatient Remains inpatient appropriate because: ongoing bp mgmt    Consultants:  Nephrology, cardiology, vascular surgery, neurology  Procedures: none  Antimicrobials:  S/p cipro    Subjective: Feeling well, no complaints, loose stool yesterday and overnight  Objective: Vitals:   03/19/23 0426 03/19/23 0609 03/19/23 0902 03/19/23 1210  BP: (!) 195/56 (!) 190/51 (!) 171/59 (!) 192/62  Pulse: (!) 57 62 65 61  Resp: 20 18 16 17   Temp: 97.6 F (36.4 C)  98 F (36.7 C)   TempSrc: Oral     SpO2: 96% 95% 97% 96%  Weight:      Height:        Intake/Output Summary (Last 24 hours) at 03/19/2023 1314 Last data filed at 03/19/2023 1028 Gross per 24 hour  Intake 840 ml  Output --  Net 840 ml   Filed Weights   03/08/23 0626 03/08/23 0841  Weight:  87.5 kg 88.1 kg    Examination:  General exam: Appears calm and comfortable  Respiratory system: normal respiratory rate and work of breathing Cardiovascular system: RRR, distant heart sounds Gastrointestinal system: deferred Central nervous system: Alert and oriented. No focal neurological deficits. Extremities: warm, no edema Skin: No visible rashes, lesions or ulcers Psychiatry: Judgement and insight appear normal. Mood & affect appropriate.     Data Reviewed: I have personally reviewed following labs and imaging studies  CBC: Recent Labs  Lab 03/13/23 0409 03/14/23 0519  WBC 12.3* 20.3*  NEUTROABS  --  18.1*  HGB 9.8* 11.0*  HCT 30.2* 32.9*  MCV 94.7 92.7  PLT 173 217   Basic Metabolic Panel: Recent Labs  Lab 03/13/23 0409 03/14/23 0519 03/15/23 0654 03/16/23 0540 03/17/23 0538 03/18/23 0424 03/19/23 0509  NA 132* 133* 133* 134* 136 139 140  K 3.9 4.3 4.1 4.0 3.6 3.7 3.4*  CL 102 101 101 103 104 107 108  CO2 22 23 21* 20* 22 24 24   GLUCOSE 123*  156* 123* 124* 119* 116* 132*  BUN 73* 77* 83* 94* 91* 79* 62*  CREATININE 3.46* 2.54* 2.92* 2.77* 2.25* 1.65* 1.53*  CALCIUM 7.6* 8.2* 7.9* 8.2* 8.4* 8.5* 8.4*  MG 2.1 2.2 2.2  --   --   --   --   PHOS 5.6* 4.4  --   --   --   --   --    GFR: Estimated Creatinine Clearance: 33.4 mL/min (A) (by C-G formula based on SCr of 1.53 mg/dL (H)). Liver Function Tests: No results for input(s): "AST", "ALT", "ALKPHOS", "BILITOT", "PROT", "ALBUMIN" in the last 168 hours.  No results for input(s): "LIPASE", "AMYLASE" in the last 168 hours. No results for input(s): "AMMONIA" in the last 168 hours. Coagulation Profile: No results for input(s): "INR", "PROTIME" in the last 168 hours. Cardiac Enzymes: No results for input(s): "CKTOTAL", "CKMB", "CKMBINDEX", "TROPONINI" in the last 168 hours. BNP (last 3 results) No results for input(s): "PROBNP" in the last 8760 hours. HbA1C: No results for input(s): "HGBA1C" in the last 72  hours. CBG: Recent Labs  Lab 03/18/23 1149 03/18/23 1703 03/18/23 2136 03/19/23 0904 03/19/23 1214  GLUCAP 103* 119* 100* 154* 95   Lipid Profile: No results for input(s): "CHOL", "HDL", "LDLCALC", "TRIG", "CHOLHDL", "LDLDIRECT" in the last 72 hours.  Thyroid Function Tests: No results for input(s): "TSH", "T4TOTAL", "FREET4", "T3FREE", "THYROIDAB" in the last 72 hours. Anemia Panel: No results for input(s): "VITAMINB12", "FOLATE", "FERRITIN", "TIBC", "IRON", "RETICCTPCT" in the last 72 hours. Urine analysis:    Component Value Date/Time   COLORURINE YELLOW (A) 03/11/2023 1637   APPEARANCEUR HAZY (A) 03/11/2023 1637   LABSPEC 1.011 03/11/2023 1637   PHURINE 5.0 03/11/2023 1637   GLUCOSEU NEGATIVE 03/11/2023 1637   HGBUR SMALL (A) 03/11/2023 1637   BILIRUBINUR NEGATIVE 03/11/2023 1637   BILIRUBINUR negative 01/21/2020 1202   KETONESUR NEGATIVE 03/11/2023 1637   PROTEINUR 30 (A) 03/11/2023 1637   UROBILINOGEN 0.2 01/21/2020 1202   NITRITE NEGATIVE 03/11/2023 1637   LEUKOCYTESUR LARGE (A) 03/11/2023 1637   Sepsis Labs: @LABRCNTIP (procalcitonin:4,lacticidven:4)  ) Recent Results (from the past 240 hour(s))  Urine Culture (for pregnant, neutropenic or urologic patients or patients with an indwelling urinary catheter)     Status: Abnormal   Collection Time: 03/12/23  8:11 AM   Specimen: Urine, Random  Result Value Ref Range Status   Specimen Description   Final    URINE, RANDOM Performed at [facility], [address], [place], [place] [address]    Special Requests   Final    NONE Performed at [facility], [address], [place], [place] [address]    Culture (A)  Final    <10,000 COLONIES/mL INSIGNIFICANT GROWTH Performed at [facility], [address] [place], [place] [address]    Report Status 03/13/2023 FINAL  Final         Radiology Studies: No results found.      Scheduled Meds:  amLODipine  10 mg Oral Daily    aspirin EC  81 mg Oral Daily   Chlorhexidine Gluconate Cloth  6 each Topical Daily   cloNIDine  0.2 mg Transdermal Weekly   fluticasone  1 spray Each Nare Daily   heparin  5,000 Units Subcutaneous Q8H   hydrALAZINE  50 mg Oral Q8H   insulin aspart  0-5 Units Subcutaneous QHS   insulin aspart  0-9 Units Subcutaneous TID WC   loratadine  10 mg Oral Daily   pantoprazole  40  mg Oral Daily   potassium chloride  40 mEq Oral Once   pravastatin  40 mg Oral q1800   venlafaxine XR  75 mg Oral QHS   Continuous Infusions:  sodium chloride Stopped (03/09/23 1239)     LOS: 11 days     Silvano Bilis, MD [facility]   If 7PM-7AM, please contact night-coverage [email or web address] Password [email or web address] 03/19/2023, 1:14 PM

## 2023-03-19 NOTE — TOC Progression Note (Addendum)
Transition of Care [facility]) - Progression Note    Patient Details  Name: Karina Leonard MRN: [id number] Date of Birth: 09-Jun-1941  Transition [facility]) CM/SW Contact  Allena Katz, LCSW Phone Number: 03/19/2023, 10:13 AM  Clinical Narrative:   TOC spoke with patients daughter Duwayne Heck to follow up on previous conversation with weekend TOC. Danielle report she would like for pt to go to SNF as pt was fully independent before admitting to the hospital. Per Duwayne Heck, pt can rotate between her and her two sisters when needed but would like to look at options for care in the future regarding ALF and aide services at discharge. Duwayne Heck reports pt has a spousal benefit through the [place] and would like to see if that would pay for any additional assistance. TOC discussed with Duwayne Heck options regarding future care and she would like to see what options are available and is agreeable to referral being made to [facility]. Referral faxed to [facility]. Daughter would like referrals sent to [facility] in [place] and [facility]. Referral faxed to [facility]. No response at this time from [facility].   11:33 am  [facility] declined. TOC shared with Duwayne Heck who would like TOC to try [facility] [place]. Referral faxed to [facility] and message left for [facility] liaison [phone]. Referral also faxed to [facility] admissions are not in today so VM was left.   Expected Discharge Plan: Home w Home Health Services Barriers to Discharge: Continued Medical Work up  Expected Discharge Plan and Services       Living arrangements for the past 2 months: Single Family Home                                       Social Determinants of Health (SDOH) Interventions SDOH Screenings   Food Insecurity: No Food Insecurity (03/08/2023)  Housing: Low Risk  (03/08/2023)  Transportation Needs: No Transportation Needs (03/08/2023)  Utilities: Not At Risk (03/08/2023)  Alcohol Screen: Low  Risk  (03/22/2022)  Depression (PHQ2-9): Low Risk  (02/02/2023)  Financial Resource Strain: Low Risk  (03/22/2022)  Physical Activity: Insufficiently Active (03/22/2022)  Social Connections: Moderately Isolated (03/22/2022)  Stress: No Stress Concern Present (03/22/2022)  Tobacco Use: Medium Risk (03/16/2023)    Readmission Risk Interventions     No data to display

## 2023-03-19 NOTE — Evaluation (Signed)
Occupational Therapy Evaluation Patient Details Name: Karina Leonard MRN: [id number] DOB: [birth year] Today's Date: 03/19/2023   History of Present Illness Pt is an 82 yo female with PMH that includes carotid artery stenosis, A-fib with slow ventricular response, bradycardia, CAD, DDD, depression, diastolic dysfunction, lipoma, HTN, pre-diabetes, TIA, and sciatica who presented for an elective left carotid endarterectomy per vascular surgery on 05/16.  However, procedure canceled due to severe hypertension sbp 200's.  Pt subsequently admitted to the stepdown. MD assessment includes: multiple bilateral small acute infarcts with largest lesions in the R cerebellum, L temporal lobe, and L basal ganglia, malignant hypertension, left carotid artery stenosis, junctional bradycardia, demand Ischemia vs ACS, and AKI on CKD IIIa.   Clinical Impression   Karina Leonard was seen for OT tx session this date. Pt continues to be functionally limited by impaired balance, decreased activity tolerance, and generalized weakness. She requires min cueing for sequencing of tasks this date and assist from this author to facilitate ADL management as described below (see ADL section for additional detail).  Pt making good progress toward goals and continues to benefit from skilled OT services to maximize return to PLOF and minimize risk of future falls, injury, caregiver burden, and readmission. Will continue to follow POC. Discharge recommendation remains appropriate.        Recommendations for follow up therapy are one component of a multi-disciplinary discharge planning process, led by the attending physician.  Recommendations may be updated based on patient status, additional functional criteria and insurance authorization.   Assistance Recommended at Discharge Frequent or constant Supervision/Assistance  Patient can return home with the following A little help with walking and/or transfers;A little help with  bathing/dressing/bathroom;Assistance with cooking/housework;Assist for transportation;Help with stairs or ramp for entrance;Direct supervision/assist for financial management;Direct supervision/assist for medications management    Functional Status Assessment  Patient has had a recent decline in their functional status and demonstrates the ability to make significant improvements in function in a reasonable and predictable amount of time.  Equipment Recommendations  None recommended by OT    Recommendations for Other Services       Precautions / Restrictions Precautions Precautions: Fall Restrictions Weight Bearing Restrictions: No Other Position/Activity Restrictions: Watch HR      Mobility Bed Mobility Overal bed mobility: Needs Assistance             General bed mobility comments: Deferred. Pt in recliner at start/end of session.    Transfers Overall transfer level: Needs assistance Equipment used: Rolling walker (2 wheels) Transfers: Sit to/from Stand Sit to Stand: Supervision, Min assist           General transfer comment: x1 instance of lateral LOB to R side, with pt requiring MIN A to safely maintain standing balance during functional mobility in room. Otherwise, pt is able to stand at sink for ~10 min for self care without UE support and supervision for safety.      Balance Overall balance assessment: Needs assistance Sitting-balance support: No upper extremity supported, Feet supported Sitting balance-Leahy Scale: Good     Standing balance support: Reliant on assistive device for balance, During functional activity, Single extremity supported, Bilateral upper extremity supported                               ADL either performed or assessed with clinical judgement   ADL Overall ADL's : Needs assistance/impaired Eating/Feeding: Set up;Sitting   Grooming: Wash/dry face;Oral  care;Standing;Supervision/safety;Set up;Applying  deodorant;Sitting Grooming Details (indicate cue type and reason): Min cueing for sequencing oral care task 2/2 abundance of personal items from home causing intermittent distractions. Upper Body Bathing: Standing;Set up;Supervision/ safety;Cueing for safety         Upper Body Dressing Details (indicate cue type and reason): Min A to tie gown at back                 Functional mobility during ADLs: Supervision/safety;Set up;Rolling walker (2 wheels);Cueing for safety General ADL Comments: x1 instance lateral LOB during functional mobility in room requiring MIN A to safetly maintain upright standing balance.     Vision Patient Visual Report: No change from baseline       Perception     Praxis      Pertinent Vitals/Pain Pain Assessment Pain Assessment: No/denies pain     Hand Dominance     Extremity/Trunk Assessment             Communication     Cognition Arousal/Alertness: Awake/alert Behavior During Therapy: WFL for tasks assessed/performed Overall Cognitive Status: Impaired/Different from baseline Area of Impairment: Following commands, Awareness, Problem solving                       Following Commands: Follows one step commands with increased time   Awareness: Emergent Problem Solving: Slow processing, Requires verbal cues General Comments: Pt follows commands with increased time and supportive family in room.     General Comments  Pt noted with brief instance of SOB and reports "it feels like my heart is racing" at end of session. Pt had been seated for ~3 minutes but had completed some LB grooming (applying lotion). Pt noted to be in [facility] per tele monitor. RN aware and in room to assess.    Exercises Other Exercises Other Exercises: OT facilitated ADL management as described above with ongoing education on safety, falls prevention, and compensatory ADL management strategies t/o session. Supportive family in room return verbalizes  understanding of education provided.   Shoulder Instructions      Home Living                                          Prior Functioning/Environment                          OT Problem List: Decreased strength;Decreased activity tolerance;Decreased safety awareness;Impaired balance (sitting and/or standing);Decreased knowledge of use of DME or AE;Decreased coordination      OT Treatment/Interventions: Self-care/ADL training;Therapeutic exercise;Therapeutic activities;Energy conservation;Manual therapy;Balance training;Patient/family education    OT Goals(Current goals can be found in the care plan section) Acute Rehab OT Goals Patient Stated Goal: to get stronger and return to PLOF OT Goal Formulation: With patient/family Time For Goal Achievement: 03/27/23 Potential to Achieve Goals: Fair  OT Frequency: Min 3X/week    Co-evaluation              AM-PAC OT "6 Clicks" Daily Activity     Outcome Measure Help from another person eating meals?: None Help from another person taking care of personal grooming?: A Little Help from another person toileting, which includes using toliet, bedpan, or urinal?: A Little Help from another person bathing (including washing, rinsing, drying)?: A Little Help from another person to put on and taking off regular upper body clothing?:  A Little Help from another person to put on and taking off regular lower body clothing?: A Little 6 Click Score: 19   End of Session Equipment Utilized During Treatment: Rolling walker (2 wheels) Nurse Communication: Mobility status  Activity Tolerance: Patient tolerated treatment well Patient left: in chair;with call bell/phone within reach;with family/visitor present  OT Visit Diagnosis: Unsteadiness on feet (R26.81);Repeated falls (R29.6);Muscle weakness (generalized) (M62.81)                Time: 1308-6578 OT Time Calculation (min): 35 min Charges:  OT General Charges $OT  Visit: 1 Visit OT Treatments $Self Care/Home Management : 23-37 mins  Rockney Ghee, M.S., OTR/L 03/19/23, 2:20 PM

## 2023-03-19 NOTE — NC FL2 (Signed)
[place] MEDICAID FL2 LEVEL OF CARE FORM     IDENTIFICATION  Patient Name: Karina Leonard Birthdate: [birth year] Sex: female Admission Date (Current Location): 03/08/2023  [facility] and [place] Number:  Chiropodist and Address:  [facility], [address], [place], [place] [address]      Provider Number: [id number]  Attending Physician Name and Address:  Kathrynn Running, MD  Relative Name and Phone Number:  Caprice Beaver [phone]    Current Level of Care: Hospital Recommended Level of Care: Skilled Nursing Facility Prior Approval Number:    Date Approved/Denied:   PASRR Number: [id number]  Discharge Plan: SNF    Current Diagnoses: Patient Active Problem List   Diagnosis Date Noted   Acute CVA (cerebrovascular accident) (HCC) 03/14/2023   AKI (acute kidney injury) (HCC) 03/14/2023   Malignant hypertension 03/08/2023   Carotid stenosis 02/09/2023   Diabetes mellitus without complication (HCC) 02/09/2023   Urinary incontinence 07/19/2021   Need for vaccination 03/18/2019   H/O hypercholesterolemia 03/18/2019   Drug-induced obesity 03/18/2019   Coronary artery disease 03/18/2019   Cataract cortical, senile 03/18/2019   Taking medication for chronic disease 03/18/2019   Numbness and tingling of right upper extremity 08/15/2016   Lipoma of right upper extremity 08/15/2016   Cerebral microvascular disease 07/23/2015   Familial multiple lipoprotein-type hyperlipidemia 02/16/2015   Anxiety attack 02/16/2015   Recurrent major depressive episodes (HCC) 02/16/2015   Essential (primary) hypertension 02/16/2015   H/O: HTN (hypertension) 02/16/2015   Asymptomatic hypertensive urgency 11/14/2014   Tubulovillous adenoma of rectum 10/21/2014   Depression 10/21/2014   Nuclear sclerosis of both eyes 07/07/2014    Orientation RESPIRATION BLADDER Height & Weight     Self, Time, Situation, Place  Normal Continent Weight: 194 lb 3.6 oz  (88.1 kg) Height:  5\' 7"  (170.2 cm)  BEHAVIORAL SYMPTOMS/MOOD NEUROLOGICAL BOWEL NUTRITION STATUS      Continent  (See Discharge Summary)  AMBULATORY STATUS COMMUNICATION OF NEEDS Skin   Extensive Assist Verbally Normal                       Personal Care Assistance Level of Assistance  Total care, Bathing, Dressing, Feeding Bathing Assistance: Maximum assistance Feeding assistance: Limited assistance Dressing Assistance: Maximum assistance     Functional Limitations Info  Sight, Hearing, Speech Sight Info: Adequate Hearing Info: Adequate Speech Info: Adequate    SPECIAL CARE FACTORS FREQUENCY  PT (By licensed PT), OT (By licensed OT)     PT Frequency: 5x weekly OT Frequency: 5x weekly            Contractures Contractures Info: Not present    Additional Factors Info  Code Status, Allergies Code Status Info: Full Code Allergies Info: Amoxicillin, Donepezil, Nicardipine Hcl In Dextrose, Penicillins           Current Medications (03/19/2023):  This is the current hospital active medication list Current Facility-Administered Medications  Medication Dose Route Frequency Provider Last Rate Last Admin   0.9 %  sodium chloride infusion  250 mL Intravenous Continuous Ezequiel Essex, NP   Stopped at 03/09/23 1239   acetaminophen (TYLENOL) tablet 325-650 mg  325-650 mg Oral Q4H PRN Annice Needy, MD   650 mg at 03/18/23 2158   Or   acetaminophen (TYLENOL) suppository 325-650 mg  325-650 mg Rectal Q4H PRN Annice Needy, MD       amLODipine (NORVASC) tablet 10 mg  10 mg Oral Daily Wouk,  Wilfred Curtis, MD   10 mg at 03/19/23 0941   aspirin EC tablet 81 mg  81 mg Oral Daily Annice Needy, MD   81 mg at 03/19/23 0941   Chlorhexidine Gluconate Cloth 2 % PADS 6 each  6 each Topical Daily Annice Needy, MD   6 each at 03/19/23 0941   cloNIDine (CATAPRES - Dosed in mg/24 hr) patch 0.2 mg  0.2 mg Transdermal Weekly Kathrynn Running, MD   0.2 mg at 03/17/23 1547    diphenoxylate-atropine (LOMOTIL) 2.5-0.025 MG per tablet 1 tablet  1 tablet Oral QID PRN Enedina Finner, MD   1 tablet at 03/19/23 1022   fluticasone (FLONASE) 50 MCG/ACT nasal spray 1 spray  1 spray Each Nare Daily Annice Needy, MD   1 spray at 03/19/23 0941   guaiFENesin-dextromethorphan (ROBITUSSIN DM) 100-10 MG/5ML syrup 15 mL  15 mL Oral Q4H PRN Annice Needy, MD       heparin injection 5,000 Units  5,000 Units Subcutaneous Q8H Kathrynn Running, MD   5,000 Units at 03/19/23 1344   hydrALAZINE (APRESOLINE) injection 20 mg  20 mg Intravenous Q6H PRN Kathrynn Running, MD   20 mg at 03/15/23 2014   hydrALAZINE (APRESOLINE) tablet 50 mg  50 mg Oral Q8H Lateef, Munsoor, MD   50 mg at 03/19/23 1344   insulin aspart (novoLOG) injection 0-5 Units  0-5 Units Subcutaneous QHS Ezequiel Essex, NP       insulin aspart (novoLOG) injection 0-9 Units  0-9 Units Subcutaneous TID WC Ezequiel Essex, NP   2 Units at 03/19/23 0939   loratadine (CLARITIN) tablet 10 mg  10 mg Oral Daily Annice Needy, MD   10 mg at 03/19/23 0941   melatonin tablet 5 mg  5 mg Oral QHS PRN Rust-Chester, Cecelia Byars, NP   5 mg at 03/18/23 2158   ondansetron (ZOFRAN) injection 4 mg  4 mg Intravenous Q6H PRN Annice Needy, MD       pantoprazole (PROTONIX) EC tablet 40 mg  40 mg Oral Daily Annice Needy, MD   40 mg at 03/19/23 0941   phenol (CHLORASEPTIC) mouth spray 1 spray  1 spray Mouth/Throat PRN Annice Needy, MD       pravastatin (PRAVACHOL) tablet 40 mg  40 mg Oral q1800 Annice Needy, MD   40 mg at 03/18/23 1733   venlafaxine XR (EFFEXOR-XR) 24 hr capsule 75 mg  75 mg Oral QHS Annice Needy, MD   75 mg at 03/18/23 2158   white petrolatum (VASELINE) gel 2 Application  2 Application Topical PRN Jaynie Bream, [facility]   2 Application at 03/18/23 2159     Discharge Medications: Please see discharge summary for a list of discharge medications.  Relevant Imaging Results:  Relevant Lab Results:   Additional  Information [id number]  Allena Katz, LCSW

## 2023-03-20 DIAGNOSIS — I10 Essential (primary) hypertension: Secondary | ICD-10-CM | POA: Diagnosis not present

## 2023-03-20 LAB — CBC
HCT: 32.4 % — ABNORMAL LOW (ref 36.0–46.0)
Hemoglobin: 10.4 g/dL — ABNORMAL LOW (ref 12.0–15.0)
MCH: 30.5 pg (ref 26.0–34.0)
MCHC: 32.1 g/dL (ref 30.0–36.0)
MCV: 95 fL (ref 80.0–100.0)
Platelets: 318 10*3/uL (ref 150–400)
RBC: 3.41 MIL/uL — ABNORMAL LOW (ref 3.87–5.11)
RDW: 14.4 % (ref 11.5–15.5)
WBC: 13.4 10*3/uL — ABNORMAL HIGH (ref 4.0–10.5)
nRBC: 0 % (ref 0.0–0.2)

## 2023-03-20 LAB — MAGNESIUM: Magnesium: 2.5 mg/dL — ABNORMAL HIGH (ref 1.7–2.4)

## 2023-03-20 LAB — GLUCOSE, CAPILLARY
Glucose-Capillary: 133 mg/dL — ABNORMAL HIGH (ref 70–99)
Glucose-Capillary: 149 mg/dL — ABNORMAL HIGH (ref 70–99)
Glucose-Capillary: 122 mg/dL — ABNORMAL HIGH (ref 70–99)
Glucose-Capillary: 117 mg/dL — ABNORMAL HIGH (ref 70–99)

## 2023-03-20 LAB — BASIC METABOLIC PANEL
Anion gap: 10 (ref 5–15)
BUN: 54 mg/dL — ABNORMAL HIGH (ref 8–23)
CO2: 21 mmol/L — ABNORMAL LOW (ref 22–32)
Calcium: 8.7 mg/dL — ABNORMAL LOW (ref 8.9–10.3)
Chloride: 111 mmol/L (ref 98–111)
Creatinine, Ser: 1.53 mg/dL — ABNORMAL HIGH (ref 0.44–1.00)
GFR, Estimated: 34 mL/min — ABNORMAL LOW (ref >60)
Glucose, Bld: 139 mg/dL — ABNORMAL HIGH (ref 70–99)
Potassium: 4.4 mmol/L (ref 3.5–5.1)
Sodium: 142 mmol/L (ref 135–145)

## 2023-03-20 MED ORDER — LOSARTAN POTASSIUM 50 MG PO TABS
50.0000 mg | ORAL_TABLET | Freq: Every day | ORAL | Status: DC
  Administered 2023-03-20 – 2023-03-22 (×3): 50 mg via ORAL
  Filled 2023-03-20 (×3): qty 1

## 2023-03-20 NOTE — Progress Notes (Signed)
[facility]  ROUNDING NOTE   Subjective:   Patient seen sitting in chair Daughter at bedside Adequate appetite, denies nausea and vomiting Room air  Creatinine remains 1.53   Objective:  Vital signs in last 24 hours:  Temp:  [97.7 F (36.5 C)-98.1 F (36.7 C)] 97.7 F (36.5 C) (05/28 0750) Pulse Rate:  [54-68] 64 (05/28 1211) Resp:  [16-21] 16 (05/28 1211) BP: (169-205)/(45-75) 169/52 (05/28 1211) SpO2:  [94 %-100 %] 99 % (05/28 1211)  Weight change:  Filed Weights   03/08/23 0626 03/08/23 0841  Weight: 87.5 kg 88.1 kg    Intake/Output: I/O last 3 completed shifts: In: 480 [P.O.:480] Out: -    Intake/Output this shift:  Total I/O In: 118 [P.O.:118] Out: -   Physical Exam: General: NAD  Head: Normocephalic, atraumatic. Moist oral mucosal membranes  Eyes: Anicteric  Lungs:  Clear on auscultation, room air  Heart: Regular rate and rhythm  Abdomen:  Soft, nontender  Extremities:  No peripheral edema.  Neurologic: Alert and oriented, moving all four extremities  Skin: No lesions  Access: None    Basic Metabolic Panel: Recent Labs  Lab 03/14/23 0519 03/15/23 0654 03/16/23 0540 03/17/23 0538 03/18/23 0424 03/19/23 0509 03/20/23 0427  NA 133* 133* 134* 136 139 140 142  K 4.3 4.1 4.0 3.6 3.7 3.4* 4.4  CL 101 101 103 104 107 108 111  CO2 23 21* 20* 22 24 24  21*  GLUCOSE 156* 123* 124* 119* 116* 132* 139*  BUN 77* 83* 94* 91* 79* 62* 54*  CREATININE 2.54* 2.92* 2.77* 2.25* 1.65* 1.53* 1.53*  CALCIUM 8.2* 7.9* 8.2* 8.4* 8.5* 8.4* 8.7*  MG 2.2 2.2  --   --   --   --  2.5*  PHOS 4.4  --   --   --   --   --   --      Liver Function Tests: No results for input(s): "AST", "ALT", "ALKPHOS", "BILITOT", "PROT", "ALBUMIN" in the last 168 hours.  No results for input(s): "LIPASE", "AMYLASE" in the last 168 hours. No results for input(s): "AMMONIA" in the last 168 hours.  CBC: Recent Labs  Lab 03/14/23 0519 03/20/23 0427  WBC 20.3* 13.4*   NEUTROABS 18.1*  --   HGB 11.0* 10.4*  HCT 32.9* 32.4*  MCV 92.7 95.0  PLT 217 318     Cardiac Enzymes: No results for input(s): "CKTOTAL", "CKMB", "CKMBINDEX", "TROPONINI" in the last 168 hours.  BNP: Invalid input(s): "POCBNP"  CBG: Recent Labs  Lab 03/19/23 1214 03/19/23 1712 03/19/23 2034 03/20/23 0747 03/20/23 1114  GLUCAP 95 109* 129* 133* 149*     Microbiology: Results for orders placed or performed during the hospital encounter of 03/08/23  Urine Culture (for pregnant, neutropenic or urologic patients or patients with an indwelling urinary catheter)     Status: Abnormal   Collection Time: 03/12/23  8:11 AM   Specimen: Urine, Random  Result Value Ref Range Status   Specimen Description   Final    URINE, RANDOM Performed at [facility], [address], [place], [place] [address]    Special Requests   Final    NONE Performed at [facility], [address], [place], [place] [address]    Culture (A)  Final    <10,000 COLONIES/mL INSIGNIFICANT GROWTH Performed at [facility], [address], [place], [place] [address]    Report Status 03/13/2023 FINAL  Final    Coagulation Studies: No results for  input(s): "LABPROT", "INR" in the last 72 hours.  Urinalysis: No results for input(s): "COLORURINE", "LABSPEC", "PHURINE", "GLUCOSEU", "HGBUR", "BILIRUBINUR", "KETONESUR", "PROTEINUR", "UROBILINOGEN", "NITRITE", "LEUKOCYTESUR" in the last 72 hours.  Invalid input(s): "APPERANCEUR"     Imaging: No results found.   Medications:    sodium chloride Stopped (03/09/23 1239)    amLODipine  10 mg Oral Daily   aspirin EC  81 mg Oral Daily   Chlorhexidine Gluconate Cloth  6 each Topical Daily   cloNIDine  0.2 mg Transdermal Weekly   fluticasone  1 spray Each Nare Daily   heparin  5,000 Units Subcutaneous Q8H   hydrALAZINE  50 mg Oral Q8H   insulin aspart  0-5 Units Subcutaneous QHS   insulin aspart  0-9 Units Subcutaneous TID WC    loratadine  10 mg Oral Daily   losartan  50 mg Oral Daily   pantoprazole  40 mg Oral Daily   pravastatin  40 mg Oral q1800   venlafaxine XR  75 mg Oral QHS   acetaminophen **OR** acetaminophen, diphenoxylate-atropine, guaiFENesin-dextromethorphan, hydrALAZINE, melatonin, ondansetron, phenol, white petrolatum  Assessment/ Plan:  [name] is a 82 y.o.  female  with hypertension, peripheral vascular disease, carotid artery stenosis, coronary artery disease, atrial fibrillation, diastolic congestive heart failure and TIA, who was admitted to [facility] on 03/08/2023 for Malignant hypertension [I10]   Acute Kidney Injury on chronic kidney disease stage IIIA: baseline creatinine of 1.17, GFR of 47 on 02/27/23. Acute kidney injury secondary to hypertensive emergency. Urinalysis with proteinuria and hematuria from 2023. No IV contrast exposure. No signs of obstruction.  -Creatinine stable - No acute need for dialysis    Lab Results  Component Value Date   CREATININE 1.53 (H) 03/20/2023   CREATININE 1.53 (H) 03/19/2023   CREATININE 1.65 (H) 03/18/2023    Intake/Output Summary (Last 24 hours) at 03/20/2023 1256 Last data filed at 03/20/2023 1014 Gross per 24 hour  Intake 358 ml  Output --  Net 358 ml     Hypertension: with hypertensive emergency on admission. Placed on clevedipine gtt while in ICU.Marland Kitchen Secondary work up shows no renal artery stenosis and normal thyroid function panel.   Home regimen of clonidine, furosemide, losartan, hydrochlorothiazide and metoprolol which have all been reordered.  Amlodipine added this admission -Continue holding hydrochlorothiazide, furosemide and losartan  - Plasma renin activity greater than 19000, aldosterone- negative,  and metanephrines pending. Catecholamines negative, cortisol 7.4 -Blood pressure remains elevated, 204/58.  - Primary team has restarted Losartan    LOS: 12   5/28/202412:56 PM

## 2023-03-20 NOTE — Progress Notes (Addendum)
PROGRESS NOTE    Karina Leonard  ZOX:[id number] DOB: 02/18/1941 DOA: 03/08/2023 PCP: Duanne Limerick, MD  Outpatient Specialists: vascular surgery, cardiology    Brief Narrative:   This is an 82 yo female with carotid artery stenosis who presented for an elective left carotid endarterectomy per vascular surgery on 05/16. However, procedure canceled due to severe hypertension sbp 200's. Pt subsequently admitted to the stepdown unit per vascular surgery and nitroglycerin gtt initiated for bp control. Pt also restarted on her outpatient antihypertensives. However, she remained hypertensive and nicardipine gtt added. Pt later developed symptomatic hypotension sbp 90's to low 100's. According to pts daughters pts baseline sbp 140-160's. PCCM team consulted and levophed gtt initiated.  05/16: Pt initially admitted to the stepdown unit with hypertensive urgency and later developed symptomatic hypotension secondary to antihypertensive medication requiring levophed gtt PCCM team consulted to assist with management  05/18: Pt had another episode of accelerated hypertension we have reviewed            medications, cleviprex gtt started overnight. Patient seems to have developed hydralazine        tolerance with rebound effect. Have increased HCTZ to 50 from 25.  Have met with        cardiology - Dr Juliann Pares , amlodipine 5 bid has been initiated. Will consider additional        therapy as needed while weaning down cleviprex gtt.  Her Chronic renal impairment        seems to have acute component with oliguric aki, will get nephrology to evaluate.  05/19: Pt in no distress this am. BP still elevated and still requires cleviprex titration with the PO regimen. Nephrology restarted clonidine due to concern of rebound hypertension following discontinuation of outpatient clonidine patch  05/20: Cleviprex gtt currently off.  SBP improved significantly now 130-150.  Pt now with increasing troponin [phone]  Cardiology following and aware 05/20: MRA Head/Neck: multiple scattered bilateral small acute infarcts. The largest        lesions are located in the right cerebellum, left temporal lobe and left basal ganglia. No        hemorrhage or mass effect. Loss of flow related enhancement within the left vertebral        artery V4 segment, consistent with slow flow or occlusion. Signal loss of the distal right        common carotid artery and left internal carotid artery near the carotid bifurcation. This        may be exaggerated by motion artifact, but there is likely a component of stenosis or        occlusion. CTA of the neck may be helpful for further characterization. Multiple chronic        microhemorrhages in a predominantly central distribution, likely due to chronic        hypertensive microangiopathy. 05/21: Code stroke initiated on 05/20 due to new onset right-sided facial droop and RUE        weakness.  CT Head revealed new area of hypoattenuation in the left inferior temporal        gyrus, concerning for acute infarct. No acute intracranial hemorrhage. ASPECT score is 10.   Assessment & Plan:   Principal Problem:   Malignant hypertension Active Problems:   Coronary artery disease   Carotid stenosis   Diabetes mellitus without complication (HCC)   Acute CVA (cerebrovascular accident) (HCC)   AKI (acute kidney injury) (HCC)  # Hypertensive emergency # Chronic hypertension Now  with rebound hypertension after clonidine was discontinued. BPs improving but still elevated. Cardiology manages her htn as outpt. Developed symptomatic hypotension on nicardipine gtt. With widened pulse pressure - arterial wall stiffness likely confounds BPs to some extent. Systolics unfortunately still above goal. Hyperaldo labs negative, normal catecholamine - amlodipine 10 - continue clonidine patch  - added hydral on 5/24, dose increased now to 50 - adding losartan 50 today after discussion w/  nephrology - vascular advising further outpatient CTA to eval for RAS - home hctz, lasix on hold 2/2 aki but may be able to add back soon - f/u metanephrines  # Debility TOC working on SNF  # CVA, acute Multiple. Neurology has signed off.  - continue aspirin/statin - endarterectomy planning as below - pt/ot advising SNF, TOC aware  # Sinus arrhythmia - cardiology advising 30-day event monitor at discharge  # AKI on ckd 3a Baseline cr of 1.17, here peaked at 3s, today 1.53, UOP adequate. Nephrology following.   # Carotid stenosis Vascular advising close outpatient f/u 1-2 weeks to make plans for endarterectomy - continue aspirin/plavix  # Demand ischemia vs nstemi Troponin peak in the 3000s. Currently asymptomatic. Cardiology has signed off, they are deferring additional diagnostics this hospitalization - cont asa/statin - close outpt f/u  # Constipation # Loose stool Constipation resolved, loose stools now with bowel regimen - bowel regimen on hold    DVT prophylaxis: heparin Code Status: full Family Communication: daughter updated telephonically 5/28  Level of care: Telemetry Medical Status is: Inpatient Remains inpatient appropriate because: ongoing bp mgmt    Consultants:  Nephrology, cardiology, vascular surgery, neurology  Procedures: none  Antimicrobials:  S/p cipro    Subjective: Feeling well, no complaints, tolerating diet  Objective: Vitals:   03/20/23 0429 03/20/23 0616 03/20/23 0750 03/20/23 0813  BP: (!) 170/63 (!) 199/57 (!) 184/68 (!) 204/58  Pulse: 68 65 63   Resp: 16  17   Temp: 97.9 F (36.6 C) 97.9 F (36.6 C) 97.7 F (36.5 C)   TempSrc:   Oral   SpO2: 98% 97% 98%   Weight:      Height:        Intake/Output Summary (Last 24 hours) at 03/20/2023 1610 Last data filed at 03/19/2023 1933 Gross per 24 hour  Intake 480 ml  Output --  Net 480 ml   Filed Weights   03/08/23 0626 03/08/23 0841  Weight: 87.5 kg 88.1 kg     Examination:  General exam: Appears calm and comfortable  Respiratory system: normal respiratory rate and work of breathing Cardiovascular system: RRR, distant heart sounds Gastrointestinal system: deferred Central nervous system: Alert and oriented. No focal neurological deficits. Extremities: warm, no edema Skin: No visible rashes, lesions or ulcers Psychiatry: Judgement and insight appear normal. Mood & affect appropriate.     Data Reviewed: I have personally reviewed following labs and imaging studies  CBC: Recent Labs  Lab 03/14/23 0519 03/20/23 0427  WBC 20.3* 13.4*  NEUTROABS 18.1*  --   HGB 11.0* 10.4*  HCT 32.9* 32.4*  MCV 92.7 95.0  PLT 217 318   Basic Metabolic Panel: Recent Labs  Lab 03/14/23 0519 03/15/23 0654 03/16/23 0540 03/17/23 0538 03/18/23 0424 03/19/23 0509 03/20/23 0427  NA 133* 133* 134* 136 139 140 142  K 4.3 4.1 4.0 3.6 3.7 3.4* 4.4  CL 101 101 103 104 107 108 111  CO2 23 21* 20* 22 24 24  21*  GLUCOSE 156* 123* 124* 119* 116* 132* 139*  BUN 77* 83* 94* 91* 79* 62* 54*  CREATININE 2.54* 2.92* 2.77* 2.25* 1.65* 1.53* 1.53*  CALCIUM 8.2* 7.9* 8.2* 8.4* 8.5* 8.4* 8.7*  MG 2.2 2.2  --   --   --   --  2.5*  PHOS 4.4  --   --   --   --   --   --    GFR: Estimated Creatinine Clearance: 33.4 mL/min (A) (by C-G formula based on SCr of 1.53 mg/dL (H)). Liver Function Tests: No results for input(s): "AST", "ALT", "ALKPHOS", "BILITOT", "PROT", "ALBUMIN" in the last 168 hours.  No results for input(s): "LIPASE", "AMYLASE" in the last 168 hours. No results for input(s): "AMMONIA" in the last 168 hours. Coagulation Profile: No results for input(s): "INR", "PROTIME" in the last 168 hours. Cardiac Enzymes: No results for input(s): "CKTOTAL", "CKMB", "CKMBINDEX", "TROPONINI" in the last 168 hours. BNP (last 3 results) No results for input(s): "PROBNP" in the last 8760 hours. HbA1C: No results for input(s): "HGBA1C" in the last 72  hours. CBG: Recent Labs  Lab 03/19/23 0904 03/19/23 1214 03/19/23 1712 03/19/23 2034 03/20/23 0747  GLUCAP 154* 95 109* 129* 133*   Lipid Profile: No results for input(s): "CHOL", "HDL", "LDLCALC", "TRIG", "CHOLHDL", "LDLDIRECT" in the last 72 hours.  Thyroid Function Tests: No results for input(s): "TSH", "T4TOTAL", "FREET4", "T3FREE", "THYROIDAB" in the last 72 hours. Anemia Panel: No results for input(s): "VITAMINB12", "FOLATE", "FERRITIN", "TIBC", "IRON", "RETICCTPCT" in the last 72 hours. Urine analysis:    Component Value Date/Time   COLORURINE YELLOW (A) 03/11/2023 1637   APPEARANCEUR HAZY (A) 03/11/2023 1637   LABSPEC 1.011 03/11/2023 1637   PHURINE 5.0 03/11/2023 1637   GLUCOSEU NEGATIVE 03/11/2023 1637   HGBUR SMALL (A) 03/11/2023 1637   BILIRUBINUR NEGATIVE 03/11/2023 1637   BILIRUBINUR negative 01/21/2020 1202   KETONESUR NEGATIVE 03/11/2023 1637   PROTEINUR 30 (A) 03/11/2023 1637   UROBILINOGEN 0.2 01/21/2020 1202   NITRITE NEGATIVE 03/11/2023 1637   LEUKOCYTESUR LARGE (A) 03/11/2023 1637   Sepsis Labs: @LABRCNTIP (procalcitonin:4,lacticidven:4)  ) Recent Results (from the past 240 hour(s))  Urine Culture (for pregnant, neutropenic or urologic patients or patients with an indwelling urinary catheter)     Status: Abnormal   Collection Time: 03/12/23  8:11 AM   Specimen: Urine, Random  Result Value Ref Range Status   Specimen Description   Final    URINE, RANDOM Performed at [facility], [address], [place], [place] [address]    Special Requests   Final    NONE Performed at [facility], [address], [place], [place] [address]    Culture (A)  Final    <10,000 COLONIES/mL INSIGNIFICANT GROWTH Performed at [facility], [address], [place], [place] [address]    Report Status 03/13/2023 FINAL  Final         Radiology Studies: No results found.      Scheduled Meds:  amLODipine  10 mg Oral Daily    aspirin EC  81 mg Oral Daily   Chlorhexidine Gluconate Cloth  6 each Topical Daily   cloNIDine  0.2 mg Transdermal Weekly   fluticasone  1 spray Each Nare Daily   heparin  5,000 Units Subcutaneous Q8H   hydrALAZINE  50 mg Oral Q8H   insulin aspart  0-5 Units Subcutaneous QHS   insulin aspart  0-9 Units Subcutaneous TID WC   loratadine  10 mg Oral Daily   losartan  50 mg Oral Daily  pantoprazole  40 mg Oral Daily   pravastatin  40 mg Oral q1800   venlafaxine XR  75 mg Oral QHS   Continuous Infusions:  sodium chloride Stopped (03/09/23 1239)     LOS: 12 days     Silvano Bilis, MD [facility]   If 7PM-7AM, please contact night-coverage [email or web address] Password [email or web address] 03/20/2023, 9:22 AM

## 2023-03-20 NOTE — Progress Notes (Signed)
   03/20/23 0813  Assess: MEWS Score  BP (!) 204/58 (RN Lundon Rosier Notified)  MAP (mmHg) 99  Assess: MEWS Score  MEWS Temp 0  MEWS Systolic 2  MEWS Pulse 0  MEWS RR 0  MEWS LOC 1  MEWS Score 3  MEWS Score Color Yellow  Assess: if the MEWS score is Yellow or Red  Were vital signs taken at a resting state? Yes  Focused Assessment Change from prior assessment (see assessment flowsheet)  Does the patient meet 2 or more of the SIRS criteria? No  MEWS guidelines implemented  Yes, yellow  Treat  MEWS Interventions Considered administering scheduled or prn medications/treatments as ordered  Take Vital Signs  Increase Vital Sign Frequency  Yellow: Q2hr x1, continue Q4hrs until patient remains green for 12hrs  Escalate  MEWS: Escalate Yellow: Discuss with charge nurse and consider notifying provider and/or RRT  Notify: Charge Nurse/RN  Name of Charge Nurse/RN Notified Recruitment consultant  Provider Notification  Provider Name/Title DR Ashok Pall  Date Provider Notified 03/20/23  Time Provider Notified [phone]  Provider response Other (Comment) (PRN order)  Date of Provider Response 03/20/23  Time of Provider Response [phone]

## 2023-03-20 NOTE — TOC Progression Note (Signed)
Transition of Care [facility]) - Progression Note    Patient Details  Name: Karina Leonard MRN: [id number] Date of Birth: 1941/06/04  [facility] CM/SW Contact  Garret Reddish, RN Phone Number: 03/20/2023, 1:52 PM  Clinical Narrative:  Chart reviewed.  I have received a bed offer from [facility] in [place].  I have spoken with Annabelle Harman with [facility].  Her contact number is [phone].  I have also spoken with Admission staff at [facility].  Contact number is [phone].  [facility] is also able to make a bed offer.    I have informed patient's daughter Duwayne Heck and she reports that she would like to tour both facilities before making a selection.  I have made her aware that patient is ready for discharge and that I would need to know by tomorrow. I have made Danielle aware that patient will also require SNF insurance approval prior to going to the SNF.    TOC will continue to follow for discharge planning.     Expected Discharge Plan: Home w Home Health Services Barriers to Discharge: Continued Medical Work up  Expected Discharge Plan and Services       Living arrangements for the past 2 months: Single Family Home                                       Social Determinants of Health (SDOH) Interventions SDOH Screenings   Food Insecurity: No Food Insecurity (03/08/2023)  Housing: Low Risk  (03/08/2023)  Transportation Needs: No Transportation Needs (03/08/2023)  Utilities: Not At Risk (03/08/2023)  Alcohol Screen: Low Risk  (03/22/2022)  Depression (PHQ2-9): Low Risk  (02/02/2023)  Financial Resource Strain: Low Risk  (03/22/2022)  Physical Activity: Insufficiently Active (03/22/2022)  Social Connections: Moderately Isolated (03/22/2022)  Stress: No Stress Concern Present (03/22/2022)  Tobacco Use: Medium Risk (03/16/2023)    Readmission Risk Interventions     No data to display

## 2023-03-21 DIAGNOSIS — I639 Cerebral infarction, unspecified: Secondary | ICD-10-CM | POA: Diagnosis not present

## 2023-03-21 DIAGNOSIS — I10 Essential (primary) hypertension: Secondary | ICD-10-CM | POA: Diagnosis not present

## 2023-03-21 DIAGNOSIS — N179 Acute kidney failure, unspecified: Secondary | ICD-10-CM | POA: Diagnosis not present

## 2023-03-21 LAB — MAGNESIUM: Magnesium: 2.4 mg/dL (ref 1.7–2.4)

## 2023-03-21 LAB — BASIC METABOLIC PANEL
Anion gap: 9 (ref 5–15)
BUN: 54 mg/dL — ABNORMAL HIGH (ref 8–23)
CO2: 23 mmol/L (ref 22–32)
Calcium: 8.5 mg/dL — ABNORMAL LOW (ref 8.9–10.3)
Chloride: 108 mmol/L (ref 98–111)
Creatinine, Ser: 1.84 mg/dL — ABNORMAL HIGH (ref 0.44–1.00)
GFR, Estimated: 27 mL/min — ABNORMAL LOW (ref >60)
Glucose, Bld: 118 mg/dL — ABNORMAL HIGH (ref 70–99)
Potassium: 4.2 mmol/L (ref 3.5–5.1)
Sodium: 140 mmol/L (ref 135–145)

## 2023-03-21 LAB — GLUCOSE, CAPILLARY
Glucose-Capillary: 114 mg/dL — ABNORMAL HIGH (ref 70–99)
Glucose-Capillary: 115 mg/dL — ABNORMAL HIGH (ref 70–99)
Glucose-Capillary: 87 mg/dL (ref 70–99)
Glucose-Capillary: 98 mg/dL (ref 70–99)

## 2023-03-21 LAB — CBC
HCT: 30.1 % — ABNORMAL LOW (ref 36.0–46.0)
Hemoglobin: 9.5 g/dL — ABNORMAL LOW (ref 12.0–15.0)
MCH: 29.9 pg (ref 26.0–34.0)
MCHC: 31.6 g/dL (ref 30.0–36.0)
MCV: 94.7 fL (ref 80.0–100.0)
Platelets: 289 10*3/uL (ref 150–400)
RBC: 3.18 MIL/uL — ABNORMAL LOW (ref 3.87–5.11)
RDW: 14.4 % (ref 11.5–15.5)
WBC: 13 10*3/uL — ABNORMAL HIGH (ref 4.0–10.5)
nRBC: 0 % (ref 0.0–0.2)

## 2023-03-21 MED ORDER — APIXABAN 2.5 MG PO TABS
2.5000 mg | ORAL_TABLET | Freq: Two times a day (BID) | ORAL | Status: DC
  Administered 2023-03-21 – 2023-03-22 (×2): 2.5 mg via ORAL
  Filled 2023-03-21 (×2): qty 1

## 2023-03-21 MED ORDER — HYDRALAZINE HCL 50 MG PO TABS
50.0000 mg | ORAL_TABLET | Freq: Three times a day (TID) | ORAL | Status: DC
  Administered 2023-03-21 – 2023-03-22 (×3): 50 mg via ORAL
  Filled 2023-03-21 (×3): qty 1

## 2023-03-21 MED ORDER — HYDRALAZINE HCL 50 MG PO TABS: 75.0000 mg | ORAL_TABLET | Freq: Three times a day (TID) | ORAL | Status: DC

## 2023-03-21 MED ORDER — ISOSORBIDE MONONITRATE ER 30 MG PO TB24
30.0000 mg | ORAL_TABLET | Freq: Once | ORAL | Status: AC
  Administered 2023-03-21: 30 mg via ORAL
  Filled 2023-03-21: qty 1

## 2023-03-21 NOTE — Progress Notes (Signed)
[facility] CLINIC CARDIOLOGY CONSULT NOTE       Patient ID: Karina Leonard MRN: [id number] DOB/AGE: [birth year] 82 y.o.  Admit date: 03/08/2023 Referring Physician Zada Girt, NP  Primary Physician Dr. Elizabeth Sauer Primary Cardiologist Dr. Juliann Pares Reason for Consultation paroxysmal AF  HPI: Karina Leonard is an 82yoF with a PMH of HTN, bilateral carotid stenosis (70% RICA, 80% LICA), hx CVA/TIA, HFpEF (55%), abnormal lexiscan myoview (01/08/23), sinus arrhythmia who presented to [facility] for elective left carotid endarterectomy with vascular surgery. The patient's BP was extremely elevated and procedure was cancelled and was admitted for BP control.  Patient's hospital course was complicated by chronic hypertension and adjustment of antihypertensive agents and acute CVA concerning for a central embolic source, and pending insurance authorization for SNF/rehab.  30-day event monitor was placed on 5/23 for further evaluation of arrhythmias/atrial fibrillation, which did detect paroxysmal atrial fibrillation.   Interval History:  - seen and examined this afternoon, no family present - sleeping soundly upon my entry to the room but awakens easily to voice - feels fatigued but has no specific complaints. No chest pain, lightheadedness, dizziness.  - pending insurance auth for SNF  Review of systems complete and found to be negative unless listed above    Past Medical History:  Diagnosis Date   Allergy    Aortic atherosclerosis (HCC)    Atrial fibrillation with slow ventricular response (HCC) 02/27/2023   a.) noted on preoperative ECG 02/27/2023; A.fib at 52 bpm with LVH and (+) inferolateral TWIs; b.) CHA2DS2VASc = 7 (age x 2, sex, HTN, CVA/TIA x2, vascular disease history); c.) rate/rhythm maintained on oral metoprolol; chronically antithrombotic therapy (ASA + clopidogrel)   Bilateral carotid artery disease (HCC)    a.) doppler 01/25/2023: 60% RICA, 70% LICA   Bradycardia     Cataract cortical, senile    Cerebrovascular disease    Coronary artery disease involving native coronary artery of native heart without angina pectoris    a.) MPI 05/24/2020: EF 45-50%. Borderline ant/lat defect with border zone redistribution; b.) MPI 01/08/2023: EF 45%; mild-mod mixed ant apical defect c/w infarct vs. ischemia   DDD (degenerative disc disease), lumbar    Depression    Diastolic dysfunction    a.) TTE 01/08/2023: EF >55%, mod LVH, sev LAE, mild MR/TR/PR, G1DD   Full dentures    Hyperlipidemia    Hypertension    Keloid    Lipoma    Long term current use of antithrombotics/antiplatelets    a.) on DAPT (ASA + clopidogrel)   Mild cognitive impairment    Pre-diabetes    Sciatica    Stroke [facility])    a.) MRI brain 03/19/2020: multiple old small vessel infarcts of the basal ganglia, thalamus and pons   TIA (transient ischemic attack)    Urinary incontinence     Past Surgical History:  Procedure Laterality Date   CHOLECYSTECTOMY  2009   COLONOSCOPY     RECTAL POLYPECTOMY     benign    Medications Prior to Admission  Medication Sig Dispense Refill Last Dose   aspirin EC 81 MG tablet Take 81 mg by mouth daily. Take while off of the plavix for surgey   03/07/2023   cloNIDine (CATAPRES - DOSED IN MG/24 HR) 0.2 mg/24hr patch Place 0.2 mg onto the skin once a week. Sunday   Past Week   fexofenadine (ALLEGRA) 180 MG tablet Take 1 tablet (180 mg total) by mouth daily. otc 90 tablet 1 Past Week  fluticasone (FLONASE) 50 MCG/ACT nasal spray Place 1 spray into both nostrils daily. otc   Past Week   furosemide (LASIX) 20 MG tablet Take 20 mg by mouth daily. callwood   Past Week   losartan-hydrochlorothiazide (HYZAAR) 100-25 MG tablet Take 1 tablet by mouth daily. 90 tablet 1 03/07/2023   lovastatin (MEVACOR) 20 MG tablet TAKE 2 TABLETS EVERY DAY (Patient taking differently: Take 2 tablets by mouth at bedtime. TAKE 2 TABLETS EVERY DAY) 180 tablet 1 03/07/2023   metoprolol succinate  (TOPROL-XL) 50 MG 24 hr tablet Take 1 tablet (50 mg total) by mouth daily. [place]wood 90 tablet 0 03/08/2023 at 0500   venlafaxine XR (EFFEXOR-XR) 75 MG 24 hr capsule Take 1 capsule (75 mg total) by mouth daily. (Patient taking differently: Take 75 mg by mouth at bedtime.) 90 capsule 1 03/07/2023   clopidogrel (PLAVIX) 75 MG tablet Take 1 tablet (75 mg total) by mouth daily. 90 tablet 1 03/01/2023   Social History   Socioeconomic History   Marital status: Widowed    Spouse name: Not on file   Number of children: 3   Years of education: some college   Highest education level: 12th grade  Occupational History   Occupation: Retired  Tobacco Use   Smoking status: Former    Packs/day: 1.00    Years: 20.00    Additional pack years: 0.00    Total pack years: 20.00    Types: Cigarettes    Quit date: 99    Years since quitting: 57.4   Smokeless tobacco: Never  Vaping Use   Vaping Use: Never used  Substance and Sexual Activity   Alcohol use: Yes    Alcohol/week: 0.0 standard drinks of alcohol    Comment: special occasions   Drug use: No   Sexual activity: Never  Other Topics Concern   Not on file  Social History Narrative   Lives alone   Social Determinants of Health   Financial Resource Strain: Low Risk  (03/22/2022)   Overall Financial Resource Strain (CARDIA)    Difficulty of Paying Living Expenses: Not hard at all  Food Insecurity: No Food Insecurity (03/08/2023)   Hunger Vital Sign    Worried About Running Out of Food in the Last Year: Never true    Ran Out of Food in the Last Year: Never true  Transportation Needs: No Transportation Needs (03/08/2023)   PRAPARE - Administrator, Civil Service (Medical): No    Lack of Transportation (Non-Medical): No  Physical Activity: Insufficiently Active (03/22/2022)   Exercise Vital Sign    Days of Exercise per Week: 3 days    Minutes of Exercise per Session: 30 min  Stress: No Stress Concern Present (03/22/2022)   [employer] of Occupational Health - Occupational Stress Questionnaire    Feeling of Stress : Not at all  Social Connections: Moderately Isolated (03/22/2022)   Social Connection and Isolation Panel [NHANES]    Frequency of Communication with Friends and Family: More than three times a week    Frequency of Social Gatherings with Friends and Family: Twice a week    Attends Religious Services: More than 4 times per year    Active Member of [employer] or Organizations: No    Attends Banker Meetings: Never    Marital Status: Widowed  Intimate Partner Violence: Not At Risk (03/08/2023)   Humiliation, Afraid, Rape, and Kick questionnaire    Fear of Current or Ex-Partner: No    Emotionally  Abused: No    Physically Abused: No    Sexually Abused: No    Family History  Problem Relation Age of Onset   Cancer Mother        uterine or ovarian. Unable to remember   Cancer Father        lung   Heart attack Sister       Intake/Output Summary (Last 24 hours) at 03/21/2023 1509 Last data filed at 03/21/2023 1050 Gross per 24 hour  Intake 360 ml  Output --  Net 360 ml     Vitals:   03/21/23 0020 03/21/23 0442 03/21/23 0746 03/21/23 1154  BP: (!) 167/51 (!) 157/48 (!) 186/42 (!) 175/47  Pulse: (!) 58 (!) 57 (!) 47 (!) 55  Resp: 18 18 16 18   Temp: 97.9 F (36.6 C) 97.6 F (36.4 C) (!) 97.5 F (36.4 C) 97.7 F (36.5 C)  TempSrc: Oral Oral Oral   SpO2: 97% 98% 98% 99%  Weight:      Height:        PHYSICAL EXAM General: Pleasant elderly black female, well nourished, in no acute distress.  Laying nearly flat in bed.  HEENT:  Normocephalic and atraumatic. Neck:  No JVD.  Lungs: Normal respiratory effort on room air.  Heart: RRR. Abdomen: Non-distended appearing.  Msk: Normal strength and tone for age.   Extremities: Warm and well perfused. No clubbing, cyanosis.  Neuro: Alert and oriented X 3. No residual facial drooping, weakness.  Psych:  Answers questions appropriately.    Labs: Basic Metabolic Panel: Recent Labs    03/20/23 0427 03/21/23 0345 03/21/23 0832  NA 142  --  140  K 4.4  --  4.2  CL 111  --  108  CO2 21*  --  23  GLUCOSE 139*  --  118*  BUN 54*  --  54*  CREATININE 1.53*  --  1.84*  CALCIUM 8.7*  --  8.5*  MG 2.5* 2.4  --     Liver Function Tests: No results for input(s): "AST", "ALT", "ALKPHOS", "BILITOT", "PROT", "ALBUMIN" in the last 72 hours.   No results for input(s): "LIPASE", "AMYLASE" in the last 72 hours. CBC: Recent Labs    03/20/23 0427 03/21/23 0345  WBC 13.4* 13.0*  HGB 10.4* 9.5*  HCT 32.4* 30.1*  MCV 95.0 94.7  PLT 318 289    Cardiac Enzymes: No results for input(s): "CKTOTAL", "CKMB", "CKMBINDEX", "TROPONINIHS" in the last 72 hours.  BNP: No results for input(s): "BNP" in the last 72 hours.  D-Dimer: No results for input(s): "DDIMER" in the last 72 hours. Hemoglobin A1C: No results for input(s): "HGBA1C" in the last 72 hours.  Fasting Lipid Panel: No results for input(s): "CHOL", "HDL", "LDLCALC", "TRIG", "CHOLHDL", "LDLDIRECT" in the last 72 hours.  Thyroid Function Tests: No results for input(s): "TSH", "T4TOTAL", "T3FREE", "THYROIDAB" in the last 72 hours.  Invalid input(s): "FREET3"  Anemia Panel: No results for input(s): "VITAMINB12", "FOLATE", "FERRITIN", "TIBC", "IRON", "RETICCTPCT" in the last 72 hours.   Radiology: US Carotid Bilateral  Result Date: 03/13/2023 CLINICAL DATA:  Carotid stenosis EXAM: BILATERAL CAROTID DUPLEX ULTRASOUND TECHNIQUE: Wallace Cullens scale imaging, color Doppler and duplex ultrasound were performed of bilateral carotid and vertebral arteries in the neck. COMPARISON:  None Available. FINDINGS: Criteria: Quantification of carotid stenosis is based on velocity parameters that correlate the residual internal carotid diameter with NASCET-based stenosis levels, using the diameter of the distal internal carotid lumen as the denominator for stenosis measurement. The following  velocity measurements were obtained: RIGHT ICA: 116/16 cm/sec CCA: 121/19 cm/sec SYSTOLIC ICA/CCA RATIO:  1.0 ECA:  50 cm/sec LEFT ICA: 95/23 cm/sec CCA: 218/27 cm/sec SYSTOLIC ICA/CCA RATIO:  0.4 ECA:  105 cm/sec RIGHT CAROTID ARTERY: Extensive calcified atherosclerotic plaque throughout the visualized carotid artery. By peak systolic velocity criteria, estimated stenosis is less than 50%. RIGHT VERTEBRAL ARTERY:  Patent with normal antegrade flow. LEFT CAROTID ARTERY: Calcified atherosclerotic plaque throughout the visualized carotid system. By peak systolic velocity criteria, the estimated stenosis is less than 50%. LEFT VERTEBRAL ARTERY:  Patent with normal antegrade flow. IMPRESSION: 1. Mild (1-49%) stenosis proximal right internal carotid artery secondary to extensive echogenic/calcified atherosclerotic plaque. 2. Mild (1-49%) stenosis proximal left internal carotid artery secondary to extensive echogenic/calcified atherosclerotic plaque. 3. Vertebral arteries are patent with normal antegrade flow. Electronically Signed   By: Malachy Moan M.D.   On: 03/13/2023 15:15   ECHOCARDIOGRAM COMPLETE  Result Date: 03/13/2023    ECHOCARDIOGRAM REPORT   Patient Name:   Karina Leonard Date of Exam: 03/13/2023 Medical Rec #:  [id number]       Height:       67.0 in Accession #:    [id number]      Weight:       194.2 lb Date of Birth:  10/14/1941       BSA:          1.997 m Patient Age:    82 years        BP:           165/44 mmHg Patient Gender: F               HR:           46 bpm. Exam Location:  [facility] Procedure: 2D Echo, Cardiac Doppler and Color Doppler Indications:     Stroke  History:         Patient has no prior history of Echocardiogram examinations.                  CAD, Stroke; Risk Factors:Hypertension and Diabetes.  Sonographer:     Mikki Harbor Referring Phys:  [id number] Neldon Newport NELSON Diagnosing Phys: Yvonne Kendall MD IMPRESSIONS  1. Left ventricular ejection fraction, by estimation, is >75%. The  left ventricle has hyperdynamic function. The left ventricle has no regional wall motion abnormalities. There is severe left ventricular hypertrophy. Left ventricular diastolic parameters are indeterminate.  2. Right ventricular systolic function is moderately reduced. There is relative sparing of the apex that can be seen in the setting of acute cor pulmonale (e.g. acute pulmonary embolism; McConnell's sign). The right ventricular size is mildly enlarged. There is normal pulmonary artery systolic pressure.  3. Left atrial size was moderately dilated.  4. Right atrial size was moderately dilated.  5. The mitral valve is normal in structure. Trivial mitral valve regurgitation.  6. Tricuspid valve regurgitation is moderate.  7. The aortic valve is tricuspid. Aortic valve regurgitation is not visualized. No aortic stenosis is present.  8. The inferior vena cava is normal in size with greater than 50% respiratory variability, suggesting right atrial pressure of 3 mmHg. FINDINGS  Left Ventricle: Left ventricular ejection fraction, by estimation, is >75%. The left ventricle has hyperdynamic function. The left ventricle has no regional wall motion abnormalities. The left ventricular internal cavity size was normal in size. There is severe left ventricular hypertrophy. Left ventricular diastolic parameters are indeterminate. Right Ventricle: The right ventricular size is mildly enlarged.  No increase in right ventricular wall thickness. Right ventricular systolic function is moderately reduced. There is normal pulmonary artery systolic pressure. The tricuspid regurgitant velocity is 1.84 m/s, and with an assumed right atrial pressure of 3 mmHg, the estimated right ventricular systolic pressure is 16.5 mmHg. Left Atrium: Left atrial size was moderately dilated. Right Atrium: Right atrial size was moderately dilated. Pericardium: There is no evidence of pericardial effusion. Mitral Valve: The mitral valve is normal in  structure. Mild mitral annular calcification. Trivial mitral valve regurgitation. MV peak gradient, 2.6 mmHg. The mean mitral valve gradient is 0.0 mmHg. Tricuspid Valve: The tricuspid valve is normal in structure. Tricuspid valve regurgitation is moderate. Aortic Valve: The aortic valve is tricuspid. Aortic valve regurgitation is not visualized. No aortic stenosis is present. Aortic valve mean gradient measures 2.0 mmHg. Aortic valve peak gradient measures 5.1 mmHg. Aortic valve area, by VTI measures 2.18 cm. Pulmonic Valve: The pulmonic valve was normal in structure. Pulmonic valve regurgitation is trivial. Aorta: The aortic root is normal in size and structure. Pulmonary Artery: The pulmonary artery is of normal size. Venous: The inferior vena cava is normal in size with greater than 50% respiratory variability, suggesting right atrial pressure of 3 mmHg. IAS/Shunts: No atrial level shunt detected by color flow Doppler.  LEFT VENTRICLE PLAX 2D LVIDd:         4.10 cm LVIDs:         2.00 cm LV PW:         1.90 cm LV IVS:        1.90 cm LVOT diam:     2.00 cm LV SV:         61 LV SV Index:   31 LVOT Area:     3.14 cm  RIGHT VENTRICLE RV Basal diam:  4.05 cm RV Mid diam:    3.00 cm RV S prime:     5.01 cm/s LEFT ATRIUM              Index        RIGHT ATRIUM           Index LA diam:        4.60 cm  2.30 cm/m   RA Area:     27.10 cm LA Vol (A2C):   107.0 ml 53.57 ml/m  RA Volume:   83.70 ml  41.91 ml/m LA Vol (A4C):   103.0 ml 51.57 ml/m LA Biplane Vol: 108.0 ml 54.08 ml/m  AORTIC VALVE                    PULMONIC VALVE AV Area (Vmax):    2.46 cm     PV Vmax:       0.47 m/s AV Area (Vmean):   2.16 cm     PV Peak grad:  0.9 mmHg AV Area (VTI):     2.18 cm AV Vmax:           113.00 cm/s AV Vmean:          68.900 cm/s AV VTI:            0.281 m AV Peak Grad:      5.1 mmHg AV Mean Grad:      2.0 mmHg LVOT Vmax:         88.60 cm/s LVOT Vmean:        47.300 cm/s LVOT VTI:          0.195 m LVOT/AV VTI ratio: 0.69  AORTA Ao Root diam: 3.00 cm MITRAL VALVE               TRICUSPID VALVE MV Area (PHT): 3.17 cm    TR Peak grad:   13.5 mmHg MV Area VTI:   2.63 cm    TR Vmax:        184.00 cm/s MV Peak grad:  2.6 mmHg MV Mean grad:  0.0 mmHg    SHUNTS MV Vmax:       0.81 m/s    Systemic VTI:  0.20 m MV Vmean:      26.9 cm/s   Systemic Diam: 2.00 cm MV Decel Time: 239 msec MV E velocity: 70.30 cm/s Yvonne Kendall MD Electronically signed by Yvonne Kendall MD Signature Date/Time: 03/13/2023/1:12:28 PM    Final    MR BRAIN WO CONTRAST  Result Date: 03/12/2023 CLINICAL DATA:  Right facial droop EXAM: MRI HEAD WITHOUT CONTRAST MRA HEAD WITHOUT CONTRAST MRA NECK WITHOUT CONTRAST TECHNIQUE: Multiplanar, multiecho pulse sequences of the brain and surrounding structures were obtained without intravenous contrast. Angiographic images of the Circle of Willis were obtained using MRA technique without intravenous contrast. Angiographic images of the neck were obtained using MRA technique without intravenous contrast. Carotid stenosis measurements (when applicable) are obtained utilizing NASCET criteria, using the distal internal carotid diameter as the denominator. COMPARISON:  01/27/2023 FINDINGS: MRI HEAD FINDINGS Brain: Multiple scattered bilateral small acute infarcts. The largest lesions are located in the right cerebellum, left temporal lobe and left basal ganglia. Aside from the right cerebellar lesion, all lesions are on the left. Multiple chronic microhemorrhages in a predominantly central distribution. There is confluent hyperintense T2-weighted signal within the white matter. Generalized volume loss. Ex vacuo dilatation of the right lateral ventricle. The midline structures are normal. Vascular: Major flow voids are preserved. Skull and upper cervical spine: Normal calvarium and skull base. Visualized upper cervical spine and soft tissues are normal. Sinuses/Orbits:No paranasal sinus fluid levels or advanced mucosal thickening.  No mastoid or middle ear effusion. Normal orbits. MRA HEAD FINDINGS POSTERIOR CIRCULATION: --Vertebral arteries: Loss of flow related enhancement within the left V4 segment. Normal left PICA. --Inferior cerebellar arteries: Normal. --Basilar artery: Normal. --Superior cerebellar arteries: Normal. --Posterior cerebral arteries: Normal. The right PCA is predominantly supplied by the posterior communicating artery. ANTERIOR CIRCULATION: --Intracranial internal carotid arteries: Atherosclerotic irregularity of the internal carotid arteries at the skull base, left-greater-than-right. --Anterior cerebral arteries (ACA): Normal. --Middle cerebral arteries (MCA): Normal. MRA NECK FINDINGS Motion degraded MRA of the neck. Aortic arch: Unremarkable Right carotid system: There is signal loss of the distal common carotid artery. The right ICA is patent. Left carotid system: There is atherosclerotic irregularity throughout the left carotid system with an area of signal loss near the carotid bifurcation. Vertebral arteries: Right dominant.  No stenosis. IMPRESSION: 1. Multiple scattered bilateral small acute infarcts. The largest lesions are located in the right cerebellum, left temporal lobe and left basal ganglia. No hemorrhage or mass effect. 2. Loss of flow related enhancement within the left vertebral artery V4 segment, consistent with slow flow or occlusion. 3. Signal loss of the distal right common carotid artery and left internal carotid artery near the carotid bifurcation. This may be exaggerated by motion artifact, but there is likely a component of stenosis or occlusion. CTA of the neck may be helpful for further characterization. 4. 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 Electronically Signed   By: Chrisandra Netters.D.  On: 03/12/2023 21:48   MR ANGIO NECK WO CONTRAST  Result Date: 03/12/2023 CLINICAL DATA:  Right facial droop  EXAM: MRI HEAD WITHOUT CONTRAST MRA HEAD WITHOUT CONTRAST MRA NECK WITHOUT CONTRAST TECHNIQUE: Multiplanar, multiecho pulse sequences of the brain and surrounding structures were obtained without intravenous contrast. Angiographic images of the Circle of Willis were obtained using MRA technique without intravenous contrast. Angiographic images of the neck were obtained using MRA technique without intravenous contrast. Carotid stenosis measurements (when applicable) are obtained utilizing NASCET criteria, using the distal internal carotid diameter as the denominator. COMPARISON:  01/27/2023 FINDINGS: MRI HEAD FINDINGS Brain: Multiple scattered bilateral small acute infarcts. The largest lesions are located in the right cerebellum, left temporal lobe and left basal ganglia. Aside from the right cerebellar lesion, all lesions are on the left. Multiple chronic microhemorrhages in a predominantly central distribution. There is confluent hyperintense T2-weighted signal within the white matter. Generalized volume loss. Ex vacuo dilatation of the right lateral ventricle. The midline structures are normal. Vascular: Major flow voids are preserved. Skull and upper cervical spine: Normal calvarium and skull base. Visualized upper cervical spine and soft tissues are normal. Sinuses/Orbits:No paranasal sinus fluid levels or advanced mucosal thickening. No mastoid or middle ear effusion. Normal orbits. MRA HEAD FINDINGS POSTERIOR CIRCULATION: --Vertebral arteries: Loss of flow related enhancement within the left V4 segment. Normal left PICA. --Inferior cerebellar arteries: Normal. --Basilar artery: Normal. --Superior cerebellar arteries: Normal. --Posterior cerebral arteries: Normal. The right PCA is predominantly supplied by the posterior communicating artery. ANTERIOR CIRCULATION: --Intracranial internal carotid arteries: Atherosclerotic irregularity of the internal carotid arteries at the skull base, left-greater-than-right.  --Anterior cerebral arteries (ACA): Normal. --Middle cerebral arteries (MCA): Normal. MRA NECK FINDINGS Motion degraded MRA of the neck. Aortic arch: Unremarkable Right carotid system: There is signal loss of the distal common carotid artery. The right ICA is patent. Left carotid system: There is atherosclerotic irregularity throughout the left carotid system with an area of signal loss near the carotid bifurcation. Vertebral arteries: Right dominant.  No stenosis. IMPRESSION: 1. Multiple scattered bilateral small acute infarcts. The largest lesions are located in the right cerebellum, left temporal lobe and left basal ganglia. No hemorrhage or mass effect. 2. Loss of flow related enhancement within the left vertebral artery V4 segment, consistent with slow flow or occlusion. 3. Signal loss of the distal right common carotid artery and left internal carotid artery near the carotid bifurcation. This may be exaggerated by motion artifact, but there is likely a component of stenosis or occlusion. CTA of the neck may be helpful for further characterization. 4. 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 Electronically Signed   By: Deatra Robinson M.D.   On: 03/12/2023 21:48   MR ANGIO HEAD WO CONTRAST  Result Date: 03/12/2023 CLINICAL DATA:  Right facial droop EXAM: MRI HEAD WITHOUT CONTRAST MRA HEAD WITHOUT CONTRAST MRA NECK WITHOUT CONTRAST TECHNIQUE: Multiplanar, multiecho pulse sequences of the brain and surrounding structures were obtained without intravenous contrast. Angiographic images of the Circle of Willis were obtained using MRA technique without intravenous contrast. Angiographic images of the neck were obtained using MRA technique without intravenous contrast. Carotid stenosis measurements (when applicable) are obtained utilizing NASCET criteria, using the distal internal carotid diameter as the denominator. COMPARISON:   01/27/2023 FINDINGS: MRI HEAD FINDINGS Brain: Multiple scattered bilateral small acute infarcts. The largest lesions are located in the right cerebellum, left temporal lobe and left basal ganglia. Aside from the right  cerebellar lesion, all lesions are on the left. Multiple chronic microhemorrhages in a predominantly central distribution. There is confluent hyperintense T2-weighted signal within the white matter. Generalized volume loss. Ex vacuo dilatation of the right lateral ventricle. The midline structures are normal. Vascular: Major flow voids are preserved. Skull and upper cervical spine: Normal calvarium and skull base. Visualized upper cervical spine and soft tissues are normal. Sinuses/Orbits:No paranasal sinus fluid levels or advanced mucosal thickening. No mastoid or middle ear effusion. Normal orbits. MRA HEAD FINDINGS POSTERIOR CIRCULATION: --Vertebral arteries: Loss of flow related enhancement within the left V4 segment. Normal left PICA. --Inferior cerebellar arteries: Normal. --Basilar artery: Normal. --Superior cerebellar arteries: Normal. --Posterior cerebral arteries: Normal. The right PCA is predominantly supplied by the posterior communicating artery. ANTERIOR CIRCULATION: --Intracranial internal carotid arteries: Atherosclerotic irregularity of the internal carotid arteries at the skull base, left-greater-than-right. --Anterior cerebral arteries (ACA): Normal. --Middle cerebral arteries (MCA): Normal. MRA NECK FINDINGS Motion degraded MRA of the neck. Aortic arch: Unremarkable Right carotid system: There is signal loss of the distal common carotid artery. The right ICA is patent. Left carotid system: There is atherosclerotic irregularity throughout the left carotid system with an area of signal loss near the carotid bifurcation. Vertebral arteries: Right dominant.  No stenosis. IMPRESSION: 1. Multiple scattered bilateral small acute infarcts. The largest lesions are located in the right  cerebellum, left temporal lobe and left basal ganglia. No hemorrhage or mass effect. 2. Loss of flow related enhancement within the left vertebral artery V4 segment, consistent with slow flow or occlusion. 3. Signal loss of the distal right common carotid artery and left internal carotid artery near the carotid bifurcation. This may be exaggerated by motion artifact, but there is likely a component of stenosis or occlusion. CTA of the neck may be helpful for further characterization. 4. 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 Electronically Signed   By: Deatra Robinson M.D.   On: 03/12/2023 21:48   CT HEAD CODE STROKE WO CONTRAST  Result Date: 03/12/2023 CLINICAL DATA:  Code stroke. Neuro deficit, acute, stroke suspected. EXAM: CT HEAD WITHOUT CONTRAST TECHNIQUE: Contiguous axial images were obtained from the base of the skull through the vertex without intravenous contrast. RADIATION DOSE REDUCTION: This exam was performed according to the departmental dose-optimization program which includes automated exposure control, adjustment of the mA and/or kV according to patient size and/or use of iterative reconstruction technique. COMPARISON:  MRI brain 01/27/2023. FINDINGS: Brain: No acute intracranial hemorrhage. New area of hypoattenuation in the left inferior temporal gyrus. Unchanged encephalomalacia in the right posterior frontal lobe and basal ganglia from prior right MCA territory infarct. Unchanged old infarct in the right occipital lobe and severe chronic small-vessel disease. No hydrocephalus or extra-axial collection. No mass effect or midline shift. Vascular: No hyperdense vessel or unexpected calcification. Skull: No calvarial fracture or suspicious bone lesion. Skull base is unremarkable. Sinuses/Orbits: Unremarkable. Other: None. ASPECTS (Alberta Stroke Program Early CT Score) - Ganglionic level infarction (caudate,  lentiform nuclei, internal capsule, insula, M1-M3 cortex): 7 - Supraganglionic infarction (M4-M6 cortex): 3 Total score (0-10 with 10 being normal): 10 IMPRESSION: 1. New area of hypoattenuation in the left inferior temporal gyrus, concerning for acute infarct. No acute intracranial hemorrhage. 2. ASPECT score is 10. Code stroke imaging results were communicated on 03/12/2023 at 4:44 pm to provider Dr. Amada Jupiter via secure text paging. Electronically Signed   By: Orvan Falconer M.D.   On: 03/12/2023 16:45  US Venous Img Upper Uni Right(DVT)  Result Date: 03/12/2023 CLINICAL DATA:  Right upper extremity pain and edema. EXAM: RIGHT UPPER EXTREMITY VENOUS DOPPLER ULTRASOUND TECHNIQUE: Gray-scale sonography with graded compression, as well as color Doppler and duplex ultrasound were performed to evaluate the upper extremity deep venous system from the level of the subclavian vein and including the jugular, axillary, basilic, radial, ulnar and upper cephalic vein. Spectral Doppler was utilized to evaluate flow at rest and with distal augmentation maneuvers. COMPARISON:  None Available. FINDINGS: Contralateral Subclavian Vein: Respiratory phasicity is normal and symmetric with the symptomatic side. No evidence of thrombus. Normal compressibility. Internal Jugular Vein: No evidence of thrombus. Normal compressibility, respiratory phasicity and response to augmentation. Subclavian Vein: No evidence of thrombus. Normal compressibility, respiratory phasicity and response to augmentation. Axillary Vein: No evidence of thrombus. Normal compressibility, respiratory phasicity and response to augmentation. Cephalic Vein: Superficial thrombophlebitis of a segment of the right cephalic vein in the forearm with associated venous distension. Basilic Vein: No evidence of thrombus. Normal compressibility, respiratory phasicity and response to augmentation. Brachial Veins: No evidence of thrombus. Normal compressibility,  respiratory phasicity and response to augmentation. Radial Veins: No evidence of thrombus. Normal compressibility, respiratory phasicity and response to augmentation. Ulnar Veins: No evidence of thrombus. Normal compressibility, respiratory phasicity and response to augmentation. Venous Reflux:  None visualized. Other Findings:  No abnormal fluid collections identified. IMPRESSION: 1. No evidence of DVT within the right upper extremity. 2. Superficial thrombophlebitis of a segment of the right cephalic vein in the forearm. Electronically Signed   By: Irish Lack M.D.   On: 03/12/2023 12:24   US RENAL  Result Date: 03/11/2023 CLINICAL DATA:  6274 Acute kidney failure, unspecified (HCC) [id number] Chronic kidney disease (CKD) stage G3a/A1, moderately decreased glomerular filtration rate (GFR) between 45-59 mL/min EXAM: RENAL / URINARY TRACT ULTRASOUND COMPLETE COMPARISON:  03/08/2023 FINDINGS: Right Kidney: Renal measurements: 9.6 x 4.3 x 5.2 cm = volume: 109 mL. Parenchyma is hypoechoic compared to adjacent liver. 1.6 x 1.5 cm cortical cyst partially exophytic mid kidney. No hydronephrosis. Left Kidney: Renal measurements: 10.6 x 5.2 x 5.2 cm = volume: 152 mL. 2.3 x 1.7 x 2 cm simple cyst mid kidney. No hydronephrosis. Bladder: Physiologically distended. Other: None. IMPRESSION: 1. No hydronephrosis. 2. Bilateral simple renal cysts. Electronically Signed   By: Corlis Leak M.D.   On: 03/11/2023 16:20   US RENAL ARTERY DUPLEX LIMITED  Result Date: 03/08/2023 CLINICAL DATA:  Malignant hypertension EXAM: RENAL/URINARY TRACT ULTRASOUND RENAL DUPLEX DOPPLER ULTRASOUND COMPARISON:  CT 04/05/2008 FINDINGS: Right Kidney: Length: 9.8 x 4.8 x 5.3 cm (128 cc). Isoechoic to adjacent liver. 1.6 x 1.3 x 1.2 cm midpole cyst. No hydronephrosis. Left Kidney: Length: 10.6 x 5.5 x 4.7 cm (144 cc). No hydronephrosis. 2.3 x 2 cm lower pole cortical cyst. Bladder:  Not imaged RENAL DUPLEX ULTRASOUND Right Renal Artery  Velocities: Origin:  43.8 cm/sec Mid:  47.6 cm/sec Hilum:  43.2 cm/sec Left Renal Artery Velocities: Origin:  70.2 cm/sec Mid:  67.8 cm/sec Hilum:  67.7 cm/sec Aortic Velocity:  141 cm/sec All renal-aortic ratios are well under 1.0. Technologist describes technically difficult study secondary to bowel gas and patient inability to hold breath. IMPRESSION: 1. No Doppler ultrasound evidence of hemodynamically significant renal artery stenosis. 2. If there is continued clinical concern, renal MRA (lower radiation risk, can be performed noncontrast in the setting of renal dysfunction) and CTA ( higher spatial resolution) represent more accurate studies, which are additionally more  sensitive to the detection of duplicated renal arteries. Electronically Signed   By: Corlis Leak M.D.   On: 03/08/2023 18:26   DG Chest Port 1 View  Result Date: 03/08/2023 CLINICAL DATA:  Hypotension EXAM: PORTABLE CHEST 1 VIEW COMPARISON:  08/22/2007 FINDINGS: Single frontal view of the chest demonstrates an enlarged cardiac silhouette. There is atherosclerosis of the thoracic aorta, bilateral subclavian arteries, and bilateral carotid arteries. No acute airspace disease, effusion, or pneumothorax. No acute bony abnormalities. IMPRESSION: 1. Enlarged cardiac silhouette. 2. No acute airspace disease. 3. Extensive atherosclerosis. Electronically Signed   By: Sharlet Salina M.D.   On: 03/08/2023 14:53    ECHO  INTERPRETATION  NORMAL LEFT VENTRICULAR SYSTOLIC FUNCTION   WITH MODERATE LVH  NORMAL RIGHT VENTRICULAR SYSTOLIC FUNCTION  MILD VALVULAR REGURGITATION (See above)  NO VALVULAR STENOSIS  IRREGULAR HEART RHYTHM CAPTURED THROUGHOUT EXAM  ESTIMATED LVEF >55%  CALCULATED: 58.3%  GLS: -16.7%  MILD MR, TR, PI  SEVERE LAE  _________________________________________________________________________________________  Electronically signed by    Dorothyann Peng, MD on 01/08/2023 04: 59 PM           Performed By: Verdis Prime      Ordering Physician: Dorothyann Peng, MD   Steffanie Dunn 01/08/2023 Impression   Moderately abnormal myocardial perfusion scan there is evidence of left ventricular enlargement there is mild /moderate anterior apical defect of moderate intensity with mixed defect concerning for infarct versus ischemia.  Overall ejection fraction around 45% with anterior apical hypokinesis.  This is a moderate to high risk and recommend consider further evaluation invasively possibly cardiac cath  EKG reviewed by me: junctional brady with nonspecific IVCD and nonspecific T wave changes  Data reviewed by me 03/21/2023: Neurology note, nursing notes, nephrology and hospitalist progress notes, last 24h vitals tele labs imaging I/O   Principal Problem:   Malignant hypertension Active Problems:   Coronary artery disease   Carotid stenosis   Diabetes mellitus without complication (HCC)   Acute CVA (cerebrovascular accident) (HCC)   AKI (acute kidney injury) (HCC)    ASSESSMENT AND PLAN:  Karina Leonard is an 51yoF with a PMH of HTN, bilateral carotid stenosis (70% RICA, 80% LICA), hx CVA/TIA, HFpEF (55%), abnormal lexiscan myoview (01/08/23), sinus arrhythmia who presented to [facility] for elective left carotid endarterectomy with vascular surgery. The patient's BP was extremely elevated and procedure was cancelled and was admitted for BP control.  Patient's hospital course was complicated by chronic hypertension and adjustment of antihypertensive agents and acute CVA concerning for a central embolic source, and pending insurance authorization for SNF/rehab.  30-day event monitor was placed on 5/23 for further evaluation of arrhythmias/atrial fibrillation, which did detect paroxysmal atrial fibrillation.   # CVA # paroxysmal atrial fibrillation Code stroke called at 1630 on 5/20 for new onset R sided facial drooping and R arm weakness. CT positive, MRI demonstrated multiple bilateral small acute infarcts  with largest lesions in the R cerebellum, L temporal lobe, and L basal ganglia.  -Continue pravastatin 40 mg daily.  -S/p neurology consult.  -Repeat echo shows hyperdynamic LV function EF >75% -30-day event monitor placed on 5/23, I was notified by [facility] nursing staff today that paroxysmal atrial fibrillation was detected on the event monitor  -Keep monitor in place for 30 days to assess AF burden - discussed the risks and benefits of starting a DOAC vs warfarin for stroke prevention with the patient. She is agreeable to start dose reduced eliquis 2.5mg  BID for stroke prevention. Meets  criteria for reduced dosing with Cr. >1.5 and age >73. As below, BL creatinine 1.17, may need dose adjustment outpatient if Cr returns to baseline. - will arrange for outpatient follow up with Dr. Juliann Pares following discharge in 1-2 weeks.   # Hypertensive emergency # Chronic hypertension BP remains elevated/labile, undergoing titration of antihypertensive agents.   -Amlodipine 10 mg daily -Clonidine patch 0.2 mg once weekly -Losartan 50 mg daily -Hydralazine from 50 mg 3 times daily   # Left carotid artery stenosis Pending endarterectomy outpatient once BP under more optimal control per vascular surgery.  # CKD IIIA Suspected AKI  2/2 hypertensive emergency. Prior BL Cr. 1.17 and GFR 47. Cr today 1.84.  - nephrology following  This patient's plan of care was discussed and created with Dr. Darrold Junker and he is in agreement.  Signed: Rebeca Allegra , PA-C 03/21/2023, 3:09 PM [facility]

## 2023-03-21 NOTE — Plan of Care (Signed)
  Problem: Clinical Measurements: Goal: Respiratory complications will improve Outcome: Progressing   Problem: Clinical Measurements: Goal: Cardiovascular complication will be avoided Outcome: Progressing   Problem: Activity: Goal: Risk for activity intolerance will decrease Outcome: Progressing   Problem: Safety: Goal: Ability to remain free from injury will improve Outcome: Progressing   

## 2023-03-21 NOTE — Progress Notes (Signed)
Physical Therapy Treatment Patient Details Name: Karina Leonard MRN: [id number] DOB: 1941/04/02 Today's Date: 03/21/2023   History of Present Illness Pt is an 82 yo female with PMH that includes carotid artery stenosis, A-fib with slow ventricular response, bradycardia, CAD, DDD, depression, diastolic dysfunction, lipoma, HTN, pre-diabetes, TIA, and sciatica who presented for an elective left carotid endarterectomy per vascular surgery on 05/16.  However, procedure canceled due to severe hypertension sbp 200's.  Pt subsequently admitted to the stepdown. MD assessment includes: multiple bilateral small acute infarcts with largest lesions in the R cerebellum, L temporal lobe, and L basal ganglia, malignant hypertension, left carotid artery stenosis, junctional bradycardia, demand Ischemia vs ACS, and AKI on CKD IIIa.    PT Comments    Pt in recliner and agreeable to PT session. Patient continues to require assistance with mobility tasks. Able to complete bed mobility with supervision, and ambulate 90 ft with RW and CGA. Patient does require verbal cues for safety and sequencing of tasks. Pt able to stand approx 5-6 minutes at sink completing self care tasks. Patient continues to demo progress with PT services, and will continue to benefit from skilled acute PT services to return to [facility] and address impairments. Will continue to follow POC. Discharge recommendation remains appropriate.    Recommendations for follow up therapy are one component of a multi-disciplinary discharge planning process, led by the attending physician.  Recommendations may be updated based on patient status, additional functional criteria and insurance authorization.  Follow Up Recommendations  Can patient physically be transported by private vehicle: Yes    Assistance Recommended at Discharge Frequent or constant Supervision/Assistance  Patient can return home with the following A little help with walking and/or transfers;A  little help with bathing/dressing/bathroom;Assistance with cooking/housework;Direct supervision/assist for medications management;Help with stairs or ramp for entrance;Assist for transportation   Equipment Recommendations  Rolling walker (2 wheels);BSC/3in1    Recommendations for Other Services       Precautions / Restrictions Precautions Precautions: Fall Restrictions Weight Bearing Restrictions: No Other Position/Activity Restrictions: Watch HR     Mobility  Bed Mobility Overal bed mobility: Needs Assistance Bed Mobility: Sit to Supine       Sit to supine: Supervision   General bed mobility comments: Pt in recliner at start of session, requesting to end session in bed.    Transfers Overall transfer level: Needs assistance Equipment used: Rolling walker (2 wheels) Transfers: Sit to/from Stand Sit to Stand: Min guard           General transfer comment: from recliner, require CGA for steadying upon initial standing but improvements in balance with prolonged standing.    Ambulation/Gait Ambulation/Gait assistance: Min guard Gait Distance (Feet): 90 Feet Assistive device: Rolling walker (2 wheels) Gait Pattern/deviations: Step-to pattern, Decreased step length - left, Decreased weight shift to left Gait velocity: decreased     General Gait Details: Pt able to ambulate approx 90 ft before needing seated rest break. Short step length noted, and heavy UE use on RW. No buckling noted this session. Verbal cues for safety and proper use of AD.   Stairs             Wheelchair Mobility    Modified Rankin (Stroke Patients Only)       Balance Overall balance assessment: Needs assistance Sitting-balance support: No upper extremity supported, Feet supported Sitting balance-Leahy Scale: Good     Standing balance support: Reliant on assistive device for balance, During functional activity, Single extremity supported, Bilateral  upper extremity  supported Standing balance-Leahy Scale: Good Standing balance comment: Pt able to maintain standing balance with RW and with single UE support at sink with self care task                            Cognition Arousal/Alertness: Awake/alert Behavior During Therapy: [facility] for tasks assessed/performed Overall Cognitive Status: Impaired/Different from baseline                                 General Comments: Pt follows commands with increased time        Exercises Other Exercises Other Exercises: STS x 5 reps with UE working on eccentric control; LAQ and Alt Hip Flexion x 10 reps bilaterally Other Exercises: standing at sink with single to no UE support, patient engaged in self care task of cleaning dentures. supervision to CGA with completion    General Comments        Pertinent Vitals/Pain Pain Assessment Pain Assessment: No/denies pain    Home Living                          Prior Function            PT Goals (current goals can now be found in the care plan section) Acute Rehab PT Goals Patient Stated Goal: Improved balance PT Goal Formulation: With patient/family Time For Goal Achievement: 03/26/23 Potential to Achieve Goals: Good Progress towards PT goals: Progressing toward goals    Frequency    Min 4X/week      PT Plan Current plan remains appropriate    Co-evaluation              AM-PAC PT "6 Clicks" Mobility   Outcome Measure  Help needed turning from your back to your side while in a flat bed without using bedrails?: A Little Help needed moving from lying on your back to sitting on the side of a flat bed without using bedrails?: A Little Help needed moving to and from a bed to a chair (including a wheelchair)?: A Little Help needed standing up from a chair using your arms (e.g., wheelchair or bedside chair)?: A Little Help needed to walk in hospital room?: A Little Help needed climbing 3-5 steps with a  railing? : A Lot 6 Click Score: 17    End of Session Equipment Utilized During Treatment: Gait belt Activity Tolerance: Patient tolerated treatment well Patient left: in bed;with bed alarm set Nurse Communication: Mobility status PT Visit Diagnosis: Muscle weakness (generalized) (M62.81);Difficulty in walking, not elsewhere classified (R26.2)     Time: [id number] PT Time Calculation (min) (ACUTE ONLY): 30 min  Charges:  $Gait Training: 8-22 mins $Therapeutic Activity: 8-22 mins                     Creed Copper Fairly, PT, DPT 03/21/23 2:19 PM

## 2023-03-21 NOTE — Progress Notes (Signed)
PROGRESS NOTE    Karina Leonard  ZOX:[id number] DOB: October 04, 1941 DOA: 03/08/2023 PCP: Duanne Limerick, MD   Assessment & Plan:   Principal Problem:   Malignant hypertension Active Problems:   Coronary artery disease   Carotid stenosis   Diabetes mellitus without complication (HCC)   Acute CVA (cerebrovascular accident) (HCC)   AKI (acute kidney injury) (HCC)  Assessment and Plan: Hypertensive emergency: emergency resolved but still w/ HTN.   HTN: continue on amlodipine, clonidine, losartan, hydralazine. Hold lasix secondary to AKI on CKD.    Debility: PT/OT recs SNF    Acute CVA: continue on aspirin, statin. PT/OT recs SNF.    Sinus arrhythmia: needs a 30 day event monitor at d/c    AKI on CKDIIIa: baseline Cr 1.17. Cr is labile. Nephro following and recs apprec   Likely ACD: likely secondary to CKD. No need for a transfusion currently    Carotid stenosis: continue on aspirin, plavix. F/u w/ vasc surg outpatient in 1-2 weeks for endarterectomy    Demand ischemia: likely secondary to labile Bps. Unlikely NSTEMI. No further inpatient cardiac work-up as per cardio. Continue on aspirin, statin. Cardio signed off. Please see cardiac notes for more info  Constipation: resolved         DVT prophylaxis: heparin SQ Code Status: full  Family Communication:  discussed pt's care w/ pt's daughter, Duwayne Heck, and answered her questions  Disposition Plan: likely d/c to SNF   Level of care: Telemetry Medical  Status is: Inpatient Remains inpatient appropriate because: severity of illness, needs SNF bed as well     Consultants:  Cardio Nephro   Procedures:   Antimicrobials:    Subjective: Pt c/o fatigue   Objective: Vitals:   03/20/23 2020 03/21/23 0020 03/21/23 0442 03/21/23 0746  BP: (!) 148/70 (!) 167/51 (!) 157/48 (!) 186/42  Pulse: (!) 53 (!) 58 (!) 57 (!) 47  Resp: 17 18 18 16   Temp: (!) 97.5 F (36.4 C) 97.9 F (36.6 C) 97.6 F (36.4 C) (!) 97.5 F  (36.4 C)  TempSrc: Oral Oral Oral Oral  SpO2: 97% 97% 98% 98%  Weight:      Height:        Intake/Output Summary (Last 24 hours) at 03/21/2023 0803 Last data filed at 03/20/2023 1929 Gross per 24 hour  Intake 238 ml  Output --  Net 238 ml   Filed Weights   03/08/23 0626 03/08/23 0841  Weight: 87.5 kg 88.1 kg    Examination:  General exam: Appears calm and comfortable  Respiratory system: Clear to auscultation. Respiratory effort normal. Cardiovascular system: S1 & S2 +. No rubs, gallops or clicks. No pedal edema. Gastrointestinal system: Abdomen is nondistended, soft and nontender.  Normal bowel sounds heard. Central nervous system: Alert and oriented.  Psychiatry: Judgement and insight appear normal. Mood & affect appropriate.     Data Reviewed: I have personally reviewed following labs and imaging studies  CBC: Recent Labs  Lab 03/20/23 0427 03/21/23 0345  WBC 13.4* 13.0*  HGB 10.4* 9.5*  HCT 32.4* 30.1*  MCV 95.0 94.7  PLT 318 289   Basic Metabolic Panel: Recent Labs  Lab 03/15/23 0654 03/16/23 0540 03/17/23 0538 03/18/23 0424 03/19/23 0509 03/20/23 0427 03/21/23 0345  NA 133* 134* 136 139 140 142  --   K 4.1 4.0 3.6 3.7 3.4* 4.4  --   CL 101 103 104 107 108 111  --   CO2 21* 20* 22 24 24  21*  --  GLUCOSE 123* 124* 119* 116* 132* 139*  --   BUN 83* 94* 91* 79* 62* 54*  --   CREATININE 2.92* 2.77* 2.25* 1.65* 1.53* 1.53*  --   CALCIUM 7.9* 8.2* 8.4* 8.5* 8.4* 8.7*  --   MG 2.2  --   --   --   --  2.5* 2.4   GFR: Estimated Creatinine Clearance: 33.4 mL/min (A) (by C-G formula based on SCr of 1.53 mg/dL (H)). Liver Function Tests: No results for input(s): "AST", "ALT", "ALKPHOS", "BILITOT", "PROT", "ALBUMIN" in the last 168 hours. No results for input(s): "LIPASE", "AMYLASE" in the last 168 hours. No results for input(s): "AMMONIA" in the last 168 hours. Coagulation Profile: No results for input(s): "INR", "PROTIME" in the last 168 hours. Cardiac  Enzymes: No results for input(s): "CKTOTAL", "CKMB", "CKMBINDEX", "TROPONINI" in the last 168 hours. BNP (last 3 results) No results for input(s): "PROBNP" in the last 8760 hours. HbA1C: No results for input(s): "HGBA1C" in the last 72 hours. CBG: Recent Labs  Lab 03/20/23 0747 03/20/23 1114 03/20/23 1658 03/20/23 2017 03/21/23 0741  GLUCAP 133* 149* 122* 117* 114*   Lipid Profile: No results for input(s): "CHOL", "HDL", "LDLCALC", "TRIG", "CHOLHDL", "LDLDIRECT" in the last 72 hours. Thyroid Function Tests: No results for input(s): "TSH", "T4TOTAL", "FREET4", "T3FREE", "THYROIDAB" in the last 72 hours. Anemia Panel: No results for input(s): "VITAMINB12", "FOLATE", "FERRITIN", "TIBC", "IRON", "RETICCTPCT" in the last 72 hours. Sepsis Labs: No results for input(s): "PROCALCITON", "LATICACIDVEN" in the last 168 hours.  Recent Results (from the past 240 hour(s))  Urine Culture (for pregnant, neutropenic or urologic patients or patients with an indwelling urinary catheter)     Status: Abnormal   Collection Time: 03/12/23  8:11 AM   Specimen: Urine, Random  Result Value Ref Range Status   Specimen Description   Final    URINE, RANDOM Performed at [facility], [address], [place], [place] [address]    Special Requests   Final    NONE Performed at [facility], [address] [place], [place] [address]    Culture (A)  Final    <10,000 COLONIES/mL INSIGNIFICANT GROWTH Performed at [facility], [address], [place], [place] [address]    Report Status 03/13/2023 FINAL  Final         Radiology Studies: No results found.      Scheduled Meds:  amLODipine  10 mg Oral Daily   aspirin EC  81 mg Oral Daily   Chlorhexidine Gluconate Cloth  6 each Topical Daily   cloNIDine  0.2 mg Transdermal Weekly   fluticasone  1 spray Each Nare Daily   heparin  5,000 Units Subcutaneous Q8H   hydrALAZINE  50 mg Oral Q8H   insulin aspart  0-5 Units  Subcutaneous QHS   insulin aspart  0-9 Units Subcutaneous TID WC   loratadine  10 mg Oral Daily   losartan  50 mg Oral Daily   pantoprazole  40 mg Oral Daily   pravastatin  40 mg Oral q1800   venlafaxine XR  75 mg Oral QHS   Continuous Infusions:  sodium chloride Stopped (03/09/23 1239)     LOS: 13 days    Time spent: 25 mins     Charise Killian, MD [facility] Pager [phone]  If 7PM-7AM, please contact night-coverage [email or web address] 03/21/2023, 8:03 AM

## 2023-03-21 NOTE — Progress Notes (Signed)
[facility]  ROUNDING NOTE   Subjective:   Patient seen laying in bed  No family at bedside Alert, smiling.  Reports lack of sleep overnight due to various alarms  Creatinine 1.84   Objective:  Vital signs in last 24 hours:  Temp:  [97.5 F (36.4 C)-97.9 F (36.6 C)] 97.7 F (36.5 C) (05/29 1154) Pulse Rate:  [47-62] 55 (05/29 1154) Resp:  [16-18] 18 (05/29 1154) BP: (148-186)/(42-70) 175/47 (05/29 1154) SpO2:  [97 %-99 %] 99 % (05/29 1154)  Weight change:  Filed Weights   03/08/23 0626 03/08/23 0841  Weight: 87.5 kg 88.1 kg    Intake/Output: I/O last 3 completed shifts: In: 358 [P.O.:358] Out: -    Intake/Output this shift:  Total I/O In: 240 [P.O.:240] Out: -   Physical Exam: General: NAD  Head: Normocephalic, atraumatic. Moist oral mucosal membranes  Eyes: Anicteric  Lungs:  Clear on auscultation, room air  Heart: Regular rate and rhythm  Abdomen:  Soft, nontender  Extremities:  No peripheral edema.  Neurologic: Alert and oriented, moving all four extremities  Skin: No lesions  Access: None    Basic Metabolic Panel: Recent Labs  Lab 03/15/23 0654 03/16/23 0540 03/17/23 0538 03/18/23 0424 03/19/23 0509 03/20/23 0427 03/21/23 0345 03/21/23 0832  NA 133*   < > 136 139 140 142  --  140  K 4.1   < > 3.6 3.7 3.4* 4.4  --  4.2  CL 101   < > 104 107 108 111  --  108  CO2 21*   < > 22 24 24  21*  --  23  GLUCOSE 123*   < > 119* 116* 132* 139*  --  118*  BUN 83*   < > 91* 79* 62* 54*  --  54*  CREATININE 2.92*   < > 2.25* 1.65* 1.53* 1.53*  --  1.84*  CALCIUM 7.9*   < > 8.4* 8.5* 8.4* 8.7*  --  8.5*  MG 2.2  --   --   --   --  2.5* 2.4  --    < > = values in this interval not displayed.     Liver Function Tests: No results for input(s): "AST", "ALT", "ALKPHOS", "BILITOT", "PROT", "ALBUMIN" in the last 168 hours.  No results for input(s): "LIPASE", "AMYLASE" in the last 168 hours. No results for input(s): "AMMONIA" in the last 168  hours.  CBC: Recent Labs  Lab 03/20/23 0427 03/21/23 0345  WBC 13.4* 13.0*  HGB 10.4* 9.5*  HCT 32.4* 30.1*  MCV 95.0 94.7  PLT 318 289     Cardiac Enzymes: No results for input(s): "CKTOTAL", "CKMB", "CKMBINDEX", "TROPONINI" in the last 168 hours.  BNP: Invalid input(s): "POCBNP"  CBG: Recent Labs  Lab 03/20/23 1114 03/20/23 1658 03/20/23 2017 03/21/23 0741 03/21/23 1154  GLUCAP 149* 122* 117* 114* 115*     Microbiology: Results for orders placed or performed during the hospital encounter of 03/08/23  Urine Culture (for pregnant, neutropenic or urologic patients or patients with an indwelling urinary catheter)     Status: Abnormal   Collection Time: 03/12/23  8:11 AM   Specimen: Urine, Random  Result Value Ref Range Status   Specimen Description   Final    URINE, RANDOM Performed at [facility], [address] [place], [place] [address]    Special Requests   Final    NONE Performed at [facility], [address] [place], [place] [address]  Culture (A)  Final    <10,000 COLONIES/mL INSIGNIFICANT GROWTH Performed at [facility], [address] [place], [place] [address]    Report Status 03/13/2023 FINAL  Final    Coagulation Studies: No results for input(s): "LABPROT", "INR" in the last 72 hours.  Urinalysis: No results for input(s): "COLORURINE", "LABSPEC", "PHURINE", "GLUCOSEU", "HGBUR", "BILIRUBINUR", "KETONESUR", "PROTEINUR", "UROBILINOGEN", "NITRITE", "LEUKOCYTESUR" in the last 72 hours.  Invalid input(s): "APPERANCEUR"     Imaging: No results found.   Medications:    sodium chloride Stopped (03/09/23 1239)    amLODipine  10 mg Oral Daily   aspirin EC  81 mg Oral Daily   Chlorhexidine Gluconate Cloth  6 each Topical Daily   cloNIDine  0.2 mg Transdermal Weekly   fluticasone  1 spray Each Nare Daily   heparin  5,000 Units Subcutaneous Q8H   hydrALAZINE  50 mg Oral Q8H   insulin aspart  0-5 Units  Subcutaneous QHS   insulin aspart  0-9 Units Subcutaneous TID WC   loratadine  10 mg Oral Daily   losartan  50 mg Oral Daily   pantoprazole  40 mg Oral Daily   pravastatin  40 mg Oral q1800   venlafaxine XR  75 mg Oral QHS   acetaminophen **OR** acetaminophen, diphenoxylate-atropine, guaiFENesin-dextromethorphan, hydrALAZINE, melatonin, ondansetron, phenol, white petrolatum  Assessment/ Plan:  Karina Leonard is a 82 y.o.  female  with hypertension, peripheral vascular disease, carotid artery stenosis, coronary artery disease, atrial fibrillation, diastolic congestive heart failure and TIA, who was admitted to [facility] on 03/08/2023 for Malignant hypertension [I10]   Acute Kidney Injury on chronic kidney disease stage IIIA: baseline creatinine of 1.17, GFR of 47 on 02/27/23. Acute kidney injury secondary to hypertensive emergency. Urinalysis with proteinuria and hematuria from 2023. No IV contrast exposure. No signs of obstruction.  -Creatinine slightly elevated, not unexpected with initiation of Losartan.  - Will schedule follow up in our office at discharge. Cleared from renal stance for discharge.    Lab Results  Component Value Date   CREATININE 1.84 (H) 03/21/2023   CREATININE 1.53 (H) 03/20/2023   CREATININE 1.53 (H) 03/19/2023    Intake/Output Summary (Last 24 hours) at 03/21/2023 1333 Last data filed at 03/21/2023 1050 Gross per 24 hour  Intake 360 ml  Output --  Net 360 ml     Hypertension: with hypertensive emergency on admission. Placed on clevedipine gtt while in ICU.Marland Kitchen Secondary work up shows no renal artery stenosis and normal thyroid function panel.   Home regimen of clonidine, furosemide, losartan, hydrochlorothiazide and metoprolol which have all been reordered.  Amlodipine added this admission -Continue holding hydrochlorothiazide, furosemide and losartan  - Plasma renin activity greater than 19000, aldosterone- negative,  and metanephrines pending. Catecholamines  negative, cortisol 7.4 -Blood pressure remains elevated, 204/58.  - Blood pressure remains liable. Goal sBP 150s to 160s. Diastolic decreased, will not perform any changes today.     LOS: 13   5/29/20241:33 PM

## 2023-03-21 NOTE — TOC Progression Note (Signed)
Transition of Care [facility]) - Progression Note    Patient Details  Name: ANBER KARPENKO MRN: [id number] Date of Birth: [birth year]  Transition of Care [facility]) CM/SW Contact  Allena Katz, LCSW Phone Number: 03/21/2023, 10:20 AM  Clinical Narrative:     CSW received call from daughter stating that she would like to go to [facility] and that she has confirmed with Elmarie Shiley that a bed is available for her. Message relayed to covering CM.   Expected Discharge Plan: Home w Home Health Services Barriers to Discharge: Continued Medical Work up  Expected Discharge Plan and Services       Living arrangements for the past 2 months: Single Family Home                                       Social Determinants of Health (SDOH) Interventions SDOH Screenings   Food Insecurity: No Food Insecurity (03/08/2023)  Housing: Low Risk  (03/08/2023)  Transportation Needs: No Transportation Needs (03/08/2023)  Utilities: Not At Risk (03/08/2023)  Alcohol Screen: Low Risk  (03/22/2022)  Depression (PHQ2-9): Low Risk  (02/02/2023)  Financial Resource Strain: Low Risk  (03/22/2022)  Physical Activity: Insufficiently Active (03/22/2022)  Social Connections: Moderately Isolated (03/22/2022)  Stress: No Stress Concern Present (03/22/2022)  Tobacco Use: Medium Risk (03/16/2023)    Readmission Risk Interventions     No data to display

## 2023-03-21 NOTE — TOC Progression Note (Signed)
Transition of Care [facility]) - Progression Note    Patient Details  Name: Karina Leonard MRN: [id number] Date of Birth: 1941/02/02  Transition of Care [facility]) CM/SW Contact  Garret Reddish, RN Phone Number: 03/21/2023, 3:55 PM  Clinical Narrative:   SNF authorization submitted.  SNF authorization pending at this time.  Pending authorization number is [id number].    Patient will have a bed at [facility] on tomorrow pending SNF authorization.    TOC will continue to follow progress of patient.      Expected Discharge Plan: Home w Home Health Services Barriers to Discharge: Continued Medical Work up  Expected Discharge Plan and Services       Living arrangements for the past 2 months: Single Family Home                                       Social Determinants of Health (SDOH) Interventions SDOH Screenings   Food Insecurity: No Food Insecurity (03/08/2023)  Housing: Low Risk  (03/08/2023)  Transportation Needs: No Transportation Needs (03/08/2023)  Utilities: Not At Risk (03/08/2023)  Alcohol Screen: Low Risk  (03/22/2022)  Depression (PHQ2-9): Low Risk  (02/02/2023)  Financial Resource Strain: Low Risk  (03/22/2022)  Physical Activity: Insufficiently Active (03/22/2022)  Social Connections: Moderately Isolated (03/22/2022)  Stress: No Stress Concern Present (03/22/2022)  Tobacco Use: Medium Risk (03/16/2023)    Readmission Risk Interventions     No data to display

## 2023-03-22 DIAGNOSIS — I2510 Atherosclerotic heart disease of native coronary artery without angina pectoris: Secondary | ICD-10-CM | POA: Diagnosis not present

## 2023-03-22 DIAGNOSIS — I69392 Facial weakness following cerebral infarction: Secondary | ICD-10-CM | POA: Diagnosis not present

## 2023-03-22 DIAGNOSIS — I69391 Dysphagia following cerebral infarction: Secondary | ICD-10-CM | POA: Diagnosis not present

## 2023-03-22 DIAGNOSIS — R1312 Dysphagia, oropharyngeal phase: Secondary | ICD-10-CM | POA: Diagnosis not present

## 2023-03-22 DIAGNOSIS — I6523 Occlusion and stenosis of bilateral carotid arteries: Secondary | ICD-10-CM | POA: Diagnosis not present

## 2023-03-22 DIAGNOSIS — I69320 Aphasia following cerebral infarction: Secondary | ICD-10-CM | POA: Diagnosis not present

## 2023-03-22 DIAGNOSIS — M6281 Muscle weakness (generalized): Secondary | ICD-10-CM | POA: Diagnosis not present

## 2023-03-22 DIAGNOSIS — I480 Paroxysmal atrial fibrillation: Secondary | ICD-10-CM | POA: Diagnosis not present

## 2023-03-22 DIAGNOSIS — I161 Hypertensive emergency: Secondary | ICD-10-CM | POA: Diagnosis not present

## 2023-03-22 DIAGNOSIS — I639 Cerebral infarction, unspecified: Secondary | ICD-10-CM | POA: Diagnosis not present

## 2023-03-22 DIAGNOSIS — Z7982 Long term (current) use of aspirin: Secondary | ICD-10-CM | POA: Diagnosis not present

## 2023-03-22 DIAGNOSIS — I69314 Frontal lobe and executive function deficit following cerebral infarction: Secondary | ICD-10-CM | POA: Diagnosis not present

## 2023-03-22 DIAGNOSIS — I129 Hypertensive chronic kidney disease with stage 1 through stage 4 chronic kidney disease, or unspecified chronic kidney disease: Secondary | ICD-10-CM | POA: Diagnosis not present

## 2023-03-22 DIAGNOSIS — E1122 Type 2 diabetes mellitus with diabetic chronic kidney disease: Secondary | ICD-10-CM | POA: Diagnosis not present

## 2023-03-22 DIAGNOSIS — E7849 Other hyperlipidemia: Secondary | ICD-10-CM | POA: Diagnosis not present

## 2023-03-22 DIAGNOSIS — J309 Allergic rhinitis, unspecified: Secondary | ICD-10-CM | POA: Diagnosis not present

## 2023-03-22 DIAGNOSIS — Z7901 Long term (current) use of anticoagulants: Secondary | ICD-10-CM | POA: Diagnosis not present

## 2023-03-22 DIAGNOSIS — I4891 Unspecified atrial fibrillation: Secondary | ICD-10-CM | POA: Diagnosis not present

## 2023-03-22 DIAGNOSIS — I69398 Other sequelae of cerebral infarction: Secondary | ICD-10-CM | POA: Diagnosis not present

## 2023-03-22 DIAGNOSIS — K219 Gastro-esophageal reflux disease without esophagitis: Secondary | ICD-10-CM | POA: Diagnosis not present

## 2023-03-22 DIAGNOSIS — N1831 Chronic kidney disease, stage 3a: Secondary | ICD-10-CM | POA: Diagnosis not present

## 2023-03-22 DIAGNOSIS — I10 Essential (primary) hypertension: Secondary | ICD-10-CM | POA: Diagnosis not present

## 2023-03-22 DIAGNOSIS — N183 Chronic kidney disease, stage 3 unspecified: Secondary | ICD-10-CM | POA: Diagnosis not present

## 2023-03-22 DIAGNOSIS — N179 Acute kidney failure, unspecified: Secondary | ICD-10-CM | POA: Diagnosis not present

## 2023-03-22 LAB — CBC
HCT: 30.1 % — ABNORMAL LOW (ref 36.0–46.0)
Hemoglobin: 9.5 g/dL — ABNORMAL LOW (ref 12.0–15.0)
MCH: 29.9 pg (ref 26.0–34.0)
MCHC: 31.6 g/dL (ref 30.0–36.0)
MCV: 94.7 fL (ref 80.0–100.0)
Platelets: 295 10*3/uL (ref 150–400)
RBC: 3.18 MIL/uL — ABNORMAL LOW (ref 3.87–5.11)
RDW: 14.5 % (ref 11.5–15.5)
WBC: 13.8 10*3/uL — ABNORMAL HIGH (ref 4.0–10.5)
nRBC: 0 % (ref 0.0–0.2)

## 2023-03-22 LAB — BASIC METABOLIC PANEL
Anion gap: 9 (ref 5–15)
BUN: 60 mg/dL — ABNORMAL HIGH (ref 8–23)
CO2: 21 mmol/L — ABNORMAL LOW (ref 22–32)
Calcium: 8.2 mg/dL — ABNORMAL LOW (ref 8.9–10.3)
Chloride: 107 mmol/L (ref 98–111)
Creatinine, Ser: 2.19 mg/dL — ABNORMAL HIGH (ref 0.44–1.00)
GFR, Estimated: 22 mL/min — ABNORMAL LOW (ref >60)
Glucose, Bld: 106 mg/dL — ABNORMAL HIGH (ref 70–99)
Potassium: 4 mmol/L (ref 3.5–5.1)
Sodium: 137 mmol/L (ref 135–145)

## 2023-03-22 LAB — GLUCOSE, CAPILLARY
Glucose-Capillary: 105 mg/dL — ABNORMAL HIGH (ref 70–99)
Glucose-Capillary: 170 mg/dL — ABNORMAL HIGH (ref 70–99)

## 2023-03-22 LAB — MAGNESIUM: Magnesium: 2.3 mg/dL (ref 1.7–2.4)

## 2023-03-22 MED ORDER — FUROSEMIDE 20 MG PO TABS: 20.0000 mg | ORAL_TABLET | Freq: Every day | ORAL | Status: DC

## 2023-03-22 MED ORDER — LOSARTAN POTASSIUM 50 MG PO TABS: 50.0000 mg | ORAL_TABLET | Freq: Every day | ORAL | 0 refills | Status: DC

## 2023-03-22 MED ORDER — AMLODIPINE BESYLATE 10 MG PO TABS: 10.0000 mg | ORAL_TABLET | Freq: Every day | ORAL | 0 refills | Status: AC

## 2023-03-22 MED ORDER — PANTOPRAZOLE SODIUM 40 MG PO TBEC: 40.0000 mg | DELAYED_RELEASE_TABLET | Freq: Every day | ORAL | 0 refills | Status: DC

## 2023-03-22 MED ORDER — APIXABAN 2.5 MG PO TABS: 2.5000 mg | ORAL_TABLET | Freq: Two times a day (BID) | ORAL | 0 refills | Status: AC

## 2023-03-22 MED ORDER — HYDRALAZINE HCL 50 MG PO TABS: 50.0000 mg | ORAL_TABLET | Freq: Three times a day (TID) | ORAL | 0 refills | Status: AC

## 2023-03-22 NOTE — Care Management Important Message (Signed)
Important Message  Patient Details  Name: Karina Leonard MRN: [id number] Date of Birth: 28-Nov-1940   Medicare Important Message Given:  Yes     Verita Schneiders Swayzee Wadley 03/22/2023, 12:35 PM

## 2023-03-22 NOTE — Progress Notes (Signed)
Mobility Specialist - Progress Note   03/22/23 0949  Mobility  Activity Ambulated with assistance in hallway;Dangled on edge of bed  Level of Assistance Standby assist, set-up cues, supervision of patient - no hands on  Assistive Device Front wheel walker  Distance Ambulated (ft) 240 ft  Activity Response Tolerated well  Mobility Referral Yes  $Mobility charge 1 Mobility  Mobility Specialist Start Time (ACUTE ONLY) [id number]  Mobility Specialist Stop Time (ACUTE ONLY) 0942  Mobility Specialist Time Calculation (min) (ACUTE ONLY) 21 min   Pt sitting in recliner on RA upon arrival. Pt STS and ambulates in hallway SBA with no LOB noted. Pt takes 2 standing rest breaks during ambulation. Pt returns to recliner with needs in reach and chair alarm activated.   Terrilyn Saver  Mobility Specialist  03/22/23 9:51 AM

## 2023-03-22 NOTE — TOC Transition Note (Addendum)
Transition of Care [facility]) - CM/SW Discharge Note   Patient Details  Name: Karina Leonard MRN: [id number] Date of Birth: [birth year]  Transition of Care [facility]) CM/SW Contact:  Allena Katz, LCSW Phone Number: 03/22/2023, 1:14 PM   Clinical Narrative:   Pt has orders to discharge to [facility]. RN given number for report. Dc summary sent. Daughter to transport.   BCBS  auth approved for 03/22/2023-03/26/2023.  Final next level of care: Skilled Nursing Facility Barriers to Discharge: Barriers Resolved   Patient Goals and CMS Choice CMS Medicare.gov Compare Post Acute Care list provided to:: Patient Choice offered to / list presented to : Patient  Discharge Placement PASRR number recieved: 03/14/23 PASRR number recieved: 03/14/23              Patient to be transferred to facility by: daughter Name of family member notified: daughter Patient and family notified of of transfer: 03/22/23  Discharge Plan and Services Additional resources added to the After Visit Summary for                                       Social Determinants of Health (SDOH) Interventions SDOH Screenings   Food Insecurity: No Food Insecurity (03/08/2023)  Housing: Low Risk  (03/08/2023)  Transportation Needs: No Transportation Needs (03/08/2023)  Utilities: Not At Risk (03/08/2023)  Alcohol Screen: Low Risk  (03/22/2022)  Depression (PHQ2-9): Low Risk  (02/02/2023)  Financial Resource Strain: Low Risk  (03/22/2022)  Physical Activity: Insufficiently Active (03/22/2022)  Social Connections: Moderately Isolated (03/22/2022)  Stress: No Stress Concern Present (03/22/2022)  Tobacco Use: Medium Risk (03/16/2023)     Readmission Risk Interventions     No data to display

## 2023-03-22 NOTE — Progress Notes (Signed)
[facility]  ROUNDING NOTE   Subjective:   Sitting up in bed No family at bedside States she feel well  Denies headache Remains weak but eager to to rehab  Creatinine 2.19   Objective:  Vital signs in last 24 hours:  Temp:  [97.6 F (36.4 C)-98.2 F (36.8 C)] 97.6 F (36.4 C) (05/30 0736) Pulse Rate:  [46-107] 57 (05/30 1132) Resp:  [16-18] 18 (05/30 1132) BP: (133-172)/(40-75) 171/72 (05/30 1132) SpO2:  [96 %-98 %] 98 % (05/30 1132)  Weight change:  Filed Weights   03/08/23 0626 03/08/23 0841  Weight: 87.5 kg 88.1 kg    Intake/Output: I/O last 3 completed shifts: In: 600 [P.O.:600] Out: -    Intake/Output this shift:  No intake/output data recorded.  Physical Exam: General: NAD  Head: Normocephalic, atraumatic. Moist oral mucosal membranes  Eyes: Anicteric  Lungs:  Clear on auscultation, room air  Heart: Regular rate and rhythm  Abdomen:  Soft, nontender  Extremities:  No peripheral edema.  Neurologic: Alert and oriented, moving all four extremities  Skin: No lesions  Access: None    Basic Metabolic Panel: Recent Labs  Lab 03/18/23 0424 03/19/23 0509 03/20/23 0427 03/21/23 0345 03/21/23 0832 03/22/23 0324  NA 139 140 142  --  140 137  K 3.7 3.4* 4.4  --  4.2 4.0  CL 107 108 111  --  108 107  CO2 24 24 21*  --  23 21*  GLUCOSE 116* 132* 139*  --  118* 106*  BUN 79* 62* 54*  --  54* 60*  CREATININE 1.65* 1.53* 1.53*  --  1.84* 2.19*  CALCIUM 8.5* 8.4* 8.7*  --  8.5* 8.2*  MG  --   --  2.5* 2.4  --  2.3     Liver Function Tests: No results for input(s): "AST", "ALT", "ALKPHOS", "BILITOT", "PROT", "ALBUMIN" in the last 168 hours.  No results for input(s): "LIPASE", "AMYLASE" in the last 168 hours. No results for input(s): "AMMONIA" in the last 168 hours.  CBC: Recent Labs  Lab 03/20/23 0427 03/21/23 0345 03/22/23 0324  WBC 13.4* 13.0* 13.8*  HGB 10.4* 9.5* 9.5*  HCT 32.4* 30.1* 30.1*  MCV 95.0 94.7 94.7  PLT 318 289  295     Cardiac Enzymes: No results for input(s): "CKTOTAL", "CKMB", "CKMBINDEX", "TROPONINI" in the last 168 hours.  BNP: Invalid input(s): "POCBNP"  CBG: Recent Labs  Lab 03/21/23 1154 03/21/23 1653 03/21/23 2035 03/22/23 0735 03/22/23 1133  GLUCAP 115* 87 98 105* 170*     Microbiology: Results for orders placed or performed during the hospital encounter of 03/08/23  Urine Culture (for pregnant, neutropenic or urologic patients or patients with an indwelling urinary catheter)     Status: Abnormal   Collection Time: 03/12/23  8:11 AM   Specimen: Urine, Random  Result Value Ref Range Status   Specimen Description   Final    URINE, RANDOM Performed at [facility], [address] [place], [place] [address]    Special Requests   Final    NONE Performed at [facility], [address] [place], [place] [address]    Culture (A)  Final    <10,000 COLONIES/mL INSIGNIFICANT GROWTH Performed at [facility], [address], [place], [place] [address]    Report Status 03/13/2023 FINAL  Final    Coagulation Studies: No results for input(s): "LABPROT", "INR" in the last 72 hours.  Urinalysis: No results for input(s): "COLORURINE", "LABSPEC", "  PHURINE", "GLUCOSEU", "HGBUR", "BILIRUBINUR", "KETONESUR", "PROTEINUR", "UROBILINOGEN", "NITRITE", "LEUKOCYTESUR" in the last 72 hours.  Invalid input(s): "APPERANCEUR"     Imaging: No results found.   Medications:    sodium chloride Stopped (03/09/23 1239)    amLODipine  10 mg Oral Daily   apixaban  2.5 mg Oral BID   Chlorhexidine Gluconate Cloth  6 each Topical Daily   cloNIDine  0.2 mg Transdermal Weekly   fluticasone  1 spray Each Nare Daily   hydrALAZINE  50 mg Oral Q8H   insulin aspart  0-5 Units Subcutaneous QHS   insulin aspart  0-9 Units Subcutaneous TID WC   loratadine  10 mg Oral Daily   losartan  50 mg Oral Daily   pantoprazole  40 mg Oral Daily   pravastatin  40 mg Oral q1800    venlafaxine XR  75 mg Oral QHS   acetaminophen **OR** acetaminophen, diphenoxylate-atropine, guaiFENesin-dextromethorphan, hydrALAZINE, melatonin, ondansetron, phenol, white petrolatum  Assessment/ Plan:  [name] is a 82 y.o.  female  with hypertension, peripheral vascular disease, carotid artery stenosis, coronary artery disease, atrial fibrillation, diastolic congestive heart failure and TIA, who was admitted to [facility] on 03/08/2023 for Malignant hypertension [I10]   Acute Kidney Injury on chronic kidney disease stage IIIA: baseline creatinine of 1.17, GFR of 47 on 02/27/23. Acute kidney injury secondary to hypertensive emergency. Urinalysis with proteinuria and hematuria from 2023. No IV contrast exposure. No signs of obstruction.  -Creatinine has increased - Patient cleared to discharge from renal stance - Will schedule follow up in our office at discharge. Cleared from renal stance for discharge.    Lab Results  Component Value Date   CREATININE 2.19 (H) 03/22/2023   CREATININE 1.84 (H) 03/21/2023   CREATININE 1.53 (H) 03/20/2023    Intake/Output Summary (Last 24 hours) at 03/22/2023 1351 Last data filed at 03/21/2023 2100 Gross per 24 hour  Intake 240 ml  Output --  Net 240 ml     Hypertension: with hypertensive emergency on admission. Placed on clevedipine gtt while in ICU.Marland Kitchen Secondary work up shows no renal artery stenosis and normal thyroid function panel.   Home regimen of clonidine, furosemide, losartan, hydrochlorothiazide and metoprolol which have all been reordered.  Amlodipine added this admission -Continue holding hydrochlorothiazide, furosemide and losartan  - Plasma renin activity greater than 19000, aldosterone- negative,  and metanephrines pending. Catecholamines negative, cortisol 7.4 -Blood pressure control improved with prescribed medications     LOS: 14   5/30/20241:51 PM

## 2023-03-22 NOTE — Discharge Summary (Addendum)
Physician Discharge Summary  Karina Leonard WGN:[id number] DOB: [birth year] DOA: 03/08/2023  PCP: Duanne Limerick, MD  Admit date: 03/08/2023 Discharge date: 03/22/2023  Admitted From: home  Disposition:  SNF  Recommendations for Outpatient Follow-up:  Follow up with PCP in 1-2 weeks F/u w/ cardio, Dr. Juliann Pares, in 1-2 weeks F/u w/ nephro, Dr. Thedore Mins or Dr. Cherylann Ratel, in 1-2 weeks F/u w/ vasc surg, Dr. Wyn Quaker, in 1-2 weeks   Home Health: no  Equipment/Devices:  Discharge Condition: stable  CODE STATUS: full  Diet recommendation: Heart Healthy   Brief/Interim Summary: HPI was taken from NP Delton See: This is an 82 yo female with carotid artery stenosis who presented for an elective left carotid endarterectomy per vascular surgery on 05/16.  However, procedure canceled due to severe hypertension sbp 200's.  Pt subsequently admitted to the stepdown unit per vascular surgery and nitroglycerin gtt initiated for bp control.  Pt also restarted on her outpatient antihypertensives.  However, she remained hypertensive and nicardipine gtt added.  Pt later developed symptomatic hypotension sbp 90's to low 100's.  According to pts daughters pts baseline sbp 140-160's.  PCCM team consulted and levophed gtt initiated.     As NP Nelson: 05/16: Pt initially admitted to the stepdown unit with hypertensive urgency and later developed symptomatic hypotension secondary to antihypertensive medication requiring levophed gtt PCCM team consulted to assist with management  05/18: Pt had another episode of accelerated hypertension we have reviewed            medications, cleviprex gtt started overnight. Patient seems to have developed hydralazine        tolerance with rebound effect. Have increased HCTZ to 50 from 25.  Have met with        cardiology - Dr Juliann Pares , amlodipine 5 bid has been initiated. Will consider additional        therapy as needed while weaning down cleviprex gtt.  Her Chronic renal impairment         seems to have acute component with oliguric aki, will get nephrology to evaluate.  05/19: Pt in no distress this am. BP still elevated and still requires cleviprex titration with the PO regimen. Nephrology restarted clonidine due to concern of rebound hypertension following discontinuation of outpatient clonidine patch  05/20: Cleviprex gtt currently off.  SBP improved significantly now 130-150.  Pt now with increasing troponin [id number] Cardiology following and aware 05/20: MRA Head/Neck: multiple scattered bilateral small acute infarcts. The largest        lesions are located in the right cerebellum, left temporal lobe and left basal ganglia. No        hemorrhage or mass effect. Loss of flow related enhancement within the left vertebral        artery V4 segment, consistent with slow flow or occlusion. Signal loss of the distal right        common carotid artery and left internal carotid artery near the carotid bifurcation. This        may be exaggerated by motion artifact, but there is likely a component of stenosis or        occlusion. CTA of the neck may be helpful for further characterization. Multiple chronic        microhemorrhages in a predominantly central distribution, likely due to chronic        hypertensive microangiopathy. 05/21: Code stroke initiated on 05/20 due to new onset right-sided facial droop and RUE        weakness.  CT Head revealed new area of hypoattenuation in the left inferior temporal        gyrus, concerning for acute infarct. No acute intracranial hemorrhage. ASPECT score is 10   As per Dr. Mayford Knife 5/29-5/30/24: Pt was found to have a.fib on even monitor. Pt was started low dose eliquis as per cardio. Pt will continue w/ event monitor for another 30 days as per cardio. Pt will need to f/u w/ cardio & nephro outpatient. Pt and pt's family verbalized their understanding. PT/OT evaluated pt and recommended SNF.   Discharge Diagnoses:  Principal Problem:   Malignant  hypertension Active Problems:   Coronary artery disease   Carotid stenosis   Diabetes mellitus without complication (HCC)   Acute CVA (cerebrovascular accident) (HCC)   AKI (acute kidney injury) (HCC)  Hypertensive emergency: emergency resolved but still w/ HTN.   HTN: continue on amlodipine, clonidine, losartan, hydralazine & metoprolol. Hold lasix secondary to AKI on CKD.    Debility: PT/OT recs SNF    Acute CVA: continue on aspirin, statin. PT/OT recs SNF.    Likely PAF: found on event monitor as per cardio. Continue w/ event monitor. Started on and will continue on low dose of eliquis.   AKI on CKDIIIa: baseline Cr 1.17. Cr is labile. Nephro following and recs apprec   Likely ACD: likely secondary to CKD. No need for a transfusion currently    Carotid stenosis: continue on aspirin. D/c plavix as pt is now on eliquis and aspirin. F/u w/ vasc surg outpatient in 1-2 weeks for endarterectomy    Demand ischemia: likely secondary to labile BPs. Unlikely NSTEMI. No further inpatient cardiac work-up as per cardio. Continue on aspirin, statin. Cardio signed off. Please see cardiac notes for more info  Constipation: resolved  Discharge Instructions  Discharge Instructions     Diet - low sodium heart healthy   Complete by: As directed    Discharge instructions   Complete by: As directed    F/u w/ PCP in 1-2 weeks. F/u w/ cardio, Dr. Juliann Pares, in 1-2 weeks. F/u w/ nephro, Dr. Cherylann Ratel or Dr. Thedore Mins, in 1-2 weeks   Increase activity slowly   Complete by: As directed       Allergies as of 03/22/2023       Reactions   Amoxicillin Hives   Donepezil Diarrhea   Nicardipine Hcl In Dextrose Other (See Comments)   Patient vasovagal response on low dose cardene   Penicillins Hives        Medication List     STOP taking these medications    clopidogrel 75 MG tablet Commonly known as: PLAVIX   losartan-hydrochlorothiazide 100-25 MG tablet Commonly known as: HYZAAR        TAKE these medications    amLODipine 10 MG tablet Commonly known as: NORVASC Take 1 tablet (10 mg total) by mouth daily. Start taking on: Mar 23, 2023   apixaban 2.5 MG Tabs tablet Commonly known as: ELIQUIS Take 1 tablet (2.5 mg total) by mouth 2 (two) times daily.   aspirin EC 81 MG tablet Take 81 mg by mouth daily. Take while off of the plavix for surgey   cloNIDine 0.2 mg/24hr patch Commonly known as: CATAPRES - Dosed in mg/24 hr Place 0.2 mg onto the skin once a week. Sunday   fexofenadine 180 MG tablet Commonly known as: ALLEGRA Take 1 tablet (180 mg total) by mouth daily. otc   fluticasone 50 MCG/ACT nasal spray Commonly known as: FLONASE Place 1 spray  into both nostrils daily. otc   furosemide 20 MG tablet Commonly known as: LASIX Take 1 tablet (20 mg total) by mouth daily. Hold this medication until you see cardiology and/or nephrology What changed: additional instructions   hydrALAZINE 50 MG tablet Commonly known as: APRESOLINE Take 1 tablet (50 mg total) by mouth every 8 (eight) hours.   losartan 50 MG tablet Commonly known as: COZAAR Take 1 tablet (50 mg total) by mouth daily. Start taking on: Mar 23, 2023   lovastatin 20 MG tablet Commonly known as: MEVACOR TAKE 2 TABLETS EVERY DAY What changed:  how much to take how to take this when to take this   metoprolol succinate 50 MG 24 hr tablet Commonly known as: TOPROL-XL Take 1 tablet (50 mg total) by mouth daily. [place]   pantoprazole 40 MG tablet Commonly known as: PROTONIX Take 1 tablet (40 mg total) by mouth daily. Start taking on: Mar 23, 2023   venlafaxine XR 75 MG 24 hr capsule Commonly known as: EFFEXOR-XR Take 1 capsule (75 mg total) by mouth daily. What changed: when to take this        Follow-up Information     Callwood, Bobbie Stack, MD. Go in 1 week(s).   Specialties: Cardiology, Internal Medicine Contact information: [address] [place]  [address] [phone]         Annice Needy, MD Follow up in 2 week(s).   Specialties: Vascular Surgery, Radiology, Interventional Cardiology Why: with renal artery duplex Contact information: [address] [place] [address] [phone]         Mosetta Pigeon, MD Follow up.   Specialty: Nephrology Why: F/u in 1-2 weeks Contact information: [address] [place] [address] [phone]                Allergies  Allergen Reactions   Amoxicillin Hives   Donepezil Diarrhea   Nicardipine Hcl In Dextrose Other (See Comments)    Patient vasovagal response on low dose cardene   Penicillins Hives    Consultations: ICU Vascular surg Nephro  Cardio  Neuro    Procedures/Studies: US Carotid Bilateral  Result Date: 03/13/2023 CLINICAL DATA:  Carotid stenosis EXAM: BILATERAL CAROTID DUPLEX ULTRASOUND TECHNIQUE: Wallace Cullens scale imaging, color Doppler and duplex ultrasound were performed of bilateral carotid and vertebral arteries in the neck. COMPARISON:  None Available. FINDINGS: Criteria: Quantification of carotid stenosis is based on velocity parameters that correlate the residual internal carotid diameter with NASCET-based stenosis levels, using the diameter of the distal internal carotid lumen as the denominator for stenosis measurement. The following velocity measurements were obtained: RIGHT ICA: 116/16 cm/sec CCA: 121/19 cm/sec SYSTOLIC ICA/CCA RATIO:  1.0 ECA:  50 cm/sec LEFT ICA: 95/23 cm/sec CCA: 218/27 cm/sec SYSTOLIC ICA/CCA RATIO:  0.4 ECA:  105 cm/sec RIGHT CAROTID ARTERY: Extensive calcified atherosclerotic plaque throughout the visualized carotid artery. By peak systolic velocity criteria, estimated stenosis is less than 50%. RIGHT VERTEBRAL ARTERY:  Patent with normal antegrade flow. LEFT CAROTID ARTERY: Calcified atherosclerotic plaque throughout the visualized carotid system. By peak systolic velocity criteria, the estimated  stenosis is less than 50%. LEFT VERTEBRAL ARTERY:  Patent with normal antegrade flow. IMPRESSION: 1. Mild (1-49%) stenosis proximal right internal carotid artery secondary to extensive echogenic/calcified atherosclerotic plaque. 2. Mild (1-49%) stenosis proximal left internal carotid artery secondary to extensive echogenic/calcified atherosclerotic plaque. 3. Vertebral arteries are patent with normal antegrade flow. Electronically Signed   By: Isac Caddy.D.  On: 03/13/2023 15:15   ECHOCARDIOGRAM COMPLETE  Result Date: 03/13/2023    ECHOCARDIOGRAM REPORT   Patient Name:   HENLI MURDY Date of Exam: 03/13/2023 Medical Rec #:  [id number]       Height:       67.0 in Accession #:    [id number]      Weight:       194.2 lb Date of Birth:  17-Sep-1941       BSA:          1.997 m Patient Age:    82 years        BP:           165/44 mmHg Patient Gender: F               HR:           46 bpm. Exam Location:  [facility] Procedure: 2D Echo, Cardiac Doppler and Color Doppler Indications:     Stroke  History:         Patient has no prior history of Echocardiogram examinations.                  CAD, Stroke; Risk Factors:Hypertension and Diabetes.  Sonographer:     Mikki Harbor Referring Phys:  [id number] Neldon Newport NELSON Diagnosing Phys: Yvonne Kendall MD IMPRESSIONS  1. Left ventricular ejection fraction, by estimation, is >75%. The left ventricle has hyperdynamic function. The left ventricle has no regional wall motion abnormalities. There is severe left ventricular hypertrophy. Left ventricular diastolic parameters are indeterminate.  2. Right ventricular systolic function is moderately reduced. There is relative sparing of the apex that can be seen in the setting of acute cor pulmonale (e.g. acute pulmonary embolism; McConnell's sign). The right ventricular size is mildly enlarged. There is normal pulmonary artery systolic pressure.  3. Left atrial size was moderately dilated.  4. Right atrial size was moderately dilated.   5. The mitral valve is normal in structure. Trivial mitral valve regurgitation.  6. Tricuspid valve regurgitation is moderate.  7. The aortic valve is tricuspid. Aortic valve regurgitation is not visualized. No aortic stenosis is present.  8. The inferior vena cava is normal in size with greater than 50% respiratory variability, suggesting right atrial pressure of 3 mmHg. FINDINGS  Left Ventricle: Left ventricular ejection fraction, by estimation, is >75%. The left ventricle has hyperdynamic function. The left ventricle has no regional wall motion abnormalities. The left ventricular internal cavity size was normal in size. There is severe left ventricular hypertrophy. Left ventricular diastolic parameters are indeterminate. Right Ventricle: The right ventricular size is mildly enlarged. No increase in right ventricular wall thickness. Right ventricular systolic function is moderately reduced. There is normal pulmonary artery systolic pressure. The tricuspid regurgitant velocity is 1.84 m/s, and with an assumed right atrial pressure of 3 mmHg, the estimated right ventricular systolic pressure is 16.5 mmHg. Left Atrium: Left atrial size was moderately dilated. Right Atrium: Right atrial size was moderately dilated. Pericardium: There is no evidence of pericardial effusion. Mitral Valve: The mitral valve is normal in structure. Mild mitral annular calcification. Trivial mitral valve regurgitation. MV peak gradient, 2.6 mmHg. The mean mitral valve gradient is 0.0 mmHg. Tricuspid Valve: The tricuspid valve is normal in structure. Tricuspid valve regurgitation is moderate. Aortic Valve: The aortic valve is tricuspid. Aortic valve regurgitation is not visualized. No aortic stenosis is present. Aortic valve mean gradient measures 2.0 mmHg. Aortic valve peak gradient measures 5.1 mmHg. Aortic valve area, by  VTI measures 2.18 cm. Pulmonic Valve: The pulmonic valve was normal in structure. Pulmonic valve regurgitation is  trivial. Aorta: The aortic root is normal in size and structure. Pulmonary Artery: The pulmonary artery is of normal size. Venous: The inferior vena cava is normal in size with greater than 50% respiratory variability, suggesting right atrial pressure of 3 mmHg. IAS/Shunts: No atrial level shunt detected by color flow Doppler.  LEFT VENTRICLE PLAX 2D LVIDd:         4.10 cm LVIDs:         2.00 cm LV PW:         1.90 cm LV IVS:        1.90 cm LVOT diam:     2.00 cm LV SV:         61 LV SV Index:   31 LVOT Area:     3.14 cm  RIGHT VENTRICLE RV Basal diam:  4.05 cm RV Mid diam:    3.00 cm RV S prime:     5.01 cm/s LEFT ATRIUM              Index        RIGHT ATRIUM           Index LA diam:        4.60 cm  2.30 cm/m   RA Area:     27.10 cm LA Vol (A2C):   107.0 ml 53.57 ml/m  RA Volume:   83.70 ml  41.91 ml/m LA Vol (A4C):   103.0 ml 51.57 ml/m LA Biplane Vol: 108.0 ml 54.08 ml/m  AORTIC VALVE                    PULMONIC VALVE AV Area (Vmax):    2.46 cm     PV Vmax:       0.47 m/s AV Area (Vmean):   2.16 cm     PV Peak grad:  0.9 mmHg AV Area (VTI):     2.18 cm AV Vmax:           113.00 cm/s AV Vmean:          68.900 cm/s AV VTI:            0.281 m AV Peak Grad:      5.1 mmHg AV Mean Grad:      2.0 mmHg LVOT Vmax:         88.60 cm/s LVOT Vmean:        47.300 cm/s LVOT VTI:          0.195 m LVOT/AV VTI ratio: 0.69  AORTA Ao Root diam: 3.00 cm MITRAL VALVE               TRICUSPID VALVE MV Area (PHT): 3.17 cm    TR Peak grad:   13.5 mmHg MV Area VTI:   2.63 cm    TR Vmax:        184.00 cm/s MV Peak grad:  2.6 mmHg MV Mean grad:  0.0 mmHg    SHUNTS MV Vmax:       0.81 m/s    Systemic VTI:  0.20 m MV Vmean:      26.9 cm/s   Systemic Diam: 2.00 cm MV Decel Time: 239 msec MV E velocity: 70.30 cm/s Yvonne Kendall MD Electronically signed by Yvonne Kendall MD Signature Date/Time: 03/13/2023/1:12:28 PM    Final    MR BRAIN WO CONTRAST  Result Date: 03/12/2023 CLINICAL DATA:  Right  facial droop EXAM: MRI HEAD  WITHOUT CONTRAST MRA HEAD WITHOUT CONTRAST MRA NECK WITHOUT CONTRAST TECHNIQUE: Multiplanar, multiecho pulse sequences of the brain and surrounding structures were obtained without intravenous contrast. Angiographic images of the Circle of Willis were obtained using MRA technique without intravenous contrast. Angiographic images of the neck were obtained using MRA technique without intravenous contrast. Carotid stenosis measurements (when applicable) are obtained utilizing NASCET criteria, using the distal internal carotid diameter as the denominator. COMPARISON:  01/27/2023 FINDINGS: MRI HEAD FINDINGS Brain: Multiple scattered bilateral small acute infarcts. The largest lesions are located in the right cerebellum, left temporal lobe and left basal ganglia. Aside from the right cerebellar lesion, all lesions are on the left. Multiple chronic microhemorrhages in a predominantly central distribution. There is confluent hyperintense T2-weighted signal within the white matter. Generalized volume loss. Ex vacuo dilatation of the right lateral ventricle. The midline structures are normal. Vascular: Major flow voids are preserved. Skull and upper cervical spine: Normal calvarium and skull base. Visualized upper cervical spine and soft tissues are normal. Sinuses/Orbits:No paranasal sinus fluid levels or advanced mucosal thickening. No mastoid or middle ear effusion. Normal orbits. MRA HEAD FINDINGS POSTERIOR CIRCULATION: --Vertebral arteries: Loss of flow related enhancement within the left V4 segment. Normal left PICA. --Inferior cerebellar arteries: Normal. --Basilar artery: Normal. --Superior cerebellar arteries: Normal. --Posterior cerebral arteries: Normal. The right PCA is predominantly supplied by the posterior communicating artery. ANTERIOR CIRCULATION: --Intracranial internal carotid arteries: Atherosclerotic irregularity of the internal carotid arteries at the skull base, left-greater-than-right. --Anterior  cerebral arteries (ACA): Normal. --Middle cerebral arteries (MCA): Normal. MRA NECK FINDINGS Motion degraded MRA of the neck. Aortic arch: Unremarkable Right carotid system: There is signal loss of the distal common carotid artery. The right ICA is patent. Left carotid system: There is atherosclerotic irregularity throughout the left carotid system with an area of signal loss near the carotid bifurcation. Vertebral arteries: Right dominant.  No stenosis. IMPRESSION: 1. Multiple scattered bilateral small acute infarcts. The largest lesions are located in the right cerebellum, left temporal lobe and left basal ganglia. No hemorrhage or mass effect. 2. Loss of flow related enhancement within the left vertebral artery V4 segment, consistent with slow flow or occlusion. 3. Signal loss of the distal right common carotid artery and left internal carotid artery near the carotid bifurcation. This may be exaggerated by motion artifact, but there is likely a component of stenosis or occlusion. CTA of the neck may be helpful for further characterization. 4. 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 Electronically Signed   By: Deatra Robinson M.D.   On: 03/12/2023 21:48   MR ANGIO NECK WO CONTRAST  Result Date: 03/12/2023 CLINICAL DATA:  Right facial droop EXAM: MRI HEAD WITHOUT CONTRAST MRA HEAD WITHOUT CONTRAST MRA NECK WITHOUT CONTRAST TECHNIQUE: Multiplanar, multiecho pulse sequences of the brain and surrounding structures were obtained without intravenous contrast. Angiographic images of the Circle of Willis were obtained using MRA technique without intravenous contrast. Angiographic images of the neck were obtained using MRA technique without intravenous contrast. Carotid stenosis measurements (when applicable) are obtained utilizing NASCET criteria, using the distal internal carotid diameter as the denominator. COMPARISON:  01/27/2023  FINDINGS: MRI HEAD FINDINGS Brain: Multiple scattered bilateral small acute infarcts. The largest lesions are located in the right cerebellum, left temporal lobe and left basal ganglia. Aside from the right cerebellar lesion, all lesions are on the left. Multiple chronic microhemorrhages in a predominantly central distribution. There is  confluent hyperintense T2-weighted signal within the white matter. Generalized volume loss. Ex vacuo dilatation of the right lateral ventricle. The midline structures are normal. Vascular: Major flow voids are preserved. Skull and upper cervical spine: Normal calvarium and skull base. Visualized upper cervical spine and soft tissues are normal. Sinuses/Orbits:No paranasal sinus fluid levels or advanced mucosal thickening. No mastoid or middle ear effusion. Normal orbits. MRA HEAD FINDINGS POSTERIOR CIRCULATION: --Vertebral arteries: Loss of flow related enhancement within the left V4 segment. Normal left PICA. --Inferior cerebellar arteries: Normal. --Basilar artery: Normal. --Superior cerebellar arteries: Normal. --Posterior cerebral arteries: Normal. The right PCA is predominantly supplied by the posterior communicating artery. ANTERIOR CIRCULATION: --Intracranial internal carotid arteries: Atherosclerotic irregularity of the internal carotid arteries at the skull base, left-greater-than-right. --Anterior cerebral arteries (ACA): Normal. --Middle cerebral arteries (MCA): Normal. MRA NECK FINDINGS Motion degraded MRA of the neck. Aortic arch: Unremarkable Right carotid system: There is signal loss of the distal common carotid artery. The right ICA is patent. Left carotid system: There is atherosclerotic irregularity throughout the left carotid system with an area of signal loss near the carotid bifurcation. Vertebral arteries: Right dominant.  No stenosis. IMPRESSION: 1. Multiple scattered bilateral small acute infarcts. The largest lesions are located in the right cerebellum, left  temporal lobe and left basal ganglia. No hemorrhage or mass effect. 2. Loss of flow related enhancement within the left vertebral artery V4 segment, consistent with slow flow or occlusion. 3. Signal loss of the distal right common carotid artery and left internal carotid artery near the carotid bifurcation. This may be exaggerated by motion artifact, but there is likely a component of stenosis or occlusion. CTA of the neck may be helpful for further characterization. 4. 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 Electronically Signed   By: Deatra Robinson M.D.   On: 03/12/2023 21:48   MR ANGIO HEAD WO CONTRAST  Result Date: 03/12/2023 CLINICAL DATA:  Right facial droop EXAM: MRI HEAD WITHOUT CONTRAST MRA HEAD WITHOUT CONTRAST MRA NECK WITHOUT CONTRAST TECHNIQUE: Multiplanar, multiecho pulse sequences of the brain and surrounding structures were obtained without intravenous contrast. Angiographic images of the Circle of Willis were obtained using MRA technique without intravenous contrast. Angiographic images of the neck were obtained using MRA technique without intravenous contrast. Carotid stenosis measurements (when applicable) are obtained utilizing NASCET criteria, using the distal internal carotid diameter as the denominator. COMPARISON:  01/27/2023 FINDINGS: MRI HEAD FINDINGS Brain: Multiple scattered bilateral small acute infarcts. The largest lesions are located in the right cerebellum, left temporal lobe and left basal ganglia. Aside from the right cerebellar lesion, all lesions are on the left. Multiple chronic microhemorrhages in a predominantly central distribution. There is confluent hyperintense T2-weighted signal within the white matter. Generalized volume loss. Ex vacuo dilatation of the right lateral ventricle. The midline structures are normal. Vascular: Major flow voids are preserved. Skull and upper cervical  spine: Normal calvarium and skull base. Visualized upper cervical spine and soft tissues are normal. Sinuses/Orbits:No paranasal sinus fluid levels or advanced mucosal thickening. No mastoid or middle ear effusion. Normal orbits. MRA HEAD FINDINGS POSTERIOR CIRCULATION: --Vertebral arteries: Loss of flow related enhancement within the left V4 segment. Normal left PICA. --Inferior cerebellar arteries: Normal. --Basilar artery: Normal. --Superior cerebellar arteries: Normal. --Posterior cerebral arteries: Normal. The right PCA is predominantly supplied by the posterior communicating artery. ANTERIOR CIRCULATION: --Intracranial internal carotid arteries: Atherosclerotic irregularity of the internal carotid arteries at the skull base, left-greater-than-right. --Anterior  cerebral arteries (ACA): Normal. --Middle cerebral arteries (MCA): Normal. MRA NECK FINDINGS Motion degraded MRA of the neck. Aortic arch: Unremarkable Right carotid system: There is signal loss of the distal common carotid artery. The right ICA is patent. Left carotid system: There is atherosclerotic irregularity throughout the left carotid system with an area of signal loss near the carotid bifurcation. Vertebral arteries: Right dominant.  No stenosis. IMPRESSION: 1. Multiple scattered bilateral small acute infarcts. The largest lesions are located in the right cerebellum, left temporal lobe and left basal ganglia. No hemorrhage or mass effect. 2. Loss of flow related enhancement within the left vertebral artery V4 segment, consistent with slow flow or occlusion. 3. Signal loss of the distal right common carotid artery and left internal carotid artery near the carotid bifurcation. This may be exaggerated by motion artifact, but there is likely a component of stenosis or occlusion. CTA of the neck may be helpful for further characterization. 4. 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 Electronically Signed   By: Deatra Robinson M.D.   On: 03/12/2023 21:48   CT HEAD CODE STROKE WO CONTRAST  Result Date: 03/12/2023 CLINICAL DATA:  Code stroke. Neuro deficit, acute, stroke suspected. EXAM: CT HEAD WITHOUT CONTRAST TECHNIQUE: Contiguous axial images were obtained from the base of the skull through the vertex without intravenous contrast. RADIATION DOSE REDUCTION: This exam was performed according to the departmental dose-optimization program which includes automated exposure control, adjustment of the mA and/or kV according to patient size and/or use of iterative reconstruction technique. COMPARISON:  MRI brain 01/27/2023. FINDINGS: Brain: No acute intracranial hemorrhage. New area of hypoattenuation in the left inferior temporal gyrus. Unchanged encephalomalacia in the right posterior frontal lobe and basal ganglia from prior right MCA territory infarct. Unchanged old infarct in the right occipital lobe and severe chronic small-vessel disease. No hydrocephalus or extra-axial collection. No mass effect or midline shift. Vascular: No hyperdense vessel or unexpected calcification. Skull: No calvarial fracture or suspicious bone lesion. Skull base is unremarkable. Sinuses/Orbits: Unremarkable. Other: None. ASPECTS ([place]berta Stroke Program Early CT Score) - Ganglionic level infarction (caudate, lentiform nuclei, internal capsule, insula, M1-M3 cortex): 7 - Supraganglionic infarction (M4-M6 cortex): 3 Total score (0-10 with 10 being normal): 10 IMPRESSION: 1. New area of hypoattenuation in the left inferior temporal gyrus, concerning for acute infarct. No acute intracranial hemorrhage. 2. ASPECT score is 10. Code stroke imaging results were communicated on 03/12/2023 at 4:44 pm to provider Dr. Amada Jupiter via secure text paging. Electronically Signed   By: Orvan Falconer M.D.   On: 03/12/2023 16:45   US Venous Img Upper Uni Right(DVT)  Result Date: 03/12/2023 CLINICAL DATA:  Right  upper extremity pain and edema. EXAM: RIGHT UPPER EXTREMITY VENOUS DOPPLER ULTRASOUND TECHNIQUE: Gray-scale sonography with graded compression, as well as color Doppler and duplex ultrasound were performed to evaluate the upper extremity deep venous system from the level of the subclavian vein and including the jugular, axillary, basilic, radial, ulnar and upper cephalic vein. Spectral Doppler was utilized to evaluate flow at rest and with distal augmentation maneuvers. COMPARISON:  None Available. FINDINGS: Contralateral Subclavian Vein: Respiratory phasicity is normal and symmetric with the symptomatic side. No evidence of thrombus. Normal compressibility. Internal Jugular Vein: No evidence of thrombus. Normal compressibility, respiratory phasicity and response to augmentation. Subclavian Vein: No evidence of thrombus. Normal compressibility, respiratory phasicity and response to augmentation. Axillary Vein: No evidence of thrombus. Normal compressibility, respiratory phasicity and response to  augmentation. Cephalic Vein: Superficial thrombophlebitis of a segment of the right cephalic vein in the forearm with associated venous distension. Basilic Vein: No evidence of thrombus. Normal compressibility, respiratory phasicity and response to augmentation. Brachial Veins: No evidence of thrombus. Normal compressibility, respiratory phasicity and response to augmentation. Radial Veins: No evidence of thrombus. Normal compressibility, respiratory phasicity and response to augmentation. Ulnar Veins: No evidence of thrombus. Normal compressibility, respiratory phasicity and response to augmentation. Venous Reflux:  None visualized. Other Findings:  No abnormal fluid collections identified. IMPRESSION: 1. No evidence of DVT within the right upper extremity. 2. Superficial thrombophlebitis of a segment of the right cephalic vein in the forearm. Electronically Signed   By: Irish Lack M.D.   On: 03/12/2023 12:24   US  RENAL  Result Date: 03/11/2023 CLINICAL DATA:  6274 Acute kidney failure, unspecified (HCC) [id number] Chronic kidney disease (CKD) stage G3a/A1, moderately decreased glomerular filtration rate (GFR) between 45-59 mL/min EXAM: RENAL / URINARY TRACT ULTRASOUND COMPLETE COMPARISON:  03/08/2023 FINDINGS: Right Kidney: Renal measurements: 9.6 x 4.3 x 5.2 cm = volume: 109 mL. Parenchyma is hypoechoic compared to adjacent liver. 1.6 x 1.5 cm cortical cyst partially exophytic mid kidney. No hydronephrosis. Left Kidney: Renal measurements: 10.6 x 5.2 x 5.2 cm = volume: 152 mL. 2.3 x 1.7 x 2 cm simple cyst mid kidney. No hydronephrosis. Bladder: Physiologically distended. Other: None. IMPRESSION: 1. No hydronephrosis. 2. Bilateral simple renal cysts. Electronically Signed   By: Corlis Leak M.D.   On: 03/11/2023 16:20   US RENAL ARTERY DUPLEX LIMITED  Result Date: 03/08/2023 CLINICAL DATA:  Malignant hypertension EXAM: RENAL/URINARY TRACT ULTRASOUND RENAL DUPLEX DOPPLER ULTRASOUND COMPARISON:  CT 04/05/2008 FINDINGS: Right Kidney: Length: 9.8 x 4.8 x 5.3 cm (128 cc). Isoechoic to adjacent liver. 1.6 x 1.3 x 1.2 cm midpole cyst. No hydronephrosis. Left Kidney: Length: 10.6 x 5.5 x 4.7 cm (144 cc). No hydronephrosis. 2.3 x 2 cm lower pole cortical cyst. Bladder:  Not imaged RENAL DUPLEX ULTRASOUND Right Renal Artery Velocities: Origin:  43.8 cm/sec Mid:  47.6 cm/sec Hilum:  43.2 cm/sec Left Renal Artery Velocities: Origin:  70.2 cm/sec Mid:  67.8 cm/sec Hilum:  67.7 cm/sec Aortic Velocity:  141 cm/sec All renal-aortic ratios are well under 1.0. Technologist describes technically difficult study secondary to bowel gas and patient inability to hold breath. IMPRESSION: 1. No Doppler ultrasound evidence of hemodynamically significant renal artery stenosis. 2. If there is continued clinical concern, renal MRA (lower radiation risk, can be performed noncontrast in the setting of renal dysfunction) and CTA ( higher spatial  resolution) represent more accurate studies, which are additionally more sensitive to the detection of duplicated renal arteries. Electronically Signed   By: Corlis Leak M.D.   On: 03/08/2023 18:26   DG Chest Port 1 View  Result Date: 03/08/2023 CLINICAL DATA:  Hypotension EXAM: PORTABLE CHEST 1 VIEW COMPARISON:  08/22/2007 FINDINGS: Single frontal view of the chest demonstrates an enlarged cardiac silhouette. There is atherosclerosis of the thoracic aorta, bilateral subclavian arteries, and bilateral carotid arteries. No acute airspace disease, effusion, or pneumothorax. No acute bony abnormalities. IMPRESSION: 1. Enlarged cardiac silhouette. 2. No acute airspace disease. 3. Extensive atherosclerosis. Electronically Signed   By: Sharlet Salina M.D.   On: 03/08/2023 14:53   (Echo, Carotid, EGD, Colonoscopy, ERCP)    Subjective: Pt c/o fatigue    Discharge Exam: Vitals:   03/22/23 0736 03/22/23 1132  BP: (!) 167/52 (!) 171/72  Pulse: (!) 52 (!) 57  Resp:  17 18  Temp: 97.6 F (36.4 C)   SpO2: 98% 98%   Vitals:   03/22/23 0156 03/22/23 0433 03/22/23 0736 03/22/23 1132  BP: (!) 158/64 133/75 (!) 167/52 (!) 171/72  Pulse: (!) 107 (!) 51 (!) 52 (!) 57  Resp:  18 17 18   Temp:  98.2 F (36.8 C) 97.6 F (36.4 C)   TempSrc:  Oral    SpO2:  98% 98% 98%  Weight:      Height:        General: Pt is alert, awake, not in acute distress Cardiovascular: S1/S2 +, no rubs, no gallops Respiratory: CTA bilaterally, no wheezing, no rhonchi Abdominal: Soft, NT, ND, bowel sounds + Extremities: no edema, no cyanosis    The results of significant diagnostics from this hospitalization (including imaging, microbiology, ancillary and laboratory) are listed below for reference.     Microbiology: No results found for this or any previous visit (from the past 240 hour(s)).   Labs: BNP (last 3 results) Recent Labs    03/08/23 1437  BNP 433.8*   Basic Metabolic Panel: Recent Labs  Lab  03/18/23 0424 03/19/23 0509 03/20/23 0427 03/21/23 0345 03/21/23 0832 03/22/23 0324  NA 139 140 142  --  140 137  K 3.7 3.4* 4.4  --  4.2 4.0  CL 107 108 111  --  108 107  CO2 24 24 21*  --  23 21*  GLUCOSE 116* 132* 139*  --  118* 106*  BUN 79* 62* 54*  --  54* 60*  CREATININE 1.65* 1.53* 1.53*  --  1.84* 2.19*  CALCIUM 8.5* 8.4* 8.7*  --  8.5* 8.2*  MG  --   --  2.5* 2.4  --  2.3   Liver Function Tests: No results for input(s): "AST", "ALT", "ALKPHOS", "BILITOT", "PROT", "ALBUMIN" in the last 168 hours. No results for input(s): "LIPASE", "AMYLASE" in the last 168 hours. No results for input(s): "AMMONIA" in the last 168 hours. CBC: Recent Labs  Lab 03/20/23 0427 03/21/23 0345 03/22/23 0324  WBC 13.4* 13.0* 13.8*  HGB 10.4* 9.5* 9.5*  HCT 32.4* 30.1* 30.1*  MCV 95.0 94.7 94.7  PLT 318 289 295   Cardiac Enzymes: No results for input(s): "CKTOTAL", "CKMB", "CKMBINDEX", "TROPONINI" in the last 168 hours. BNP: Invalid input(s): "POCBNP" CBG: Recent Labs  Lab 03/21/23 1154 03/21/23 1653 03/21/23 2035 03/22/23 0735 03/22/23 1133  GLUCAP 115* 87 98 105* 170*   D-Dimer No results for input(s): "DDIMER" in the last 72 hours. Hgb A1c No results for input(s): "HGBA1C" in the last 72 hours. Lipid Profile No results for input(s): "CHOL", "HDL", "LDLCALC", "TRIG", "CHOLHDL", "LDLDIRECT" in the last 72 hours. Thyroid function studies No results for input(s): "TSH", "T4TOTAL", "T3FREE", "THYROIDAB" in the last 72 hours.  Invalid input(s): "FREET3" Anemia work up No results for input(s): "VITAMINB12", "FOLATE", "FERRITIN", "TIBC", "IRON", "RETICCTPCT" in the last 72 hours. Urinalysis    Component Value Date/Time   COLORURINE YELLOW (A) 03/11/2023 1637   APPEARANCEUR HAZY (A) 03/11/2023 1637   LABSPEC 1.011 03/11/2023 1637   PHURINE 5.0 03/11/2023 1637   GLUCOSEU NEGATIVE 03/11/2023 1637   HGBUR SMALL (A) 03/11/2023 1637   BILIRUBINUR NEGATIVE 03/11/2023 1637    BILIRUBINUR negative 01/21/2020 1202   KETONESUR NEGATIVE 03/11/2023 1637   PROTEINUR 30 (A) 03/11/2023 1637   UROBILINOGEN 0.2 01/21/2020 1202   NITRITE NEGATIVE 03/11/2023 1637   LEUKOCYTESUR LARGE (A) 03/11/2023 1637   Sepsis Labs Recent Labs  Lab 03/20/23 0427  03/21/23 0345 03/22/23 0324  WBC 13.4* 13.0* 13.8*   Microbiology No results found for this or any previous visit (from the past 240 hour(s)).   Time coordinating discharge: Over 30 minutes  SIGNED:   Charise Killian, MD  [facility] 03/22/2023, 12:00 PM Pager   If 7PM-7AM, please contact night-coverage [email or web address]

## 2023-03-22 NOTE — Progress Notes (Addendum)
Called report to [employer] [employer] # [phone] spoke with Algis Greenhouse, nurse assuming care of pt. IV was removed and pts daughter will be transporting pt to SNF. All belongings were sent with pt and family & she wore her own clothes home. Cardiac monitor in place left chest.

## 2023-03-23 ENCOUNTER — Telehealth: Payer: Self-pay | Admitting: Family Medicine

## 2023-03-23 NOTE — Telephone Encounter (Signed)
Copied from CRM [id number]. Topic: Medicare AWV >> Mar 23, 2023  9:52 AM Payton Doughty wrote: Reason for CRM: LM 03/23/2023 to schedule AWV   Verlee Rossetti; Care Guide Ambulatory Clinical Support [place] [facility] Direct Dial: [phone]

## 2023-03-26 DIAGNOSIS — I6523 Occlusion and stenosis of bilateral carotid arteries: Secondary | ICD-10-CM | POA: Diagnosis not present

## 2023-03-26 DIAGNOSIS — I2510 Atherosclerotic heart disease of native coronary artery without angina pectoris: Secondary | ICD-10-CM | POA: Diagnosis not present

## 2023-03-26 DIAGNOSIS — I4891 Unspecified atrial fibrillation: Secondary | ICD-10-CM | POA: Diagnosis not present

## 2023-03-26 DIAGNOSIS — E7849 Other hyperlipidemia: Secondary | ICD-10-CM | POA: Diagnosis not present

## 2023-03-26 LAB — METANEPHRINES, PLASMA

## 2023-03-27 DIAGNOSIS — N183 Chronic kidney disease, stage 3 unspecified: Secondary | ICD-10-CM | POA: Diagnosis not present

## 2023-03-27 DIAGNOSIS — I10 Essential (primary) hypertension: Secondary | ICD-10-CM | POA: Diagnosis not present

## 2023-03-27 DIAGNOSIS — I161 Hypertensive emergency: Secondary | ICD-10-CM | POA: Diagnosis not present

## 2023-03-27 DIAGNOSIS — N179 Acute kidney failure, unspecified: Secondary | ICD-10-CM | POA: Diagnosis not present

## 2023-03-28 ENCOUNTER — Other Ambulatory Visit (INDEPENDENT_AMBULATORY_CARE_PROVIDER_SITE_OTHER): Payer: Self-pay | Admitting: Vascular Surgery

## 2023-03-28 DIAGNOSIS — I10 Essential (primary) hypertension: Secondary | ICD-10-CM

## 2023-04-06 ENCOUNTER — Ambulatory Visit (INDEPENDENT_AMBULATORY_CARE_PROVIDER_SITE_OTHER): Payer: Medicare Other | Admitting: Vascular Surgery

## 2023-04-06 ENCOUNTER — Encounter (INDEPENDENT_AMBULATORY_CARE_PROVIDER_SITE_OTHER): Payer: Medicare Other

## 2023-04-09 DIAGNOSIS — I4891 Unspecified atrial fibrillation: Secondary | ICD-10-CM | POA: Diagnosis not present

## 2023-04-09 DIAGNOSIS — I160 Hypertensive urgency: Secondary | ICD-10-CM | POA: Diagnosis not present

## 2023-04-09 DIAGNOSIS — Z7982 Long term (current) use of aspirin: Secondary | ICD-10-CM | POA: Diagnosis not present

## 2023-04-09 DIAGNOSIS — J90 Pleural effusion, not elsewhere classified: Secondary | ICD-10-CM | POA: Diagnosis not present

## 2023-04-09 DIAGNOSIS — R0602 Shortness of breath: Secondary | ICD-10-CM | POA: Diagnosis not present

## 2023-04-09 DIAGNOSIS — Z7901 Long term (current) use of anticoagulants: Secondary | ICD-10-CM | POA: Diagnosis not present

## 2023-04-09 DIAGNOSIS — M47894 Other spondylosis, thoracic region: Secondary | ICD-10-CM | POA: Diagnosis not present

## 2023-04-09 DIAGNOSIS — M7989 Other specified soft tissue disorders: Secondary | ICD-10-CM | POA: Diagnosis not present

## 2023-04-09 DIAGNOSIS — I5032 Chronic diastolic (congestive) heart failure: Secondary | ICD-10-CM | POA: Diagnosis not present

## 2023-04-09 DIAGNOSIS — R7989 Other specified abnormal findings of blood chemistry: Secondary | ICD-10-CM | POA: Diagnosis not present

## 2023-04-09 DIAGNOSIS — Z7902 Long term (current) use of antithrombotics/antiplatelets: Secondary | ICD-10-CM | POA: Diagnosis not present

## 2023-04-09 DIAGNOSIS — R590 Localized enlarged lymph nodes: Secondary | ICD-10-CM | POA: Diagnosis not present

## 2023-04-09 DIAGNOSIS — J9811 Atelectasis: Secondary | ICD-10-CM | POA: Diagnosis not present

## 2023-04-09 DIAGNOSIS — R001 Bradycardia, unspecified: Secondary | ICD-10-CM | POA: Diagnosis not present

## 2023-04-09 DIAGNOSIS — Z79899 Other long term (current) drug therapy: Secondary | ICD-10-CM | POA: Diagnosis not present

## 2023-04-09 DIAGNOSIS — I110 Hypertensive heart disease with heart failure: Secondary | ICD-10-CM | POA: Diagnosis not present

## 2023-04-09 DIAGNOSIS — R918 Other nonspecific abnormal finding of lung field: Secondary | ICD-10-CM | POA: Diagnosis not present

## 2023-04-09 DIAGNOSIS — I493 Ventricular premature depolarization: Secondary | ICD-10-CM | POA: Diagnosis not present

## 2023-04-09 DIAGNOSIS — R0600 Dyspnea, unspecified: Secondary | ICD-10-CM | POA: Diagnosis not present

## 2023-04-09 DIAGNOSIS — I69351 Hemiplegia and hemiparesis following cerebral infarction affecting right dominant side: Secondary | ICD-10-CM | POA: Diagnosis not present

## 2023-04-09 DIAGNOSIS — Z5181 Encounter for therapeutic drug level monitoring: Secondary | ICD-10-CM | POA: Diagnosis not present

## 2023-04-10 DIAGNOSIS — R7989 Other specified abnormal findings of blood chemistry: Secondary | ICD-10-CM | POA: Diagnosis not present

## 2023-04-10 DIAGNOSIS — I160 Hypertensive urgency: Secondary | ICD-10-CM | POA: Diagnosis not present

## 2023-04-10 DIAGNOSIS — R0600 Dyspnea, unspecified: Secondary | ICD-10-CM | POA: Diagnosis not present

## 2023-04-10 DIAGNOSIS — R0602 Shortness of breath: Secondary | ICD-10-CM | POA: Diagnosis not present

## 2023-04-11 DIAGNOSIS — N1831 Chronic kidney disease, stage 3a: Secondary | ICD-10-CM | POA: Diagnosis not present

## 2023-04-11 DIAGNOSIS — N179 Acute kidney failure, unspecified: Secondary | ICD-10-CM | POA: Diagnosis not present

## 2023-04-11 DIAGNOSIS — I509 Heart failure, unspecified: Secondary | ICD-10-CM | POA: Diagnosis not present

## 2023-04-11 DIAGNOSIS — I129 Hypertensive chronic kidney disease with stage 1 through stage 4 chronic kidney disease, or unspecified chronic kidney disease: Secondary | ICD-10-CM | POA: Diagnosis not present

## 2023-04-12 ENCOUNTER — Telehealth: Payer: Self-pay | Admitting: Family Medicine

## 2023-04-12 NOTE — Telephone Encounter (Unsigned)
Patient: Karina Leonard Phone: [phone] Provider: Elizabeth Sauer, MD  Patient's daughter Karina Leonard call to called to schedule patient's AWV.  She stated that the patient has been in rehab for heart condition for the last month and want this information relayed to you.  Thanks, Olegario Messier

## 2023-04-16 DIAGNOSIS — I130 Hypertensive heart and chronic kidney disease with heart failure and stage 1 through stage 4 chronic kidney disease, or unspecified chronic kidney disease: Secondary | ICD-10-CM | POA: Diagnosis not present

## 2023-04-16 DIAGNOSIS — G3184 Mild cognitive impairment, so stated: Secondary | ICD-10-CM | POA: Diagnosis not present

## 2023-04-16 DIAGNOSIS — I509 Heart failure, unspecified: Secondary | ICD-10-CM | POA: Diagnosis not present

## 2023-04-16 DIAGNOSIS — Z7901 Long term (current) use of anticoagulants: Secondary | ICD-10-CM | POA: Diagnosis not present

## 2023-04-16 DIAGNOSIS — M5430 Sciatica, unspecified side: Secondary | ICD-10-CM | POA: Diagnosis not present

## 2023-04-16 DIAGNOSIS — N1831 Chronic kidney disease, stage 3a: Secondary | ICD-10-CM | POA: Diagnosis not present

## 2023-04-16 DIAGNOSIS — I4891 Unspecified atrial fibrillation: Secondary | ICD-10-CM | POA: Diagnosis not present

## 2023-04-16 DIAGNOSIS — F32A Depression, unspecified: Secondary | ICD-10-CM | POA: Diagnosis not present

## 2023-04-16 DIAGNOSIS — E785 Hyperlipidemia, unspecified: Secondary | ICD-10-CM | POA: Diagnosis not present

## 2023-04-16 DIAGNOSIS — Z7982 Long term (current) use of aspirin: Secondary | ICD-10-CM | POA: Diagnosis not present

## 2023-04-16 DIAGNOSIS — Z79899 Other long term (current) drug therapy: Secondary | ICD-10-CM | POA: Diagnosis not present

## 2023-04-16 DIAGNOSIS — I69392 Facial weakness following cerebral infarction: Secondary | ICD-10-CM | POA: Diagnosis not present

## 2023-04-16 DIAGNOSIS — I2510 Atherosclerotic heart disease of native coronary artery without angina pectoris: Secondary | ICD-10-CM | POA: Diagnosis not present

## 2023-04-16 DIAGNOSIS — M519 Unspecified thoracic, thoracolumbar and lumbosacral intervertebral disc disorder: Secondary | ICD-10-CM | POA: Diagnosis not present

## 2023-04-16 DIAGNOSIS — K219 Gastro-esophageal reflux disease without esophagitis: Secondary | ICD-10-CM | POA: Diagnosis not present

## 2023-04-16 DIAGNOSIS — E1122 Type 2 diabetes mellitus with diabetic chronic kidney disease: Secondary | ICD-10-CM | POA: Diagnosis not present

## 2023-04-19 DIAGNOSIS — E1122 Type 2 diabetes mellitus with diabetic chronic kidney disease: Secondary | ICD-10-CM | POA: Diagnosis not present

## 2023-04-19 DIAGNOSIS — K219 Gastro-esophageal reflux disease without esophagitis: Secondary | ICD-10-CM | POA: Diagnosis not present

## 2023-04-19 DIAGNOSIS — M5430 Sciatica, unspecified side: Secondary | ICD-10-CM | POA: Diagnosis not present

## 2023-04-19 DIAGNOSIS — I509 Heart failure, unspecified: Secondary | ICD-10-CM | POA: Diagnosis not present

## 2023-04-19 DIAGNOSIS — Z7901 Long term (current) use of anticoagulants: Secondary | ICD-10-CM | POA: Diagnosis not present

## 2023-04-19 DIAGNOSIS — F32A Depression, unspecified: Secondary | ICD-10-CM | POA: Diagnosis not present

## 2023-04-19 DIAGNOSIS — G3184 Mild cognitive impairment, so stated: Secondary | ICD-10-CM | POA: Diagnosis not present

## 2023-04-19 DIAGNOSIS — I69392 Facial weakness following cerebral infarction: Secondary | ICD-10-CM | POA: Diagnosis not present

## 2023-04-19 DIAGNOSIS — Z7982 Long term (current) use of aspirin: Secondary | ICD-10-CM | POA: Diagnosis not present

## 2023-04-19 DIAGNOSIS — I130 Hypertensive heart and chronic kidney disease with heart failure and stage 1 through stage 4 chronic kidney disease, or unspecified chronic kidney disease: Secondary | ICD-10-CM | POA: Diagnosis not present

## 2023-04-19 DIAGNOSIS — I4891 Unspecified atrial fibrillation: Secondary | ICD-10-CM | POA: Diagnosis not present

## 2023-04-19 DIAGNOSIS — E785 Hyperlipidemia, unspecified: Secondary | ICD-10-CM | POA: Diagnosis not present

## 2023-04-19 DIAGNOSIS — N1831 Chronic kidney disease, stage 3a: Secondary | ICD-10-CM | POA: Diagnosis not present

## 2023-04-19 DIAGNOSIS — I2510 Atherosclerotic heart disease of native coronary artery without angina pectoris: Secondary | ICD-10-CM | POA: Diagnosis not present

## 2023-04-19 DIAGNOSIS — Z79899 Other long term (current) drug therapy: Secondary | ICD-10-CM | POA: Diagnosis not present

## 2023-04-19 DIAGNOSIS — M519 Unspecified thoracic, thoracolumbar and lumbosacral intervertebral disc disorder: Secondary | ICD-10-CM | POA: Diagnosis not present

## 2023-04-23 ENCOUNTER — Telehealth: Payer: Self-pay | Admitting: Family Medicine

## 2023-04-23 NOTE — Telephone Encounter (Signed)
Shireen Miller from [facility] stated that the patient's daughter said she has two procedures coming up: one tomorrow and one on Wednesday.  Requesting that the that the evaluation be completed next week for OT. Please advise.

## 2023-04-24 ENCOUNTER — Telehealth: Payer: Self-pay

## 2023-04-24 DIAGNOSIS — N181 Chronic kidney disease, stage 1: Secondary | ICD-10-CM | POA: Diagnosis not present

## 2023-04-24 DIAGNOSIS — I2510 Atherosclerotic heart disease of native coronary artery without angina pectoris: Secondary | ICD-10-CM | POA: Diagnosis not present

## 2023-04-24 DIAGNOSIS — I639 Cerebral infarction, unspecified: Secondary | ICD-10-CM | POA: Diagnosis not present

## 2023-04-24 DIAGNOSIS — I4819 Other persistent atrial fibrillation: Secondary | ICD-10-CM | POA: Diagnosis not present

## 2023-04-24 DIAGNOSIS — I5032 Chronic diastolic (congestive) heart failure: Secondary | ICD-10-CM | POA: Diagnosis not present

## 2023-04-24 DIAGNOSIS — I10 Essential (primary) hypertension: Secondary | ICD-10-CM | POA: Diagnosis not present

## 2023-04-24 NOTE — Telephone Encounter (Signed)
Returned [employer] call from [employer] [phone]- she stated pt will be out of town this week, so family wanted to mover her evaluation out to next week (FYI call)

## 2023-04-25 ENCOUNTER — Ambulatory Visit (INDEPENDENT_AMBULATORY_CARE_PROVIDER_SITE_OTHER): Payer: Medicare Other

## 2023-04-25 ENCOUNTER — Encounter (INDEPENDENT_AMBULATORY_CARE_PROVIDER_SITE_OTHER): Payer: Self-pay | Admitting: Nurse Practitioner

## 2023-04-25 ENCOUNTER — Ambulatory Visit (INDEPENDENT_AMBULATORY_CARE_PROVIDER_SITE_OTHER): Payer: Medicare Other | Admitting: Nurse Practitioner

## 2023-04-25 VITALS — BP 134/60 | HR 62 | Resp 16 | Wt 174.6 lb

## 2023-04-25 DIAGNOSIS — I701 Atherosclerosis of renal artery: Secondary | ICD-10-CM | POA: Diagnosis not present

## 2023-04-25 DIAGNOSIS — I6523 Occlusion and stenosis of bilateral carotid arteries: Secondary | ICD-10-CM

## 2023-04-25 DIAGNOSIS — I639 Cerebral infarction, unspecified: Secondary | ICD-10-CM | POA: Diagnosis not present

## 2023-04-25 DIAGNOSIS — I10 Essential (primary) hypertension: Secondary | ICD-10-CM

## 2023-04-25 DIAGNOSIS — E119 Type 2 diabetes mellitus without complications: Secondary | ICD-10-CM | POA: Diagnosis not present

## 2023-04-25 NOTE — Patient Instructions (Signed)
Maintain BP log and send in prior to next follow up via MyChart and we will also get bloodwork the day prior to follow up

## 2023-04-26 ENCOUNTER — Encounter (INDEPENDENT_AMBULATORY_CARE_PROVIDER_SITE_OTHER): Payer: Self-pay | Admitting: Nurse Practitioner

## 2023-04-26 NOTE — Progress Notes (Signed)
Subjective:    Patient ID: Karina Leonard, female    DOB: [birth year], 82 y.o.   MRN: [id number] Chief Complaint  Patient presents with   Follow-up    2 week with renal artery duplex    Karina Leonard is an 82 year old female who presents today following recent hospitalization regarding carotid endarterectomy.  She was scheduled to have surgery on 03/08/2023 however at presentation her blood pressure was extremely elevated with her systolic blood pressure in the 220s.  During this hospitalization the patient had a CVA on 03/12/2023.  She is doing well with some left lower extremity weakness.  She also developed atrial fibrillation during her hospitalization and started on Eliquis.  She is also recently been hospitalized on 04/09/2023 to 04/10/2023 due to hypertension and diuresis.  She was treated with 80 mg IV Lasix and her noted lower extremity swelling has improved.  She has had some recent medication changes with her losartan increased to 100 mg daily and spironolactone increased to 25 mg daily.  She has continued to keep a log of her blood pressures which are still trending primarily in the 170s.  I have included a picture of her blood pressure log.  Additionally the patient's creatinine has continued to trend up.  Blood work on 04/24/2023 reveals a creatinine of 2.6, on 03/22/2023 was 2.19, 03/21/2023 it was 1.84.  Today she returns for renal artery duplex in order to evaluate for possible renal artery hypertension.  Today she has a 1 to 59% stenosis of the right renal artery.  The left renal artery has no evidence of stenosis but with renal cyst noted.  The renal cyst were previously noted on ultrasound done 03/08/2023.  However in stark contrast no evidence of stenosis was detected in the right renal artery.  There is a stark difference in velocities between the 2 studies.    Review of Systems  Cardiovascular:  Positive for leg swelling.  All other systems reviewed and are negative.       Objective:   Physical Exam Vitals reviewed.  HENT:     Head: Normocephalic.  Neck:     Vascular: Carotid bruit present.  Cardiovascular:     Rate and Rhythm: Normal rate.     Pulses:          Radial pulses are 2+ on the right side and 2+ on the left side.  Pulmonary:     Effort: Pulmonary effort is normal.  Musculoskeletal:     Right lower leg: No edema.     Left lower leg: No edema.  Skin:    General: Skin is warm and dry.  Neurological:     Mental Status: She is alert and oriented to person, place, and time.  Psychiatric:        Mood and Affect: Mood normal.        Behavior: Behavior normal.        Thought Content: Thought content normal.        Judgment: Judgment normal.     BP 134/60 (BP Location: Left Arm)   Pulse 62   Resp 16   Wt 174 lb 9.6 oz (79.2 kg)   BMI 27.35 kg/m   Past Medical History:  Diagnosis Date   Allergy    Aortic atherosclerosis (HCC)    Atrial fibrillation with slow ventricular response (HCC) 02/27/2023   a.) noted on preoperative ECG 02/27/2023; A.fib at 52 bpm with LVH and (+) inferolateral TWIs; b.) CHA2DS2VASc = 7 (age x  2, sex, HTN, CVA/TIA x2, vascular disease history); c.) rate/rhythm maintained on oral metoprolol; chronically antithrombotic therapy (ASA + clopidogrel)   Bilateral carotid artery disease (HCC)    a.) doppler 01/25/2023: 60% RICA, 70% LICA   Bradycardia    Cataract cortical, senile    Cerebrovascular disease    Coronary artery disease involving native coronary artery of native heart without angina pectoris    a.) MPI 05/24/2020: EF 45-50%. Borderline ant/lat defect with border zone redistribution; b.) MPI 01/08/2023: EF 45%; mild-mod mixed ant apical defect c/w infarct vs. ischemia   DDD (degenerative disc disease), lumbar    Depression    Diastolic dysfunction    a.) TTE 01/08/2023: EF >55%, mod LVH, sev LAE, mild MR/TR/PR, G1DD   Full dentures    Hyperlipidemia    Hypertension    Keloid    Lipoma    Long term  current use of antithrombotics/antiplatelets    a.) on DAPT (ASA + clopidogrel)   Mild cognitive impairment    Pre-diabetes    Sciatica    Stroke [facility])    a.) MRI brain 03/19/2020: multiple old small vessel infarcts of the basal ganglia, thalamus and pons   TIA (transient ischemic attack)    Urinary incontinence     Social History   Socioeconomic History   Marital status: Widowed    Spouse name: Not on file   Number of children: 3   Years of education: some college   Highest education level: 12th grade  Occupational History   Occupation: Retired  Tobacco Use   Smoking status: Former    Packs/day: 1.00    Years: 20.00    Additional pack years: 0.00    Total pack years: 20.00    Types: Cigarettes    Quit date: 43    Years since quitting: 57.5   Smokeless tobacco: Never  Vaping Use   Vaping Use: Never used  Substance and Sexual Activity   Alcohol use: Yes    Alcohol/week: 0.0 standard drinks of alcohol    Comment: special occasions   Drug use: No   Sexual activity: Never  Other Topics Concern   Not on file  Social History Narrative   Lives alone   Social Determinants of Health   Financial Resource Strain: Low Risk  (03/22/2022)   Overall Financial Resource Strain (CARDIA)    Difficulty of Paying Living Expenses: Not hard at all  Food Insecurity: No Food Insecurity (03/08/2023)   Hunger Vital Sign    Worried About Running Out of Food in the Last Year: Never true    Ran Out of Food in the Last Year: Never true  Transportation Needs: No Transportation Needs (03/08/2023)   PRAPARE - Administrator, Civil Service (Medical): No    Lack of Transportation (Non-Medical): No  Physical Activity: Insufficiently Active (03/22/2022)   Exercise Vital Sign    Days of Exercise per Week: 3 days    Minutes of Exercise per Session: 30 min  Stress: No Stress Concern Present (03/22/2022)   [employer] of Occupational Health - Occupational Stress Questionnaire     Feeling of Stress : Not at all  Social Connections: Moderately Isolated (03/22/2022)   Social Connection and Isolation Panel [NHANES]    Frequency of Communication with Friends and Family: More than three times a week    Frequency of Social Gatherings with Friends and Family: Twice a week    Attends Religious Services: More than 4 times per year  Active Member of Clubs or Organizations: No    Attends Banker Meetings: Never    Marital Status: Widowed  Intimate Partner Violence: Not At Risk (03/08/2023)   Humiliation, Afraid, Rape, and Kick questionnaire    Fear of Current or Ex-Partner: No    Emotionally Abused: No    Physically Abused: No    Sexually Abused: No    Past Surgical History:  Procedure Laterality Date   CHOLECYSTECTOMY  2009   COLONOSCOPY     RECTAL POLYPECTOMY     benign    Family History  Problem Relation Age of Onset   Cancer Mother        uterine or ovarian. Unable to remember   Cancer Father        lung   Heart attack Sister     Allergies  Allergen Reactions   Amoxicillin Hives   Donepezil Diarrhea   Nicardipine Hcl In Dextrose Other (See Comments)    Patient vasovagal response on low dose cardene   Penicillins Hives       Latest Ref Rng & Units 03/22/2023    3:24 AM 03/21/2023    3:45 AM 03/20/2023    4:27 AM  CBC  WBC 4.0 - 10.5 K/uL 13.8  13.0  13.4   Hemoglobin 12.0 - 15.0 g/dL 9.5  9.5  96.0   Hematocrit 36.0 - 46.0 % 30.1  30.1  32.4   Platelets 150 - 400 K/uL 295  289  318       CMP     Component Value Date/Time   NA 137 03/22/2023 0324   NA 143 02/02/2023 1114   K 4.0 03/22/2023 0324   CL 107 03/22/2023 0324   CO2 21 (L) 03/22/2023 0324   GLUCOSE 106 (H) 03/22/2023 0324   BUN 60 (H) 03/22/2023 0324   BUN 16 02/02/2023 1114   CREATININE 2.19 (H) 03/22/2023 0324   CALCIUM 8.2 (L) 03/22/2023 0324   PROT 6.5 03/09/2023 0406   PROT 6.5 02/02/2023 1114   ALBUMIN 2.7 (L) 03/12/2023 0507   ALBUMIN 3.8 02/02/2023 1114    AST 82 (H) 03/09/2023 0406   ALT 37 03/09/2023 0406   ALKPHOS 84 03/09/2023 0406   BILITOT 0.9 03/09/2023 0406   BILITOT 0.3 02/02/2023 1114   EGFR 43 (L) 02/02/2023 1114   GFRNONAA 22 (L) 03/22/2023 0324     No results found.     Assessment & Plan:   1. Bilateral carotid artery stenosis We still have plans to move forward with a left carotid endarterectomy however the patient's blood pressure still remains in a range that is somewhat more elevated than we would like prior to intervention.  Patient will continue to work with nephrology and cardiology regarding her blood pressure control and we will also follow-up the patient in 3 weeks as noted below.  2. Diabetes mellitus without complication (HCC) Continue hypoglycemic medications as already ordered, these medications have been reviewed and there are no changes at this time.  Hgb A1C to be monitored as already arranged by primary service  3. Acute CVA (cerebrovascular accident) [facility]) Patient is progressing well with little deficit.  Continuing to work with PT  4. Renal artery stenosis [facility]) Discussion with the patient and her daughter today regarding next steps.  It is unusual that there is such a discrepancy between the hospital ultrasound with ultrasound done today.  Ultrasound today does note renal artery stenosis, however based on the velocities suggest that the lesion is  not hemodynamically significant.  However, the patient's blood pressure remains elevated with a creatinine that is trending up which brings to the suggestion that the lesion may be significant.  The patient has had multiple recent hospitalizations and she has had several medication changes just done several weeks ago.  Also, given the fact that her creatinine is trending up we will tread carefully in regards to her renal angiogram as this can also worsen kidney function.  Will have the patient return in 3 weeks and continue to keep close progress of her blood  pressures.  We will also have her creatinine drawn prior to her visit in order to reevaluate if we need to progress with her angiogram.    Current Outpatient Medications on File Prior to Visit  Medication Sig Dispense Refill   acetaminophen (TYLENOL) 325 MG tablet Take 325 mg by mouth 2 (two) times daily.     amLODipine (NORVASC) 10 MG tablet Take 1 tablet (10 mg total) by mouth daily. 30 tablet 0   apixaban (ELIQUIS) 2.5 MG TABS tablet Take 1 tablet (2.5 mg total) by mouth 2 (two) times daily. 60 tablet 0   aspirin EC 81 MG tablet Take 81 mg by mouth daily. Take while off of the plavix for surgey     cloNIDine (CATAPRES - DOSED IN MG/24 HR) 0.2 mg/24hr patch Place 0.2 mg onto the skin once a week. Sunday     doxazosin (CARDURA) 2 MG tablet Take 2 mg by mouth as needed. 2 mg by mouth as needed for systolic blood pressure above 161     fexofenadine (ALLEGRA) 180 MG tablet Take 1 tablet (180 mg total) by mouth daily. otc 90 tablet 1   fluticasone (FLONASE) 50 MCG/ACT nasal spray Place 1 spray into both nostrils daily. otc     hydrALAZINE (APRESOLINE) 50 MG tablet Take 1 tablet (50 mg total) by mouth every 8 (eight) hours. 90 tablet 0   loperamide (IMODIUM A-D) 2 MG tablet Take 2 mg by mouth as needed for diarrhea or loose stools.     losartan (COZAAR) 50 MG tablet Take 1 tablet (50 mg total) by mouth daily. (Patient taking differently: Take 100 mg by mouth daily.) 30 tablet 0   lovastatin (MEVACOR) 20 MG tablet TAKE 2 TABLETS EVERY DAY (Patient taking differently: Take 20 mg by mouth daily with supper. TAKE 2 TABLETS EVERY DAY) 180 tablet 1   melatonin 5 MG TABS Take 5 mg by mouth at bedtime as needed.     metoprolol succinate (TOPROL-XL) 50 MG 24 hr tablet Take 1 tablet (50 mg total) by mouth daily. [place] 90 tablet 0   omeprazole (PRILOSEC) 20 MG capsule Take 20 mg by mouth every morning.     spironolactone (ALDACTONE) 25 MG tablet Take 12.5 mg by mouth daily. For 30 days     torsemide  (DEMADEX) 20 MG tablet Take 40 mg by mouth daily. For 30 days     venlafaxine XR (EFFEXOR-XR) 75 MG 24 hr capsule Take 1 capsule (75 mg total) by mouth daily. (Patient taking differently: Take 75 mg by mouth at bedtime.) 90 capsule 1   furosemide (LASIX) 20 MG tablet Take 1 tablet (20 mg total) by mouth daily. Hold this medication until you see cardiology and/or nephrology 30 tablet    pantoprazole (PROTONIX) 40 MG tablet Take 1 tablet (40 mg total) by mouth daily. 30 tablet 0   No current facility-administered medications on file prior to visit.    Patient  Instructions  Maintain BP log and send in prior to next follow up via MyChart and we will also get bloodwork the day prior to follow up  Return in about 3 weeks (around 05/16/2023).   Georgiana Spinner, NP

## 2023-04-27 DIAGNOSIS — F32A Depression, unspecified: Secondary | ICD-10-CM | POA: Diagnosis not present

## 2023-04-27 DIAGNOSIS — I509 Heart failure, unspecified: Secondary | ICD-10-CM | POA: Diagnosis not present

## 2023-04-27 DIAGNOSIS — I2510 Atherosclerotic heart disease of native coronary artery without angina pectoris: Secondary | ICD-10-CM | POA: Diagnosis not present

## 2023-04-27 DIAGNOSIS — I69392 Facial weakness following cerebral infarction: Secondary | ICD-10-CM | POA: Diagnosis not present

## 2023-04-27 DIAGNOSIS — Z7901 Long term (current) use of anticoagulants: Secondary | ICD-10-CM | POA: Diagnosis not present

## 2023-04-27 DIAGNOSIS — N1831 Chronic kidney disease, stage 3a: Secondary | ICD-10-CM | POA: Diagnosis not present

## 2023-04-27 DIAGNOSIS — I4891 Unspecified atrial fibrillation: Secondary | ICD-10-CM | POA: Diagnosis not present

## 2023-04-27 DIAGNOSIS — I130 Hypertensive heart and chronic kidney disease with heart failure and stage 1 through stage 4 chronic kidney disease, or unspecified chronic kidney disease: Secondary | ICD-10-CM | POA: Diagnosis not present

## 2023-04-27 DIAGNOSIS — G3184 Mild cognitive impairment, so stated: Secondary | ICD-10-CM | POA: Diagnosis not present

## 2023-04-27 DIAGNOSIS — K219 Gastro-esophageal reflux disease without esophagitis: Secondary | ICD-10-CM | POA: Diagnosis not present

## 2023-04-27 DIAGNOSIS — E785 Hyperlipidemia, unspecified: Secondary | ICD-10-CM | POA: Diagnosis not present

## 2023-04-27 DIAGNOSIS — M5430 Sciatica, unspecified side: Secondary | ICD-10-CM | POA: Diagnosis not present

## 2023-04-27 DIAGNOSIS — Z7982 Long term (current) use of aspirin: Secondary | ICD-10-CM | POA: Diagnosis not present

## 2023-04-27 DIAGNOSIS — E1122 Type 2 diabetes mellitus with diabetic chronic kidney disease: Secondary | ICD-10-CM | POA: Diagnosis not present

## 2023-04-27 DIAGNOSIS — Z79899 Other long term (current) drug therapy: Secondary | ICD-10-CM | POA: Diagnosis not present

## 2023-04-27 DIAGNOSIS — M519 Unspecified thoracic, thoracolumbar and lumbosacral intervertebral disc disorder: Secondary | ICD-10-CM | POA: Diagnosis not present

## 2023-04-30 ENCOUNTER — Other Ambulatory Visit (INDEPENDENT_AMBULATORY_CARE_PROVIDER_SITE_OTHER): Payer: Self-pay | Admitting: Nurse Practitioner

## 2023-04-30 DIAGNOSIS — I701 Atherosclerosis of renal artery: Secondary | ICD-10-CM

## 2023-05-02 DIAGNOSIS — N179 Acute kidney failure, unspecified: Secondary | ICD-10-CM | POA: Diagnosis not present

## 2023-05-02 DIAGNOSIS — E1122 Type 2 diabetes mellitus with diabetic chronic kidney disease: Secondary | ICD-10-CM | POA: Diagnosis not present

## 2023-05-02 DIAGNOSIS — I129 Hypertensive chronic kidney disease with stage 1 through stage 4 chronic kidney disease, or unspecified chronic kidney disease: Secondary | ICD-10-CM | POA: Diagnosis not present

## 2023-05-02 DIAGNOSIS — I509 Heart failure, unspecified: Secondary | ICD-10-CM | POA: Diagnosis not present

## 2023-05-02 DIAGNOSIS — N1831 Chronic kidney disease, stage 3a: Secondary | ICD-10-CM | POA: Diagnosis not present

## 2023-05-02 DIAGNOSIS — K219 Gastro-esophageal reflux disease without esophagitis: Secondary | ICD-10-CM | POA: Diagnosis not present

## 2023-05-02 DIAGNOSIS — Z79899 Other long term (current) drug therapy: Secondary | ICD-10-CM | POA: Diagnosis not present

## 2023-05-02 DIAGNOSIS — Z7982 Long term (current) use of aspirin: Secondary | ICD-10-CM | POA: Diagnosis not present

## 2023-05-02 DIAGNOSIS — I4891 Unspecified atrial fibrillation: Secondary | ICD-10-CM | POA: Diagnosis not present

## 2023-05-02 DIAGNOSIS — I130 Hypertensive heart and chronic kidney disease with heart failure and stage 1 through stage 4 chronic kidney disease, or unspecified chronic kidney disease: Secondary | ICD-10-CM | POA: Diagnosis not present

## 2023-05-02 DIAGNOSIS — M5430 Sciatica, unspecified side: Secondary | ICD-10-CM | POA: Diagnosis not present

## 2023-05-02 DIAGNOSIS — D631 Anemia in chronic kidney disease: Secondary | ICD-10-CM | POA: Diagnosis not present

## 2023-05-02 DIAGNOSIS — I69392 Facial weakness following cerebral infarction: Secondary | ICD-10-CM | POA: Diagnosis not present

## 2023-05-02 DIAGNOSIS — E785 Hyperlipidemia, unspecified: Secondary | ICD-10-CM | POA: Diagnosis not present

## 2023-05-02 DIAGNOSIS — M519 Unspecified thoracic, thoracolumbar and lumbosacral intervertebral disc disorder: Secondary | ICD-10-CM | POA: Diagnosis not present

## 2023-05-02 DIAGNOSIS — G3184 Mild cognitive impairment, so stated: Secondary | ICD-10-CM | POA: Diagnosis not present

## 2023-05-02 DIAGNOSIS — F32A Depression, unspecified: Secondary | ICD-10-CM | POA: Diagnosis not present

## 2023-05-02 DIAGNOSIS — Z7901 Long term (current) use of anticoagulants: Secondary | ICD-10-CM | POA: Diagnosis not present

## 2023-05-02 DIAGNOSIS — I2510 Atherosclerotic heart disease of native coronary artery without angina pectoris: Secondary | ICD-10-CM | POA: Diagnosis not present

## 2023-05-03 DIAGNOSIS — I2510 Atherosclerotic heart disease of native coronary artery without angina pectoris: Secondary | ICD-10-CM | POA: Diagnosis not present

## 2023-05-03 DIAGNOSIS — G3184 Mild cognitive impairment, so stated: Secondary | ICD-10-CM | POA: Diagnosis not present

## 2023-05-03 DIAGNOSIS — I69392 Facial weakness following cerebral infarction: Secondary | ICD-10-CM | POA: Diagnosis not present

## 2023-05-03 DIAGNOSIS — E1122 Type 2 diabetes mellitus with diabetic chronic kidney disease: Secondary | ICD-10-CM | POA: Diagnosis not present

## 2023-05-03 DIAGNOSIS — M5430 Sciatica, unspecified side: Secondary | ICD-10-CM | POA: Diagnosis not present

## 2023-05-03 DIAGNOSIS — Z79899 Other long term (current) drug therapy: Secondary | ICD-10-CM | POA: Diagnosis not present

## 2023-05-03 DIAGNOSIS — E785 Hyperlipidemia, unspecified: Secondary | ICD-10-CM | POA: Diagnosis not present

## 2023-05-03 DIAGNOSIS — K219 Gastro-esophageal reflux disease without esophagitis: Secondary | ICD-10-CM | POA: Diagnosis not present

## 2023-05-03 DIAGNOSIS — I130 Hypertensive heart and chronic kidney disease with heart failure and stage 1 through stage 4 chronic kidney disease, or unspecified chronic kidney disease: Secondary | ICD-10-CM | POA: Diagnosis not present

## 2023-05-03 DIAGNOSIS — Z7901 Long term (current) use of anticoagulants: Secondary | ICD-10-CM | POA: Diagnosis not present

## 2023-05-03 DIAGNOSIS — M519 Unspecified thoracic, thoracolumbar and lumbosacral intervertebral disc disorder: Secondary | ICD-10-CM | POA: Diagnosis not present

## 2023-05-03 DIAGNOSIS — I4891 Unspecified atrial fibrillation: Secondary | ICD-10-CM | POA: Diagnosis not present

## 2023-05-03 DIAGNOSIS — N1831 Chronic kidney disease, stage 3a: Secondary | ICD-10-CM | POA: Diagnosis not present

## 2023-05-03 DIAGNOSIS — Z7982 Long term (current) use of aspirin: Secondary | ICD-10-CM | POA: Diagnosis not present

## 2023-05-03 DIAGNOSIS — I509 Heart failure, unspecified: Secondary | ICD-10-CM | POA: Diagnosis not present

## 2023-05-03 DIAGNOSIS — F32A Depression, unspecified: Secondary | ICD-10-CM | POA: Diagnosis not present

## 2023-05-09 ENCOUNTER — Ambulatory Visit: Payer: Medicare Other

## 2023-05-09 VITALS — Ht 67.0 in | Wt 174.0 lb

## 2023-05-09 DIAGNOSIS — Z0000 Encounter for general adult medical examination without abnormal findings: Secondary | ICD-10-CM

## 2023-05-09 DIAGNOSIS — I130 Hypertensive heart and chronic kidney disease with heart failure and stage 1 through stage 4 chronic kidney disease, or unspecified chronic kidney disease: Secondary | ICD-10-CM | POA: Diagnosis not present

## 2023-05-09 DIAGNOSIS — M519 Unspecified thoracic, thoracolumbar and lumbosacral intervertebral disc disorder: Secondary | ICD-10-CM | POA: Diagnosis not present

## 2023-05-09 DIAGNOSIS — I69392 Facial weakness following cerebral infarction: Secondary | ICD-10-CM | POA: Diagnosis not present

## 2023-05-09 DIAGNOSIS — Z7982 Long term (current) use of aspirin: Secondary | ICD-10-CM | POA: Diagnosis not present

## 2023-05-09 DIAGNOSIS — N1831 Chronic kidney disease, stage 3a: Secondary | ICD-10-CM | POA: Diagnosis not present

## 2023-05-09 DIAGNOSIS — Z7901 Long term (current) use of anticoagulants: Secondary | ICD-10-CM | POA: Diagnosis not present

## 2023-05-09 DIAGNOSIS — Z79899 Other long term (current) drug therapy: Secondary | ICD-10-CM | POA: Diagnosis not present

## 2023-05-09 DIAGNOSIS — M5430 Sciatica, unspecified side: Secondary | ICD-10-CM | POA: Diagnosis not present

## 2023-05-09 DIAGNOSIS — F32A Depression, unspecified: Secondary | ICD-10-CM | POA: Diagnosis not present

## 2023-05-09 DIAGNOSIS — E785 Hyperlipidemia, unspecified: Secondary | ICD-10-CM | POA: Diagnosis not present

## 2023-05-09 DIAGNOSIS — G3184 Mild cognitive impairment, so stated: Secondary | ICD-10-CM | POA: Diagnosis not present

## 2023-05-09 DIAGNOSIS — K219 Gastro-esophageal reflux disease without esophagitis: Secondary | ICD-10-CM | POA: Diagnosis not present

## 2023-05-09 DIAGNOSIS — I4891 Unspecified atrial fibrillation: Secondary | ICD-10-CM | POA: Diagnosis not present

## 2023-05-09 DIAGNOSIS — E1122 Type 2 diabetes mellitus with diabetic chronic kidney disease: Secondary | ICD-10-CM | POA: Diagnosis not present

## 2023-05-09 DIAGNOSIS — I509 Heart failure, unspecified: Secondary | ICD-10-CM | POA: Diagnosis not present

## 2023-05-09 DIAGNOSIS — I2510 Atherosclerotic heart disease of native coronary artery without angina pectoris: Secondary | ICD-10-CM | POA: Diagnosis not present

## 2023-05-09 NOTE — Patient Instructions (Signed)
Karina Leonard , Thank you for taking time to come for your Medicare Wellness Visit. I appreciate your ongoing commitment to your health goals. Please review the following plan we discussed and let me know if I can assist you in the future.   These are the goals we discussed:  Goals       DIET - EAT MORE FRUITS AND VEGETABLES      DIET - INCREASE WATER INTAKE      Recommend to drink at least 6-8 8oz glasses of water per day.      Weight (lb) < 200 lb (90.7 kg) (pt-stated)      Pt would like to lose at least 15 lbs over the next year.         This is a list of the screening recommended for you and due dates:  Health Maintenance  Topic Date Due   Zoster (Shingles) Vaccine (1 of 2) 02/03/1993   COVID-19 Vaccine (6 - 2023-24 season) 06/23/2022   Flu Shot  05/24/2023   Eye exam for diabetics  06/02/2023   Hemoglobin A1C  09/08/2023   Complete foot exam   02/02/2024   Yearly kidney health urinalysis for diabetes  03/10/2024   Yearly kidney function blood test for diabetes  03/21/2024   Medicare Annual Wellness Visit  05/08/2024   DTaP/Tdap/Td vaccine (2 - Td or Tdap) 07/22/2025   Pneumonia Vaccine  Completed   DEXA scan (bone density measurement)  Completed   HPV Vaccine  Aged Out   Mammogram  Discontinued    Advanced directives: no  Conditions/risks identified: none  Next appointment: Follow up in one year for your annual wellness visit 05/14/24 @ 11:15 am by phone   Preventive Care 65 Years and Older, Female Preventive care refers to lifestyle choices and visits with your health care provider that can promote health and wellness. What does preventive care include? A yearly physical exam. This is also called an annual well check. Dental exams once or twice a year. Routine eye exams. Ask your health care provider how often you should have your eyes checked. Personal lifestyle choices, including: Daily care of your teeth and gums. Regular physical activity. Eating a healthy  diet. Avoiding tobacco and drug use. Limiting alcohol use. Practicing safe sex. Taking low-dose aspirin every day. Taking vitamin and mineral supplements as recommended by your health care provider. What happens during an annual well check? The services and screenings done by your health care provider during your annual well check will depend on your age, overall health, lifestyle risk factors, and family history of disease. Counseling  Your health care provider may ask you questions about your: Alcohol use. Tobacco use. Drug use. Emotional well-being. Home and relationship well-being. Sexual activity. Eating habits. History of falls. Memory and ability to understand (cognition). Work and work Astronomer. Reproductive health. Screening  You may have the following tests or measurements: Height, weight, and BMI. Blood pressure. Lipid and cholesterol levels. These may be checked every 5 years, or more frequently if you are over 19 years old. Skin check. Lung cancer screening. You may have this screening every year starting at age 78 if you have a 30-pack-year history of smoking and currently smoke or have quit within the past 15 years. Fecal occult blood test (FOBT) of the stool. You may have this test every year starting at age 81. Flexible sigmoidoscopy or colonoscopy. You may have a sigmoidoscopy every 5 years or a colonoscopy every 10 years starting at age  50. Hepatitis C blood test. Hepatitis B blood test. Sexually transmitted disease (STD) testing. Diabetes screening. This is done by checking your blood sugar (glucose) after you have not eaten for a while (fasting). You may have this done every 1-3 years. Bone density scan. This is done to screen for osteoporosis. You may have this done starting at age 93. Mammogram. This may be done every 1-2 years. Talk to your health care provider about how often you should have regular mammograms. Talk with your health care provider about  your test results, treatment options, and if necessary, the need for more tests. Vaccines  Your health care provider may recommend certain vaccines, such as: Influenza vaccine. This is recommended every year. Tetanus, diphtheria, and acellular pertussis (Tdap, Td) vaccine. You may need a Td booster every 10 years. Zoster vaccine. You may need this after age 53. Pneumococcal 13-valent conjugate (PCV13) vaccine. One dose is recommended after age 5. Pneumococcal polysaccharide (PPSV23) vaccine. One dose is recommended after age 64. Talk to your health care provider about which screenings and vaccines you need and how often you need them. This information is not intended to replace advice given to you by your health care provider. Make sure you discuss any questions you have with your health care provider. Document Released: 11/05/2015 Document Revised: 06/28/2016 Document Reviewed: 08/10/2015 Elsevier Interactive Patient Education  2017 [employer].  Fall Prevention in the Home Falls can cause injuries. They can happen to people of all ages. There are many things you can do to make your home safe and to help prevent falls. What can I do on the outside of my home? Regularly fix the edges of walkways and driveways and fix any cracks. Remove anything that might make you trip as you walk through a door, such as a raised step or threshold. Trim any bushes or trees on the path to your home. Use bright outdoor lighting. Clear any walking paths of anything that might make someone trip, such as rocks or tools. Regularly check to see if handrails are loose or broken. Make sure that both sides of any steps have handrails. Any raised decks and porches should have guardrails on the edges. Have any leaves, snow, or ice cleared regularly. Use sand or salt on walking paths during winter. Clean up any spills in your garage right away. This includes oil or grease spills. What can I do in the bathroom? Use  night lights. Install grab bars by the toilet and in the tub and shower. Do not use towel bars as grab bars. Use non-skid mats or decals in the tub or shower. If you need to sit down in the shower, use a plastic, non-slip stool. Keep the floor dry. Clean up any water that spills on the floor as soon as it happens. Remove soap buildup in the tub or shower regularly. Attach bath mats securely with double-sided non-slip rug tape. Do not have throw rugs and other things on the floor that can make you trip. What can I do in the bedroom? Use night lights. Make sure that you have a light by your bed that is easy to reach. Do not use any sheets or blankets that are too big for your bed. They should not hang down onto the floor. Have a firm chair that has side arms. You can use this for support while you get dressed. Do not have throw rugs and other things on the floor that can make you trip. What can I do  in the kitchen? Clean up any spills right away. Avoid walking on wet floors. Keep items that you use a lot in easy-to-reach places. If you need to reach something above you, use a strong step stool that has a grab bar. Keep electrical cords out of the way. Do not use floor polish or wax that makes floors slippery. If you must use wax, use non-skid floor wax. Do not have throw rugs and other things on the floor that can make you trip. What can I do with my stairs? Do not leave any items on the stairs. Make sure that there are handrails on both sides of the stairs and use them. Fix handrails that are broken or loose. Make sure that handrails are as long as the stairways. Check any carpeting to make sure that it is firmly attached to the stairs. Fix any carpet that is loose or worn. Avoid having throw rugs at the top or bottom of the stairs. If you do have throw rugs, attach them to the floor with carpet tape. Make sure that you have a light switch at the top of the stairs and the bottom of the  stairs. If you do not have them, ask someone to add them for you. What else can I do to help prevent falls? Wear shoes that: Do not have high heels. Have rubber bottoms. Are comfortable and fit you well. Are closed at the toe. Do not wear sandals. If you use a stepladder: Make sure that it is fully opened. Do not climb a closed stepladder. Make sure that both sides of the stepladder are locked into place. Ask someone to hold it for you, if possible. Clearly mark and make sure that you can see: Any grab bars or handrails. First and last steps. Where the edge of each step is. Use tools that help you move around (mobility aids) if they are needed. These include: Canes. Walkers. Scooters. Crutches. Turn on the lights when you go into a dark area. Replace any light bulbs as soon as they burn out. Set up your furniture so you have a clear path. Avoid moving your furniture around. If any of your floors are uneven, fix them. If there are any pets around you, be aware of where they are. Review your medicines with your doctor. Some medicines can make you feel dizzy. This can increase your chance of falling. Ask your doctor what other things that you can do to help prevent falls. This information is not intended to replace advice given to you by your health care provider. Make sure you discuss any questions you have with your health care provider. Document Released: 08/05/2009 Document Revised: 03/16/2016 Document Reviewed: 11/13/2014 Elsevier Interactive Patient Education  2017 [employer].

## 2023-05-09 NOTE — Progress Notes (Signed)
Subjective:   Karina Leonard is a 82 y.o. female who presents for Medicare Annual (Subsequent) preventive examination.  Per patient no change in vitals since last visit, unable to obtain new vitals due to telehealth visit   Visit Complete: Virtual  I connected with  Karina Leonard on 05/09/23 by a audio enabled telemedicine application and verified that I am speaking with the correct person using two identifiers.  Patient Location: Home  Provider Location: Office/Clinic  I discussed the limitations of evaluation and management by telemedicine. The patient expressed understanding and agreed to proceed.   Review of Systems     Cardiac Risk Factors include: advanced age (>23men, >43 women);hypertension;dyslipidemia;sedentary lifestyle     Objective:    Today's Vitals   05/09/23 1352  Weight: 174 lb (78.9 kg)  Height: 5\' 7"  (1.702 m)   Body mass index is 27.25 kg/m.     05/09/2023    1:42 PM 03/08/2023    6:23 AM 02/27/2023   12:17 PM 03/22/2022    3:45 PM 04/09/2020    5:18 PM 04/09/2020    9:48 AM 02/04/2020    3:26 PM  Advanced Directives  Does Patient Have a Medical Advance Directive? No No No No No No No  Would patient like information on creating a medical advance directive? No - Patient declined No - Patient declined  No - Patient declined  No - Patient declined No - Patient declined    Current Medications (verified) Outpatient Encounter Medications as of 05/09/2023  Medication Sig   acetaminophen (TYLENOL) 325 MG tablet Take 325 mg by mouth 2 (two) times daily.   aspirin EC 81 MG tablet Take 81 mg by mouth daily. Take while off of the plavix for surgey   cloNIDine (CATAPRES - DOSED IN MG/24 HR) 0.2 mg/24hr patch Place 0.2 mg onto the skin once a week. Sunday   fexofenadine (ALLEGRA) 180 MG tablet Take 1 tablet (180 mg total) by mouth daily. otc   fluticasone (FLONASE) 50 MCG/ACT nasal spray Place 1 spray into both nostrils daily. otc   loperamide (IMODIUM A-D) 2  MG tablet Take 2 mg by mouth as needed for diarrhea or loose stools.   lovastatin (MEVACOR) 20 MG tablet TAKE 2 TABLETS EVERY DAY (Patient taking differently: Take 20 mg by mouth daily with supper. TAKE 2 TABLETS EVERY DAY)   metoprolol succinate (TOPROL-XL) 50 MG 24 hr tablet Take 1 tablet (50 mg total) by mouth daily. [place]   spironolactone (ALDACTONE) 25 MG tablet Take 12.5 mg by mouth daily. For 30 days   torsemide (DEMADEX) 20 MG tablet Take 40 mg by mouth daily. For 30 days   venlafaxine XR (EFFEXOR-XR) 75 MG 24 hr capsule Take 1 capsule (75 mg total) by mouth daily. (Patient taking differently: Take 75 mg by mouth at bedtime.)   amLODipine (NORVASC) 10 MG tablet Take 1 tablet (10 mg total) by mouth daily.   apixaban (ELIQUIS) 2.5 MG TABS tablet Take 1 tablet (2.5 mg total) by mouth 2 (two) times daily.   doxazosin (CARDURA) 2 MG tablet Take 2 mg by mouth as needed. 2 mg by mouth as needed for systolic blood pressure above 161 (Patient not taking: Reported on 05/09/2023)   furosemide (LASIX) 20 MG tablet Take 1 tablet (20 mg total) by mouth daily. Hold this medication until you see cardiology and/or nephrology (Patient not taking: Reported on 05/09/2023)   hydrALAZINE (APRESOLINE) 50 MG tablet Take 1 tablet (50 mg total) by mouth  every 8 (eight) hours.   losartan (COZAAR) 50 MG tablet Take 1 tablet (50 mg total) by mouth daily. (Patient taking differently: Take 100 mg by mouth daily.)   melatonin 5 MG TABS Take 5 mg by mouth at bedtime as needed. (Patient not taking: Reported on 05/09/2023)   omeprazole (PRILOSEC) 20 MG capsule Take 20 mg by mouth every morning. (Patient not taking: Reported on 05/09/2023)   pantoprazole (PROTONIX) 40 MG tablet Take 1 tablet (40 mg total) by mouth daily.   No facility-administered encounter medications on file as of 05/09/2023.    Allergies (verified) Amoxicillin, Donepezil, Nicardipine hcl in dextrose, and Penicillins   History: Past Medical History:   Diagnosis Date   Allergy    Aortic atherosclerosis (HCC)    Atrial fibrillation with slow ventricular response (HCC) 02/27/2023   a.) noted on preoperative ECG 02/27/2023; A.fib at 52 bpm with LVH and (+) inferolateral TWIs; b.) CHA2DS2VASc = 7 (age x 2, sex, HTN, CVA/TIA x2, vascular disease history); c.) rate/rhythm maintained on oral metoprolol; chronically antithrombotic therapy (ASA + clopidogrel)   Bilateral carotid artery disease (HCC)    a.) doppler 01/25/2023: 60% RICA, 70% LICA   Bradycardia    Cataract cortical, senile    Cerebrovascular disease    Coronary artery disease involving native coronary artery of native heart without angina pectoris    a.) MPI 05/24/2020: EF 45-50%. Borderline ant/lat defect with border zone redistribution; b.) MPI 01/08/2023: EF 45%; mild-mod mixed ant apical defect c/w infarct vs. ischemia   DDD (degenerative disc disease), lumbar    Depression    Diastolic dysfunction    a.) TTE 01/08/2023: EF >55%, mod LVH, sev LAE, mild MR/TR/PR, G1DD   Full dentures    Hyperlipidemia    Hypertension    Keloid    Lipoma    Long term current use of antithrombotics/antiplatelets    a.) on DAPT (ASA + clopidogrel)   Mild cognitive impairment    Pre-diabetes    Sciatica    Stroke [facility])    a.) MRI brain 03/19/2020: multiple old small vessel infarcts of the basal ganglia, thalamus and pons   TIA (transient ischemic attack)    Urinary incontinence    Past Surgical History:  Procedure Laterality Date   CHOLECYSTECTOMY  2009   COLONOSCOPY     RECTAL POLYPECTOMY     benign   Family History  Problem Relation Age of Onset   Cancer Mother        uterine or ovarian. Unable to remember   Cancer Father        lung   Heart attack Sister    Social History   Socioeconomic History   Marital status: Widowed    Spouse name: Not on file   Number of children: 3   Years of education: some college   Highest education level: 12th grade  Occupational History    Occupation: Retired  Tobacco Use   Smoking status: Former    Current packs/day: 0.00    Average packs/day: 1 pack/day for 20.0 years (20.0 ttl pk-yrs)    Types: Cigarettes    Start date: 98    Quit date: 76    Years since quitting: 57.5   Smokeless tobacco: Never  Vaping Use   Vaping status: Never Used  Substance and Sexual Activity   Alcohol use: Yes    Alcohol/week: 0.0 standard drinks of alcohol    Comment: special occasions   Drug use: No   Sexual activity: Never  Other Topics Concern   Not on file  Social History Narrative   Lives alone   Social Determinants of Health   Financial Resource Strain: Low Risk  (05/09/2023)   Overall Financial Resource Strain (CARDIA)    Difficulty of Paying Living Expenses: Not hard at all  Food Insecurity: No Food Insecurity (05/09/2023)   Hunger Vital Sign    Worried About Running Out of Food in the Last Year: Never true    Ran Out of Food in the Last Year: Never true  Transportation Needs: No Transportation Needs (05/09/2023)   PRAPARE - Administrator, Civil Service (Medical): No    Lack of Transportation (Non-Medical): No  Physical Activity: Insufficiently Active (05/09/2023)   Exercise Vital Sign    Days of Exercise per Week: 1 day    Minutes of Exercise per Session: 60 min  Stress: No Stress Concern Present (05/09/2023)   [employer] of Occupational Health - Occupational Stress Questionnaire    Feeling of Stress : Not at all  Social Connections: Moderately Isolated (05/09/2023)   Social Connection and Isolation Panel [NHANES]    Frequency of Communication with Friends and Family: More than three times a week    Frequency of Social Gatherings with Friends and Family: More than three times a week    Attends Religious Services: More than 4 times per year    Active Member of [employer] or Organizations: No    Attends Banker Meetings: Never    Marital Status: Widowed    Tobacco Counseling Counseling  given: Not Answered   Clinical Intake:  Pre-visit preparation completed: Yes  Pain : No/denies pain     Nutritional Risks: None Diabetes: No  How often do you need to have someone help you when you read instructions, pamphlets, or other written materials from your doctor or pharmacy?: 1 - Never  Interpreter Needed?: No  Comments: had a stroke recently Information entered by :: Kennedy Bucker, LPN   Activities of Daily Living    05/09/2023    1:43 PM 03/08/2023   11:00 AM  In your present state of health, do you have any difficulty performing the following activities:  Hearing? 0 0  Vision? 0 0  Difficulty concentrating or making decisions? 0 0  Walking or climbing stairs? 1 0  Dressing or bathing? 1 0  Doing errands, shopping? 1   Preparing Food and eating ? N   Using the Toilet? N   In the past six months, have you accidently leaked urine? N   Do you have problems with loss of bowel control? N   Managing your Medications? Y   Managing your Finances? N   Housekeeping or managing your Housekeeping? Y     Patient Care Team: Duanne Limerick, MD as PCP - General (Family Medicine)  Indicate any recent Medical Services you may have received from other than [facility] providers in the past year (date may be approximate).     Assessment:   This is a routine wellness examination for Karina Leonard.  Hearing/Vision screen Hearing Screening - Comments:: No aids Vision Screening - Comments:: Wears glasses- [facility]  Dietary issues and exercise activities discussed:     Goals Addressed             This Visit's Progress    DIET - EAT MORE FRUITS AND VEGETABLES         Depression Screen    05/09/2023    1:41 PM 02/02/2023  10:14 AM 08/02/2022   11:03 AM 03/22/2022    3:43 PM 01/31/2022   10:09 AM 08/01/2021   10:17 AM 01/19/2021   10:59 AM  PHQ 2/9 Scores  PHQ - 2 Score 0 0 0 1 0 1 1  PHQ- 9 Score 0 0 0 1 0 2 2    Fall Risk    05/09/2023    1:43 PM 02/02/2023    10:14 AM 08/02/2022   11:03 AM 03/22/2022    3:47 PM 08/01/2021   10:16 AM  Fall Risk   Falls in the past year? 1 0 0 0 0  Number falls in past yr: 0 0 0 0 0  Injury with Fall? 0 0 0 0 0  Risk for fall due to : History of fall(s) No Fall Risks No Fall Risks No Fall Risks No Fall Risks  Follow up Falls evaluation completed;Falls prevention discussed Falls evaluation completed Falls evaluation completed Falls prevention discussed Falls evaluation completed    MEDICARE RISK AT HOME:  Medicare Risk at Home - 05/09/23 1344     Any stairs in or around the home? No    If so, are there any without handrails? No    Home free of loose throw rugs in walkways, pet beds, electrical cords, etc? Yes    Adequate lighting in your home to reduce risk of falls? Yes    Life alert? No    Use of a cane, walker or w/c? Yes   walker now that she had a stroke   Grab bars in the bathroom? Yes    Shower chair or bench in shower? Yes    Elevated toilet seat or a handicapped toilet? Yes             TIMED UP AND GO:  Was the test performed?  No    Cognitive Function:        05/09/2023    1:45 PM 01/24/2018    9:34 AM  6CIT Screen  What Year? 0 points 0 points  What month? 0 points 0 points  What time? 0 points 0 points  Count back from 20 0 points 0 points  Months in reverse 2 points 2 points  Repeat phrase 0 points 2 points  Total Score 2 points 4 points    Immunizations Immunization History  Administered Date(s) Administered   Fluad Quad(high Dose 65+) 07/23/2019, 07/22/2020, 08/01/2021, 08/02/2022   Influenza Split 08/25/2010   Influenza, High Dose Seasonal PF 07/18/2017, 07/22/2018   Influenza,inj,Quad PF,6+ Mos 07/04/2013, 07/21/2014, 07/23/2015, 07/20/2016   Influenza-Unspecified 07/21/2014   PFIZER(Purple Top)SARS-COV-2 Vaccination 11/13/2019, 12/04/2019, 07/27/2020, 02/10/2021, 07/12/2021   PNEUMOCOCCAL CONJUGATE-20 01/19/2021   Pneumococcal Conjugate-13 12/18/2014    Pneumococcal Polysaccharide-23 01/20/2013   Tdap 07/23/2015   Zoster, Live 10/23/2013    TDAP status: Up to date  Flu Vaccine status: Up to date  Pneumococcal vaccine status: Up to date  Covid-19 vaccine status: Completed vaccines  Qualifies for Shingles Vaccine? Yes   Zostavax completed Yes   Shingrix Completed?: No.    Education has been provided regarding the importance of this vaccine. Patient has been advised to call insurance company to determine out of pocket expense if they have not yet received this vaccine. Advised may also receive vaccine at local pharmacy or Health Dept. Verbalized acceptance and understanding.  Screening Tests Health Maintenance  Topic Date Due   Zoster Vaccines- Shingrix (1 of 2) 02/03/1993   COVID-19 Vaccine (6 - 2023-24 season) 06/23/2022   INFLUENZA  VACCINE  05/24/2023   OPHTHALMOLOGY EXAM  06/02/2023   HEMOGLOBIN A1C  09/08/2023   FOOT EXAM  02/02/2024   Diabetic kidney evaluation - Urine ACR  03/10/2024   Diabetic kidney evaluation - eGFR measurement  03/21/2024   Medicare Annual Wellness (AWV)  05/08/2024   DTaP/Tdap/Td (2 - Td or Tdap) 07/22/2025   Pneumonia Vaccine 82+ Years old  Completed   DEXA SCAN  Completed   HPV VACCINES  Aged Out   MAMMOGRAM  Discontinued    Health Maintenance  Health Maintenance Due  Topic Date Due   Zoster Vaccines- Shingrix (1 of 2) 02/03/1993   COVID-19 Vaccine (6 - 2023-24 season) 06/23/2022    Colorectal cancer screening: No longer required.   Mammogram status: No longer required due to age.  Declined referral for BDS for now  Lung Cancer Screening: (Low Dose CT Chest recommended if Age 70-80 years, 20 pack-year currently smoking OR have quit w/in 15years.) does not qualify.   Additional Screening:  Hepatitis C Screening: does not qualify; Completed no  Vision Screening: Recommended annual ophthalmology exams for early detection of glaucoma and other disorders of the eye. Is the patient up to  date with their annual eye exam?  Yes  Who is the provider or what is the name of the office in which the patient attends annual eye exams? [facility] If pt is not established with a provider, would they like to be referred to a provider to establish care? No .   Dental Screening: Recommended annual dental exams for proper oral hygiene  Diabetic Foot Exam: Diabetic Foot Exam: Completed 02/02/23  Community Resource Referral / Chronic Care Management: CRR required this visit?  No   CCM required this visit?  No     Plan:     I have personally reviewed and noted the following in the patient's chart:   Medical and social history Use of alcohol, tobacco or illicit drugs  Current medications and supplements including opioid prescriptions. Patient is not currently taking opioid prescriptions. Functional ability and status Nutritional status Physical activity Advanced directives List of other physicians Hospitalizations, surgeries, and ER visits in previous 12 months Vitals Screenings to include cognitive, depression, and falls Referrals and appointments  In addition, I have reviewed and discussed with patient certain preventive protocols, quality metrics, and best practice recommendations. A written personalized care plan for preventive services as well as general preventive health recommendations were provided to patient.     Hal Hope, LPN   [id number]   After Visit Summary: (MyChart) Due to this being a telephonic visit, the after visit summary with patients personalized plan was offered to patient via MyChart   Nurse Notes: none

## 2023-05-10 DIAGNOSIS — I509 Heart failure, unspecified: Secondary | ICD-10-CM | POA: Diagnosis not present

## 2023-05-10 DIAGNOSIS — D631 Anemia in chronic kidney disease: Secondary | ICD-10-CM | POA: Diagnosis not present

## 2023-05-10 DIAGNOSIS — N1831 Chronic kidney disease, stage 3a: Secondary | ICD-10-CM | POA: Diagnosis not present

## 2023-05-10 DIAGNOSIS — I129 Hypertensive chronic kidney disease with stage 1 through stage 4 chronic kidney disease, or unspecified chronic kidney disease: Secondary | ICD-10-CM | POA: Diagnosis not present

## 2023-05-14 ENCOUNTER — Telehealth: Payer: Self-pay

## 2023-05-14 ENCOUNTER — Telehealth: Payer: Self-pay | Admitting: Family Medicine

## 2023-05-14 DIAGNOSIS — I509 Heart failure, unspecified: Secondary | ICD-10-CM | POA: Diagnosis not present

## 2023-05-14 DIAGNOSIS — D631 Anemia in chronic kidney disease: Secondary | ICD-10-CM | POA: Diagnosis not present

## 2023-05-14 DIAGNOSIS — N1831 Chronic kidney disease, stage 3a: Secondary | ICD-10-CM | POA: Diagnosis not present

## 2023-05-14 DIAGNOSIS — I129 Hypertensive chronic kidney disease with stage 1 through stage 4 chronic kidney disease, or unspecified chronic kidney disease: Secondary | ICD-10-CM | POA: Diagnosis not present

## 2023-05-14 NOTE — Telephone Encounter (Signed)
ROUTED

## 2023-05-14 NOTE — Telephone Encounter (Signed)
[facility]/ [phone]- OT orders- gave the okay

## 2023-05-15 DIAGNOSIS — I2510 Atherosclerotic heart disease of native coronary artery without angina pectoris: Secondary | ICD-10-CM | POA: Diagnosis not present

## 2023-05-15 DIAGNOSIS — G3184 Mild cognitive impairment, so stated: Secondary | ICD-10-CM | POA: Diagnosis not present

## 2023-05-15 DIAGNOSIS — E1122 Type 2 diabetes mellitus with diabetic chronic kidney disease: Secondary | ICD-10-CM | POA: Diagnosis not present

## 2023-05-15 DIAGNOSIS — I69392 Facial weakness following cerebral infarction: Secondary | ICD-10-CM | POA: Diagnosis not present

## 2023-05-15 DIAGNOSIS — M5430 Sciatica, unspecified side: Secondary | ICD-10-CM | POA: Diagnosis not present

## 2023-05-15 DIAGNOSIS — I509 Heart failure, unspecified: Secondary | ICD-10-CM | POA: Diagnosis not present

## 2023-05-15 DIAGNOSIS — I130 Hypertensive heart and chronic kidney disease with heart failure and stage 1 through stage 4 chronic kidney disease, or unspecified chronic kidney disease: Secondary | ICD-10-CM | POA: Diagnosis not present

## 2023-05-15 DIAGNOSIS — M519 Unspecified thoracic, thoracolumbar and lumbosacral intervertebral disc disorder: Secondary | ICD-10-CM | POA: Diagnosis not present

## 2023-05-15 DIAGNOSIS — Z7982 Long term (current) use of aspirin: Secondary | ICD-10-CM | POA: Diagnosis not present

## 2023-05-15 DIAGNOSIS — N1831 Chronic kidney disease, stage 3a: Secondary | ICD-10-CM | POA: Diagnosis not present

## 2023-05-15 DIAGNOSIS — I4891 Unspecified atrial fibrillation: Secondary | ICD-10-CM | POA: Diagnosis not present

## 2023-05-15 DIAGNOSIS — E785 Hyperlipidemia, unspecified: Secondary | ICD-10-CM | POA: Diagnosis not present

## 2023-05-15 DIAGNOSIS — F32A Depression, unspecified: Secondary | ICD-10-CM | POA: Diagnosis not present

## 2023-05-15 DIAGNOSIS — Z7901 Long term (current) use of anticoagulants: Secondary | ICD-10-CM | POA: Diagnosis not present

## 2023-05-15 DIAGNOSIS — Z79899 Other long term (current) drug therapy: Secondary | ICD-10-CM | POA: Diagnosis not present

## 2023-05-15 DIAGNOSIS — K219 Gastro-esophageal reflux disease without esophagitis: Secondary | ICD-10-CM | POA: Diagnosis not present

## 2023-05-16 DIAGNOSIS — N1831 Chronic kidney disease, stage 3a: Secondary | ICD-10-CM | POA: Diagnosis not present

## 2023-05-16 DIAGNOSIS — E785 Hyperlipidemia, unspecified: Secondary | ICD-10-CM | POA: Diagnosis not present

## 2023-05-16 DIAGNOSIS — I69392 Facial weakness following cerebral infarction: Secondary | ICD-10-CM | POA: Diagnosis not present

## 2023-05-16 DIAGNOSIS — F32A Depression, unspecified: Secondary | ICD-10-CM | POA: Diagnosis not present

## 2023-05-16 DIAGNOSIS — K219 Gastro-esophageal reflux disease without esophagitis: Secondary | ICD-10-CM | POA: Diagnosis not present

## 2023-05-16 DIAGNOSIS — M5430 Sciatica, unspecified side: Secondary | ICD-10-CM | POA: Diagnosis not present

## 2023-05-16 DIAGNOSIS — I4891 Unspecified atrial fibrillation: Secondary | ICD-10-CM | POA: Diagnosis not present

## 2023-05-16 DIAGNOSIS — G3184 Mild cognitive impairment, so stated: Secondary | ICD-10-CM | POA: Diagnosis not present

## 2023-05-16 DIAGNOSIS — I2510 Atherosclerotic heart disease of native coronary artery without angina pectoris: Secondary | ICD-10-CM | POA: Diagnosis not present

## 2023-05-16 DIAGNOSIS — Z7901 Long term (current) use of anticoagulants: Secondary | ICD-10-CM | POA: Diagnosis not present

## 2023-05-16 DIAGNOSIS — M519 Unspecified thoracic, thoracolumbar and lumbosacral intervertebral disc disorder: Secondary | ICD-10-CM | POA: Diagnosis not present

## 2023-05-16 DIAGNOSIS — Z79899 Other long term (current) drug therapy: Secondary | ICD-10-CM | POA: Diagnosis not present

## 2023-05-16 DIAGNOSIS — E1122 Type 2 diabetes mellitus with diabetic chronic kidney disease: Secondary | ICD-10-CM | POA: Diagnosis not present

## 2023-05-16 DIAGNOSIS — I509 Heart failure, unspecified: Secondary | ICD-10-CM | POA: Diagnosis not present

## 2023-05-16 DIAGNOSIS — Z7982 Long term (current) use of aspirin: Secondary | ICD-10-CM | POA: Diagnosis not present

## 2023-05-16 DIAGNOSIS — I130 Hypertensive heart and chronic kidney disease with heart failure and stage 1 through stage 4 chronic kidney disease, or unspecified chronic kidney disease: Secondary | ICD-10-CM | POA: Diagnosis not present

## 2023-05-18 DIAGNOSIS — I2510 Atherosclerotic heart disease of native coronary artery without angina pectoris: Secondary | ICD-10-CM | POA: Diagnosis not present

## 2023-05-18 DIAGNOSIS — I69392 Facial weakness following cerebral infarction: Secondary | ICD-10-CM | POA: Diagnosis not present

## 2023-05-18 DIAGNOSIS — Z79899 Other long term (current) drug therapy: Secondary | ICD-10-CM | POA: Diagnosis not present

## 2023-05-18 DIAGNOSIS — K219 Gastro-esophageal reflux disease without esophagitis: Secondary | ICD-10-CM | POA: Diagnosis not present

## 2023-05-18 DIAGNOSIS — Z7982 Long term (current) use of aspirin: Secondary | ICD-10-CM | POA: Diagnosis not present

## 2023-05-18 DIAGNOSIS — M519 Unspecified thoracic, thoracolumbar and lumbosacral intervertebral disc disorder: Secondary | ICD-10-CM | POA: Diagnosis not present

## 2023-05-18 DIAGNOSIS — Z7901 Long term (current) use of anticoagulants: Secondary | ICD-10-CM | POA: Diagnosis not present

## 2023-05-18 DIAGNOSIS — N1831 Chronic kidney disease, stage 3a: Secondary | ICD-10-CM | POA: Diagnosis not present

## 2023-05-18 DIAGNOSIS — M5430 Sciatica, unspecified side: Secondary | ICD-10-CM | POA: Diagnosis not present

## 2023-05-18 DIAGNOSIS — E785 Hyperlipidemia, unspecified: Secondary | ICD-10-CM | POA: Diagnosis not present

## 2023-05-18 DIAGNOSIS — I509 Heart failure, unspecified: Secondary | ICD-10-CM | POA: Diagnosis not present

## 2023-05-18 DIAGNOSIS — I4891 Unspecified atrial fibrillation: Secondary | ICD-10-CM | POA: Diagnosis not present

## 2023-05-18 DIAGNOSIS — G3184 Mild cognitive impairment, so stated: Secondary | ICD-10-CM | POA: Diagnosis not present

## 2023-05-18 DIAGNOSIS — I130 Hypertensive heart and chronic kidney disease with heart failure and stage 1 through stage 4 chronic kidney disease, or unspecified chronic kidney disease: Secondary | ICD-10-CM | POA: Diagnosis not present

## 2023-05-18 DIAGNOSIS — E1122 Type 2 diabetes mellitus with diabetic chronic kidney disease: Secondary | ICD-10-CM | POA: Diagnosis not present

## 2023-05-18 DIAGNOSIS — F32A Depression, unspecified: Secondary | ICD-10-CM | POA: Diagnosis not present

## 2023-05-22 DIAGNOSIS — R2689 Other abnormalities of gait and mobility: Secondary | ICD-10-CM | POA: Diagnosis not present

## 2023-05-22 DIAGNOSIS — I639 Cerebral infarction, unspecified: Secondary | ICD-10-CM | POA: Diagnosis not present

## 2023-05-22 DIAGNOSIS — G3184 Mild cognitive impairment, so stated: Secondary | ICD-10-CM | POA: Diagnosis not present

## 2023-05-22 DIAGNOSIS — I4819 Other persistent atrial fibrillation: Secondary | ICD-10-CM | POA: Diagnosis not present

## 2023-05-22 DIAGNOSIS — I6523 Occlusion and stenosis of bilateral carotid arteries: Secondary | ICD-10-CM | POA: Diagnosis not present

## 2023-05-24 DIAGNOSIS — Z7901 Long term (current) use of anticoagulants: Secondary | ICD-10-CM | POA: Diagnosis not present

## 2023-05-24 DIAGNOSIS — E785 Hyperlipidemia, unspecified: Secondary | ICD-10-CM | POA: Diagnosis not present

## 2023-05-24 DIAGNOSIS — M5430 Sciatica, unspecified side: Secondary | ICD-10-CM | POA: Diagnosis not present

## 2023-05-24 DIAGNOSIS — I509 Heart failure, unspecified: Secondary | ICD-10-CM | POA: Diagnosis not present

## 2023-05-24 DIAGNOSIS — K219 Gastro-esophageal reflux disease without esophagitis: Secondary | ICD-10-CM | POA: Diagnosis not present

## 2023-05-24 DIAGNOSIS — N1831 Chronic kidney disease, stage 3a: Secondary | ICD-10-CM | POA: Diagnosis not present

## 2023-05-24 DIAGNOSIS — Z79899 Other long term (current) drug therapy: Secondary | ICD-10-CM | POA: Diagnosis not present

## 2023-05-24 DIAGNOSIS — Z7982 Long term (current) use of aspirin: Secondary | ICD-10-CM | POA: Diagnosis not present

## 2023-05-24 DIAGNOSIS — F32A Depression, unspecified: Secondary | ICD-10-CM | POA: Diagnosis not present

## 2023-05-24 DIAGNOSIS — I2510 Atherosclerotic heart disease of native coronary artery without angina pectoris: Secondary | ICD-10-CM | POA: Diagnosis not present

## 2023-05-24 DIAGNOSIS — I69392 Facial weakness following cerebral infarction: Secondary | ICD-10-CM | POA: Diagnosis not present

## 2023-05-24 DIAGNOSIS — E1122 Type 2 diabetes mellitus with diabetic chronic kidney disease: Secondary | ICD-10-CM | POA: Diagnosis not present

## 2023-05-24 DIAGNOSIS — G3184 Mild cognitive impairment, so stated: Secondary | ICD-10-CM | POA: Diagnosis not present

## 2023-05-24 DIAGNOSIS — M519 Unspecified thoracic, thoracolumbar and lumbosacral intervertebral disc disorder: Secondary | ICD-10-CM | POA: Diagnosis not present

## 2023-05-24 DIAGNOSIS — I130 Hypertensive heart and chronic kidney disease with heart failure and stage 1 through stage 4 chronic kidney disease, or unspecified chronic kidney disease: Secondary | ICD-10-CM | POA: Diagnosis not present

## 2023-05-24 DIAGNOSIS — I4891 Unspecified atrial fibrillation: Secondary | ICD-10-CM | POA: Diagnosis not present

## 2023-05-25 ENCOUNTER — Ambulatory Visit (INDEPENDENT_AMBULATORY_CARE_PROVIDER_SITE_OTHER): Payer: Medicare Other | Admitting: Vascular Surgery

## 2023-05-30 DIAGNOSIS — G3184 Mild cognitive impairment, so stated: Secondary | ICD-10-CM | POA: Diagnosis not present

## 2023-05-30 DIAGNOSIS — M5430 Sciatica, unspecified side: Secondary | ICD-10-CM | POA: Diagnosis not present

## 2023-05-30 DIAGNOSIS — M519 Unspecified thoracic, thoracolumbar and lumbosacral intervertebral disc disorder: Secondary | ICD-10-CM | POA: Diagnosis not present

## 2023-05-30 DIAGNOSIS — Z7901 Long term (current) use of anticoagulants: Secondary | ICD-10-CM | POA: Diagnosis not present

## 2023-05-30 DIAGNOSIS — Z79899 Other long term (current) drug therapy: Secondary | ICD-10-CM | POA: Diagnosis not present

## 2023-05-30 DIAGNOSIS — Z7982 Long term (current) use of aspirin: Secondary | ICD-10-CM | POA: Diagnosis not present

## 2023-05-30 DIAGNOSIS — N1831 Chronic kidney disease, stage 3a: Secondary | ICD-10-CM | POA: Diagnosis not present

## 2023-05-30 DIAGNOSIS — I509 Heart failure, unspecified: Secondary | ICD-10-CM | POA: Diagnosis not present

## 2023-05-30 DIAGNOSIS — I2510 Atherosclerotic heart disease of native coronary artery without angina pectoris: Secondary | ICD-10-CM | POA: Diagnosis not present

## 2023-05-30 DIAGNOSIS — F32A Depression, unspecified: Secondary | ICD-10-CM | POA: Diagnosis not present

## 2023-05-30 DIAGNOSIS — I69392 Facial weakness following cerebral infarction: Secondary | ICD-10-CM | POA: Diagnosis not present

## 2023-05-30 DIAGNOSIS — I130 Hypertensive heart and chronic kidney disease with heart failure and stage 1 through stage 4 chronic kidney disease, or unspecified chronic kidney disease: Secondary | ICD-10-CM | POA: Diagnosis not present

## 2023-05-30 DIAGNOSIS — E1122 Type 2 diabetes mellitus with diabetic chronic kidney disease: Secondary | ICD-10-CM | POA: Diagnosis not present

## 2023-05-30 DIAGNOSIS — E785 Hyperlipidemia, unspecified: Secondary | ICD-10-CM | POA: Diagnosis not present

## 2023-05-30 DIAGNOSIS — I4891 Unspecified atrial fibrillation: Secondary | ICD-10-CM | POA: Diagnosis not present

## 2023-05-30 DIAGNOSIS — K219 Gastro-esophageal reflux disease without esophagitis: Secondary | ICD-10-CM | POA: Diagnosis not present

## 2023-05-31 DIAGNOSIS — I69392 Facial weakness following cerebral infarction: Secondary | ICD-10-CM | POA: Diagnosis not present

## 2023-05-31 DIAGNOSIS — M519 Unspecified thoracic, thoracolumbar and lumbosacral intervertebral disc disorder: Secondary | ICD-10-CM | POA: Diagnosis not present

## 2023-05-31 DIAGNOSIS — Z7901 Long term (current) use of anticoagulants: Secondary | ICD-10-CM | POA: Diagnosis not present

## 2023-05-31 DIAGNOSIS — N1831 Chronic kidney disease, stage 3a: Secondary | ICD-10-CM | POA: Diagnosis not present

## 2023-05-31 DIAGNOSIS — I4891 Unspecified atrial fibrillation: Secondary | ICD-10-CM | POA: Diagnosis not present

## 2023-05-31 DIAGNOSIS — I2510 Atherosclerotic heart disease of native coronary artery without angina pectoris: Secondary | ICD-10-CM | POA: Diagnosis not present

## 2023-05-31 DIAGNOSIS — K219 Gastro-esophageal reflux disease without esophagitis: Secondary | ICD-10-CM | POA: Diagnosis not present

## 2023-05-31 DIAGNOSIS — Z7982 Long term (current) use of aspirin: Secondary | ICD-10-CM | POA: Diagnosis not present

## 2023-05-31 DIAGNOSIS — M5430 Sciatica, unspecified side: Secondary | ICD-10-CM | POA: Diagnosis not present

## 2023-05-31 DIAGNOSIS — Z79899 Other long term (current) drug therapy: Secondary | ICD-10-CM | POA: Diagnosis not present

## 2023-05-31 DIAGNOSIS — F32A Depression, unspecified: Secondary | ICD-10-CM | POA: Diagnosis not present

## 2023-05-31 DIAGNOSIS — I509 Heart failure, unspecified: Secondary | ICD-10-CM | POA: Diagnosis not present

## 2023-05-31 DIAGNOSIS — E785 Hyperlipidemia, unspecified: Secondary | ICD-10-CM | POA: Diagnosis not present

## 2023-05-31 DIAGNOSIS — G3184 Mild cognitive impairment, so stated: Secondary | ICD-10-CM | POA: Diagnosis not present

## 2023-05-31 DIAGNOSIS — E1122 Type 2 diabetes mellitus with diabetic chronic kidney disease: Secondary | ICD-10-CM | POA: Diagnosis not present

## 2023-05-31 DIAGNOSIS — I130 Hypertensive heart and chronic kidney disease with heart failure and stage 1 through stage 4 chronic kidney disease, or unspecified chronic kidney disease: Secondary | ICD-10-CM | POA: Diagnosis not present

## 2023-06-01 DIAGNOSIS — I130 Hypertensive heart and chronic kidney disease with heart failure and stage 1 through stage 4 chronic kidney disease, or unspecified chronic kidney disease: Secondary | ICD-10-CM | POA: Diagnosis not present

## 2023-06-01 DIAGNOSIS — E1122 Type 2 diabetes mellitus with diabetic chronic kidney disease: Secondary | ICD-10-CM | POA: Diagnosis not present

## 2023-06-01 DIAGNOSIS — N1831 Chronic kidney disease, stage 3a: Secondary | ICD-10-CM | POA: Diagnosis not present

## 2023-06-01 DIAGNOSIS — G3184 Mild cognitive impairment, so stated: Secondary | ICD-10-CM | POA: Diagnosis not present

## 2023-06-01 DIAGNOSIS — F32A Depression, unspecified: Secondary | ICD-10-CM | POA: Diagnosis not present

## 2023-06-01 DIAGNOSIS — Z7901 Long term (current) use of anticoagulants: Secondary | ICD-10-CM | POA: Diagnosis not present

## 2023-06-01 DIAGNOSIS — I509 Heart failure, unspecified: Secondary | ICD-10-CM | POA: Diagnosis not present

## 2023-06-01 DIAGNOSIS — I2510 Atherosclerotic heart disease of native coronary artery without angina pectoris: Secondary | ICD-10-CM | POA: Diagnosis not present

## 2023-06-01 DIAGNOSIS — M519 Unspecified thoracic, thoracolumbar and lumbosacral intervertebral disc disorder: Secondary | ICD-10-CM | POA: Diagnosis not present

## 2023-06-01 DIAGNOSIS — I4891 Unspecified atrial fibrillation: Secondary | ICD-10-CM | POA: Diagnosis not present

## 2023-06-01 DIAGNOSIS — I69392 Facial weakness following cerebral infarction: Secondary | ICD-10-CM | POA: Diagnosis not present

## 2023-06-01 DIAGNOSIS — K219 Gastro-esophageal reflux disease without esophagitis: Secondary | ICD-10-CM | POA: Diagnosis not present

## 2023-06-01 DIAGNOSIS — Z79899 Other long term (current) drug therapy: Secondary | ICD-10-CM | POA: Diagnosis not present

## 2023-06-01 DIAGNOSIS — Z7982 Long term (current) use of aspirin: Secondary | ICD-10-CM | POA: Diagnosis not present

## 2023-06-01 DIAGNOSIS — M5430 Sciatica, unspecified side: Secondary | ICD-10-CM | POA: Diagnosis not present

## 2023-06-01 DIAGNOSIS — E785 Hyperlipidemia, unspecified: Secondary | ICD-10-CM | POA: Diagnosis not present

## 2023-06-07 DIAGNOSIS — I4891 Unspecified atrial fibrillation: Secondary | ICD-10-CM | POA: Diagnosis not present

## 2023-06-07 DIAGNOSIS — I69392 Facial weakness following cerebral infarction: Secondary | ICD-10-CM | POA: Diagnosis not present

## 2023-06-07 DIAGNOSIS — Z79899 Other long term (current) drug therapy: Secondary | ICD-10-CM | POA: Diagnosis not present

## 2023-06-07 DIAGNOSIS — E1122 Type 2 diabetes mellitus with diabetic chronic kidney disease: Secondary | ICD-10-CM | POA: Diagnosis not present

## 2023-06-07 DIAGNOSIS — I130 Hypertensive heart and chronic kidney disease with heart failure and stage 1 through stage 4 chronic kidney disease, or unspecified chronic kidney disease: Secondary | ICD-10-CM | POA: Diagnosis not present

## 2023-06-07 DIAGNOSIS — M5430 Sciatica, unspecified side: Secondary | ICD-10-CM | POA: Diagnosis not present

## 2023-06-07 DIAGNOSIS — I2510 Atherosclerotic heart disease of native coronary artery without angina pectoris: Secondary | ICD-10-CM | POA: Diagnosis not present

## 2023-06-07 DIAGNOSIS — E785 Hyperlipidemia, unspecified: Secondary | ICD-10-CM | POA: Diagnosis not present

## 2023-06-07 DIAGNOSIS — G3184 Mild cognitive impairment, so stated: Secondary | ICD-10-CM | POA: Diagnosis not present

## 2023-06-07 DIAGNOSIS — M519 Unspecified thoracic, thoracolumbar and lumbosacral intervertebral disc disorder: Secondary | ICD-10-CM | POA: Diagnosis not present

## 2023-06-07 DIAGNOSIS — N1831 Chronic kidney disease, stage 3a: Secondary | ICD-10-CM | POA: Diagnosis not present

## 2023-06-07 DIAGNOSIS — I509 Heart failure, unspecified: Secondary | ICD-10-CM | POA: Diagnosis not present

## 2023-06-07 DIAGNOSIS — K219 Gastro-esophageal reflux disease without esophagitis: Secondary | ICD-10-CM | POA: Diagnosis not present

## 2023-06-07 DIAGNOSIS — Z7901 Long term (current) use of anticoagulants: Secondary | ICD-10-CM | POA: Diagnosis not present

## 2023-06-07 DIAGNOSIS — F32A Depression, unspecified: Secondary | ICD-10-CM | POA: Diagnosis not present

## 2023-06-07 DIAGNOSIS — Z7982 Long term (current) use of aspirin: Secondary | ICD-10-CM | POA: Diagnosis not present

## 2023-06-11 DIAGNOSIS — I509 Heart failure, unspecified: Secondary | ICD-10-CM | POA: Diagnosis not present

## 2023-06-11 DIAGNOSIS — M5430 Sciatica, unspecified side: Secondary | ICD-10-CM | POA: Diagnosis not present

## 2023-06-11 DIAGNOSIS — N1831 Chronic kidney disease, stage 3a: Secondary | ICD-10-CM | POA: Diagnosis not present

## 2023-06-11 DIAGNOSIS — I2510 Atherosclerotic heart disease of native coronary artery without angina pectoris: Secondary | ICD-10-CM | POA: Diagnosis not present

## 2023-06-11 DIAGNOSIS — E785 Hyperlipidemia, unspecified: Secondary | ICD-10-CM | POA: Diagnosis not present

## 2023-06-11 DIAGNOSIS — Z79899 Other long term (current) drug therapy: Secondary | ICD-10-CM | POA: Diagnosis not present

## 2023-06-11 DIAGNOSIS — F32A Depression, unspecified: Secondary | ICD-10-CM | POA: Diagnosis not present

## 2023-06-11 DIAGNOSIS — E1122 Type 2 diabetes mellitus with diabetic chronic kidney disease: Secondary | ICD-10-CM | POA: Diagnosis not present

## 2023-06-11 DIAGNOSIS — G3184 Mild cognitive impairment, so stated: Secondary | ICD-10-CM | POA: Diagnosis not present

## 2023-06-11 DIAGNOSIS — Z7982 Long term (current) use of aspirin: Secondary | ICD-10-CM | POA: Diagnosis not present

## 2023-06-11 DIAGNOSIS — K219 Gastro-esophageal reflux disease without esophagitis: Secondary | ICD-10-CM | POA: Diagnosis not present

## 2023-06-11 DIAGNOSIS — I69392 Facial weakness following cerebral infarction: Secondary | ICD-10-CM | POA: Diagnosis not present

## 2023-06-11 DIAGNOSIS — M519 Unspecified thoracic, thoracolumbar and lumbosacral intervertebral disc disorder: Secondary | ICD-10-CM | POA: Diagnosis not present

## 2023-06-11 DIAGNOSIS — I130 Hypertensive heart and chronic kidney disease with heart failure and stage 1 through stage 4 chronic kidney disease, or unspecified chronic kidney disease: Secondary | ICD-10-CM | POA: Diagnosis not present

## 2023-06-11 DIAGNOSIS — I4891 Unspecified atrial fibrillation: Secondary | ICD-10-CM | POA: Diagnosis not present

## 2023-06-11 DIAGNOSIS — Z7901 Long term (current) use of anticoagulants: Secondary | ICD-10-CM | POA: Diagnosis not present

## 2023-06-13 DIAGNOSIS — N1831 Chronic kidney disease, stage 3a: Secondary | ICD-10-CM | POA: Diagnosis not present

## 2023-06-13 DIAGNOSIS — I509 Heart failure, unspecified: Secondary | ICD-10-CM | POA: Diagnosis not present

## 2023-06-13 DIAGNOSIS — D631 Anemia in chronic kidney disease: Secondary | ICD-10-CM | POA: Diagnosis not present

## 2023-06-13 DIAGNOSIS — I129 Hypertensive chronic kidney disease with stage 1 through stage 4 chronic kidney disease, or unspecified chronic kidney disease: Secondary | ICD-10-CM | POA: Diagnosis not present

## 2023-06-13 DIAGNOSIS — N179 Acute kidney failure, unspecified: Secondary | ICD-10-CM | POA: Diagnosis not present

## 2023-06-14 DIAGNOSIS — F32A Depression, unspecified: Secondary | ICD-10-CM | POA: Diagnosis not present

## 2023-06-14 DIAGNOSIS — I130 Hypertensive heart and chronic kidney disease with heart failure and stage 1 through stage 4 chronic kidney disease, or unspecified chronic kidney disease: Secondary | ICD-10-CM | POA: Diagnosis not present

## 2023-06-14 DIAGNOSIS — K219 Gastro-esophageal reflux disease without esophagitis: Secondary | ICD-10-CM | POA: Diagnosis not present

## 2023-06-14 DIAGNOSIS — Z79899 Other long term (current) drug therapy: Secondary | ICD-10-CM | POA: Diagnosis not present

## 2023-06-14 DIAGNOSIS — M519 Unspecified thoracic, thoracolumbar and lumbosacral intervertebral disc disorder: Secondary | ICD-10-CM | POA: Diagnosis not present

## 2023-06-14 DIAGNOSIS — E1122 Type 2 diabetes mellitus with diabetic chronic kidney disease: Secondary | ICD-10-CM | POA: Diagnosis not present

## 2023-06-14 DIAGNOSIS — I509 Heart failure, unspecified: Secondary | ICD-10-CM | POA: Diagnosis not present

## 2023-06-14 DIAGNOSIS — Z7901 Long term (current) use of anticoagulants: Secondary | ICD-10-CM | POA: Diagnosis not present

## 2023-06-14 DIAGNOSIS — M5430 Sciatica, unspecified side: Secondary | ICD-10-CM | POA: Diagnosis not present

## 2023-06-14 DIAGNOSIS — Z7982 Long term (current) use of aspirin: Secondary | ICD-10-CM | POA: Diagnosis not present

## 2023-06-14 DIAGNOSIS — G3184 Mild cognitive impairment, so stated: Secondary | ICD-10-CM | POA: Diagnosis not present

## 2023-06-14 DIAGNOSIS — N1831 Chronic kidney disease, stage 3a: Secondary | ICD-10-CM | POA: Diagnosis not present

## 2023-06-14 DIAGNOSIS — E785 Hyperlipidemia, unspecified: Secondary | ICD-10-CM | POA: Diagnosis not present

## 2023-06-14 DIAGNOSIS — I2510 Atherosclerotic heart disease of native coronary artery without angina pectoris: Secondary | ICD-10-CM | POA: Diagnosis not present

## 2023-06-14 DIAGNOSIS — I4891 Unspecified atrial fibrillation: Secondary | ICD-10-CM | POA: Diagnosis not present

## 2023-06-14 DIAGNOSIS — I69392 Facial weakness following cerebral infarction: Secondary | ICD-10-CM | POA: Diagnosis not present

## 2023-07-13 DIAGNOSIS — D631 Anemia in chronic kidney disease: Secondary | ICD-10-CM | POA: Diagnosis not present

## 2023-07-13 DIAGNOSIS — N1831 Chronic kidney disease, stage 3a: Secondary | ICD-10-CM | POA: Diagnosis not present

## 2023-07-13 DIAGNOSIS — N179 Acute kidney failure, unspecified: Secondary | ICD-10-CM | POA: Diagnosis not present

## 2023-07-13 DIAGNOSIS — I129 Hypertensive chronic kidney disease with stage 1 through stage 4 chronic kidney disease, or unspecified chronic kidney disease: Secondary | ICD-10-CM | POA: Diagnosis not present

## 2023-07-18 ENCOUNTER — Encounter: Payer: Self-pay | Admitting: Vascular Surgery

## 2023-07-18 DIAGNOSIS — N1831 Chronic kidney disease, stage 3a: Secondary | ICD-10-CM | POA: Diagnosis not present

## 2023-07-18 DIAGNOSIS — N179 Acute kidney failure, unspecified: Secondary | ICD-10-CM | POA: Diagnosis not present

## 2023-07-18 DIAGNOSIS — D631 Anemia in chronic kidney disease: Secondary | ICD-10-CM | POA: Diagnosis not present

## 2023-07-18 DIAGNOSIS — N2581 Secondary hyperparathyroidism of renal origin: Secondary | ICD-10-CM | POA: Diagnosis not present

## 2023-07-18 DIAGNOSIS — I129 Hypertensive chronic kidney disease with stage 1 through stage 4 chronic kidney disease, or unspecified chronic kidney disease: Secondary | ICD-10-CM | POA: Diagnosis not present

## 2023-07-18 DIAGNOSIS — N184 Chronic kidney disease, stage 4 (severe): Secondary | ICD-10-CM | POA: Diagnosis not present

## 2023-07-18 DIAGNOSIS — I509 Heart failure, unspecified: Secondary | ICD-10-CM | POA: Diagnosis not present

## 2023-07-23 DIAGNOSIS — R42 Dizziness and giddiness: Secondary | ICD-10-CM | POA: Diagnosis not present

## 2023-07-23 DIAGNOSIS — G3184 Mild cognitive impairment, so stated: Secondary | ICD-10-CM | POA: Diagnosis not present

## 2023-07-23 DIAGNOSIS — R2689 Other abnormalities of gait and mobility: Secondary | ICD-10-CM | POA: Diagnosis not present

## 2023-07-23 DIAGNOSIS — E538 Deficiency of other specified B group vitamins: Secondary | ICD-10-CM | POA: Diagnosis not present

## 2023-07-25 DIAGNOSIS — I2585 Chronic coronary microvascular dysfunction: Secondary | ICD-10-CM | POA: Diagnosis not present

## 2023-07-25 DIAGNOSIS — I5032 Chronic diastolic (congestive) heart failure: Secondary | ICD-10-CM | POA: Diagnosis not present

## 2023-07-25 DIAGNOSIS — N181 Chronic kidney disease, stage 1: Secondary | ICD-10-CM | POA: Diagnosis not present

## 2023-07-25 DIAGNOSIS — I6529 Occlusion and stenosis of unspecified carotid artery: Secondary | ICD-10-CM | POA: Diagnosis not present

## 2023-07-25 DIAGNOSIS — I1A0 Resistant hypertension: Secondary | ICD-10-CM | POA: Diagnosis not present

## 2023-07-25 DIAGNOSIS — I4819 Other persistent atrial fibrillation: Secondary | ICD-10-CM | POA: Diagnosis not present

## 2023-07-27 DIAGNOSIS — H3581 Retinal edema: Secondary | ICD-10-CM | POA: Diagnosis not present

## 2023-07-27 DIAGNOSIS — H35033 Hypertensive retinopathy, bilateral: Secondary | ICD-10-CM | POA: Diagnosis not present

## 2023-07-27 DIAGNOSIS — H3530 Unspecified macular degeneration: Secondary | ICD-10-CM | POA: Diagnosis not present

## 2023-08-06 ENCOUNTER — Encounter: Payer: Self-pay | Admitting: Family Medicine

## 2023-08-06 ENCOUNTER — Ambulatory Visit (INDEPENDENT_AMBULATORY_CARE_PROVIDER_SITE_OTHER): Payer: Medicare Other | Admitting: Family Medicine

## 2023-08-06 VITALS — BP 124/62 | HR 71 | Ht 67.0 in | Wt 164.2 lb

## 2023-08-06 DIAGNOSIS — Z23 Encounter for immunization: Secondary | ICD-10-CM

## 2023-08-06 DIAGNOSIS — F339 Major depressive disorder, recurrent, unspecified: Secondary | ICD-10-CM

## 2023-08-06 MED ORDER — VENLAFAXINE HCL ER 75 MG PO CP24
75.0000 mg | ORAL_CAPSULE | Freq: Every day | ORAL | 1 refills | Status: DC
Start: 2023-08-06 — End: 2024-01-22

## 2023-08-06 NOTE — Progress Notes (Signed)
Date:  08/06/2023   Name:  Karina Leonard   DOB:  12-May-1941   MRN:  [id number]   Chief Complaint: Follow-up and Hypertension  Depression        This is a chronic problem.  The current episode started more than 1 year ago.   The onset quality is gradual.   The problem occurs intermittently.  The problem has been gradually improving since onset.  Associated symptoms include fatigue, restlessness, decreased interest and sad.  Associated symptoms include no decreased concentration, no helplessness, no hopelessness, does not have insomnia, not irritable, no appetite change, no body aches, no myalgias, no headaches, no indigestion and no suicidal ideas.  Past treatments include SNRIs - Serotonin and norepinephrine reuptake inhibitors.   Lab Results  Component Value Date   NA 137 03/22/2023   K 4.0 03/22/2023   CO2 21 (L) 03/22/2023   GLUCOSE 106 (H) 03/22/2023   BUN 60 (H) 03/22/2023   CREATININE 2.19 (H) 03/22/2023   CALCIUM 8.2 (L) 03/22/2023   EGFR 43 (L) 02/02/2023   GFRNONAA 22 (L) 03/22/2023   Lab Results  Component Value Date   CHOL 113 03/13/2023   HDL 39 (L) 03/13/2023   LDLCALC 59 03/13/2023   TRIG 74 03/13/2023   CHOLHDL 2.9 03/13/2023   Lab Results  Component Value Date   TSH 3.217 03/08/2023   Lab Results  Component Value Date   HGBA1C 5.7 (H) 03/08/2023   Lab Results  Component Value Date   WBC 13.8 (H) 03/22/2023   HGB 9.5 (L) 03/22/2023   HCT 30.1 (L) 03/22/2023   MCV 94.7 03/22/2023   PLT 295 03/22/2023   Lab Results  Component Value Date   ALT 37 03/09/2023   AST 82 (H) 03/09/2023   ALKPHOS 84 03/09/2023   BILITOT 0.9 03/09/2023   No results found for: "25OHVITD2", "25OHVITD3", "VD25OH"   Review of Systems  Constitutional:  Positive for fatigue. Negative for appetite change.  HENT:  Negative for trouble swallowing.   Respiratory:  Negative for chest tightness, shortness of breath and wheezing.   Cardiovascular:  Negative for chest pain,  palpitations and leg swelling.  Gastrointestinal:  Negative for abdominal pain, blood in stool, diarrhea and nausea.  Endocrine: Negative for polydipsia and polyuria.  Musculoskeletal:  Negative for myalgias.  Neurological:  Negative for headaches.  Psychiatric/Behavioral:  Positive for depression. Negative for decreased concentration and suicidal ideas. The patient does not have insomnia.     Patient Active Problem List   Diagnosis Date Noted   Acute CVA (cerebrovascular accident) (HCC) 03/14/2023   AKI (acute kidney injury) (HCC) 03/14/2023   Malignant hypertension 03/08/2023   Carotid stenosis 02/09/2023   Diabetes mellitus without complication (HCC) 02/09/2023   Urinary incontinence 07/19/2021   Need for vaccination 03/18/2019   H/O hypercholesterolemia 03/18/2019   Drug-induced obesity 03/18/2019   Coronary artery disease 03/18/2019   Cataract cortical, senile 03/18/2019   Taking medication for chronic disease 03/18/2019   Numbness and tingling of right upper extremity 08/15/2016   Lipoma of right upper extremity 08/15/2016   Cerebral microvascular disease 07/23/2015   Familial multiple lipoprotein-type hyperlipidemia 02/16/2015   Anxiety attack 02/16/2015   Recurrent major depressive episodes (HCC) 02/16/2015   Essential (primary) hypertension 02/16/2015   H/O: HTN (hypertension) 02/16/2015   Asymptomatic hypertensive urgency 11/14/2014   Tubulovillous adenoma of rectum 10/21/2014   Depression 10/21/2014   Nuclear sclerosis of both eyes 07/07/2014    Allergies  Allergen Reactions  Amoxicillin Hives   Donepezil Diarrhea   Nicardipine Hcl In Dextrose Other (See Comments)    Patient vasovagal response on low dose cardene   Penicillins Hives    Past Surgical History:  Procedure Laterality Date   CHOLECYSTECTOMY  2009   COLONOSCOPY     RECTAL POLYPECTOMY     benign    Social History   Tobacco Use   Smoking status: Former    Current packs/day: 0.00     Average packs/day: 1 pack/day for 20.0 years (20.0 ttl pk-yrs)    Types: Cigarettes    Start date: 54    Quit date: 85    Years since quitting: 57.8   Smokeless tobacco: Never  Vaping Use   Vaping status: Never Used  Substance Use Topics   Alcohol use: Yes    Alcohol/week: 0.0 standard drinks of alcohol    Comment: special occasions   Drug use: No     Medication list has been reviewed and updated.  Current Meds  Medication Sig   acetaminophen (TYLENOL) 325 MG tablet Take 325 mg by mouth 2 (two) times daily.   aspirin EC 81 MG tablet Take 81 mg by mouth daily. Take while off of the plavix for surgey   cloNIDine (CATAPRES - DOSED IN MG/24 HR) 0.2 mg/24hr patch Place 0.2 mg onto the skin once a week. Sunday   fexofenadine (ALLEGRA) 180 MG tablet Take 1 tablet (180 mg total) by mouth daily. otc   loperamide (IMODIUM A-D) 2 MG tablet Take 2 mg by mouth as needed for diarrhea or loose stools.   lovastatin (MEVACOR) 20 MG tablet TAKE 2 TABLETS EVERY DAY (Patient taking differently: Take 20 mg by mouth daily with supper. TAKE 2 TABLETS EVERY DAY)   metoprolol succinate (TOPROL-XL) 50 MG 24 hr tablet Take 1 tablet (50 mg total) by mouth daily. [place]       08/06/2023    9:59 AM 02/02/2023   10:14 AM 08/02/2022   11:03 AM 01/31/2022   10:09 AM  GAD 7 : Generalized Anxiety Score  Nervous, Anxious, on Edge 0 0 0 0  Control/stop worrying 1 0 0 0  Worry too much - different things 0 0 0 0  Trouble relaxing 0 0 0 0  Restless 0 0 0 0  Easily annoyed or irritable 0 0 0 0  Afraid - awful might happen 1 0 0 0  Total GAD 7 Score 2 0 0 0  Anxiety Difficulty Not difficult at all Not difficult at all Not difficult at all Not difficult at all       10 /14/2024    9:59 AM 05/09/2023    1:41 PM 02/02/2023   10:14 AM  Depression screen PHQ 2/9  Decreased Interest 0 0 0  Down, Depressed, Hopeless 1 0 0  PHQ - 2 Score 1 0 0  Altered sleeping 1 0 0  Tired, decreased energy 1 0 0  Change  in appetite 0 0 0  Feeling bad or failure about yourself  0 0 0  Trouble concentrating 1 0 0  Moving slowly or fidgety/restless 1 0 0  Suicidal thoughts 0 0 0  PHQ-9 Score 5 0 0  Difficult doing work/chores Not difficult at all Not difficult at all Not difficult at all    BP Readings from Last 3 Encounters:  08/06/23 124/62  04/25/23 134/60  03/22/23 (!) 171/72    Physical Exam Vitals and nursing note reviewed. Exam conducted with a chaperone present.  Constitutional:      General: She is not irritable.She is not in acute distress.    Appearance: She is not diaphoretic.  HENT:     Head: Normocephalic and atraumatic.     Right Ear: External ear normal.     Left Ear: External ear normal.     Nose: Nose normal.  Eyes:     General:        Right eye: No discharge.        Left eye: No discharge.     Conjunctiva/sclera: Conjunctivae normal.     Pupils: Pupils are equal, round, and reactive to light.  Neck:     Thyroid: No thyromegaly.     Vascular: No JVD.  Cardiovascular:     Rate and Rhythm: Normal rate and regular rhythm.     Heart sounds: Normal heart sounds, S1 normal and S2 normal. No murmur heard.    No systolic murmur is present.     No diastolic murmur is present.     No friction rub. No gallop. No S3 or S4 sounds.  Pulmonary:     Effort: Pulmonary effort is normal.     Breath sounds: Normal breath sounds. No wheezing, rhonchi or rales.  Abdominal:     General: Bowel sounds are normal.     Palpations: Abdomen is soft. There is no mass.     Tenderness: There is no abdominal tenderness. There is no guarding.  Musculoskeletal:        General: Normal range of motion.     Cervical back: Normal range of motion and neck supple.  Lymphadenopathy:     Cervical: No cervical adenopathy.  Skin:    General: Skin is warm and dry.  Neurological:     Mental Status: She is alert.     Wt Readings from Last 3 Encounters:  08/06/23 164 lb 3.2 oz (74.5 kg)  05/09/23 174 lb  (78.9 kg)  04/25/23 174 lb 9.6 oz (79.2 kg)    BP 124/62 (BP Location: Left Arm, Patient Position: Sitting, Cuff Size: Normal)   Pulse 71   Ht 5\' 7"  (1.702 m)   Wt 164 lb 3.2 oz (74.5 kg)   SpO2 95%   BMI 25.72 kg/m   Assessment and Plan: 1. Recurrent major depressive episodes (HCC) Chronic.  Controlled.  Stabl  PHQ is 5 and GAD score is 2.  5 and GAD score is 2.  Continue current dosing of venlafaxine Exar 75 mg once a day.  Will recheck patient in 6 months. - venlafaxine XR (EFFEXOR-XR) 75 MG 24 hr capsule; Take 1 capsule (75 mg total) by mouth daily.  Dispense: 90 capsule; Refill: 1  2. Encounter for immunization Discussed and administered - Flu Vaccine Trivalent High Dose (Fluad)     Elizabeth Sauer, MD

## 2023-08-10 DIAGNOSIS — N2581 Secondary hyperparathyroidism of renal origin: Secondary | ICD-10-CM | POA: Diagnosis not present

## 2023-08-10 DIAGNOSIS — N184 Chronic kidney disease, stage 4 (severe): Secondary | ICD-10-CM | POA: Diagnosis not present

## 2023-08-10 DIAGNOSIS — D631 Anemia in chronic kidney disease: Secondary | ICD-10-CM | POA: Diagnosis not present

## 2023-08-10 DIAGNOSIS — I129 Hypertensive chronic kidney disease with stage 1 through stage 4 chronic kidney disease, or unspecified chronic kidney disease: Secondary | ICD-10-CM | POA: Diagnosis not present

## 2023-08-15 DIAGNOSIS — D631 Anemia in chronic kidney disease: Secondary | ICD-10-CM | POA: Diagnosis not present

## 2023-08-15 DIAGNOSIS — I129 Hypertensive chronic kidney disease with stage 1 through stage 4 chronic kidney disease, or unspecified chronic kidney disease: Secondary | ICD-10-CM | POA: Diagnosis not present

## 2023-08-15 DIAGNOSIS — I509 Heart failure, unspecified: Secondary | ICD-10-CM | POA: Diagnosis not present

## 2023-08-15 DIAGNOSIS — N184 Chronic kidney disease, stage 4 (severe): Secondary | ICD-10-CM | POA: Diagnosis not present

## 2023-08-22 DIAGNOSIS — R278 Other lack of coordination: Secondary | ICD-10-CM | POA: Diagnosis not present

## 2023-08-27 DIAGNOSIS — H25813 Combined forms of age-related cataract, bilateral: Secondary | ICD-10-CM | POA: Diagnosis not present

## 2023-08-29 DIAGNOSIS — R278 Other lack of coordination: Secondary | ICD-10-CM | POA: Diagnosis not present

## 2023-08-31 DIAGNOSIS — R278 Other lack of coordination: Secondary | ICD-10-CM | POA: Diagnosis not present

## 2023-09-05 DIAGNOSIS — R278 Other lack of coordination: Secondary | ICD-10-CM | POA: Diagnosis not present

## 2023-09-07 DIAGNOSIS — R278 Other lack of coordination: Secondary | ICD-10-CM | POA: Diagnosis not present

## 2023-09-12 DIAGNOSIS — R278 Other lack of coordination: Secondary | ICD-10-CM | POA: Diagnosis not present

## 2023-09-14 DIAGNOSIS — R278 Other lack of coordination: Secondary | ICD-10-CM | POA: Diagnosis not present

## 2023-09-17 DIAGNOSIS — R278 Other lack of coordination: Secondary | ICD-10-CM | POA: Diagnosis not present

## 2023-09-26 DIAGNOSIS — R278 Other lack of coordination: Secondary | ICD-10-CM | POA: Diagnosis not present

## 2023-09-28 DIAGNOSIS — R278 Other lack of coordination: Secondary | ICD-10-CM | POA: Diagnosis not present

## 2023-10-01 DIAGNOSIS — R278 Other lack of coordination: Secondary | ICD-10-CM | POA: Diagnosis not present

## 2023-10-03 DIAGNOSIS — R278 Other lack of coordination: Secondary | ICD-10-CM | POA: Diagnosis not present

## 2023-10-10 DIAGNOSIS — R278 Other lack of coordination: Secondary | ICD-10-CM | POA: Diagnosis not present

## 2023-10-12 DIAGNOSIS — R278 Other lack of coordination: Secondary | ICD-10-CM | POA: Diagnosis not present

## 2023-10-29 DIAGNOSIS — N184 Chronic kidney disease, stage 4 (severe): Secondary | ICD-10-CM | POA: Diagnosis not present

## 2023-10-29 DIAGNOSIS — R0609 Other forms of dyspnea: Secondary | ICD-10-CM | POA: Diagnosis not present

## 2023-10-29 DIAGNOSIS — I480 Paroxysmal atrial fibrillation: Secondary | ICD-10-CM | POA: Diagnosis not present

## 2023-10-29 DIAGNOSIS — I509 Heart failure, unspecified: Secondary | ICD-10-CM | POA: Diagnosis not present

## 2023-10-29 DIAGNOSIS — I1A0 Resistant hypertension: Secondary | ICD-10-CM | POA: Diagnosis not present

## 2023-10-30 ENCOUNTER — Ambulatory Visit (INDEPENDENT_AMBULATORY_CARE_PROVIDER_SITE_OTHER): Payer: Medicare Other

## 2023-10-30 ENCOUNTER — Encounter: Payer: Self-pay | Admitting: Emergency Medicine

## 2023-10-30 ENCOUNTER — Ambulatory Visit
Admission: EM | Admit: 2023-10-30 | Discharge: 2023-10-30 | Disposition: A | Discharge status: Home / Self Care | Payer: Medicare Other | Attending: Emergency Medicine | Admitting: Emergency Medicine

## 2023-10-30 DIAGNOSIS — K5900 Constipation, unspecified: Secondary | ICD-10-CM

## 2023-10-30 DIAGNOSIS — K648 Other hemorrhoids: Secondary | ICD-10-CM

## 2023-10-30 DIAGNOSIS — R1032 Left lower quadrant pain: Secondary | ICD-10-CM | POA: Diagnosis not present

## 2023-10-30 MED ORDER — HYDROCORTISONE ACETATE 25 MG RE SUPP: 25.0000 mg | Freq: Two times a day (BID) | RECTAL | 0 refills | Status: DC

## 2023-10-30 NOTE — ED Triage Notes (Signed)
 Patient c/o constipation x 2 days. Patient does take daily imodium. Patient also took colace on Sunday. Patient also states she has blood when wiping after a bowel movement. Patient had a cholecystectomy about 8 years ago.

## 2023-10-30 NOTE — ED Provider Notes (Addendum)
 [facility]    CSN: [id number] Arrival date & time: 10/30/23  1319      History   Chief Complaint Chief Complaint  Patient presents with   Constipation   Hemorrhoids    HPI Karina Leonard is a 83 y.o. female.   HPI  84 year old female with a past medical history significant for CAD, CVA, bilateral carotid artery disease status post endarterectomy, atrial fibrillation, aortic atherosclerosis, hypertension, hyperlipidemia, diastolic dysfunction with EF greater than 55%, mild cognitive impairment, and long-term use of anticoagulant therapy presents for evaluation of constipation.  The patient takes Imodium  daily to control loose bowels stemming from a previous cholecystectomy.  She reports that she has been experiencing constipation, though did have a formed bowel movement yesterday.  She is also complaining of blood when she wipes and pain from an internal hemorrhoid.  She denies any abdominal pain, fever, nausea, vomiting, or rectal itching.  Past Medical History:  Diagnosis Date   Allergy    Aortic atherosclerosis (HCC)    Atrial fibrillation with slow ventricular response (HCC) 02/27/2023   a.) noted on preoperative ECG 02/27/2023; A.fib at 52 bpm with LVH and (+) inferolateral TWIs; b.) CHA2DS2VASc = 7 (age x 2, sex, HTN, CVA/TIA x2, vascular disease history); c.) rate/rhythm maintained on oral metoprolol ; chronically antithrombotic therapy (ASA + clopidogrel )   Bilateral carotid artery disease (HCC)    a.) doppler 01/25/2023: 60% RICA, 70% LICA   Bradycardia    Cataract cortical, senile    Cerebrovascular disease    Coronary artery disease involving native coronary artery of native heart without angina pectoris    a.) MPI 05/24/2020: EF 45-50%. Borderline ant/lat defect with border zone redistribution; b.) MPI 01/08/2023: EF 45%; mild-mod mixed ant apical defect c/w infarct vs. ischemia   DDD (degenerative disc disease), lumbar    Depression    Diastolic  dysfunction    a.) TTE 01/08/2023: EF >55%, mod LVH, sev LAE, mild MR/TR/PR, G1DD   Full dentures    Hyperlipidemia    Hypertension    Keloid    Lipoma    Long term current use of antithrombotics/antiplatelets    a.) on DAPT (ASA + clopidogrel )   Mild cognitive impairment    Pre-diabetes    Sciatica    Stroke [facility])    a.) MRI brain 03/19/2020: multiple old small vessel infarcts of the basal ganglia, thalamus and pons   TIA (transient ischemic attack)    Urinary incontinence     Patient Active Problem List   Diagnosis Date Noted   Acute CVA (cerebrovascular accident) (HCC) 03/14/2023   AKI (acute kidney injury) (HCC) 03/14/2023   Malignant hypertension 03/08/2023   Carotid stenosis 02/09/2023   Diabetes mellitus without complication (HCC) 02/09/2023   Urinary incontinence 07/19/2021   Need for vaccination 03/18/2019   H/O hypercholesterolemia 03/18/2019   Drug-induced obesity 03/18/2019   Coronary artery disease 03/18/2019   Cataract cortical, senile 03/18/2019   Taking medication for chronic disease 03/18/2019   Numbness and tingling of right upper extremity 08/15/2016   Lipoma of right upper extremity 08/15/2016   Cerebral microvascular disease 07/23/2015   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      Home Medications    Prior to Admission medications   Medication Sig Start Date End Date Taking? Authorizing Provider  hydrocortisone  (ANUSOL -HC)  25 MG suppository Place 1 suppository (25 mg total) rectally 2 (two) times daily. 10/30/23  Yes Bernardino Ditch, NP  acetaminophen  (TYLENOL ) 325 MG tablet Take 325 mg by mouth 2 (two) times daily.    [provider]  amLODipine  (NORVASC ) 10 MG tablet Take 1 tablet (10 mg total) by mouth daily. 03/23/23 04/25/23  Trudy Anthony HERO, MD  apixaban  (ELIQUIS ) 2.5 MG TABS tablet Take 1 tablet (2.5 mg total) by mouth 2 (two) times daily. 03/22/23 04/25/23  Trudy Anthony HERO, MD  aspirin  EC 81 MG tablet Take 81 mg by mouth daily. Take while off of the plavix  for surgey    [provider]  cloNIDine  (CATAPRES  - DOSED IN MG/24 HR) 0.2 mg/24hr patch Place 0.2 mg onto the skin once a week. Sunday    [provider]  doxazosin (CARDURA) 2 MG tablet Take 2 mg by mouth as needed. 2 mg by mouth as needed for systolic blood pressure above 824 Patient not taking: Reported on 05/09/2023    [provider]  fexofenadine  (ALLEGRA ) 180 MG tablet Take 1 tablet (180 mg total) by mouth daily. otc 07/22/18   Joshua Cathryne BROCKS, MD  fluticasone  (FLONASE ) 50 MCG/ACT nasal spray Place 1 spray into both nostrils daily. otc Patient not taking: Reported on 08/06/2023    [provider]  furosemide  (LASIX ) 20 MG tablet Take 1 tablet (20 mg total) by mouth daily. Hold this medication until you see cardiology and/or nephrology Patient not taking: Reported on 05/09/2023 03/22/23   Trudy Anthony HERO, MD  hydrALAZINE  (APRESOLINE ) 50 MG tablet Take 1 tablet (50 mg total) by mouth every 8 (eight) hours. 03/22/23 04/25/23  Trudy Anthony HERO, MD  loperamide  (IMODIUM  A-D) 2 MG tablet Take 2 mg by mouth as needed for diarrhea or loose stools.    [provider]  losartan  (COZAAR ) 50 MG tablet Take 1 tablet (50 mg total) by mouth daily. Patient taking differently: Take 100 mg by mouth daily. 03/23/23 04/25/23  Trudy Anthony HERO, MD  lovastatin  (MEVACOR ) 20 MG tablet TAKE 2 TABLETS EVERY DAY Patient  taking differently: Take 20 mg by mouth daily with supper. TAKE 2 TABLETS EVERY DAY 02/02/23   Jones, Deanna C, MD  melatonin 5 MG TABS Take 5 mg by mouth at bedtime as needed. Patient not taking: Reported on 05/09/2023    [provider]  metoprolol  succinate (TOPROL -XL) 50 MG 24 hr tablet Take 1 tablet (50 mg total) by mouth daily. [place] 05/25/22   Joshua Cathryne BROCKS, MD  omeprazole (PRILOSEC) 20 MG capsule Take 20 mg by mouth every morning. Patient not taking: Reported on 05/09/2023 04/06/23   [provider]  pantoprazole  (PROTONIX ) 40 MG tablet Take 1 tablet (40 mg total) by mouth daily. 03/23/23 04/22/23  Trudy Anthony HERO, MD  spironolactone (ALDACTONE) 25 MG tablet Take 12.5 mg by mouth daily. For 30 days 04/10/23 05/11/23  [provider]  torsemide (DEMADEX) 20 MG tablet Take 40 mg by mouth daily. For 30 days 04/10/23 05/10/23  [provider]  venlafaxine  XR (EFFEXOR -XR) 75 MG 24 hr capsule Take 1 capsule (75 mg total)  by mouth daily. 08/06/23   Joshua Cathryne BROCKS, MD    Family History Family History  Problem Relation Age of Onset   Cancer Mother        uterine or ovarian. Unable to remember   Cancer Father        lung   Heart attack Sister     Social History Social History   Tobacco Use   Smoking status: Former    Current packs/day: 0.00    Average packs/day: 1 pack/day for 20.0 years (20.0 ttl pk-yrs)    Types: Cigarettes    Start date: 40    Quit date: 50    Years since quitting: 58.0   Smokeless tobacco: Never  Vaping Use   Vaping status: Never Used  Substance Use Topics   Alcohol use: Yes    Alcohol/week: 0.0 standard drinks of alcohol    Comment: special occasions   Drug use: No     Allergies   Amoxicillin, Empagliflozin, Donepezil, Nicardipine  hcl in dextrose , and Penicillins   Review of Systems Review of Systems  Constitutional:  Negative for fever.  Gastrointestinal:  Positive for anal bleeding, constipation and rectal  pain. Negative for abdominal pain, blood in stool, diarrhea, nausea and vomiting.       Blood with wiping.     Physical Exam Triage Vital Signs ED Triage Vitals  Encounter Vitals Group     BP      Systolic BP Percentile      Diastolic BP Percentile      Pulse      Resp      Temp      Temp src      SpO2      Weight      Height      Head Circumference      Peak Flow      Pain Score      Pain Loc      Pain Education      Exclude from Growth Chart    No data found.  Updated Vital Signs BP (!) 154/54   Pulse 72   Temp 98.3 F (36.8 C) (Oral)   Resp 18   SpO2 98%   Visual Acuity Right Eye Distance:   Left Eye Distance:   Bilateral Distance:    Right Eye Near:   Left Eye Near:    Bilateral Near:     Physical Exam Vitals and nursing note reviewed.  Constitutional:      Appearance: Normal appearance. She is not ill-appearing.  HENT:     Head: Normocephalic and atraumatic.  Cardiovascular:     Rate and Rhythm: Normal rate and regular rhythm.     Pulses: Normal pulses.     Heart sounds: Normal heart sounds. No murmur heard.    No friction rub. No gallop.  Pulmonary:     Effort: Pulmonary effort is normal.     Breath sounds: Normal breath sounds. No wheezing, rhonchi or rales.  Abdominal:     Palpations: Abdomen is soft.     Tenderness: There is abdominal tenderness. There is no guarding or rebound.     Comments: Mild left lower quadrant abdominal tenderness.  No guarding.  Genitourinary:    Rectum: Normal.  Skin:    General: Skin is warm and dry.     Capillary Refill: Capillary refill takes less than 2 seconds.  Neurological:     General: No focal deficit present.     Mental Status: She  is alert and oriented to person, place, and time.      UC Treatments / Results  Labs (all labs ordered are listed, but only abnormal results are displayed) Labs Reviewed - No data to display  EKG   Radiology No results found.  Procedures Procedures (including  critical care time)  Medications Ordered in UC Medications - No data to display  Initial Impression / Assessment and Plan / UC Course  I have reviewed the triage vital signs and the nursing notes.  Pertinent labs & imaging results that were available during my care of the patient were reviewed by me and considered in my medical decision making (see chart for details).   Patient is a nontoxic-appearing 83 year old female presenting for evaluation of constipation, rectal pain, and bleeding when she wipes that started 2 days ago.  She takes Imodium  daily to prevent diarrhea associated with a previous cholecystectomy.  Her daughter is with her reports that she takes at least 1 daily.  This has been going on for over a year.  She has been experiencing constipation over the last couple days but does report that after taking a Colace she was able to have a bowel movement yesterday.  She is denying any abdominal pain at present though on exam she does have some mild left lower quadrant abdominal tenderness but no guarding.  She reports that she has been experiencing bleeding when she wipes and tenderness in her bottom as a result of an inflamed internal hemorrhoid.  With the daughter at the bedside I performed a visual inspection of the patient's rectal area and did not see any bleeding from the rectum or any external hemorrhoids.  The patient does have a history of a tubulovillous adenoma of the rectum and I cannot find any evidence of colonoscopy in epic.  I can see where 2 colonoscopies were attempted 1 on 03/04/2020 that appears to been canceled and again on 04/09/2020 that was canceled secondary to patient's elevated blood pressure.  Only other imaging study found was a CT of the abdomen pelvis from 2009 which revealed cholecystitis but no mention of bowel pathology.  I will obtain radiograph of patient's abdomen to evaluate for the presence of constipation.  KUB independently reviewed and evaluated by me.   Impression: Patient has moderate stool and her transverse and descending colon.  Nonobstructive bowel gas pattern.  Radiology overread is pending. Radiology impression states moderate colonic stool burden.  No obstruction.  I will discharge patient home with a diagnosis of constipation and an internal hemorrhoid.  I will prescribe hydrocortisone  suppositories that she can use twice daily to help calm down the inflamed hemorrhoid.  I will also recommend MiraLAX , 17 g in 8 ounces of a beverage of her choice daily to help resolve the stool burden.   Final Clinical Impressions(s) / UC Diagnoses   Final diagnoses:  Constipation, unspecified constipation type  Internal hemorrhoid     Discharge Instructions      Your x-ray of your abdomen showed that you have a moderate amount of stool in your colon but did not demonstrate significant constipation.  I recommend you use over-the-counter MiraLAX , 1 capful in 8 ounces of a beverage of your choice, daily to help achieve normal bowel movements.  For your inflamed internal hemorrhoid I am prescribing you hydrocortisone  suppositories.  You may insert 1 suppository twice daily rectally to help decrease inflammation and aid in pain relief.  If you develop any fever, abdominal pain, or rectal bleeding I  recommend you go to the ER for evaluation.       ED Prescriptions     Medication Sig Dispense Auth. Provider   hydrocortisone  (ANUSOL -HC) 25 MG suppository Place 1 suppository (25 mg total) rectally 2 (two) times daily. 12 suppository Bernardino Ditch, NP      PDMP not reviewed this encounter.   Bernardino Ditch, NP 10/30/23 1500    Bernardino Ditch, NP 10/30/23 1531

## 2023-10-30 NOTE — Discharge Instructions (Addendum)
 Your x-ray of your abdomen showed that you have a moderate amount of stool in your colon but did not demonstrate significant constipation.  I recommend you use over-the-counter MiraLAX , 1 capful in 8 ounces of a beverage of your choice, daily to help achieve normal bowel movements.  For your inflamed internal hemorrhoid I am prescribing you hydrocortisone  suppositories.  You may insert 1 suppository twice daily rectally to help decrease inflammation and aid in pain relief.  If you develop any fever, abdominal pain, or rectal bleeding I recommend you go to the ER for evaluation.

## 2023-11-14 DIAGNOSIS — R278 Other lack of coordination: Secondary | ICD-10-CM | POA: Diagnosis not present

## 2023-11-16 DIAGNOSIS — R278 Other lack of coordination: Secondary | ICD-10-CM | POA: Diagnosis not present

## 2023-11-16 DIAGNOSIS — I509 Heart failure, unspecified: Secondary | ICD-10-CM | POA: Diagnosis not present

## 2023-11-16 DIAGNOSIS — N184 Chronic kidney disease, stage 4 (severe): Secondary | ICD-10-CM | POA: Diagnosis not present

## 2023-11-16 DIAGNOSIS — D631 Anemia in chronic kidney disease: Secondary | ICD-10-CM | POA: Diagnosis not present

## 2023-11-16 DIAGNOSIS — I129 Hypertensive chronic kidney disease with stage 1 through stage 4 chronic kidney disease, or unspecified chronic kidney disease: Secondary | ICD-10-CM | POA: Diagnosis not present

## 2023-11-19 DIAGNOSIS — N184 Chronic kidney disease, stage 4 (severe): Secondary | ICD-10-CM | POA: Diagnosis not present

## 2023-11-19 DIAGNOSIS — I1A0 Resistant hypertension: Secondary | ICD-10-CM | POA: Diagnosis not present

## 2023-11-21 DIAGNOSIS — I129 Hypertensive chronic kidney disease with stage 1 through stage 4 chronic kidney disease, or unspecified chronic kidney disease: Secondary | ICD-10-CM | POA: Diagnosis not present

## 2023-11-21 DIAGNOSIS — N184 Chronic kidney disease, stage 4 (severe): Secondary | ICD-10-CM | POA: Diagnosis not present

## 2023-11-21 DIAGNOSIS — N2581 Secondary hyperparathyroidism of renal origin: Secondary | ICD-10-CM | POA: Diagnosis not present

## 2023-11-21 DIAGNOSIS — D631 Anemia in chronic kidney disease: Secondary | ICD-10-CM | POA: Diagnosis not present

## 2023-11-23 DIAGNOSIS — R278 Other lack of coordination: Secondary | ICD-10-CM | POA: Diagnosis not present

## 2023-11-26 DIAGNOSIS — R278 Other lack of coordination: Secondary | ICD-10-CM | POA: Diagnosis not present

## 2023-11-28 DIAGNOSIS — R278 Other lack of coordination: Secondary | ICD-10-CM | POA: Diagnosis not present

## 2023-12-11 DIAGNOSIS — R278 Other lack of coordination: Secondary | ICD-10-CM | POA: Diagnosis not present

## 2023-12-12 DIAGNOSIS — I1A0 Resistant hypertension: Secondary | ICD-10-CM | POA: Diagnosis not present

## 2023-12-19 DIAGNOSIS — R278 Other lack of coordination: Secondary | ICD-10-CM | POA: Diagnosis not present

## 2024-01-22 ENCOUNTER — Ambulatory Visit (INDEPENDENT_AMBULATORY_CARE_PROVIDER_SITE_OTHER): Admitting: Family Medicine

## 2024-01-22 ENCOUNTER — Encounter: Payer: Self-pay | Admitting: Family Medicine

## 2024-01-22 VITALS — BP 128/70 | HR 79 | Ht 67.0 in | Wt 160.0 lb

## 2024-01-22 DIAGNOSIS — F339 Major depressive disorder, recurrent, unspecified: Secondary | ICD-10-CM | POA: Diagnosis not present

## 2024-01-22 DIAGNOSIS — R300 Dysuria: Secondary | ICD-10-CM

## 2024-01-22 MED ORDER — VENLAFAXINE HCL ER 75 MG PO CP24: 75.0000 mg | ORAL_CAPSULE | Freq: Every day | ORAL | 1 refills | Status: AC

## 2024-01-22 MED ORDER — SULFAMETHOXAZOLE-TRIMETHOPRIM 800-160 MG PO TABS: 1.0000 | ORAL_TABLET | Freq: Two times a day (BID) | ORAL | 0 refills | Status: AC

## 2024-01-22 NOTE — Progress Notes (Unsigned)
 Date:  01/22/2024   Name:  Karina Leonard   DOB:  [birth year]   MRN:  [id number]   Chief Complaint: Dysuria and Medical Management of Chronic Issues  Dysuria  This is a new problem. The current episode started in the past 7 days. The problem occurs every urination. The problem has been unchanged. The quality of the pain is described as burning. There has been no fever. There is No history of pyelonephritis. Associated symptoms include hesitancy and urgency. Pertinent negatives include no chills, discharge, flank pain, frequency or hematuria. She has tried increased fluids for the symptoms. The treatment provided no relief.  Depression        This is a chronic problem.  The current episode started more than 1 year ago.   The problem occurs intermittently.  The problem has been gradually improving since onset.  Associated symptoms include no decreased concentration, no fatigue, no helplessness, no hopelessness, does not have insomnia, not irritable, no restlessness, no decreased interest, no appetite change, no body aches, no myalgias, no headaches, no indigestion, not sad and no suicidal ideas.     The symptoms are aggravated by nothing.  Past treatments include SNRIs - Serotonin and norepinephrine reuptake inhibitors.  Compliance with treatment is variable.   Pertinent negatives include no hypothyroidism. Hyperlipidemia This is a chronic problem. The current episode started more than 1 year ago. The problem is controlled. Recent lipid tests were reviewed and are normal. She has no history of chronic renal disease, diabetes, hypothyroidism, liver disease, obesity or nephrotic syndrome. There are no known factors aggravating her hyperlipidemia. Pertinent negatives include no myalgias. Current antihyperlipidemic treatment includes statins. The current treatment provides moderate improvement of lipids. There are no compliance problems.  Risk factors for coronary artery disease include dyslipidemia.     Lab Results  Component Value Date   NA 137 03/22/2023   K 4.0 03/22/2023   CO2 21 (L) 03/22/2023   GLUCOSE 106 (H) 03/22/2023   BUN 60 (H) 03/22/2023   CREATININE 2.19 (H) 03/22/2023   CALCIUM 8.2 (L) 03/22/2023   EGFR 43 (L) 02/02/2023   GFRNONAA 22 (L) 03/22/2023   Lab Results  Component Value Date   CHOL 113 03/13/2023   HDL 39 (L) 03/13/2023   LDLCALC 59 03/13/2023   TRIG 74 03/13/2023   CHOLHDL 2.9 03/13/2023   Lab Results  Component Value Date   TSH 3.217 03/08/2023   Lab Results  Component Value Date   HGBA1C 5.7 (H) 03/08/2023   Lab Results  Component Value Date   WBC 13.8 (H) 03/22/2023   HGB 9.5 (L) 03/22/2023   HCT 30.1 (L) 03/22/2023   MCV 94.7 03/22/2023   PLT 295 03/22/2023   Lab Results  Component Value Date   ALT 37 03/09/2023   AST 82 (H) 03/09/2023   ALKPHOS 84 03/09/2023   BILITOT 0.9 03/09/2023   No results found for: "25OHVITD2", "25OHVITD3", "VD25OH"   Review of Systems  Constitutional:  Negative for appetite change, chills and fatigue.  Genitourinary:  Positive for dysuria, hesitancy and urgency. Negative for flank pain, frequency and hematuria.  Musculoskeletal:  Negative for myalgias.  Neurological:  Negative for headaches.  Psychiatric/Behavioral:  Positive for depression. Negative for decreased concentration and suicidal ideas. The patient does not have insomnia.     Patient Active Problem List   Diagnosis Date Noted   Acute CVA (cerebrovascular accident) (HCC) 03/14/2023   AKI (acute kidney injury) (HCC) 03/14/2023   Malignant hypertension  03/08/2023   Carotid stenosis 02/09/2023   Diabetes mellitus without complication (HCC) 02/09/2023   Urinary incontinence 07/19/2021   Need for vaccination 03/18/2019   H/O hypercholesterolemia 03/18/2019   Drug-induced obesity 03/18/2019   Coronary artery disease 03/18/2019   Cataract cortical, senile 03/18/2019   Taking medication for chronic disease 03/18/2019   Numbness and  tingling of right upper extremity 08/15/2016   Lipoma of right upper extremity 08/15/2016   Cerebral microvascular disease 07/23/2015   Familial multiple lipoprotein-type hyperlipidemia 02/16/2015   Anxiety attack 02/16/2015   Recurrent major depressive episodes (HCC) 02/16/2015   Essential (primary) hypertension 02/16/2015   H/O: HTN (hypertension) 02/16/2015   Asymptomatic hypertensive urgency 11/14/2014   Tubulovillous adenoma of rectum 10/21/2014   Depression 10/21/2014   Nuclear sclerosis of both eyes 07/07/2014    Allergies  Allergen Reactions   Amoxicillin Hives   Empagliflozin Other (See Comments)    35 lb wt loss   Donepezil Diarrhea   Nicardipine Hcl In Dextrose Other (See Comments)    Patient vasovagal response on low dose cardene   Penicillins Hives    Past Surgical History:  Procedure Laterality Date   CHOLECYSTECTOMY  2009   COLONOSCOPY     RECTAL POLYPECTOMY     benign    Social History   Tobacco Use   Smoking status: Former    Current packs/day: 0.00    Average packs/day: 1 pack/day for 20.0 years (20.0 ttl pk-yrs)    Types: Cigarettes    Start date: 69    Quit date: 62    Years since quitting: 58.2   Smokeless tobacco: Never  Vaping Use   Vaping status: Never Used  Substance Use Topics   Alcohol use: Yes    Alcohol/week: 0.0 standard drinks of alcohol    Comment: special occasions   Drug use: No     Medication list has been reviewed and updated.  Current Meds  Medication Sig   amLODipine (NORVASC) 10 MG tablet Take 1 tablet (10 mg total) by mouth daily.   apixaban (ELIQUIS) 2.5 MG TABS tablet Take 1 tablet (2.5 mg total) by mouth 2 (two) times daily.   fexofenadine (ALLEGRA) 180 MG tablet Take 1 tablet (180 mg total) by mouth daily. otc   fluticasone (FLONASE) 50 MCG/ACT nasal spray Place 1 spray into both nostrils daily. otc   hydrALAZINE (APRESOLINE) 50 MG tablet Take 1 tablet (50 mg total) by mouth every 8 (eight) hours.    loperamide (IMODIUM A-D) 2 MG tablet Take 2 mg by mouth as needed for diarrhea or loose stools.   lovastatin (MEVACOR) 20 MG tablet TAKE 2 TABLETS EVERY DAY (Patient taking differently: Take 20 mg by mouth daily with supper. TAKE 2 TABLETS EVERY DAY)   prazosin (MINIPRESS) 1 MG capsule Take 1mg  nightly for the first week, then increase to 2mg  nightly if top BP number still around 150-160 and you are not getting dizzy during the day.   telmisartan (MICARDIS) 80 MG tablet Take by mouth.   torsemide (DEMADEX) 20 MG tablet Take 40 mg by mouth daily. For 30 days   venlafaxine XR (EFFEXOR-XR) 75 MG 24 hr capsule Take 1 capsule (75 mg total) by mouth daily.       01/22/2024   11:03 AM 08/06/2023    9:59 AM 02/02/2023   10:14 AM 08/02/2022   11:03 AM  GAD 7 : Generalized Anxiety Score  Nervous, Anxious, on Edge 1 0 0 0  Control/stop worrying 0 1 0  0  Worry too much - different things 1 0 0 0  Trouble relaxing 0 0 0 0  Restless 0 0 0 0  Easily annoyed or irritable 0 0 0 0  Afraid - awful might happen 1 1 0 0  Total GAD 7 Score 3 2 0 0  Anxiety Difficulty Not difficult at all Not difficult at all Not difficult at all Not difficult at all       01/22/2024   11:03 AM 08/06/2023    9:59 AM 05/09/2023    1:41 PM  Depression screen PHQ 2/9  Decreased Interest 0 0 0  Down, Depressed, Hopeless 1 1 0  PHQ - 2 Score 1 1 0  Altered sleeping 0 1 0  Tired, decreased energy 2 1 0  Change in appetite 0 0 0  Feeling bad or failure about yourself  0 0 0  Trouble concentrating 1 1 0  Moving slowly or fidgety/restless 0 1 0  Suicidal thoughts 0 0 0  PHQ-9 Score 4 5 0  Difficult doing work/chores Not difficult at all Not difficult at all Not difficult at all    BP Readings from Last 3 Encounters:  01/22/24 128/70  10/30/23 (!) 154/54  08/06/23 124/62    Physical Exam Vitals and nursing note reviewed.  Constitutional:      General: She is not irritable. HENT:     Head: Normocephalic.     Right  Ear: Tympanic membrane normal.     Left Ear: Tympanic membrane normal.  Cardiovascular:     Rate and Rhythm: Normal rate and regular rhythm.     Pulses: Normal pulses.     Heart sounds: No murmur heard.    No gallop.  Pulmonary:     Breath sounds: No wheezing, rhonchi or rales.  Abdominal:     Palpations: Abdomen is soft.     Tenderness: There is no abdominal tenderness.  Musculoskeletal:     Cervical back: Neck supple.     Wt Readings from Last 3 Encounters:  01/22/24 160 lb (72.6 kg)  08/06/23 164 lb 3.2 oz (74.5 kg)  05/09/23 174 lb (78.9 kg)    BP 128/70   Pulse 79   Ht 5\' 7"  (1.702 m)   Wt 160 lb (72.6 kg)   SpO2 97%   BMI 25.06 kg/m   Assessment and Plan: 1. Dysuria (Primary) New onset.  Persistent.  3 days of dysuria with odor.  I suspect that there is an underlying UTI with patient having dysuria and urgency without frequency.  We will obtain a home specimen under as sterile as possible circumstances and transfer to a sterile container and sent for culture that patient's family will bring to the clinic.  In the meantime we will initiate Septra DS twice a day for 7 days. - Urine Culture; Future  2. Recurrent major depressive episodes (HCC) Chronic.  Controlled.  Stable.  PHQ is 4 GAD score is 3 patient will continue venlafaxine XR 75 mg daily.  Will refer patient to geriatrics for follow-up of primary care in which time venlafaxine or physicians choice otherwise will be taken over for maintenance of depression controlled. - venlafaxine XR (EFFEXOR-XR) 75 MG 24 hr capsule; Take 1 capsule (75 mg total) by mouth daily.  Dispense: 90 capsule; Refill: 1     Elizabeth Sauer, MD

## 2024-01-25 ENCOUNTER — Encounter: Payer: Self-pay | Admitting: Family Medicine

## 2024-01-25 LAB — URINE CULTURE

## 2024-01-26 ENCOUNTER — Other Ambulatory Visit: Payer: Self-pay | Admitting: Family Medicine

## 2024-01-26 MED ORDER — NITROFURANTOIN MONOHYD MACRO 100 MG PO CAPS: 100.0000 mg | ORAL_CAPSULE | Freq: Two times a day (BID) | ORAL | 0 refills | Status: AC

## 2024-02-01 ENCOUNTER — Telehealth: Payer: Self-pay

## 2024-02-01 NOTE — Transitions of Care (Post Inpatient/ED Visit) (Signed)
   02/01/2024  Name: Karina Leonard MRN: [id number] DOB: [birth year]  Today's TOC FU Call Status: Today's TOC FU Call Status:: Unsuccessful Call (1st Attempt) Unsuccessful Call (1st Attempt) Date: 02/01/24  Attempted to reach the patient regarding the most recent Inpatient/ED visit.  Follow Up Plan: Additional outreach attempts will be made to reach the patient to complete the Transitions of Care (Post Inpatient/ED visit) call.   Bary Leriche RN, MSN [facility], [facility] RN Care Manager Direct Dial: [phone]  Fax: [phone] Website: [email or web address]

## 2024-02-04 ENCOUNTER — Telehealth: Payer: Self-pay | Admitting: *Deleted

## 2024-02-04 NOTE — Transitions of Care (Post Inpatient/ED Visit) (Signed)
 02/04/2024  Name: Karina Leonard MRN: [id number] DOB: 1940-11-26  Today's TOC FU Call Status: Today's TOC FU Call Status:: Successful TOC FU Call Completed TOC FU Call Complete Date: 02/04/24 Patient's Name and Date of Birth confirmed.  Transition Care Management Follow-up Telephone Call Date of Discharge: 01/31/24 Discharge Facility: Other ([facility]) Name of Other ([facility]) Discharge Facility: [facility] Type of Discharge: Inpatient Admission Primary Inpatient Discharge Diagnosis:: GI bleeding; melena; syncope How have you been since you were released from the hospital?: Better (per reported caregiver Joni Reining: "She is better, we need to get that release signed and will do that when we see her PCP on Thursday.  No problems so far") Any questions or concerns?: Yes Patient Questions/Concerns:: per Joni Reining: "Is she were to need more blood when Dr. Yetta Barre sees her on Thursday- how will the doctor make that happen? Will we have to take her back to the ER?" Patient Questions/Concerns Addressed: Other: (provided education that PCP could request blood tranfusion as outpatient; also explained that if there are additional concerns with patient's clinical condition- provider may advise ED visit, if indicated clinically)  Items Reviewed: Did you receive and understand the discharge instructions provided?: Yes Medications obtained,verified, and reconciled?: No Medications Not Reviewed Reasons:: Other: (unable to verify that reported caregiver on [facility] call is on DPR: No DPR on file; purpose of/ and importance of having updated DPR completed/ provided education around process of updating DPR encouraged her to update at upcoming PCP appointment 02/07/24)  Medications Reviewed Today: Medications Reviewed Today     Reviewed by Michaela Corner, RN (Registered Nurse) on 02/04/24 at 1536  Med List Status: <None>   Medication Order Taking? Sig Documenting Provider Last Dose Status Informant  acetaminophen  (TYLENOL) 325 MG tablet [id number] No Take 325 mg by mouth 2 (two) times daily. [provider] Taking Active            Med Note Danae Chen May 09, 2023  1:34 PM) Takes PRN  amLODipine (NORVASC) 10 MG tablet [id number] No Take 1 tablet (10 mg total) by mouth daily. Charise Killian, MD Taking Active   apixaban [facility]) 2.5 MG TABS tablet [id number] No Take 1 tablet (2.5 mg total) by mouth 2 (two) times daily. Charise Killian, MD Taking Active   aspirin EC 81 MG tablet [id number] No Take 81 mg by mouth daily. Take while off of the plavix for surgey [provider] Taking Active Self  cloNIDine (CATAPRES - DOSED IN MG/24 HR) 0.2 mg/24hr patch [id number] No Place 0.2 mg onto the skin once a week. Sunday [provider] Taking Active Self  fexofenadine (ALLEGRA) 180 MG tablet [id number] No Take 1 tablet (180 mg total) by mouth daily. otc Duanne Limerick, MD Taking Active Self  fluticasone (FLONASE) 50 MCG/ACT nasal spray [id number] No Place 1 spray into both nostrils daily. otc [provider] Taking Active Self  hydrALAZINE (APRESOLINE) 50 MG tablet [id number] No Take 1 tablet (50 mg total) by mouth every 8 (eight) hours. Charise Killian, MD Taking Active            Med Note Danae Chen May 09, 2023  1:36 PM) Takes one time per day at lunchtime  loperamide (IMODIUM A-D) 2 MG tablet [id number] No Take 2 mg by mouth as needed for diarrhea or loose stools. [provider] Taking Active   lovastatin (MEVACOR) 20 MG tablet [id number] No TAKE 2 TABLETS EVERY  DAY  Patient taking differently: Take 20 mg by mouth daily with supper. TAKE 2 TABLETS EVERY DAY   Jones, Deanna C, MD Taking Active Self  prazosin (MINIPRESS) 1 MG capsule [id number]  Take 1mg  nightly for the first week, then increase to 2mg  nightly if top BP number still around 150-160 and you are not getting dizzy during the day. [provider]  Active   telmisartan  (MICARDIS) 80 MG tablet [id number]  Take by mouth. [provider]  Active   torsemide (DEMADEX) 20 MG tablet [id number] No Take 40 mg by mouth daily. For 30 days [provider] Taking Active   venlafaxine XR (EFFEXOR-XR) 75 MG 24 hr capsule [id number]  Take 1 capsule (75 mg total) by mouth daily. Clarise Crooks, MD  Active            Home Care and Equipment/Supplies:    Functional Questionnaire:    Follow up appointments reviewed: PCP Follow-up appointment confirmed?: Yes Date of PCP follow-up appointment?: 02/07/24 Follow-up Provider: PCP: Dr. Rochelle Chu Specialist [facility] Follow-up appointment confirmed?: NA Do you need transportation to your follow-up appointment?: No Do you understand care options if your condition(s) worsen?: Yes-patient verbalized understanding  SDOH Interventions Today    Flowsheet Row Most Recent Value  SDOH Interventions   Transportation Interventions Intervention Not Indicated  [reported caregiver during [facility] call not verified on DPR- modified TOC call completed accordingly: independently offers/ reports family provides transportation]      Total time spent from review to signing of note/ including any care coordination interventions:  35 minutes: review of outside hospital visit; call with reported caregiver/ documentation  Pls call/ message for questions,  Erlene Hawks, RN, BSN, CCRN Alumnus RN Care Manager  Transitions of Care  [facility] - [facility] [phone]: direct office

## 2024-02-07 ENCOUNTER — Other Ambulatory Visit
Admission: RE | Admit: 2024-02-07 | Discharge: 2024-02-07 | Disposition: A | Discharge status: Home / Self Care | Attending: Family Medicine | Admitting: Family Medicine

## 2024-02-07 ENCOUNTER — Encounter: Payer: Self-pay | Admitting: Family Medicine

## 2024-02-07 ENCOUNTER — Ambulatory Visit (INDEPENDENT_AMBULATORY_CARE_PROVIDER_SITE_OTHER): Admitting: Family Medicine

## 2024-02-07 VITALS — BP 132/70 | HR 74 | Ht 67.0 in | Wt 153.0 lb

## 2024-02-07 DIAGNOSIS — D649 Anemia, unspecified: Secondary | ICD-10-CM | POA: Insufficient documentation

## 2024-02-07 LAB — CBC WITH DIFFERENTIAL/PLATELET
Abs Immature Granulocytes: 0.05 10*3/uL (ref 0.00–0.07)
Basophils Absolute: 0.1 10*3/uL (ref 0.0–0.1)
Basophils Relative: 1 %
Eosinophils Absolute: 0 10*3/uL (ref 0.0–0.5)
Eosinophils Relative: 0 %
HCT: 27.7 % — ABNORMAL LOW (ref 36.0–46.0)
Hemoglobin: 8.9 g/dL — ABNORMAL LOW (ref 12.0–15.0)
Immature Granulocytes: 0 %
Lymphocytes Relative: 8 %
Lymphs Abs: 1.1 10*3/uL (ref 0.7–4.0)
MCH: 30.4 pg (ref 26.0–34.0)
MCHC: 32.1 g/dL (ref 30.0–36.0)
MCV: 94.5 fL (ref 80.0–100.0)
Monocytes Absolute: 0.5 10*3/uL (ref 0.1–1.0)
Monocytes Relative: 3 %
Neutro Abs: 12.6 10*3/uL — ABNORMAL HIGH (ref 1.7–7.7)
Neutrophils Relative %: 88 %
Platelets: 311 10*3/uL (ref 150–400)
RBC: 2.93 MIL/uL — ABNORMAL LOW (ref 3.87–5.11)
RDW: 15.1 % (ref 11.5–15.5)
WBC: 14.3 10*3/uL — ABNORMAL HIGH (ref 4.0–10.5)
nRBC: 0 % (ref 0.0–0.2)

## 2024-02-07 NOTE — Progress Notes (Signed)
 Date:  02/07/2024   Name:  Karina Leonard   DOB:  February 20, 1941   MRN:  [id number]   Chief Complaint: Hospitalization Follow-up  Anemia Presents for follow-up visit. Symptoms include light-headedness and malaise/fatigue. There has been no abdominal pain, bruising/bleeding easily, palpitations or weight loss. Signs of blood loss that are present include melena. Signs of blood loss that are not present include hematemesis, hematochezia, menorrhagia and vaginal bleeding. (resolved)  There are no compliance problems.     Lab Results  Component Value Date   NA 137 03/22/2023   K 4.0 03/22/2023   CO2 21 (L) 03/22/2023   GLUCOSE 106 (H) 03/22/2023   BUN 60 (H) 03/22/2023   CREATININE 2.19 (H) 03/22/2023   CALCIUM 8.2 (L) 03/22/2023   EGFR 43 (L) 02/02/2023   GFRNONAA 22 (L) 03/22/2023   Lab Results  Component Value Date   CHOL 113 03/13/2023   HDL 39 (L) 03/13/2023   LDLCALC 59 03/13/2023   TRIG 74 03/13/2023   CHOLHDL 2.9 03/13/2023   Lab Results  Component Value Date   TSH 3.217 03/08/2023   Lab Results  Component Value Date   HGBA1C 5.7 (H) 03/08/2023   Lab Results  Component Value Date   WBC 13.8 (H) 03/22/2023   HGB 9.5 (L) 03/22/2023   HCT 30.1 (L) 03/22/2023   MCV 94.7 03/22/2023   PLT 295 03/22/2023   Lab Results  Component Value Date   ALT 37 03/09/2023   AST 82 (H) 03/09/2023   ALKPHOS 84 03/09/2023   BILITOT 0.9 03/09/2023   No results found for: "25OHVITD2", "25OHVITD3", "VD25OH"   Review of Systems  Constitutional:  Positive for malaise/fatigue. Negative for weight loss.  Respiratory:  Negative for chest tightness, shortness of breath and wheezing.   Cardiovascular:  Negative for chest pain, palpitations and leg swelling.  Gastrointestinal:  Positive for constipation and melena. Negative for abdominal distention, abdominal pain, blood in stool, diarrhea, hematemesis and hematochezia.  Endocrine: Negative for polydipsia and polyuria.   Genitourinary:  Negative for difficulty urinating, menorrhagia and vaginal bleeding.  Neurological:  Positive for dizziness and light-headedness. Negative for speech difficulty, weakness and numbness.  Hematological:  Negative for adenopathy. Does not bruise/bleed easily.    Patient Active Problem List   Diagnosis Date Noted   Acute CVA (cerebrovascular accident) (HCC) 03/14/2023   AKI (acute kidney injury) (HCC) 03/14/2023   Malignant hypertension 03/08/2023   Carotid stenosis 02/09/2023   Diabetes mellitus without complication (HCC) 02/09/2023   Urinary incontinence 07/19/2021   Need for vaccination 03/18/2019   H/O hypercholesterolemia 03/18/2019   Drug-induced obesity 03/18/2019   Coronary artery disease 03/18/2019   Cataract cortical, senile 03/18/2019   Taking medication for chronic disease 03/18/2019   Numbness and tingling of right upper extremity 08/15/2016   Lipoma of right upper extremity 08/15/2016   Cerebral microvascular disease 07/23/2015   Familial multiple lipoprotein-type hyperlipidemia 02/16/2015   Anxiety attack 02/16/2015   Recurrent major depressive episodes (HCC) 02/16/2015   Essential (primary) hypertension 02/16/2015   H/O: HTN (hypertension) 02/16/2015   Asymptomatic hypertensive urgency 11/14/2014   Tubulovillous adenoma of rectum 10/21/2014   Depression 10/21/2014   Nuclear sclerosis of both eyes 07/07/2014    Allergies  Allergen Reactions   Amoxicillin Hives   Empagliflozin Other (See Comments)    35 lb wt loss   Donepezil Diarrhea   Nicardipine Hcl In Dextrose Other (See Comments)    Patient vasovagal response on low dose cardene  Penicillins Hives    Past Surgical History:  Procedure Laterality Date   CHOLECYSTECTOMY  2009   COLONOSCOPY     RECTAL POLYPECTOMY     benign    Social History   Tobacco Use   Smoking status: Former    Current packs/day: 0.00    Average packs/day: 1 pack/day for 20.0 years (20.0 ttl pk-yrs)     Types: Cigarettes    Start date: 57    Quit date: 62    Years since quitting: 58.3   Smokeless tobacco: Never  Vaping Use   Vaping status: Never Used  Substance Use Topics   Alcohol use: Yes    Alcohol/week: 0.0 standard drinks of alcohol    Comment: special occasions   Drug use: No     Medication list has been reviewed and updated.  Current Meds  Medication Sig   acetaminophen (TYLENOL) 325 MG tablet Take 325 mg by mouth 2 (two) times daily.   amLODipine (NORVASC) 10 MG tablet Take 1 tablet (10 mg total) by mouth daily.   apixaban (ELIQUIS) 2.5 MG TABS tablet Take 1 tablet (2.5 mg total) by mouth 2 (two) times daily.   aspirin EC 81 MG tablet Take 81 mg by mouth daily. Take while off of the plavix for surgey   cloNIDine (CATAPRES - DOSED IN MG/24 HR) 0.2 mg/24hr patch Place 0.2 mg onto the skin once a week. Sunday   fexofenadine (ALLEGRA) 180 MG tablet Take 1 tablet (180 mg total) by mouth daily. otc   fluticasone (FLONASE) 50 MCG/ACT nasal spray Place 1 spray into both nostrils daily. otc   hydrALAZINE (APRESOLINE) 50 MG tablet Take 1 tablet (50 mg total) by mouth every 8 (eight) hours.   lovastatin (MEVACOR) 20 MG tablet TAKE 2 TABLETS EVERY DAY (Patient taking differently: Take 20 mg by mouth daily with supper. TAKE 2 TABLETS EVERY DAY)   pantoprazole (PROTONIX) 40 MG tablet Take 80 mg by mouth 2 (two) times daily.   polyethylene glycol (MIRALAX / GLYCOLAX) 17 g packet Take 17 g by mouth once.   prazosin (MINIPRESS) 1 MG capsule Take 1mg  nightly for the first week, then increase to 2mg  nightly if top BP number still around 150-160 and you are not getting dizzy during the day.   telmisartan (MICARDIS) 80 MG tablet Take by mouth.   torsemide (DEMADEX) 20 MG tablet Take 40 mg by mouth daily. For 30 days   venlafaxine XR (EFFEXOR-XR) 75 MG 24 hr capsule Take 1 capsule (75 mg total) by mouth daily.       02/07/2024    1:26 PM 01/22/2024   11:03 AM 08/06/2023    9:59 AM  02/02/2023   10:14 AM  GAD 7 : Generalized Anxiety Score  Nervous, Anxious, on Edge 0 1 0 0  Control/stop worrying 0 0 1 0  Worry too much - different things 0 1 0 0  Trouble relaxing  0 0 0  Restless  0 0 0  Easily annoyed or irritable  0 0 0  Afraid - awful might happen  1 1 0  Total GAD 7 Score  3 2 0  Anxiety Difficulty  Not difficult at all Not difficult at all Not difficult at all       02/07/2024    1:26 PM 01/22/2024   11:03 AM 08/06/2023    9:59 AM  Depression screen PHQ 2/9  Decreased Interest 1 0 0  Down, Depressed, Hopeless 0 1 1  PHQ -  2 Score 1 1 1   Altered sleeping 1 0 1  Tired, decreased energy 1 2 1   Change in appetite 0 0 0  Feeling bad or failure about yourself  2 0 0  Trouble concentrating 2 1 1   Moving slowly or fidgety/restless 1 0 1  Suicidal thoughts 0 0 0  PHQ-9 Score 8 4 5   Difficult doing work/chores Somewhat difficult Not difficult at all Not difficult at all    BP Readings from Last 3 Encounters:  02/07/24 132/70  01/22/24 128/70  10/30/23 (!) 154/54    Physical Exam Vitals and nursing note reviewed.  HENT:     Right Ear: Tympanic membrane normal.     Left Ear: Tympanic membrane normal.     Mouth/Throat:     Mouth: Mucous membranes are moist.  Eyes:     Pupils: Pupils are equal, round, and reactive to light.  Cardiovascular:     Rate and Rhythm: Normal rate.     Heart sounds: No murmur heard.    No friction rub. No gallop.  Pulmonary:     Breath sounds: No wheezing, rhonchi or rales.  Abdominal:     Tenderness: There is no abdominal tenderness. There is no guarding.  Musculoskeletal:     Cervical back: Neck supple.  Neurological:     Mental Status: She is alert.     Wt Readings from Last 3 Encounters:  02/07/24 153 lb (69.4 kg)  01/22/24 160 lb (72.6 kg)  08/06/23 164 lb 3.2 oz (74.5 kg)    BP 132/70   Pulse 74   Ht 5\' 7"  (1.702 m)   Wt 153 lb (69.4 kg)   SpO2 99%   BMI 23.96 kg/m   Assessment and Plan:  1.  Anemia, unspecified type (Primary) New onset.  Resolving.  Relatively stable from initial presentation but still having some issues with orthostatic presyncopal symptomatology.  Evaluation of blood pressure is noted to be 132/70 however this is the best that we have had in her and it may be contributing to her orthostatic symptomatology.  Patient does not have any tachycardia. - CBC with Differential/Platelet    Alayne Allis, MD

## 2024-03-11 ENCOUNTER — Encounter (INDEPENDENT_AMBULATORY_CARE_PROVIDER_SITE_OTHER): Payer: Self-pay

## 2024-03-26 ENCOUNTER — Telehealth: Payer: Self-pay

## 2024-03-26 NOTE — Telephone Encounter (Signed)
 Spoke with daughter of patient Karina Leonard) she stated that she is making arrangements to see another pcp. If all fails she will call back to schedule for medication refills.

## 2024-03-26 NOTE — Telephone Encounter (Signed)
 Please call patient to schedule appt with new provide to avoid interruption in care and medication refills.  Thank you.

## 2024-05-14 ENCOUNTER — Ambulatory Visit

## 2024-05-15 ENCOUNTER — Ambulatory Visit
